# Patient Record
Sex: Female | Born: 1985 | Race: White | Hispanic: No | Marital: Married
Health system: Southern US, Academic
[De-identification: ages and names within clinical notes are randomized; demographics above are authoritative.]

## PROBLEM LIST (undated history)

## (undated) ENCOUNTER — Ambulatory Visit

## (undated) ENCOUNTER — Telehealth

## (undated) ENCOUNTER — Ambulatory Visit
Payer: BLUE CROSS/BLUE SHIELD | Attending: Student in an Organized Health Care Education/Training Program | Primary: Student in an Organized Health Care Education/Training Program

## (undated) ENCOUNTER — Ambulatory Visit: Payer: MEDICARE

## (undated) ENCOUNTER — Encounter
Attending: Student in an Organized Health Care Education/Training Program | Primary: Student in an Organized Health Care Education/Training Program

## (undated) ENCOUNTER — Encounter: Attending: Surgical | Primary: Surgical

## (undated) ENCOUNTER — Encounter

## (undated) ENCOUNTER — Ambulatory Visit: Payer: BLUE CROSS/BLUE SHIELD

## (undated) ENCOUNTER — Ambulatory Visit: Payer: PRIVATE HEALTH INSURANCE

## (undated) ENCOUNTER — Telehealth
Attending: Student in an Organized Health Care Education/Training Program | Primary: Student in an Organized Health Care Education/Training Program

## (undated) ENCOUNTER — Encounter: Attending: Anesthesiology | Primary: Anesthesiology

## (undated) ENCOUNTER — Encounter: Attending: Neurological Surgery | Primary: Neurological Surgery

## (undated) ENCOUNTER — Encounter
Attending: Rehabilitative and Restorative Service Providers" | Primary: Rehabilitative and Restorative Service Providers"

## (undated) ENCOUNTER — Encounter: Attending: Internal Medicine | Primary: Internal Medicine

## (undated) ENCOUNTER — Encounter: Attending: Family | Primary: Family

## (undated) ENCOUNTER — Encounter: Payer: PRIVATE HEALTH INSURANCE | Attending: Internal Medicine | Primary: Internal Medicine

## (undated) ENCOUNTER — Ambulatory Visit: Payer: MEDICARE | Attending: Anesthesiology | Primary: Anesthesiology

## (undated) ENCOUNTER — Ambulatory Visit: Payer: PRIVATE HEALTH INSURANCE | Attending: Family | Primary: Family

## (undated) ENCOUNTER — Encounter: Payer: PRIVATE HEALTH INSURANCE | Attending: Physician Assistant | Primary: Physician Assistant

## (undated) ENCOUNTER — Encounter: Attending: Specialist | Primary: Specialist

## (undated) ENCOUNTER — Encounter: Attending: Psychologist | Primary: Psychologist

## (undated) ENCOUNTER — Ambulatory Visit: Payer: PRIVATE HEALTH INSURANCE | Attending: Orthopaedic Surgery | Primary: Orthopaedic Surgery

## (undated) ENCOUNTER — Inpatient Hospital Stay: Payer: BLUE CROSS/BLUE SHIELD

## (undated) ENCOUNTER — Ambulatory Visit
Payer: PRIVATE HEALTH INSURANCE | Attending: Student in an Organized Health Care Education/Training Program | Primary: Student in an Organized Health Care Education/Training Program

## (undated) ENCOUNTER — Ambulatory Visit: Payer: MEDICARE | Attending: Psychologist | Primary: Psychologist

## (undated) ENCOUNTER — Ambulatory Visit: Payer: MEDICARE | Attending: Specialist | Primary: Specialist

## (undated) ENCOUNTER — Ambulatory Visit
Payer: PRIVATE HEALTH INSURANCE | Attending: Rehabilitative and Restorative Service Providers" | Primary: Rehabilitative and Restorative Service Providers"

## (undated) ENCOUNTER — Ambulatory Visit
Payer: Medicaid (Managed Care) | Attending: Physical Medicine & Rehabilitation | Primary: Physical Medicine & Rehabilitation

## (undated) ENCOUNTER — Ambulatory Visit: Payer: Medicaid (Managed Care)

## (undated) ENCOUNTER — Ambulatory Visit: Payer: BLUE CROSS/BLUE SHIELD | Attending: Specialist | Primary: Specialist

## (undated) ENCOUNTER — Telehealth: Attending: Specialist | Primary: Specialist

## (undated) ENCOUNTER — Ambulatory Visit: Payer: MEDICARE | Attending: Plastic and Reconstructive Surgery | Primary: Plastic and Reconstructive Surgery

## (undated) ENCOUNTER — Ambulatory Visit
Attending: Student in an Organized Health Care Education/Training Program | Primary: Student in an Organized Health Care Education/Training Program

## (undated) ENCOUNTER — Telehealth: Attending: Rheumatology | Primary: Rheumatology

## (undated) ENCOUNTER — Ambulatory Visit: Payer: MEDICARE | Attending: Family | Primary: Family

## (undated) ENCOUNTER — Encounter: Payer: PRIVATE HEALTH INSURANCE | Attending: Dermatology | Primary: Dermatology

## (undated) ENCOUNTER — Non-Acute Institutional Stay
Payer: PRIVATE HEALTH INSURANCE | Attending: Plastic and Reconstructive Surgery | Primary: Plastic and Reconstructive Surgery

## (undated) ENCOUNTER — Ambulatory Visit: Attending: Physical Medicine & Rehabilitation | Primary: Physical Medicine & Rehabilitation

## (undated) ENCOUNTER — Encounter: Payer: PRIVATE HEALTH INSURANCE | Attending: Otolaryngology | Primary: Otolaryngology

## (undated) ENCOUNTER — Encounter
Payer: PRIVATE HEALTH INSURANCE | Attending: Plastic and Reconstructive Surgery | Primary: Plastic and Reconstructive Surgery

## (undated) ENCOUNTER — Encounter
Payer: PRIVATE HEALTH INSURANCE | Attending: Rehabilitative and Restorative Service Providers" | Primary: Rehabilitative and Restorative Service Providers"

## (undated) ENCOUNTER — Telehealth: Attending: Surgical | Primary: Surgical

## (undated) ENCOUNTER — Encounter: Attending: Nurse Practitioner | Primary: Nurse Practitioner

## (undated) ENCOUNTER — Encounter: Attending: Urology | Primary: Urology

## (undated) DIAGNOSIS — N814 Uterovaginal prolapse, unspecified: Secondary | ICD-10-CM

## (undated) DIAGNOSIS — Z3490 Encounter for supervision of normal pregnancy, unspecified, unspecified trimester: Principal | ICD-10-CM

## (undated) DIAGNOSIS — I499 Cardiac arrhythmia, unspecified: Secondary | ICD-10-CM

## (undated) DIAGNOSIS — I471 Supraventricular tachycardia, unspecified: Secondary | ICD-10-CM

## (undated) DIAGNOSIS — E039 Hypothyroidism, unspecified: Secondary | ICD-10-CM

## (undated) DIAGNOSIS — D8940 Mast cell activation, unspecified: Secondary | ICD-10-CM

## (undated) DIAGNOSIS — Q796 Ehlers-Danlos syndrome, unspecified: Secondary | ICD-10-CM

## (undated) DIAGNOSIS — G90A Postural orthostatic tachycardia syndrome (POTS): Secondary | ICD-10-CM

## (undated) DIAGNOSIS — L4050 Arthropathic psoriasis, unspecified: Secondary | ICD-10-CM

## (undated) DIAGNOSIS — J189 Pneumonia, unspecified organism: Secondary | ICD-10-CM

## (undated) DIAGNOSIS — N368 Other specified disorders of urethra: Secondary | ICD-10-CM

## (undated) DIAGNOSIS — R102 Pelvic and perineal pain: Principal | ICD-10-CM

## (undated) DIAGNOSIS — G540 Brachial plexus disorders: Secondary | ICD-10-CM

## (undated) DIAGNOSIS — F32A Depression, unspecified: Secondary | ICD-10-CM

## (undated) DIAGNOSIS — R87629 Unspecified abnormal cytological findings in specimens from vagina: Secondary | ICD-10-CM

## (undated) DIAGNOSIS — M1990 Unspecified osteoarthritis, unspecified site: Secondary | ICD-10-CM

## (undated) DIAGNOSIS — E063 Autoimmune thyroiditis: Secondary | ICD-10-CM

## (undated) DIAGNOSIS — N816 Rectocele: Secondary | ICD-10-CM

## (undated) DIAGNOSIS — G43909 Migraine, unspecified, not intractable, without status migrainosus: Secondary | ICD-10-CM

## (undated) DIAGNOSIS — T8859XA Other complications of anesthesia, initial encounter: Secondary | ICD-10-CM

## (undated) DIAGNOSIS — N8110 Cystocele, unspecified: Secondary | ICD-10-CM

## (undated) DIAGNOSIS — J309 Allergic rhinitis, unspecified: Secondary | ICD-10-CM

## (undated) DIAGNOSIS — R1310 Dysphagia, unspecified: Secondary | ICD-10-CM

## (undated) DIAGNOSIS — J45909 Unspecified asthma, uncomplicated: Secondary | ICD-10-CM

## (undated) DIAGNOSIS — E079 Disorder of thyroid, unspecified: Secondary | ICD-10-CM

## (undated) DIAGNOSIS — O26892 Other specified pregnancy related conditions, second trimester: Principal | ICD-10-CM

## (undated) DIAGNOSIS — K2970 Gastritis, unspecified, without bleeding: Secondary | ICD-10-CM

## (undated) DIAGNOSIS — D649 Anemia, unspecified: Secondary | ICD-10-CM

## (undated) DIAGNOSIS — IMO0002 Reserved for concepts with insufficient information to code with codable children: Secondary | ICD-10-CM

## (undated) DIAGNOSIS — K219 Gastro-esophageal reflux disease without esophagitis: Secondary | ICD-10-CM

## (undated) DIAGNOSIS — M3500 Sicca syndrome, unspecified: Secondary | ICD-10-CM

## (undated) DIAGNOSIS — M329 Systemic lupus erythematosus, unspecified: Secondary | ICD-10-CM

## (undated) HISTORY — DX: Cystocele, unspecified: N81.10

## (undated) HISTORY — DX: Encounter for supervision of normal pregnancy, unspecified, unspecified trimester: Z34.90

## (undated) HISTORY — DX: Unspecified asthma, uncomplicated: J45.909

## (undated) HISTORY — DX: Supraventricular tachycardia: I47.1

## (undated) HISTORY — DX: Other specified pregnancy related conditions, second trimester: O26.892

## (undated) HISTORY — DX: Rectocele: N81.6

## (undated) HISTORY — PX: ANTERIOR AND POSTERIOR VAGINAL REPAIR: SUR5

## (undated) HISTORY — DX: Other specified disorders of urethra: N36.8

## (undated) HISTORY — PX: ABDOMINAL HYSTERECTOMY: SHX81

## (undated) HISTORY — DX: Allergic rhinitis, unspecified: J30.9

## (undated) HISTORY — PX: WRIST GANGLION EXCISION: SUR520

## (undated) HISTORY — DX: Dysphagia, unspecified: R13.10

## (undated) HISTORY — DX: Uterovaginal prolapse, unspecified: N81.4

## (undated) HISTORY — DX: Supraventricular tachycardia, unspecified: I47.10

## (undated) HISTORY — PX: ANKLE SURGERY: SHX546

## (undated) HISTORY — DX: Unspecified abnormal cytological findings in specimens from vagina: R87.629

## (undated) HISTORY — DX: Pelvic and perineal pain: R10.2

## (undated) MED ORDER — CLOBETASOL PROPIONATE (BULK) MISC: 0 days

## (undated) MED ORDER — FOLIC ACID 1 MG TABLET: 0 days

## (undated) MED ORDER — BUPROPION HCL XL 150 MG 24 HR TABLET, EXTENDED RELEASE: Freq: Every day | ORAL | 0.00000 days

## (undated) MED ORDER — ALBUTEROL INHL: 0 days

## (undated) MED ORDER — CHOLECALCIFEROL (VITAMIN D3) ORAL: 0 days

## (undated) MED ORDER — NAPROXEN 500 MG TABLET: Freq: Every day | ORAL | 0 days

## (undated) MED ORDER — METOCLOPRAMIDE HCL ORAL: 0 days

## (undated) MED ORDER — OMEPRAZOLE 20 MG CAPSULE,DELAYED RELEASE: Freq: Every day | ORAL | 0 days

## (undated) MED ORDER — CALCIUM CARB-VIT D3-MINERALS 600 MG CALCIUM-400 UNIT TABLET: 0 days

---

## 2012-01-28 ENCOUNTER — Encounter: Payer: Self-pay | Admitting: Internal Medicine

## 2013-07-30 ENCOUNTER — Ambulatory Visit (HOSPITAL_COMMUNITY): Payer: BC Managed Care – PPO | Admitting: Psychology

## 2013-10-15 ENCOUNTER — Other Ambulatory Visit: Payer: Self-pay | Admitting: Obstetrics & Gynecology

## 2013-10-15 DIAGNOSIS — O3680X0 Pregnancy with inconclusive fetal viability, not applicable or unspecified: Secondary | ICD-10-CM

## 2013-10-18 ENCOUNTER — Encounter: Payer: Self-pay | Admitting: Adult Health

## 2013-10-18 ENCOUNTER — Encounter: Payer: Self-pay | Admitting: Obstetrics & Gynecology

## 2013-10-18 ENCOUNTER — Ambulatory Visit (INDEPENDENT_AMBULATORY_CARE_PROVIDER_SITE_OTHER): Payer: BC Managed Care – PPO

## 2013-10-18 ENCOUNTER — Other Ambulatory Visit: Payer: Self-pay | Admitting: Obstetrics & Gynecology

## 2013-10-18 DIAGNOSIS — O3680X0 Pregnancy with inconclusive fetal viability, not applicable or unspecified: Secondary | ICD-10-CM

## 2013-10-18 DIAGNOSIS — O358XX0 Maternal care for other (suspected) fetal abnormality and damage, not applicable or unspecified: Secondary | ICD-10-CM

## 2013-10-18 NOTE — Progress Notes (Signed)
U/S-single IUP with +FCA noted FHR-102bpm, CRL c/w 5+4 wks EDD 06/16/2014, cx long and closed, bilateral adnexa wnl with C.L. Noted on LT

## 2013-10-19 ENCOUNTER — Other Ambulatory Visit: Payer: Self-pay

## 2013-10-20 ENCOUNTER — Other Ambulatory Visit: Payer: Self-pay

## 2013-10-25 ENCOUNTER — Other Ambulatory Visit: Payer: Self-pay | Admitting: Obstetrics & Gynecology

## 2013-10-25 DIAGNOSIS — O3680X0 Pregnancy with inconclusive fetal viability, not applicable or unspecified: Secondary | ICD-10-CM

## 2013-10-27 ENCOUNTER — Encounter: Payer: BC Managed Care – PPO | Admitting: Advanced Practice Midwife

## 2013-10-27 ENCOUNTER — Other Ambulatory Visit: Payer: BC Managed Care – PPO

## 2013-11-02 ENCOUNTER — Encounter: Payer: BC Managed Care – PPO | Admitting: Adult Health

## 2013-11-02 ENCOUNTER — Other Ambulatory Visit: Payer: BC Managed Care – PPO

## 2013-11-09 ENCOUNTER — Other Ambulatory Visit (HOSPITAL_COMMUNITY)
Admission: RE | Admit: 2013-11-09 | Discharge: 2013-11-09 | Disposition: A | Discharge status: Home / Self Care | Payer: BC Managed Care – PPO | Source: Ambulatory Visit | Attending: Obstetrics and Gynecology | Admitting: Obstetrics and Gynecology

## 2013-11-09 ENCOUNTER — Encounter: Payer: Self-pay | Admitting: Advanced Practice Midwife

## 2013-11-09 ENCOUNTER — Encounter (INDEPENDENT_AMBULATORY_CARE_PROVIDER_SITE_OTHER): Payer: Self-pay

## 2013-11-09 ENCOUNTER — Ambulatory Visit (INDEPENDENT_AMBULATORY_CARE_PROVIDER_SITE_OTHER): Payer: BC Managed Care – PPO

## 2013-11-09 ENCOUNTER — Ambulatory Visit (INDEPENDENT_AMBULATORY_CARE_PROVIDER_SITE_OTHER): Payer: BC Managed Care – PPO | Admitting: Advanced Practice Midwife

## 2013-11-09 VITALS — BP 114/76 | Ht 64.0 in | Wt 154.0 lb

## 2013-11-09 DIAGNOSIS — O09899 Supervision of other high risk pregnancies, unspecified trimester: Secondary | ICD-10-CM | POA: Insufficient documentation

## 2013-11-09 DIAGNOSIS — J45909 Unspecified asthma, uncomplicated: Secondary | ICD-10-CM

## 2013-11-09 DIAGNOSIS — Z01419 Encounter for gynecological examination (general) (routine) without abnormal findings: Secondary | ICD-10-CM | POA: Insufficient documentation

## 2013-11-09 DIAGNOSIS — I471 Supraventricular tachycardia, unspecified: Secondary | ICD-10-CM | POA: Insufficient documentation

## 2013-11-09 DIAGNOSIS — O3680X0 Pregnancy with inconclusive fetal viability, not applicable or unspecified: Secondary | ICD-10-CM

## 2013-11-09 DIAGNOSIS — Z3480 Encounter for supervision of other normal pregnancy, unspecified trimester: Secondary | ICD-10-CM

## 2013-11-09 DIAGNOSIS — Z331 Pregnant state, incidental: Secondary | ICD-10-CM

## 2013-11-09 DIAGNOSIS — Z1389 Encounter for screening for other disorder: Secondary | ICD-10-CM

## 2013-11-09 DIAGNOSIS — O360990 Maternal care for other rhesus isoimmunization, unspecified trimester, not applicable or unspecified: Secondary | ICD-10-CM

## 2013-11-09 DIAGNOSIS — Z3490 Encounter for supervision of normal pregnancy, unspecified, unspecified trimester: Secondary | ICD-10-CM

## 2013-11-09 LAB — POCT URINALYSIS DIPSTICK
Glucose, UA: NEGATIVE
Leukocytes, UA: NEGATIVE
Nitrite, UA: NEGATIVE
Protein, UA: NEGATIVE

## 2013-11-09 LAB — CBC
Hemoglobin: 12.8 g/dL (ref 12.0–15.0)
MCHC: 34.4 g/dL (ref 30.0–36.0)
Platelets: 204 10*3/uL (ref 150–400)
RBC: 4.66 MIL/uL (ref 3.87–5.11)
WBC: 6.3 10*3/uL (ref 4.0–10.5)

## 2013-11-09 LAB — RPR

## 2013-11-09 MED ORDER — LABETALOL HCL 100 MG PO TABS: 100.0000 mg | ORAL_TABLET | Freq: Two times a day (BID) | ORAL | Status: DC

## 2013-11-09 NOTE — Progress Notes (Signed)
Subjective:    Sharon Rowland is a [id number] [redacted]w[redacted]d being seen oday for her first obstetrical visit.  Pregnancy history fully reviewed. She states that with her last child, "he wasn't coming down" so forceps were used, and someone told her if he had been any bigger he wouldn't have come out.  No shoulder dystocia.    Patient reports backache. In her lower back.  She has been off her lopresser for SVT for a few weeks, and has daily runs ~ 170bpm. Filed Vitals:   11/09/13 1032 11/09/13 1035  BP: 114/76   Height:  5\' 4"  (1.626 m)  Weight: 154 lb (69.854 kg)     HISTORY: OB History  Gravida Para Term Preterm AB SAB TAB Ectopic Multiple Living  4 3 3       3     # Outcome Date GA Lbr Len/2nd Weight Sex Delivery Anes PTL Lv  4 CUR           3 TRM 12/19/06 [redacted]w[redacted]d  7 lb 8 oz (3.402 kg) M SVD EPI  Y     Comments: forceps "Told not to have a baby bigger than this"  2 TRM 10/02/05 [redacted]w[redacted]d  7 lb 2 oz (3.232 kg) M SVD EPI  Y  1 TRM 10/23/03 [redacted]w[redacted]d  6 lb 3 oz (2.807 kg) F SVD EPI  Y     Comments: PROM with septicemia (hospitalized for 1 week)     Past Medical History  Diagnosis Date  . PSVT (paroxysmal supraventricular tachycardia)   . Asthma   . Allergic rhinitis    History reviewed. No pertinent past surgical history. Family History  Problem Relation Age of Onset  . Hypertension Mother   . Hyperlipidemia Mother   . Multiple sclerosis Mother   . COPD Mother   . Cancer Mother     melanoma x 2  . Diabetes Mother   . Heart disease Father     PSVT  . Hyperlipidemia Brother   . Hypertension Brother   . Heart disease Maternal Grandfather     heart attack  . Cancer Paternal Grandmother     pancreatic cancer  . Heart disease Paternal Grandmother     MI  . Hyperlipidemia Paternal Grandmother   . Hypertension Paternal Grandmother      Exam       Pelvic Exam:    Perineum: Normal Perineum   Vulva: normal   Vagina:  normal mucosa, normal discharge, no palpable nodules   Uterus          Cervix: normal   Adnexa: Not palpable   Urinary:  urethral meatus normal    System: Breast:  normal appearance, no masses or tenderness   Skin: normal coloration and turgor, no rashes    Neurologic: oriented, normal, normal mood   Extremities: normal strength, tone, and muscle mass   HEENT PERRLA   Mouth/Teeth mucous membranes moist, pharynx normal without lesions   Neck supple and no masses   Cardiovascular: regular rate and rhythm   Respiratory:  appears well, vitals normal, no respiratory distress, acyanotic, normal RR   Abdomen: soft, non-tender; bowel sounds normal; no masses,  no organomegaly  FHR 180 u/s        Assessment:    Pregnancy: [id number] Patient Active Problem List   Diagnosis Date Noted  . Paroxysmal SVT (supraventricular tachycardia) 11/09/2013  . Asthma 11/09/2013  . Pregnant 11/09/2013        Plan:     Initial labs drawn.  Prenatal vitamins and Labetolol 100mg  BID given Problem list reviewed and updated. Genetic Screening discussed Integrated Screen: requested.  Ultrasound discussed; fetal survey: requested.  Follow up in 4 weeks.  CRESENZO-DISHMAN,Avril Busser 11/09/2013

## 2013-11-09 NOTE — Progress Notes (Signed)
U/S(8+5wks)-single IUP with +FCA FHR-186 bpm, cx long and closed (3.3cm), bilateral adnexa WNL, CRL c/w dates, no free fluid noted

## 2013-11-09 NOTE — Progress Notes (Deleted)
  Subjective:    Sharon Rowland is a G1P0 [redacted]w[redacted]d being seen today for her first obstetrical visit.  Her obstetrical history is significant for {ob risk factors:10154}.  Pregnancy history fully reviewed.  Patient reports {sx:14538}.  There were no vitals filed for this visit.  HISTORY: OB History  Gravida Para Term Preterm AB SAB TAB Ectopic Multiple Living  1             # Outcome Date GA Lbr Len/2nd Weight Sex Delivery Anes PTL Lv  1 CUR              No past medical history on file. No past surgical history on file. No family history on file.   Exam       Pelvic Exam:    Perineum: Normal Perineum   Vulva: normal   Vagina:  normal mucosa, normal discharge, no palpable nodules   Uterus         Cervix: normal   Adnexa: Not palpable   Urinary:  urethral meatus normal    System: Breast:  normal appearance, no masses or tenderness   Skin: normal coloration and turgor, no rashes    Neurologic: oriented, normal, normal mood   Extremities: normal strength, tone, and muscle mass   HEENT PERRLA   Mouth/Teeth mucous membranes moist, pharynx normal without lesions   Neck supple and no masses   Cardiovascular: regular rate and rhythm   Respiratory:  appears well, vitals normal, no respiratory distress, acyanotic, normal RR   Abdomen: soft, non-tender; bowel sounds normal; no masses,  no organomegaly          Assessment:    Pregnancy: G1P0 There are no active problems to display for this patient.       Plan:     Initial labs drawn. Prenatal vitamins. Problem list reviewed and updated. Genetic Screening discussed {GENETIC SCREENING TEST:22046}: {requests/ordered/declines:14581}.  Ultrasound discussed; fetal survey: {requests/ordered/declines:14581}.  Follow up in {numbers 0-4:31231} weeks.  Elpidio Eric Joanie Coddington 11/09/2013

## 2013-11-10 LAB — HIV ANTIBODY (ROUTINE TESTING W REFLEX): HIV: NONREACTIVE

## 2013-11-10 LAB — URINALYSIS, ROUTINE W REFLEX MICROSCOPIC
Bilirubin Urine: NEGATIVE
Glucose, UA: NEGATIVE mg/dL
Hgb urine dipstick: NEGATIVE
Ketones, ur: 15 mg/dL — AB
Protein, ur: NEGATIVE mg/dL

## 2013-11-10 LAB — DRUG SCREEN, URINE, NO CONFIRMATION
Amphetamine Screen, Ur: NEGATIVE
Barbiturate Quant, Ur: NEGATIVE
Benzodiazepines.: NEGATIVE
Cocaine Metabolites: NEGATIVE
Marijuana Metabolite: NEGATIVE
Methadone: NEGATIVE
Opiate Screen, Urine: NEGATIVE

## 2013-11-10 LAB — ANTIBODY SCREEN: Antibody Screen: NEGATIVE

## 2013-11-10 LAB — OXYCODONE SCREEN, UA, RFLX CONFIRM: Oxycodone Screen, Ur: NEGATIVE ng/mL

## 2013-11-10 LAB — ABO AND RH

## 2013-11-10 LAB — GC/CHLAMYDIA PROBE AMP: GC Probe RNA: NEGATIVE

## 2013-11-12 LAB — VARICELLA ZOSTER ANTIBODY, IGG: Varicella IgG: 787.8 Index — ABNORMAL HIGH (ref <135.00)

## 2013-11-12 LAB — RUBELLA SCREEN: Rubella: 1.92 Index — ABNORMAL HIGH (ref <0.90)

## 2013-11-17 ENCOUNTER — Encounter: Payer: Self-pay | Admitting: Advanced Practice Midwife

## 2013-11-17 ENCOUNTER — Telehealth: Payer: Self-pay | Admitting: Adult Health

## 2013-11-17 DIAGNOSIS — Z3490 Encounter for supervision of normal pregnancy, unspecified, unspecified trimester: Secondary | ICD-10-CM

## 2013-11-17 NOTE — Telephone Encounter (Signed)
Left message x 1. JSY 

## 2013-11-23 NOTE — Telephone Encounter (Signed)
Pt informed of abnormal pap (LSIL), no growth on urine culture all other labs normal. Pt states has had abnormal pap in the past, will discuss with Rodena Piety, CNM at next appt. Pt states "thought she had UTI continues to have discomfort with urination," encouraged pt to push water and cranberry juice since CX negative, if no improvement call office back. Pt verbalized understanding.

## 2013-11-26 ENCOUNTER — Telehealth: Payer: Self-pay | Admitting: Obstetrics and Gynecology

## 2013-11-29 NOTE — Telephone Encounter (Signed)
Ignore the note on the thyroid biopsy, I had 2 charts "open" and mistakenly put that message onthis patient's chart   Tell pt that lortab and flexeril would both be appropriate and an MRI any time is ok, not a problem any time in pregnancy

## 2013-11-29 NOTE — Telephone Encounter (Signed)
Pt states that she was injured at work and has a lumbar strain. Ortho wants to know what medications she can take, pain, and steroids. And when can she have an MRI .

## 2013-11-29 NOTE — Telephone Encounter (Signed)
I called pt and have scheduled a thyroid biopsy  No further call back is needed

## 2013-11-30 NOTE — Telephone Encounter (Signed)
Pt aware of Dr. Forestine Chute note.

## 2013-12-01 ENCOUNTER — Encounter: Payer: Self-pay | Admitting: Obstetrics & Gynecology

## 2013-12-01 ENCOUNTER — Telehealth: Payer: Self-pay | Admitting: Obstetrics & Gynecology

## 2013-12-01 NOTE — Telephone Encounter (Signed)
Letter done

## 2013-12-08 ENCOUNTER — Other Ambulatory Visit: Payer: Self-pay | Admitting: Advanced Practice Midwife

## 2013-12-08 ENCOUNTER — Encounter: Payer: Self-pay | Admitting: Advanced Practice Midwife

## 2013-12-08 ENCOUNTER — Ambulatory Visit (INDEPENDENT_AMBULATORY_CARE_PROVIDER_SITE_OTHER): Payer: BC Managed Care – PPO | Admitting: Advanced Practice Midwife

## 2013-12-08 ENCOUNTER — Ambulatory Visit (INDEPENDENT_AMBULATORY_CARE_PROVIDER_SITE_OTHER): Payer: BC Managed Care – PPO

## 2013-12-08 VITALS — BP 112/60 | Wt 155.0 lb

## 2013-12-08 DIAGNOSIS — Z331 Pregnant state, incidental: Secondary | ICD-10-CM

## 2013-12-08 DIAGNOSIS — Z36 Encounter for antenatal screening of mother: Secondary | ICD-10-CM

## 2013-12-08 DIAGNOSIS — Z1389 Encounter for screening for other disorder: Secondary | ICD-10-CM

## 2013-12-08 DIAGNOSIS — O360990 Maternal care for other rhesus isoimmunization, unspecified trimester, not applicable or unspecified: Secondary | ICD-10-CM

## 2013-12-08 DIAGNOSIS — I471 Supraventricular tachycardia: Secondary | ICD-10-CM

## 2013-12-08 DIAGNOSIS — Z3480 Encounter for supervision of other normal pregnancy, unspecified trimester: Secondary | ICD-10-CM

## 2013-12-08 DIAGNOSIS — Z01419 Encounter for gynecological examination (general) (routine) without abnormal findings: Secondary | ICD-10-CM

## 2013-12-08 LAB — POCT URINALYSIS DIPSTICK
Ketones, UA: NEGATIVE
Leukocytes, UA: NEGATIVE
Nitrite, UA: NEGATIVE
Protein, UA: NEGATIVE

## 2013-12-08 NOTE — Progress Notes (Signed)
Had NT/IT today.  Feels like she needs to increase meds for SVT.  Increase to Labetolol 200mg  BID (from 100BID).  F/u 4 weekds for 2nd IT/LROB

## 2013-12-08 NOTE — Progress Notes (Signed)
U/S(12+6wks)- transabdominal u/s performed, single IUP with +FCA noted, FHR-165 bpm, CRL c/w dates, cx long and closed (3.3cm), bilateral adnexa WNL, NB present, NT-1.58mm

## 2013-12-14 LAB — MATERNAL SCREEN, INTEGRATED #1

## 2013-12-23 NOTE — L&D Delivery Note (Signed)
Delivery Note At 10:50 AM a viable female was delivered via Vaginal, Spontaneous Delivery (Presentation: Left Occiput Anterior).  APGAR:pending at time of note; weight: TBD.  NICU team was called to be in attendance of birth d/t moderate MSF, however infant birthed just prior to their arrival and was spontaneously vigorous. W/ birth of head & again w/ birth of body, there were many small dark clots, suspicious of concealed partial marginal abruption. Loose nuchal cord x 1 reduced after birth of body. Infant placed directly on mom's abdomen for bonding/skin-to-skin. Cord allowed to stop pulsing, and was clamped x 2, and cut by fob.   Placenta status: Intact, Spontaneous.  Cord: 3 vessels with the following complications: None.    Anesthesia: Epidural  Episiotomy: None Lacerations: small hemostatic periurethral abrasions, no repair Suture Repair: n/a Est. Blood Loss (mL): 300  Mom to postpartum.  Baby to Couplet care / Skin to Skin. Plans to breastfeed, Mirena for contraception, in-hospital circ  Marge Duncans 06/18/2014, 11:10 AM

## 2013-12-27 ENCOUNTER — Encounter: Payer: Self-pay | Admitting: Obstetrics & Gynecology

## 2013-12-27 ENCOUNTER — Ambulatory Visit: Payer: BC Managed Care – PPO | Admitting: Obstetrics & Gynecology

## 2013-12-27 ENCOUNTER — Ambulatory Visit (INDEPENDENT_AMBULATORY_CARE_PROVIDER_SITE_OTHER): Payer: BC Managed Care – PPO | Admitting: Obstetrics & Gynecology

## 2013-12-27 ENCOUNTER — Other Ambulatory Visit: Payer: Self-pay | Admitting: Obstetrics & Gynecology

## 2013-12-27 VITALS — BP 110/60 | Ht 64.0 in | Wt 155.0 lb

## 2013-12-27 VITALS — BP 115/70 | Wt 155.0 lb

## 2013-12-27 DIAGNOSIS — Z3482 Encounter for supervision of other normal pregnancy, second trimester: Secondary | ICD-10-CM

## 2013-12-27 DIAGNOSIS — Z1389 Encounter for screening for other disorder: Secondary | ICD-10-CM

## 2013-12-27 DIAGNOSIS — Z331 Pregnant state, incidental: Secondary | ICD-10-CM

## 2013-12-27 DIAGNOSIS — N870 Mild cervical dysplasia: Secondary | ICD-10-CM

## 2013-12-27 DIAGNOSIS — R87612 Low grade squamous intraepithelial lesion on cytologic smear of cervix (LGSIL): Secondary | ICD-10-CM

## 2013-12-27 LAB — POCT URINALYSIS DIPSTICK
Blood, UA: NEGATIVE
Glucose, UA: NEGATIVE
Ketones, UA: NEGATIVE
Leukocytes, UA: NEGATIVE
NITRITE UA: NEGATIVE

## 2013-12-27 NOTE — Progress Notes (Signed)
Colposcopy today is normal, no acetowhite epithelium, no punctation or other abnormalities Repeat 8 weeks post partum  BP weight and urine results all reviewed and noted. Patient reports good fetal movement, denies any bleeding and no rupture of membranes symptoms or regular contractions. Patient is without complaints. All questions were answered.

## 2014-01-01 LAB — MATERNAL SCREEN, INTEGRATED #2
AFP MoM: 1.23
AFP, SERUM MAT SCREEN: 41.9 ng/mL
Age risk Down Syndrome: 1:880 {titer}
CALCULATED GESTATIONAL AGE MAT SCREEN: 15.6
Crown Rump Length: 68.2 mm
ESTRIOL FREE MAT SCREEN: 0.59 ng/mL
Estriol Mom: 0.71
HCG, MOM MAT SCREEN: 2.2
Inhibin A Dimeric: 114 pg/mL
Inhibin A MoM: 0.64
MSS Down Syndrome: 1:4400 {titer}
MSS Trisomy 18 Risk: <1:5000 {titer}
NT MOM MAT SCREEN: 1.03
Nuchal Translucency: 1.57 mm
Number of fetuses: 1
PAPP-A MoM: 0.98
PAPP-A: 980 ng/mL
Rish for ONTD: 1:3700 {titer}
hCG, Serum: 78.8 IU/mL

## 2014-01-05 ENCOUNTER — Encounter: Payer: BC Managed Care – PPO | Admitting: Advanced Practice Midwife

## 2014-01-17 ENCOUNTER — Encounter: Payer: Self-pay | Admitting: Adult Health

## 2014-01-17 ENCOUNTER — Ambulatory Visit (INDEPENDENT_AMBULATORY_CARE_PROVIDER_SITE_OTHER): Payer: BC Managed Care – PPO | Admitting: Adult Health

## 2014-01-17 VITALS — BP 120/76 | Wt 158.0 lb

## 2014-01-17 DIAGNOSIS — O9989 Other specified diseases and conditions complicating pregnancy, childbirth and the puerperium: Secondary | ICD-10-CM

## 2014-01-17 DIAGNOSIS — R102 Pelvic and perineal pain: Principal | ICD-10-CM

## 2014-01-17 DIAGNOSIS — O26892 Other specified pregnancy related conditions, second trimester: Secondary | ICD-10-CM

## 2014-01-17 DIAGNOSIS — N949 Unspecified condition associated with female genital organs and menstrual cycle: Secondary | ICD-10-CM

## 2014-01-17 DIAGNOSIS — O360990 Maternal care for other rhesus isoimmunization, unspecified trimester, not applicable or unspecified: Secondary | ICD-10-CM

## 2014-01-17 HISTORY — DX: Other specified pregnancy related conditions, second trimester: R10.2

## 2014-01-17 HISTORY — DX: Other specified pregnancy related conditions, second trimester: O26.892

## 2014-01-17 NOTE — Progress Notes (Signed)
Had sex this am with intense pain then cramping no bleeding, on exam, cervix closed, no bleeding then US showed active baby with good fluid volume, no ovarian cyst seen, no free fluid cervix closed and long, its a boy,discussed with K Booker CNM will give note to take today off, return in 2 weeks for [place], but if nay increase pain, bleeding or fever ,call us back.No sex today.

## 2014-01-17 NOTE — Patient Instructions (Signed)
Push fluids and rest  Return as scheduled Call prn

## 2014-01-17 NOTE — Progress Notes (Signed)
Pt states that she is having cramping that started at 7 this morning. Pt states that when the pain started it was really intense and painful almost like a contraction, now she is just having the cramping. Pt denies any bleeding or gush of fluid.

## 2014-01-25 ENCOUNTER — Other Ambulatory Visit: Payer: BC Managed Care – PPO

## 2014-01-26 ENCOUNTER — Other Ambulatory Visit: Payer: BC Managed Care – PPO

## 2014-01-26 ENCOUNTER — Encounter: Payer: BC Managed Care – PPO | Admitting: Women's Health

## 2014-01-31 ENCOUNTER — Encounter: Payer: Self-pay | Admitting: Women's Health

## 2014-01-31 ENCOUNTER — Ambulatory Visit (INDEPENDENT_AMBULATORY_CARE_PROVIDER_SITE_OTHER): Payer: BC Managed Care – PPO

## 2014-01-31 ENCOUNTER — Ambulatory Visit (INDEPENDENT_AMBULATORY_CARE_PROVIDER_SITE_OTHER): Payer: BC Managed Care – PPO | Admitting: Women's Health

## 2014-01-31 ENCOUNTER — Other Ambulatory Visit: Payer: Self-pay | Admitting: Obstetrics & Gynecology

## 2014-01-31 VITALS — BP 104/50 | Wt 161.5 lb

## 2014-01-31 DIAGNOSIS — R87619 Unspecified abnormal cytological findings in specimens from cervix uteri: Secondary | ICD-10-CM

## 2014-01-31 DIAGNOSIS — I471 Supraventricular tachycardia: Secondary | ICD-10-CM

## 2014-01-31 DIAGNOSIS — Z1389 Encounter for screening for other disorder: Secondary | ICD-10-CM

## 2014-01-31 DIAGNOSIS — Z3482 Encounter for supervision of other normal pregnancy, second trimester: Secondary | ICD-10-CM

## 2014-01-31 DIAGNOSIS — Z01419 Encounter for gynecological examination (general) (routine) without abnormal findings: Secondary | ICD-10-CM

## 2014-01-31 DIAGNOSIS — O26899 Other specified pregnancy related conditions, unspecified trimester: Secondary | ICD-10-CM

## 2014-01-31 DIAGNOSIS — Z3480 Encounter for supervision of other normal pregnancy, unspecified trimester: Secondary | ICD-10-CM

## 2014-01-31 DIAGNOSIS — Z6791 Unspecified blood type, Rh negative: Secondary | ICD-10-CM

## 2014-01-31 DIAGNOSIS — O360990 Maternal care for other rhesus isoimmunization, unspecified trimester, not applicable or unspecified: Secondary | ICD-10-CM

## 2014-01-31 DIAGNOSIS — Z331 Pregnant state, incidental: Secondary | ICD-10-CM

## 2014-01-31 DIAGNOSIS — Z3490 Encounter for supervision of normal pregnancy, unspecified, unspecified trimester: Secondary | ICD-10-CM

## 2014-01-31 LAB — POCT URINALYSIS DIPSTICK
Blood, UA: NEGATIVE
Glucose, UA: NEGATIVE
KETONES UA: NEGATIVE
Leukocytes, UA: NEGATIVE
NITRITE UA: NEGATIVE
PROTEIN UA: NEGATIVE

## 2014-01-31 MED ORDER — METOPROLOL TARTRATE 50 MG PO TABS: 50.0000 mg | ORAL_TABLET | Freq: Two times a day (BID) | ORAL | Status: DC

## 2014-01-31 NOTE — Progress Notes (Signed)
Reports good fm. Denies uc's, lof, vb, uti s/s.  Reports that she stopped labetalol 200mg  bid ~4wks ago b/c it was dropping her bp and wasn't controlling her SVT. Now reports SVT affecting work/sleep. Discussed w/ [facility], to resume lopressor 50mg  BID. Refilled per pt request. Discussed r/f IUGR w/ pt, and will get growth u/s q 4wks beginning at 28wks.   Reviewed today's u/s, ptl s/s, fm.  All questions answered. F/U in 4wks for visit.

## 2014-01-31 NOTE — Patient Instructions (Signed)
Second Trimester of Pregnancy The second trimester is from week 13 through week 28, months 4 through 6. The second trimester is often a time when you feel your best. Your body has also adjusted to being pregnant, and you begin to feel better physically. Usually, morning sickness has lessened or quit completely, you may have more energy, and you may have an increase in appetite. The second trimester is also a time when the fetus is growing rapidly. At the end of the sixth month, the fetus is about 9 inches long and weighs about 1 pounds. You will likely begin to feel the baby move (quickening) between 18 and 20 weeks of the pregnancy. BODY CHANGES Your body goes through many changes during pregnancy. The changes vary from woman to woman.   Your weight will continue to increase. You will notice your lower abdomen bulging out.  You may begin to get stretch marks on your hips, abdomen, and breasts.  You may develop headaches that can be relieved by medicines approved by your caregiver.  You may urinate more often because the fetus is pressing on your bladder.  You may develop or continue to have heartburn as a result of your pregnancy.  You may develop constipation because certain hormones are causing the muscles that push waste through your intestines to slow down.  You may develop hemorrhoids or swollen, bulging veins (varicose veins).  You may have back pain because of the weight gain and pregnancy hormones relaxing your joints between the bones in your pelvis and as a result of a shift in weight and the muscles that support your balance.  Your breasts will continue to grow and be tender.  Your gums may bleed and may be sensitive to brushing and flossing.  Dark spots or blotches (chloasma, mask of pregnancy) may develop on your face. This will likely fade after the baby is born.  A dark line from your belly button to the pubic area (linea nigra) may appear. This will likely fade after the  baby is born. WHAT TO EXPECT AT YOUR PRENATAL VISITS During a routine prenatal visit:  You will be weighed to make sure you and the fetus are growing normally.  Your blood pressure will be taken.  Your abdomen will be measured to track your baby's growth.  The fetal heartbeat will be listened to.  Any test results from the previous visit will be discussed. Your caregiver may ask you:  How you are feeling.  If you are feeling the baby move.  If you have had any abnormal symptoms, such as leaking fluid, bleeding, severe headaches, or abdominal cramping.  If you have any questions. Other tests that may be performed during your second trimester include:  Blood tests that check for:  Low iron levels (anemia).  Gestational diabetes (between 24 and 28 weeks).  Rh antibodies.  Urine tests to check for infections, diabetes, or protein in the urine.  An ultrasound to confirm the proper growth and development of the baby.  An amniocentesis to check for possible genetic problems.  Fetal screens for spina bifida and Down syndrome. HOME CARE INSTRUCTIONS   Avoid all smoking, herbs, alcohol, and unprescribed drugs. These chemicals affect the formation and growth of the baby.  Follow your caregiver's instructions regarding medicine use. There are medicines that are either safe or unsafe to take during pregnancy.  Exercise only as directed by your caregiver. Experiencing uterine cramps is a good sign to stop exercising.  Continue to eat regular,   healthy meals.  Wear a good support bra for breast tenderness.  Do not use hot tubs, steam rooms, or saunas.  Wear your seat belt at all times when driving.  Avoid raw meat, uncooked cheese, cat litter boxes, and soil used by cats. These carry germs that can cause birth defects in the baby.  Take your prenatal vitamins.  Try taking a stool softener (if your caregiver approves) if you develop constipation. Eat more high-fiber foods,  such as fresh vegetables or fruit and whole grains. Drink plenty of fluids to keep your urine clear or pale yellow.  Take warm sitz baths to soothe any pain or discomfort caused by hemorrhoids. Use hemorrhoid cream if your caregiver approves.  If you develop varicose veins, wear support hose. Elevate your feet for 15 minutes, 3 4 times a day. Limit salt in your diet.  Avoid heavy lifting, wear low heel shoes, and practice good posture.  Rest with your legs elevated if you have leg cramps or low back pain.  Visit your dentist if you have not gone yet during your pregnancy. Use a soft toothbrush to brush your teeth and be gentle when you floss.  A sexual relationship may be continued unless your caregiver directs you otherwise.  Continue to go to all your prenatal visits as directed by your caregiver. SEEK MEDICAL CARE IF:   You have dizziness.  You have mild pelvic cramps, pelvic pressure, or nagging pain in the abdominal area.  You have persistent nausea, vomiting, or diarrhea.  You have a bad smelling vaginal discharge.  You have pain with urination. SEEK IMMEDIATE MEDICAL CARE IF:   You have a fever.  You are leaking fluid from your vagina.  You have spotting or bleeding from your vagina.  You have severe abdominal cramping or pain.  You have rapid weight gain or loss.  You have shortness of breath with chest pain.  You notice sudden or extreme swelling of your face, hands, ankles, feet, or legs.  You have not felt your baby move in over an hour.  You have severe headaches that do not go away with medicine.  You have vision changes. Document Released: 12/03/2001 Document Revised: 08/11/2013 Document Reviewed: 02/09/2013 [employer] Patient Information 2014 [employer]  

## 2014-01-31 NOTE — Progress Notes (Signed)
U/S(20+4wks)-active fetus, meas c/w dates, fluid wnl, anterior Gr 0 placenta, cx appears closed (3.6cm), FHR-148 bpm, female fetus, no major abnl noted

## 2014-02-28 ENCOUNTER — Encounter: Payer: BC Managed Care – PPO | Admitting: Women's Health

## 2014-03-02 ENCOUNTER — Encounter (INDEPENDENT_AMBULATORY_CARE_PROVIDER_SITE_OTHER): Payer: Self-pay

## 2014-03-02 ENCOUNTER — Encounter: Payer: Self-pay | Admitting: Obstetrics & Gynecology

## 2014-03-02 ENCOUNTER — Ambulatory Visit (INDEPENDENT_AMBULATORY_CARE_PROVIDER_SITE_OTHER): Payer: BC Managed Care – PPO | Admitting: Obstetrics & Gynecology

## 2014-03-02 VITALS — BP 100/60 | Wt 166.0 lb

## 2014-03-02 DIAGNOSIS — O360990 Maternal care for other rhesus isoimmunization, unspecified trimester, not applicable or unspecified: Secondary | ICD-10-CM

## 2014-03-02 DIAGNOSIS — Z331 Pregnant state, incidental: Secondary | ICD-10-CM

## 2014-03-02 DIAGNOSIS — Z1389 Encounter for screening for other disorder: Secondary | ICD-10-CM

## 2014-03-02 LAB — POCT URINALYSIS DIPSTICK
Blood, UA: NEGATIVE
Glucose, UA: NEGATIVE
Ketones, UA: NEGATIVE
LEUKOCYTES UA: NEGATIVE
NITRITE UA: NEGATIVE
Protein, UA: NEGATIVE

## 2014-03-02 NOTE — Progress Notes (Signed)
Refusing glucola test: explanation given regarding universal screening  BP weight and urine results all reviewed and noted. Patient reports good fetal movement, denies any bleeding and no rupture of membranes symptoms or regular contractions. Patient is without complaints. All questions were answered.

## 2014-03-22 ENCOUNTER — Encounter: Payer: Self-pay | Admitting: Obstetrics & Gynecology

## 2014-03-22 ENCOUNTER — Ambulatory Visit (INDEPENDENT_AMBULATORY_CARE_PROVIDER_SITE_OTHER): Payer: BC Managed Care – PPO | Admitting: Obstetrics & Gynecology

## 2014-03-22 VITALS — BP 110/60 | Wt 166.0 lb

## 2014-03-22 DIAGNOSIS — Z01419 Encounter for gynecological examination (general) (routine) without abnormal findings: Secondary | ICD-10-CM

## 2014-03-22 DIAGNOSIS — Z029 Encounter for administrative examinations, unspecified: Secondary | ICD-10-CM

## 2014-03-22 DIAGNOSIS — O9989 Other specified diseases and conditions complicating pregnancy, childbirth and the puerperium: Secondary | ICD-10-CM

## 2014-03-22 DIAGNOSIS — O360990 Maternal care for other rhesus isoimmunization, unspecified trimester, not applicable or unspecified: Secondary | ICD-10-CM

## 2014-03-22 DIAGNOSIS — Z331 Pregnant state, incidental: Secondary | ICD-10-CM

## 2014-03-22 DIAGNOSIS — Z1389 Encounter for screening for other disorder: Secondary | ICD-10-CM

## 2014-03-22 LAB — POCT URINALYSIS DIPSTICK
Blood, UA: NEGATIVE
GLUCOSE UA: NEGATIVE
Ketones, UA: NEGATIVE
Leukocytes, UA: NEGATIVE
NITRITE UA: NEGATIVE
Protein, UA: NEGATIVE

## 2014-03-22 MED ORDER — METOPROLOL TARTRATE 100 MG PO TABS
ORAL_TABLET | ORAL | Status: DC
Start: 2014-03-22 — End: 2014-04-05

## 2014-03-22 MED ORDER — METOPROLOL TARTRATE 50 MG PO TABS: ORAL_TABLET | ORAL | Status: DC

## 2014-03-22 NOTE — Progress Notes (Signed)
Still having lots of SVT episodes Will bump up metorolol to 75 BID Keep appt for next week  BP weight and urine results all reviewed and noted. Patient reports good fetal movement, denies any bleeding and no rupture of membranes symptoms or regular contractions. Patient is without complaints. All questions were answered.

## 2014-03-29 ENCOUNTER — Other Ambulatory Visit: Payer: Self-pay | Admitting: Obstetrics & Gynecology

## 2014-03-29 ENCOUNTER — Ambulatory Visit (INDEPENDENT_AMBULATORY_CARE_PROVIDER_SITE_OTHER): Payer: BC Managed Care – PPO

## 2014-03-29 ENCOUNTER — Ambulatory Visit (INDEPENDENT_AMBULATORY_CARE_PROVIDER_SITE_OTHER): Payer: BC Managed Care – PPO | Admitting: Women's Health

## 2014-03-29 ENCOUNTER — Other Ambulatory Visit: Payer: BC Managed Care – PPO

## 2014-03-29 ENCOUNTER — Encounter: Payer: Self-pay | Admitting: Women's Health

## 2014-03-29 VITALS — BP 110/58 | Wt 168.8 lb

## 2014-03-29 DIAGNOSIS — O99320 Drug use complicating pregnancy, unspecified trimester: Principal | ICD-10-CM

## 2014-03-29 DIAGNOSIS — Z1389 Encounter for screening for other disorder: Secondary | ICD-10-CM

## 2014-03-29 DIAGNOSIS — Z331 Pregnant state, incidental: Secondary | ICD-10-CM

## 2014-03-29 DIAGNOSIS — I472 Ventricular tachycardia: Secondary | ICD-10-CM

## 2014-03-29 DIAGNOSIS — O9989 Other specified diseases and conditions complicating pregnancy, childbirth and the puerperium: Secondary | ICD-10-CM

## 2014-03-29 DIAGNOSIS — O99891 Other specified diseases and conditions complicating pregnancy: Secondary | ICD-10-CM

## 2014-03-29 DIAGNOSIS — I4729 Other ventricular tachycardia: Secondary | ICD-10-CM

## 2014-03-29 DIAGNOSIS — O0990 Supervision of high risk pregnancy, unspecified, unspecified trimester: Secondary | ICD-10-CM

## 2014-03-29 DIAGNOSIS — F1920 Other psychoactive substance dependence, uncomplicated: Secondary | ICD-10-CM

## 2014-03-29 DIAGNOSIS — I471 Supraventricular tachycardia: Secondary | ICD-10-CM

## 2014-03-29 DIAGNOSIS — O360990 Maternal care for other rhesus isoimmunization, unspecified trimester, not applicable or unspecified: Secondary | ICD-10-CM

## 2014-03-29 LAB — POCT URINALYSIS DIPSTICK
Glucose, UA: NEGATIVE
Ketones, UA: NEGATIVE
Leukocytes, UA: NEGATIVE
Nitrite, UA: NEGATIVE
Protein, UA: NEGATIVE
RBC UA: NEGATIVE

## 2014-03-29 LAB — CBC
HCT: 29.6 % — ABNORMAL LOW (ref 36.0–46.0)
Hemoglobin: 9.9 g/dL — ABNORMAL LOW (ref 12.0–15.0)
MCH: 26.1 pg (ref 26.0–34.0)
MCHC: 33.4 g/dL (ref 30.0–36.0)
MCV: 77.9 fL — AB (ref 78.0–100.0)
PLATELETS: 232 10*3/uL (ref 150–400)
RBC: 3.8 MIL/uL — AB (ref 3.87–5.11)
RDW: 14.3 % (ref 11.5–15.5)
WBC: 12.2 10*3/uL — ABNORMAL HIGH (ref 4.0–10.5)

## 2014-03-29 LAB — HIV ANTIBODY (ROUTINE TESTING W REFLEX): HIV 1&2 Ab, 4th Generation: NONREACTIVE

## 2014-03-29 LAB — RPR

## 2014-03-29 NOTE — Progress Notes (Signed)
Reports good fm. Denies uc's, lof, vb, uti s/s.  Has had ~3 runs SVT since meds were increased. Feels very run down/fatigued since increase in meds, but wants to get SVE under control. Will increase lopressor to 100mg  BID. Wants referral to local cardiologist who performs ablations- called [facility], appt made w/ Dr. Ladona Ridgel for Friday to initiate care w/ him and discuss pp cardiac ablation.  Declines 2hr gtt, states she's never had GDM in past, not obese. Reviewed today's u/s, ptl s/s, fkc.  All questions answered. F/U in 1wk to see how she's doing on lopressor increase. PN2 minus 2hr gtt today.

## 2014-03-29 NOTE — Progress Notes (Signed)
U/S(28+5wks)-vtx active fetus, meas c/w dates, fluid wnl, anterior Gr 1 placenta, cx appears closed (3.9cm), FHR-134 bpm, bilateral adnexa appears wnl, female fetus, EFW 3 lb 3 oz (68th%tile)

## 2014-03-29 NOTE — Patient Instructions (Signed)
Third Trimester of Pregnancy  The third trimester is from week 29 through week 42, months 7 through 9. The third trimester is a time when the fetus is growing rapidly. At the end of the ninth month, the fetus is about 20 inches in length and weighs 6 10 pounds.   BODY CHANGES  Your body goes through many changes during pregnancy. The changes vary from woman to woman.    Your weight will continue to increase. You can expect to gain 25 35 pounds (11 16 kg) by the end of the pregnancy.   You may begin to get stretch marks on your hips, abdomen, and breasts.   You may urinate more often because the fetus is moving lower into your pelvis and pressing on your bladder.   You may develop or continue to have heartburn as a result of your pregnancy.   You may develop constipation because certain hormones are causing the muscles that push waste through your intestines to slow down.   You may develop hemorrhoids or swollen, bulging veins (varicose veins).   You may have pelvic pain because of the weight gain and pregnancy hormones relaxing your joints between the bones in your pelvis. Back aches may result from over exertion of the muscles supporting your posture.   Your breasts will continue to grow and be tender. A yellow discharge may leak from your breasts called colostrum.   Your belly button may stick out.   You may feel short of breath because of your expanding uterus.   You may notice the fetus "dropping," or moving lower in your abdomen.   You may have a bloody mucus discharge. This usually occurs a few days to a week before labor begins.   Your cervix becomes thin and soft (effaced) near your due date.  WHAT TO EXPECT AT YOUR PRENATAL EXAMS   You will have prenatal exams every 2 weeks until week 36. Then, you will have weekly prenatal exams. During a routine prenatal visit:   You will be weighed to make sure you and the fetus are growing normally.   Your blood pressure is taken.   Your abdomen will be  measured to track your baby's growth.   The fetal heartbeat will be listened to.   Any test results from the previous visit will be discussed.   You may have a cervical check near your due date to see if you have effaced.  At around 36 weeks, your caregiver will check your cervix. At the same time, your caregiver will also perform a test on the secretions of the vaginal tissue. This test is to determine if a type of bacteria, Group B streptococcus, is present. Your caregiver will explain this further.  Your caregiver may ask you:   What your birth plan is.   How you are feeling.   If you are feeling the baby move.   If you have had any abnormal symptoms, such as leaking fluid, bleeding, severe headaches, or abdominal cramping.   If you have any questions.  Other tests or screenings that may be performed during your third trimester include:   Blood tests that check for low iron levels (anemia).   Fetal testing to check the health, activity level, and growth of the fetus. Testing is done if you have certain medical conditions or if there are problems during the pregnancy.  FALSE LABOR  You may feel small, irregular contractions that eventually go away. These are called Braxton Hicks contractions, or   false labor. Contractions may last for hours, days, or even weeks before true labor sets in. If contractions come at regular intervals, intensify, or become painful, it is best to be seen by your caregiver.   SIGNS OF LABOR    Menstrual-like cramps.   Contractions that are 5 minutes apart or less.   Contractions that start on the top of the uterus and spread down to the lower abdomen and back.   A sense of increased pelvic pressure or back pain.   A watery or bloody mucus discharge that comes from the vagina.  If you have any of these signs before the 37th week of pregnancy, call your caregiver right away. You need to go to the hospital to get checked immediately.  HOME CARE INSTRUCTIONS    Avoid all  smoking, herbs, alcohol, and unprescribed drugs. These chemicals affect the formation and growth of the baby.   Follow your caregiver's instructions regarding medicine use. There are medicines that are either safe or unsafe to take during pregnancy.   Exercise only as directed by your caregiver. Experiencing uterine cramps is a good sign to stop exercising.   Continue to eat regular, healthy meals.   Wear a good support bra for breast tenderness.   Do not use hot tubs, steam rooms, or saunas.   Wear your seat belt at all times when driving.   Avoid raw meat, uncooked cheese, cat litter boxes, and soil used by cats. These carry germs that can cause birth defects in the baby.   Take your prenatal vitamins.   Try taking a stool softener (if your caregiver approves) if you develop constipation. Eat more high-fiber foods, such as fresh vegetables or fruit and whole grains. Drink plenty of fluids to keep your urine clear or pale yellow.   Take warm sitz baths to soothe any pain or discomfort caused by hemorrhoids. Use hemorrhoid cream if your caregiver approves.   If you develop varicose veins, wear support hose. Elevate your feet for 15 minutes, 3 4 times a day. Limit salt in your diet.   Avoid heavy lifting, wear low heal shoes, and practice good posture.   Rest a lot with your legs elevated if you have leg cramps or low back pain.   Visit your dentist if you have not gone during your pregnancy. Use a soft toothbrush to brush your teeth and be gentle when you floss.   A sexual relationship may be continued unless your caregiver directs you otherwise.   Do not travel far distances unless it is absolutely necessary and only with the approval of your caregiver.   Take prenatal classes to understand, practice, and ask questions about the labor and delivery.   Make a trial run to the hospital.   Pack your hospital bag.   Prepare the baby's nursery.   Continue to go to all your prenatal visits as directed  by your caregiver.  SEEK MEDICAL CARE IF:   You are unsure if you are in labor or if your water has broken.   You have dizziness.   You have mild pelvic cramps, pelvic pressure, or nagging pain in your abdominal area.   You have persistent nausea, vomiting, or diarrhea.   You have a bad smelling vaginal discharge.   You have pain with urination.  SEEK IMMEDIATE MEDICAL CARE IF:    You have a fever.   You are leaking fluid from your vagina.   You have spotting or bleeding from your vagina.     You have severe abdominal cramping or pain.   You have rapid weight loss or gain.   You have shortness of breath with chest pain.   You notice sudden or extreme swelling of your face, hands, ankles, feet, or legs.   You have not felt your baby move in over an hour.   You have severe headaches that do not go away with medicine.   You have vision changes.  Document Released: 12/03/2001 Document Revised: 08/11/2013 Document Reviewed: 02/09/2013  [employer] Patient Information 2014 [employer].

## 2014-03-30 ENCOUNTER — Encounter: Payer: Self-pay | Admitting: Women's Health

## 2014-03-30 DIAGNOSIS — O99019 Anemia complicating pregnancy, unspecified trimester: Secondary | ICD-10-CM | POA: Insufficient documentation

## 2014-03-30 LAB — ANTIBODY SCREEN: Antibody Screen: NEGATIVE

## 2014-03-31 LAB — HSV 2 ANTIBODY, IGG: HSV 2 Glycoprotein G Ab, IgG: <0.1 IV

## 2014-04-01 ENCOUNTER — Ambulatory Visit (INDEPENDENT_AMBULATORY_CARE_PROVIDER_SITE_OTHER): Payer: BC Managed Care – PPO | Admitting: Internal Medicine

## 2014-04-01 ENCOUNTER — Encounter: Payer: Self-pay | Admitting: Internal Medicine

## 2014-04-01 VITALS — BP 91/60 | HR 90 | Ht 63.0 in | Wt 168.4 lb

## 2014-04-01 DIAGNOSIS — I471 Supraventricular tachycardia: Secondary | ICD-10-CM

## 2014-04-01 NOTE — Patient Instructions (Signed)
Your physician recommends that you schedule a follow-up appointment in: 3-4 months with Dr Ladona Ridgel  Your physician has recommended you make the following change in your medication:   36 weeks :Decrease Metoprolol 100 mg am and 50 mg pm for 1 week.  37 weeks: Metoprolol 50 mg am and 50 mg pm  38 weeks: Metoprolol 50 mg am and 25 mg pm  39 weeks: Metoprolol 25 mg am and 25 mg in pm ( Please continue to stay on this dose)

## 2014-04-04 ENCOUNTER — Encounter: Payer: Self-pay | Admitting: Internal Medicine

## 2014-04-04 ENCOUNTER — Telehealth: Payer: Self-pay | Admitting: Women's Health

## 2014-04-04 MED ORDER — FERROUS SULFATE 325 (65 FE) MG PO TABS: 325.0000 mg | ORAL_TABLET | Freq: Two times a day (BID) | ORAL | Status: DC

## 2014-04-04 NOTE — Assessment & Plan Note (Signed)
I have instructed the patient to start weaning off of metoprolol during the 36 week of pregnancy as noted in her patient instructions. She understands and will do so. I would anticipate she remain on 25-50 mg of metoprolol daily after the weaning until she delivers. At that point she may go back on additional beta blocker therapy. I discussed catheter ablation. If she does have documented SVT, then I would strongly encourage considering catheter ablation after her child is born.

## 2014-04-04 NOTE — Progress Notes (Addendum)
HPI Sharon Rowland is referred today for evaluation of SVT. She is [redacted] weeks pregnant. She carries a long history of SVT but has very little in the way of documented SVT. She has previously lived in [place]. She notes that her symptoms typically worsen when she is pregnant. She is now in her 4th pregnancy, third trimester. She is on high dose beta blockers which have been titrated upward over the years. She has been followed by Dr. Earna Coder. She states that she typically does not seek medical attention when she has an episode of SVT. It is unclear to me whether she has ever received adenosine. I have reviewed her records from [place] and there is no documented SVT. She gets sob and chest pressure with her palpitations. No syncope. Allergies  Allergen Reactions  . Latex Dermatitis     Current Outpatient Prescriptions  Medication Sig Dispense Refill  . acetaminophen (TYLENOL) 500 MG tablet Take 500 mg by mouth as needed.      Marland Kitchen albuterol (PROVENTIL HFA;VENTOLIN HFA) 108 (90 BASE) MCG/ACT inhaler Inhale 2 puffs into the lungs as needed for wheezing or shortness of breath.      . metoprolol (LOPRESSOR) 100 MG tablet Take 1 tablet twice a day  60 tablet  3  . Prenatal Multivit-Min-Fe-FA (PRENATAL VITAMINS PO) Take 1 tablet by mouth daily.      . ferrous sulfate 325 (65 FE) MG tablet Take 1 tablet (325 mg total) by mouth 2 (two) times daily with a meal.  60 tablet  3   No current facility-administered medications for this visit.     Past Medical History  Diagnosis Date  . PSVT (paroxysmal supraventricular tachycardia)   . Asthma   . Allergic rhinitis   . Pelvic pain in antepartum period in second trimester 01/17/2014    ROS:   All systems reviewed and negative except as noted in the HPI.   History reviewed. No pertinent past surgical history.   Family History  Problem Relation Age of Onset  . Hypertension Mother   . Hyperlipidemia Mother   . Multiple sclerosis Mother   . COPD  Mother   . Cancer Mother     melanoma x 2  . Diabetes Mother   . Heart disease Father     PSVT  . Hyperlipidemia Brother   . Hypertension Brother   . Heart disease Maternal Grandfather     heart attack  . Cancer Paternal Grandmother     pancreatic cancer  . Heart disease Paternal Grandmother     MI  . Hyperlipidemia Paternal Grandmother   . Hypertension Paternal Grandmother      History   Social History  . Marital Status: Married    Spouse Name: N/A    Number of Children: N/A  . Years of Education: N/A   Occupational History  . Not on file.   Social History Main Topics  . Smoking status: Never Smoker   . Smokeless tobacco: Never Used  . Alcohol Use: No  . Drug Use: No  . Sexual Activity: Yes    Birth Control/ Protection: None   Other Topics Concern  . Not on file   Social History Narrative  . No narrative on file     BP 91/60  Pulse 90  Ht 5\' 3"  (1.6 m)  Wt 168 lb 6.4 oz (76.386 kg)  BMI 29.84 kg/m2  SpO2 99%  Physical Exam:  Well appearing 28 yo pregnant young woman, NAD HEENT:  Unremarkable Neck:  No JVD, no thyromegally Back:  No CVA tenderness Lungs:  Clear with no wheezes HEART:  Regular rate rhythm, no murmurs, no rubs, no clicks Abd:  soft, gravid,positive bowel sounds, no organomegally, no rebound, no guarding Ext:  2 plus pulses, no edema, no cyanosis, no clubbing Skin:  No rashes no nodules Neuro:  CN II through XII intact, motor grossly intact  EKG NSR with no ventricular pre-excitation   Assess/Plan:      HPI  Allergies  Allergen Reactions  . Latex Dermatitis     Current Outpatient Prescriptions  Medication Sig Dispense Refill  . acetaminophen (TYLENOL) 500 MG tablet Take 500 mg by mouth as needed.      Marland Kitchen albuterol (PROVENTIL HFA;VENTOLIN HFA) 108 (90 BASE) MCG/ACT inhaler Inhale 2 puffs into the lungs as needed for wheezing or shortness of breath.      . metoprolol (LOPRESSOR) 100 MG tablet Take 1 tablet twice a day   60 tablet  3  . Prenatal Multivit-Min-Fe-FA (PRENATAL VITAMINS PO) Take 1 tablet by mouth daily.      . ferrous sulfate 325 (65 FE) MG tablet Take 1 tablet (325 mg total) by mouth 2 (two) times daily with a meal.  60 tablet  3   No current facility-administered medications for this visit.     Past Medical History  Diagnosis Date  . PSVT (paroxysmal supraventricular tachycardia)   . Asthma   . Allergic rhinitis   . Pelvic pain in antepartum period in second trimester 01/17/2014    ROS:   All systems reviewed and negative except as noted in the HPI.   History reviewed. No pertinent past surgical history.   Family History  Problem Relation Age of Onset  . Hypertension Mother   . Hyperlipidemia Mother   . Multiple sclerosis Mother   . COPD Mother   . Cancer Mother     melanoma x 2  . Diabetes Mother   . Heart disease Father     PSVT  . Hyperlipidemia Brother   . Hypertension Brother   . Heart disease Maternal Grandfather     heart attack  . Cancer Paternal Grandmother     pancreatic cancer  . Heart disease Paternal Grandmother     MI  . Hyperlipidemia Paternal Grandmother   . Hypertension Paternal Grandmother      History   Social History  . Marital Status: Married    Spouse Name: N/A    Number of Children: N/A  . Years of Education: N/A   Occupational History  . Not on file.   Social History Main Topics  . Smoking status: Never Smoker   . Smokeless tobacco: Never Used  . Alcohol Use: No  . Drug Use: No  . Sexual Activity: Yes    Birth Control/ Protection: None   Other Topics Concern  . Not on file   Social History Narrative  . No narrative on file     BP 91/60  Pulse 90  Ht 5\' 3"  (1.6 m)  Wt 168 lb 6.4 oz (76.386 kg)  BMI 29.84 kg/m2  SpO2 99%  Physical Exam:  Well appearing NAD HEENT: Unremarkable Neck:  No JVD, no thyromegally Lymphatics:  No adenopathy Back:  No CVA tenderness Lungs:  Clear HEART:  Regular rate rhythm, no  murmurs, no rubs, no clicks Abd:  soft, positive bowel sounds, no organomegally, no rebound, no guarding Ext:  2 plus pulses, no edema, no cyanosis, no clubbing Skin:  No rashes no nodules Neuro:  CN II through XII intact, motor grossly intact  EKG - NSR with no ventricular pre-excitation.  DEVICE  Normal device function.  See PaceArt for details.   Assess/Plan:

## 2014-04-04 NOTE — Telephone Encounter (Signed)
LM notifying pt of anemia, rx ferrous sulfate sent to pharmacy, increase Fe-rich foods. Has appt tomorrow.  Cheral Marker, CNM, [facility] 04/04/2014 5:25 PM

## 2014-04-05 ENCOUNTER — Encounter: Payer: Self-pay | Admitting: Women's Health

## 2014-04-05 ENCOUNTER — Ambulatory Visit (INDEPENDENT_AMBULATORY_CARE_PROVIDER_SITE_OTHER): Payer: BC Managed Care – PPO | Admitting: Obstetrics & Gynecology

## 2014-04-05 VITALS — BP 100/58 | Wt 170.0 lb

## 2014-04-05 DIAGNOSIS — Z01419 Encounter for gynecological examination (general) (routine) without abnormal findings: Secondary | ICD-10-CM

## 2014-04-05 DIAGNOSIS — O9989 Other specified diseases and conditions complicating pregnancy, childbirth and the puerperium: Secondary | ICD-10-CM

## 2014-04-05 DIAGNOSIS — O360990 Maternal care for other rhesus isoimmunization, unspecified trimester, not applicable or unspecified: Secondary | ICD-10-CM

## 2014-04-05 DIAGNOSIS — Z1389 Encounter for screening for other disorder: Secondary | ICD-10-CM

## 2014-04-05 DIAGNOSIS — I471 Supraventricular tachycardia: Secondary | ICD-10-CM

## 2014-04-05 DIAGNOSIS — Z331 Pregnant state, incidental: Secondary | ICD-10-CM

## 2014-04-05 DIAGNOSIS — O99891 Other specified diseases and conditions complicating pregnancy: Secondary | ICD-10-CM

## 2014-04-05 DIAGNOSIS — O99019 Anemia complicating pregnancy, unspecified trimester: Secondary | ICD-10-CM

## 2014-04-05 LAB — POCT URINALYSIS DIPSTICK
Blood, UA: NEGATIVE
GLUCOSE UA: NEGATIVE
KETONES UA: NEGATIVE
Leukocytes, UA: NEGATIVE
Nitrite, UA: NEGATIVE
Protein, UA: NEGATIVE

## 2014-04-05 MED ORDER — METOPROLOL TARTRATE 100 MG PO TABS: 100.0000 mg | ORAL_TABLET | Freq: Two times a day (BID) | ORAL | Status: DC

## 2014-04-05 MED ORDER — METOPROLOL TARTRATE 100 MG PO TABS: ORAL_TABLET | ORAL | Status: DC

## 2014-04-05 NOTE — Progress Notes (Signed)
[redacted]w[redacted]d. Saw Dr. Ladona Ridgel with cardiology, planning to taper lopressor starting at 36 weeks. Pt reports she is tired but otherwise doing well. Denies vaginal bleeding, LOF, cramping/ctx. Baby is moving well. Still declines 2 hour GTT.  Needs q4week growth u/s.

## 2014-04-12 ENCOUNTER — Telehealth: Payer: Self-pay | Admitting: Obstetrics & Gynecology

## 2014-04-12 DIAGNOSIS — J45909 Unspecified asthma, uncomplicated: Secondary | ICD-10-CM

## 2014-04-12 DIAGNOSIS — O99019 Anemia complicating pregnancy, unspecified trimester: Secondary | ICD-10-CM

## 2014-04-12 DIAGNOSIS — O26899 Other specified pregnancy related conditions, unspecified trimester: Secondary | ICD-10-CM

## 2014-04-12 DIAGNOSIS — Z6791 Unspecified blood type, Rh negative: Secondary | ICD-10-CM

## 2014-04-12 DIAGNOSIS — Z3480 Encounter for supervision of other normal pregnancy, unspecified trimester: Secondary | ICD-10-CM

## 2014-04-12 DIAGNOSIS — I471 Supraventricular tachycardia: Secondary | ICD-10-CM

## 2014-04-12 MED ORDER — FUSION PLUS PO CAPS: 1.0000 | ORAL_CAPSULE | Freq: Every day | ORAL | Status: DC

## 2014-04-12 NOTE — Telephone Encounter (Signed)
Pt states that the iron supplement that she is taking makes her very sick. Has tried taking it with meals and without, with zofran and without. Is there something else she can take or do?

## 2014-04-12 NOTE — Telephone Encounter (Signed)
Pt aware of Rx sent to Pharmacy.

## 2014-04-19 ENCOUNTER — Encounter: Payer: Self-pay | Admitting: Women's Health

## 2014-04-19 ENCOUNTER — Ambulatory Visit (INDEPENDENT_AMBULATORY_CARE_PROVIDER_SITE_OTHER): Payer: BC Managed Care – PPO | Admitting: Women's Health

## 2014-04-19 VITALS — BP 116/60 | Wt 172.0 lb

## 2014-04-19 DIAGNOSIS — Z3480 Encounter for supervision of other normal pregnancy, unspecified trimester: Secondary | ICD-10-CM

## 2014-04-19 DIAGNOSIS — Z6791 Unspecified blood type, Rh negative: Secondary | ICD-10-CM

## 2014-04-19 DIAGNOSIS — Z1389 Encounter for screening for other disorder: Secondary | ICD-10-CM

## 2014-04-19 DIAGNOSIS — Z331 Pregnant state, incidental: Secondary | ICD-10-CM

## 2014-04-19 DIAGNOSIS — O26899 Other specified pregnancy related conditions, unspecified trimester: Secondary | ICD-10-CM

## 2014-04-19 DIAGNOSIS — O360990 Maternal care for other rhesus isoimmunization, unspecified trimester, not applicable or unspecified: Secondary | ICD-10-CM

## 2014-04-19 DIAGNOSIS — O99019 Anemia complicating pregnancy, unspecified trimester: Secondary | ICD-10-CM

## 2014-04-19 LAB — POCT URINALYSIS DIPSTICK
GLUCOSE UA: NEGATIVE
Ketones, UA: NEGATIVE
Leukocytes, UA: NEGATIVE
Nitrite, UA: NEGATIVE
Protein, UA: NEGATIVE

## 2014-04-19 MED ORDER — RHO D IMMUNE GLOBULIN 1500 UNIT/2ML IJ SOLN
300.0000 ug | Freq: Once | INTRAMUSCULAR | Status: AC
  Administered 2014-04-19: 300 ug via INTRAMUSCULAR

## 2014-04-19 NOTE — Progress Notes (Signed)
Reports good fm. Denies uc's, lof, vb, uti s/s.  No complaints.  Nausea better after Fe switched. Not feeling as tired. Plans for cardiac ablation pp w/ Dr. Ladona Ridgel. Reviewed ptl s/s, fkc.  All questions answered. F/U 5/5 as scheduled for growth u/s, then 2wks for visit.  Rhogam today.

## 2014-04-25 ENCOUNTER — Other Ambulatory Visit: Payer: Self-pay | Admitting: Women's Health

## 2014-04-25 DIAGNOSIS — I471 Supraventricular tachycardia: Secondary | ICD-10-CM

## 2014-04-25 DIAGNOSIS — F1920 Other psychoactive substance dependence, uncomplicated: Secondary | ICD-10-CM

## 2014-04-25 DIAGNOSIS — O99320 Drug use complicating pregnancy, unspecified trimester: Secondary | ICD-10-CM

## 2014-04-26 ENCOUNTER — Encounter: Payer: BC Managed Care – PPO | Admitting: Obstetrics & Gynecology

## 2014-04-26 ENCOUNTER — Encounter: Payer: Self-pay | Admitting: Women's Health

## 2014-04-26 ENCOUNTER — Ambulatory Visit (INDEPENDENT_AMBULATORY_CARE_PROVIDER_SITE_OTHER): Payer: BC Managed Care – PPO | Admitting: Obstetrics & Gynecology

## 2014-04-26 ENCOUNTER — Encounter: Payer: Self-pay | Admitting: Obstetrics & Gynecology

## 2014-04-26 ENCOUNTER — Ambulatory Visit (INDEPENDENT_AMBULATORY_CARE_PROVIDER_SITE_OTHER): Payer: BC Managed Care – PPO

## 2014-04-26 VITALS — BP 110/70 | Wt 173.0 lb

## 2014-04-26 DIAGNOSIS — Z1389 Encounter for screening for other disorder: Secondary | ICD-10-CM

## 2014-04-26 DIAGNOSIS — I471 Supraventricular tachycardia: Secondary | ICD-10-CM

## 2014-04-26 DIAGNOSIS — O99320 Drug use complicating pregnancy, unspecified trimester: Secondary | ICD-10-CM

## 2014-04-26 DIAGNOSIS — F1920 Other psychoactive substance dependence, uncomplicated: Secondary | ICD-10-CM

## 2014-04-26 DIAGNOSIS — O360990 Maternal care for other rhesus isoimmunization, unspecified trimester, not applicable or unspecified: Secondary | ICD-10-CM

## 2014-04-26 DIAGNOSIS — Z3480 Encounter for supervision of other normal pregnancy, unspecified trimester: Secondary | ICD-10-CM

## 2014-04-26 DIAGNOSIS — Z331 Pregnant state, incidental: Secondary | ICD-10-CM

## 2014-04-26 DIAGNOSIS — O99019 Anemia complicating pregnancy, unspecified trimester: Secondary | ICD-10-CM

## 2014-04-26 LAB — POCT URINALYSIS DIPSTICK
Blood, UA: NEGATIVE
Glucose, UA: NEGATIVE
KETONES UA: NEGATIVE
LEUKOCYTES UA: NEGATIVE
Nitrite, UA: NEGATIVE
PROTEIN UA: NEGATIVE

## 2014-04-26 NOTE — Addendum Note (Signed)
Addended by: Gaylyn Rong A on: 04/26/2014 03:43 PM   Modules accepted: Orders

## 2014-04-26 NOTE — Progress Notes (Signed)
Sonogram report is done all normal  Lopressor 100 BID, doing well on that dose no SVT at all at this dose  BP weight and urine results all reviewed and noted. Patient reports good fetal movement, denies any bleeding and no rupture of membranes symptoms or regular contractions. Patient is without complaints. All questions were answered.  Repeat scan 4 weeks

## 2014-04-26 NOTE — Progress Notes (Addendum)
U/S(32+5wks)- vtx active fetus, approp growth EFW 4 lb 13 oz (57th%tile), fluid wnl AFI-12.3cm SDP-4.9cm, FHR-129 BPM, female fetus, anterior Gr 2 placenta

## 2014-05-03 ENCOUNTER — Ambulatory Visit (INDEPENDENT_AMBULATORY_CARE_PROVIDER_SITE_OTHER): Payer: BC Managed Care – PPO | Admitting: Obstetrics & Gynecology

## 2014-05-03 ENCOUNTER — Encounter: Payer: Self-pay | Admitting: Obstetrics & Gynecology

## 2014-05-03 VITALS — BP 110/60 | Wt 173.0 lb

## 2014-05-03 DIAGNOSIS — O99019 Anemia complicating pregnancy, unspecified trimester: Secondary | ICD-10-CM

## 2014-05-03 DIAGNOSIS — Z1389 Encounter for screening for other disorder: Secondary | ICD-10-CM

## 2014-05-03 DIAGNOSIS — Z331 Pregnant state, incidental: Secondary | ICD-10-CM

## 2014-05-03 DIAGNOSIS — O360990 Maternal care for other rhesus isoimmunization, unspecified trimester, not applicable or unspecified: Secondary | ICD-10-CM

## 2014-05-03 NOTE — Progress Notes (Signed)
Having some BH contractions back pain and pelvic pressure  cx LTC firm vertex BP weight and urine results all reviewed and noted. Patient reports good fetal movement, denies any bleeding and no rupture of membranes symptoms or regular contractions. Patient is without complaints. All questions were answered.

## 2014-05-04 ENCOUNTER — Encounter: Payer: BC Managed Care – PPO | Admitting: Women's Health

## 2014-05-10 ENCOUNTER — Encounter: Payer: Self-pay | Admitting: Advanced Practice Midwife

## 2014-05-10 ENCOUNTER — Ambulatory Visit (INDEPENDENT_AMBULATORY_CARE_PROVIDER_SITE_OTHER): Payer: BC Managed Care – PPO | Admitting: Advanced Practice Midwife

## 2014-05-10 VITALS — BP 110/62 | Wt 176.0 lb

## 2014-05-10 DIAGNOSIS — Z1389 Encounter for screening for other disorder: Secondary | ICD-10-CM

## 2014-05-10 DIAGNOSIS — O26899 Other specified pregnancy related conditions, unspecified trimester: Secondary | ICD-10-CM

## 2014-05-10 DIAGNOSIS — O99019 Anemia complicating pregnancy, unspecified trimester: Secondary | ICD-10-CM

## 2014-05-10 DIAGNOSIS — I471 Supraventricular tachycardia: Secondary | ICD-10-CM

## 2014-05-10 DIAGNOSIS — Z331 Pregnant state, incidental: Secondary | ICD-10-CM

## 2014-05-10 DIAGNOSIS — Z6791 Unspecified blood type, Rh negative: Secondary | ICD-10-CM

## 2014-05-10 DIAGNOSIS — R300 Dysuria: Secondary | ICD-10-CM

## 2014-05-10 DIAGNOSIS — O360990 Maternal care for other rhesus isoimmunization, unspecified trimester, not applicable or unspecified: Secondary | ICD-10-CM

## 2014-05-10 LAB — POCT URINALYSIS DIPSTICK
Blood, UA: NEGATIVE
GLUCOSE UA: NEGATIVE
Ketones, UA: NEGATIVE
Leukocytes, UA: NEGATIVE
NITRITE UA: NEGATIVE

## 2014-05-10 NOTE — Progress Notes (Signed)
C/O dysuria.,  Urine dip neg, but will culture  Routine questions about pregnancy answered.  F/U in 2 weeks for growth u/s.

## 2014-05-12 LAB — URINE CULTURE
Colony Count: NO GROWTH
ORGANISM ID, BACTERIA: NO GROWTH

## 2014-05-18 ENCOUNTER — Ambulatory Visit (INDEPENDENT_AMBULATORY_CARE_PROVIDER_SITE_OTHER): Payer: BC Managed Care – PPO | Admitting: Obstetrics and Gynecology

## 2014-05-18 ENCOUNTER — Encounter: Payer: Self-pay | Admitting: Obstetrics and Gynecology

## 2014-05-18 VITALS — BP 104/70 | Wt 175.0 lb

## 2014-05-18 DIAGNOSIS — Z1389 Encounter for screening for other disorder: Secondary | ICD-10-CM

## 2014-05-18 DIAGNOSIS — Z3480 Encounter for supervision of other normal pregnancy, unspecified trimester: Secondary | ICD-10-CM

## 2014-05-18 DIAGNOSIS — Z331 Pregnant state, incidental: Secondary | ICD-10-CM

## 2014-05-18 LAB — POCT URINALYSIS DIPSTICK
Blood, UA: NEGATIVE
GLUCOSE UA: NEGATIVE
Glucose, UA: NEGATIVE
KETONES UA: NEGATIVE
Ketones, UA: NEGATIVE
LEUKOCYTES UA: NEGATIVE
LEUKOCYTES UA: NEGATIVE
NITRITE UA: NEGATIVE
Nitrite, UA: NEGATIVE
PROTEIN UA: NEGATIVE
Protein, UA: NEGATIVE
RBC UA: NEGATIVE

## 2014-05-18 NOTE — Progress Notes (Signed)
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 Cx long closed firm vtx well applied. IMP: Sharon Rowland.{Rec: reduce to 12 hr limits.

## 2014-05-18 NOTE — Progress Notes (Signed)
Pt states that she has been having contractions since last night. Pt states that last night the pain was very intense and pain were about 5 minutes apart for about an hour. Pt states that she has been having braxton hicks contractions all along but these were worse. They are better now but she just wanted things to be checked out. Pt states that she had a brownish discharge about a week ago, but has regular discharge now.

## 2014-05-18 NOTE — Patient Instructions (Signed)
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

## 2014-05-26 ENCOUNTER — Ambulatory Visit (INDEPENDENT_AMBULATORY_CARE_PROVIDER_SITE_OTHER): Payer: BC Managed Care – PPO | Admitting: Advanced Practice Midwife

## 2014-05-26 ENCOUNTER — Ambulatory Visit (INDEPENDENT_AMBULATORY_CARE_PROVIDER_SITE_OTHER): Payer: BC Managed Care – PPO

## 2014-05-26 ENCOUNTER — Encounter: Payer: Self-pay | Admitting: Advanced Practice Midwife

## 2014-05-26 ENCOUNTER — Other Ambulatory Visit: Payer: Self-pay | Admitting: Advanced Practice Midwife

## 2014-05-26 VITALS — BP 110/60 | Wt 180.0 lb

## 2014-05-26 DIAGNOSIS — Z3480 Encounter for supervision of other normal pregnancy, unspecified trimester: Secondary | ICD-10-CM

## 2014-05-26 DIAGNOSIS — Z01419 Encounter for gynecological examination (general) (routine) without abnormal findings: Secondary | ICD-10-CM

## 2014-05-26 DIAGNOSIS — Z1389 Encounter for screening for other disorder: Secondary | ICD-10-CM

## 2014-05-26 DIAGNOSIS — Z331 Pregnant state, incidental: Secondary | ICD-10-CM

## 2014-05-26 DIAGNOSIS — I471 Supraventricular tachycardia: Secondary | ICD-10-CM

## 2014-05-26 DIAGNOSIS — F1920 Other psychoactive substance dependence, uncomplicated: Secondary | ICD-10-CM

## 2014-05-26 DIAGNOSIS — O99320 Drug use complicating pregnancy, unspecified trimester: Principal | ICD-10-CM

## 2014-05-26 LAB — POCT URINALYSIS DIPSTICK
Glucose, UA: NEGATIVE
Ketones, UA: NEGATIVE
Leukocytes, UA: NEGATIVE
NITRITE UA: NEGATIVE
Protein, UA: NEGATIVE
RBC UA: NEGATIVE

## 2014-05-26 LAB — OB RESULTS CONSOLE GC/CHLAMYDIA
CHLAMYDIA, DNA PROBE: NEGATIVE
GC PROBE AMP, GENITAL: NEGATIVE

## 2014-05-26 MED ORDER — METOPROLOL TARTRATE 25 MG PO TABS: 25.0000 mg | ORAL_TABLET | Freq: Two times a day (BID) | ORAL | Status: DC

## 2014-05-26 NOTE — Progress Notes (Signed)
High Risk Pregnancy Diagnosis(es):   SVT on Lopresser (risk for IUGR)  [id number] Estimated Date of Delivery: 06/16/14  Blood pressure 110/60, weight 180 lb (81.647 kg).  Urinalysis: Negative   HPI:   Here for OB visit. Had growth [place] r/t SVT on Lopresser 100mg  BID  BP weight and urine results all reviewed and noted. Patient reports good fetal movement, denies any bleeding and no rupture of membranes symptoms or regular contractions. Has started having increased episodes of SVT, ~ 5/day.  It wakes her up and causes chest pain.  Would like to increase meds if possible.  Fundal Height:  Growth 67% with normal fluid Fetal Heart rate:  130 Edema:  none  Patient is without complaints. All questions were answered.  Assessment:  [id number] at 37 weeks, normal growth  Medication(s) Plans:  Discussed increasing Lopresser with LHE.  May increase to 125mg  BID  Treatment Plan:  No more growth u/s needed.   Follow up in 1 weeks for OB appt

## 2014-05-26 NOTE — Progress Notes (Signed)
U/S(37+0wks)-vtx active fetus, approp growth EFW 7 lb 5 oz (69th%tile), fluid WNL AFI-8.9cm SDP-2.9cm, FHR-130 bpm, anterior Gr 2 placenta

## 2014-05-27 ENCOUNTER — Encounter: Payer: Self-pay | Admitting: Obstetrics & Gynecology

## 2014-05-27 ENCOUNTER — Telehealth: Payer: Self-pay | Admitting: *Deleted

## 2014-05-27 DIAGNOSIS — Z3480 Encounter for supervision of other normal pregnancy, unspecified trimester: Secondary | ICD-10-CM

## 2014-05-27 DIAGNOSIS — Z6791 Unspecified blood type, Rh negative: Secondary | ICD-10-CM

## 2014-05-27 DIAGNOSIS — J45909 Unspecified asthma, uncomplicated: Secondary | ICD-10-CM

## 2014-05-27 DIAGNOSIS — O26899 Other specified pregnancy related conditions, unspecified trimester: Secondary | ICD-10-CM

## 2014-05-27 DIAGNOSIS — O99019 Anemia complicating pregnancy, unspecified trimester: Secondary | ICD-10-CM

## 2014-05-27 DIAGNOSIS — I471 Supraventricular tachycardia: Secondary | ICD-10-CM

## 2014-05-27 LAB — GC/CHLAMYDIA PROBE AMP
CT Probe RNA: NEGATIVE
GC PROBE AMP APTIMA: NEGATIVE

## 2014-05-27 NOTE — Telephone Encounter (Signed)
Pt saw Drenda FreezeFran yesterday and Metoprolol was increased and the pt forgot to ask for a note to be out of work for a week. Can she get a work note to excuse her from work until she is seen on Friday by Dr. Emelda FearFerguson.

## 2014-05-28 LAB — STREP B DNA PROBE: GBSP: POSITIVE

## 2014-06-03 ENCOUNTER — Ambulatory Visit (INDEPENDENT_AMBULATORY_CARE_PROVIDER_SITE_OTHER): Payer: BC Managed Care – PPO | Admitting: Internal Medicine

## 2014-06-03 ENCOUNTER — Encounter: Payer: BC Managed Care – PPO | Admitting: Obstetrics and Gynecology

## 2014-06-03 VITALS — BP 106/76 | HR 100 | Ht 64.0 in | Wt 180.0 lb

## 2014-06-03 DIAGNOSIS — I471 Supraventricular tachycardia, unspecified: Secondary | ICD-10-CM

## 2014-06-03 NOTE — Patient Instructions (Signed)
Your physician recommends that you schedule a follow-up appointment in: 2 months with Dr Ladona Ridgel  Your physician recommends that you continue on your current medications as directed. Please refer to the Current Medication list given to you today.

## 2014-06-05 ENCOUNTER — Encounter: Payer: Self-pay | Admitting: Internal Medicine

## 2014-06-05 NOTE — Progress Notes (Signed)
HPI [name] returns today for followup. She is a 3rd trimester pregnant 28 yo woman with a poorly documented history of SVT, who has been on high dose metoprolol throughout and prior to her pregnancy. The patient saw me several weeks ago and at that time I was concerned about her high dose of metoprolol and recommended that she attempt to wean the beta blocker. Unfortunately she could not tolerate reducing her dose and her OB actually recommended she uptitrate the does of her metoprolol and she is now on 125 mg twice daily. She states that she is still having 4-5 episodes which are short limited. We still do not have any actual documentation of SVT. Her pregnancy is apparently going well.  Allergies  Allergen Reactions  . Latex Dermatitis     Current Outpatient Prescriptions  Medication Sig Dispense Refill  . acetaminophen (TYLENOL) 500 MG tablet Take 500 mg by mouth as needed.      Marland Kitchen. albuterol (PROVENTIL HFA;VENTOLIN HFA) 108 (90 BASE) MCG/ACT inhaler Inhale 2 puffs into the lungs as needed for wheezing or shortness of breath.      . Iron-FA-B Cmp-C-Biot-Probiotic (FUSION PLUS) CAPS Take 1 tablet by mouth daily.  30 capsule  11  . metoprolol (LOPRESSOR) 100 MG tablet Take 1 tablet twice a day  60 tablet  3  . metoprolol tartrate (LOPRESSOR) 25 MG tablet Take 1 tablet (25 mg total) by mouth 2 (two) times daily.  60 tablet  2  . Prenatal Multivit-Min-Fe-FA (PRENATAL VITAMINS PO) Take 1 tablet by mouth daily.       No current facility-administered medications for this visit.     Past Medical History  Diagnosis Date  . PSVT (paroxysmal supraventricular tachycardia)   . Asthma   . Allergic rhinitis   . Pelvic pain in antepartum period in second trimester 01/17/2014    ROS:   All systems reviewed and negative except as noted in the HPI.   History reviewed. No pertinent past surgical history.   Family History  Problem Relation Age of Onset  . Hypertension Mother   .  Hyperlipidemia Mother   . Multiple sclerosis Mother   . COPD Mother   . Cancer Mother     melanoma x 2  . Diabetes Mother   . Heart disease Father     PSVT  . Hyperlipidemia Brother   . Hypertension Brother   . Heart disease Maternal Grandfather     heart attack  . Cancer Paternal Grandmother     pancreatic cancer  . Heart disease Paternal Grandmother     MI  . Hyperlipidemia Paternal Grandmother   . Hypertension Paternal Grandmother      History   Social History  . Marital Status: Married    Spouse Name: N/A    Number of Children: N/A  . Years of Education: N/A   Occupational History  . Not on file.   Social History Main Topics  . Smoking status: Never Smoker   . Smokeless tobacco: Never Used  . Alcohol Use: No  . Drug Use: No  . Sexual Activity: Yes    Birth Control/ Protection: None   Other Topics Concern  . Not on file   Social History Narrative  . No narrative on file     BP 106/76  Pulse 100  Ht 5\' 4"  (1.626 m)  Wt 180 lb (81.647 kg)  BMI 30.88 kg/m2  Physical Exam:  Well appearing 3rd trimester pregnant woman, NAD  HEENT: Unremarkable Neck:  No JVD, no thyromegally Back:  No CVA tenderness Lungs:  Clear with no wheezes HEART:  Regular rate rhythm, no murmurs, no rubs, no clicks Abd:  soft, positive bowel sounds, no organomegally, no rebound, no guarding Ext:  2 plus pulses, no edema, no cyanosis, no clubbing Skin:  No rashes no nodules Neuro:  CN II through XII intact, motor grossly intact  EKG - nsr   Assess/Plan:

## 2014-06-05 NOTE — Assessment & Plan Note (Signed)
We presume that this is her diagnosis. I have never seen a patient on this much beta blocker at term. I have told the patient that I am concerned about delivering on high dose beta blocker. Apparently her baby is doing well. I would like dose if the baby develops any fetal bradycardia. If we can confirm that she has actually had documented SVT, then will strongly consider catheter ablation and or tubal ligation.

## 2014-06-06 ENCOUNTER — Encounter: Payer: Self-pay | Admitting: Women's Health

## 2014-06-06 ENCOUNTER — Encounter: Payer: Self-pay | Admitting: Internal Medicine

## 2014-06-06 ENCOUNTER — Ambulatory Visit (INDEPENDENT_AMBULATORY_CARE_PROVIDER_SITE_OTHER): Payer: BC Managed Care – PPO | Admitting: Women's Health

## 2014-06-06 VITALS — BP 112/68 | Wt 182.0 lb

## 2014-06-06 DIAGNOSIS — I471 Supraventricular tachycardia: Secondary | ICD-10-CM

## 2014-06-06 DIAGNOSIS — Z1389 Encounter for screening for other disorder: Secondary | ICD-10-CM

## 2014-06-06 DIAGNOSIS — Z331 Pregnant state, incidental: Secondary | ICD-10-CM

## 2014-06-06 DIAGNOSIS — Z3480 Encounter for supervision of other normal pregnancy, unspecified trimester: Secondary | ICD-10-CM

## 2014-06-06 DIAGNOSIS — O09899 Supervision of other high risk pregnancies, unspecified trimester: Secondary | ICD-10-CM

## 2014-06-06 LAB — POCT URINALYSIS DIPSTICK
GLUCOSE UA: NEGATIVE
KETONES UA: NEGATIVE
LEUKOCYTES UA: NEGATIVE
Nitrite, UA: NEGATIVE
Protein, UA: NEGATIVE
RBC UA: NEGATIVE

## 2014-06-06 NOTE — Progress Notes (Signed)
High Risk Pregnancy Diagnosis(es): SVT on Lopressor (r/f IUGR) [id number] [redacted]w[redacted]d Estimated Date of Delivery: 06/16/14 Blood pressure 112/68, weight 182 lb (82.555 kg).  Urinalysis: Negative HPI:  Doing well on lopressor 125mg  BID, saw Dr. Ladona Ridgel cardiologist Friday. Wants to know if she needs to stay out of work. Has been off x 1wk d/t dose change, b/c it usually makes her feel bad, however she is feeling fine and I do not see any medical indication of beginning her leave now. Also requests IOL. States she's had to be induced for postdates w/ all 3 previous pregnancies, and is 'just done' w/ being pregnant. Offered possible membrane sweeping next week.  BP, weight, and urine results all reviewed and noted.  Reports good fm. Denies regular uc's, lof, vb, uti s/s. No complaints. EFW 67% @ 37wks Fundal Height:  36 Fetal Heart rate:  120 Edema:  trace  Reviewed labor s/s, fkc. All questions were answered. Assessment: [redacted]w[redacted]d SVT on lopressor Medication(s) Plans:  Continue lopressor 125mg  bid as previously rx'd Treatment Plan:  No more growth u/s needed Follow up in 1wk for high-risk OB appt

## 2014-06-06 NOTE — Patient Instructions (Signed)
Call the office ([phone]) or go to [facility] if:  You begin to have strong, frequent contractions  Your water breaks.  Sometimes it is a big gush of fluid, sometimes it is just a trickle that keeps getting your panties wet or running down your legs  You have vaginal bleeding.  It is normal to have a small amount of spotting if your cervix was checked.   You don't feel your baby moving like normal.  If you don't, get you something to eat and drink and lay down and focus on feeling your baby move.  You should feel at least 10 movements in 2 hours.  If you don't, you should call the office or go to [facility].   Braxton Hicks Contractions Pregnancy is commonly associated with contractions of the uterus throughout the pregnancy. Towards the end of pregnancy (32 to 34 weeks), these contractions (Braxton Hicks) can develop more often and may become more forceful. This is not true labor because these contractions do not result in opening (dilatation) and thinning of the cervix. They are sometimes difficult to tell apart from true labor because these contractions can be forceful and people have different pain tolerances. You should not feel embarrassed if you go to the hospital with false labor. Sometimes, the only way to tell if you are in true labor is for your caregiver to follow the changes in the cervix. How to tell the difference between true and false labor:  False labor.  The contractions of false labor are usually shorter, irregular and not as hard as those of true labor.  They are often felt in the front of the lower abdomen and in the groin.  They may leave with walking around or changing positions while lying down.  They get weaker and are shorter lasting as time goes on.  These contractions are usually irregular.  They do not usually become progressively stronger, regular and closer together as with true labor.  True labor.  Contractions in true labor last 30 to 70  seconds, become very regular, usually become more intense, and increase in frequency.  They do not go away with walking.  The discomfort is usually felt in the top of the uterus and spreads to the lower abdomen and low back.  True labor can be determined by your caregiver with an exam. This will show that the cervix is dilating and getting thinner. If there are no prenatal problems or other health problems associated with the pregnancy, it is completely safe to be sent home with false labor and await the onset of true labor. HOME CARE INSTRUCTIONS   Keep up with your usual exercises and instructions.  Take medications as directed.  Keep your regular prenatal appointment.  Eat and drink lightly if you think you are going into labor.  If BH contractions are making you uncomfortable:  Change your activity position from lying down or resting to walking/walking to resting.  Sit and rest in a tub of warm water.  Drink 2 to 3 glasses of water. Dehydration may cause B-H contractions.  Do slow and deep breathing several times an hour. SEEK IMMEDIATE MEDICAL CARE IF:   Your contractions continue to become stronger, more regular, and closer together.  You have a gushing, burst or leaking of fluid from the vagina.  An oral temperature above 102 F (38.9 C) develops.  You have passage of blood-tinged mucus.  You develop vaginal bleeding.  You develop continuous belly (abdominal) pain.  You   have low back pain that you never had before.  You feel the baby's head pushing down causing pelvic pressure.  The baby is not moving as much as it used to. Document Released: 12/09/2005 Document Revised: 03/02/2012 Document Reviewed: 09/20/2013 Exit[employer] Patient Information 2014 [employer].  

## 2014-06-07 ENCOUNTER — Telehealth: Payer: Self-pay | Admitting: Women's Health

## 2014-06-07 NOTE — Telephone Encounter (Signed)
Spoke with pt she states she did not call, she thinks maybe her bf called because he was upset that we were not keeping her out of work.  Pt states she is not having any problems or concerns herself and is scheduled for f/u with [place] on Monday.

## 2014-06-09 ENCOUNTER — Telehealth: Payer: Self-pay | Admitting: Women's Health

## 2014-06-09 NOTE — Telephone Encounter (Signed)
Spoke with Joretta Bachelor who is on pt HIPAA privacy policy, states concerned about pt returning to work when she is complaining of the same symptoms and complications with SVT she had been taken out of work a couple of weeks ago. States pt saw cardiologist and pt was told needed to be induced due to SVT. Pt was seen in our office on 06/06/14 by Joellyn Haff, CNM. Informed Selena Batten out of office today will be glad to make an appt to discuss concerns. Stated would call our office back after discussing with patient.

## 2014-06-13 ENCOUNTER — Encounter: Payer: Self-pay | Admitting: Women's Health

## 2014-06-13 ENCOUNTER — Telehealth: Payer: Self-pay | Admitting: Women's Health

## 2014-06-13 ENCOUNTER — Encounter: Payer: BC Managed Care – PPO | Admitting: Women's Health

## 2014-06-14 ENCOUNTER — Ambulatory Visit (INDEPENDENT_AMBULATORY_CARE_PROVIDER_SITE_OTHER): Payer: BC Managed Care – PPO | Admitting: Obstetrics & Gynecology

## 2014-06-14 ENCOUNTER — Encounter: Payer: BC Managed Care – PPO | Admitting: Women's Health

## 2014-06-14 ENCOUNTER — Encounter: Payer: Self-pay | Admitting: Obstetrics & Gynecology

## 2014-06-14 VITALS — BP 110/70 | Wt 180.0 lb

## 2014-06-14 DIAGNOSIS — Z331 Pregnant state, incidental: Secondary | ICD-10-CM

## 2014-06-14 DIAGNOSIS — I471 Supraventricular tachycardia: Secondary | ICD-10-CM

## 2014-06-14 DIAGNOSIS — O09899 Supervision of other high risk pregnancies, unspecified trimester: Secondary | ICD-10-CM

## 2014-06-14 DIAGNOSIS — Z1389 Encounter for screening for other disorder: Secondary | ICD-10-CM

## 2014-06-14 DIAGNOSIS — O360990 Maternal care for other rhesus isoimmunization, unspecified trimester, not applicable or unspecified: Secondary | ICD-10-CM

## 2014-06-14 DIAGNOSIS — O99019 Anemia complicating pregnancy, unspecified trimester: Secondary | ICD-10-CM

## 2014-06-14 DIAGNOSIS — O99013 Anemia complicating pregnancy, third trimester: Secondary | ICD-10-CM

## 2014-06-14 LAB — POCT URINALYSIS DIPSTICK
Blood, UA: NEGATIVE
GLUCOSE UA: NEGATIVE
Ketones, UA: NEGATIVE
Leukocytes, UA: NEGATIVE
Nitrite, UA: NEGATIVE
Protein, UA: NEGATIVE

## 2014-06-14 NOTE — Progress Notes (Signed)
High Risk Pregnancy Diagnosis(es):   SVT on Lopressor 125 BID  G4P3003 [redacted]w[redacted]d Estimated Date of Delivery: 06/16/14  Blood pressure 110/70, weight 180 lb (81.647 kg).  Urinalysis: Negative   HPI: Pt is doing well, some SVT issues but nothing clinically significant, will keep on this dose     BP weight and urine results all reviewed and noted. Patient reports good fetal movement, denies any bleeding and no rupture of membranes symptoms or regular contractions.  Fundal Height:  125 Fetal Heart rate:  35 Edema:  trace  Patient is without complaints. All questions were answered.  Assessment:  102w5d,   Stable SVT  Medication(s) Plans:  Continue 125 BID  Treatment Plan:  Induction next week, 730 am, there were no pm slots available, pt states her past inductions have been slow anyway  Follow up in 3 weeks for SVT evaluation

## 2014-06-15 ENCOUNTER — Telehealth (HOSPITAL_COMMUNITY): Payer: Self-pay | Admitting: *Deleted

## 2014-06-15 NOTE — Telephone Encounter (Signed)
Preadmission screen  

## 2014-06-16 ENCOUNTER — Inpatient Hospital Stay (HOSPITAL_COMMUNITY)
Admission: AD | Admit: 2014-06-16 | Discharge: 2014-06-16 | Disposition: A | Discharge status: Home / Self Care | Payer: BC Managed Care – PPO | Source: Ambulatory Visit | Attending: Obstetrics & Gynecology | Admitting: Obstetrics & Gynecology

## 2014-06-16 ENCOUNTER — Encounter (HOSPITAL_COMMUNITY): Payer: Self-pay | Admitting: *Deleted

## 2014-06-16 DIAGNOSIS — O479 False labor, unspecified: Secondary | ICD-10-CM | POA: Insufficient documentation

## 2014-06-16 NOTE — Discharge Instructions (Signed)

## 2014-06-16 NOTE — MAU Note (Signed)
Contractions every 3-5 since 0145. Denies vaginal bleeding and LOF. Good FM

## 2014-06-17 ENCOUNTER — Encounter (HOSPITAL_COMMUNITY): Payer: Self-pay

## 2014-06-17 ENCOUNTER — Inpatient Hospital Stay (HOSPITAL_COMMUNITY)
Admission: AD | Admit: 2014-06-17 | Discharge: 2014-06-17 | Disposition: A | Discharge status: Home / Self Care | Payer: BC Managed Care – PPO | Source: Ambulatory Visit | Attending: Obstetrics & Gynecology | Admitting: Obstetrics & Gynecology

## 2014-06-17 ENCOUNTER — Encounter (HOSPITAL_COMMUNITY): Payer: Self-pay | Admitting: *Deleted

## 2014-06-17 ENCOUNTER — Encounter (HOSPITAL_COMMUNITY): Payer: BC Managed Care – PPO | Admitting: Anesthesiology

## 2014-06-17 ENCOUNTER — Inpatient Hospital Stay (HOSPITAL_COMMUNITY): Payer: BC Managed Care – PPO | Admitting: Anesthesiology

## 2014-06-17 ENCOUNTER — Inpatient Hospital Stay (HOSPITAL_COMMUNITY)
Admission: AD | Admit: 2014-06-17 | Discharge: 2014-06-19 | DRG: 774 | Disposition: A | Discharge status: Home / Self Care | Payer: BC Managed Care – PPO | Source: Ambulatory Visit | Attending: Obstetrics & Gynecology | Admitting: Obstetrics & Gynecology

## 2014-06-17 DIAGNOSIS — O09899 Supervision of other high risk pregnancies, unspecified trimester: Secondary | ICD-10-CM

## 2014-06-17 DIAGNOSIS — O360191 Maternal care for anti-D [Rh] antibodies, unspecified trimester, fetus 1: Secondary | ICD-10-CM

## 2014-06-17 DIAGNOSIS — O4590 Premature separation of placenta, unspecified, unspecified trimester: Principal | ICD-10-CM | POA: Diagnosis present

## 2014-06-17 DIAGNOSIS — O479 False labor, unspecified: Secondary | ICD-10-CM | POA: Diagnosis present

## 2014-06-17 DIAGNOSIS — Z3490 Encounter for supervision of normal pregnancy, unspecified, unspecified trimester: Secondary | ICD-10-CM

## 2014-06-17 DIAGNOSIS — I498 Other specified cardiac arrhythmias: Secondary | ICD-10-CM | POA: Diagnosis present

## 2014-06-17 DIAGNOSIS — I2510 Atherosclerotic heart disease of native coronary artery without angina pectoris: Secondary | ICD-10-CM | POA: Diagnosis present

## 2014-06-17 DIAGNOSIS — J45909 Unspecified asthma, uncomplicated: Secondary | ICD-10-CM

## 2014-06-17 DIAGNOSIS — O99013 Anemia complicating pregnancy, third trimester: Secondary | ICD-10-CM

## 2014-06-17 DIAGNOSIS — I471 Supraventricular tachycardia: Secondary | ICD-10-CM

## 2014-06-17 DIAGNOSIS — O9942 Diseases of the circulatory system complicating childbirth: Secondary | ICD-10-CM

## 2014-06-17 LAB — CBC
HCT: 33.3 % — ABNORMAL LOW (ref 36.0–46.0)
Hemoglobin: 10.7 g/dL — ABNORMAL LOW (ref 12.0–15.0)
MCH: 25.4 pg — AB (ref 26.0–34.0)
MCHC: 32.1 g/dL (ref 30.0–36.0)
MCV: 78.9 fL (ref 78.0–100.0)
PLATELETS: 212 10*3/uL (ref 150–400)
RBC: 4.22 MIL/uL (ref 3.87–5.11)
RDW: 18.1 % — AB (ref 11.5–15.5)
WBC: 20.5 10*3/uL — ABNORMAL HIGH (ref 4.0–10.5)

## 2014-06-17 MED ORDER — LIDOCAINE HCL (PF) 1 % IJ SOLN
INTRAMUSCULAR | Status: DC | PRN
  Administered 2014-06-17: 10 mL

## 2014-06-17 MED ORDER — NALBUPHINE HCL 10 MG/ML IJ SOLN
10.0000 mg | Freq: Once | INTRAMUSCULAR | Status: AC
Start: 2014-06-17 — End: 2014-06-17
  Administered 2014-06-17: 10 mg via INTRAMUSCULAR
  Filled 2014-06-17: qty 1

## 2014-06-17 MED ORDER — FENTANYL 2.5 MCG/ML BUPIVACAINE 1/10 % EPIDURAL INFUSION (WH - ANES)
14.0000 mL/h | INTRAMUSCULAR | Status: DC | PRN
  Administered 2014-06-17 – 2014-06-18 (×3): 14 mL/h via EPIDURAL
  Filled 2014-06-17 (×3): qty 125

## 2014-06-17 MED ORDER — FLEET ENEMA 7-19 GM/118ML RE ENEM: 1.0000 | ENEMA | RECTAL | Status: DC | PRN

## 2014-06-17 MED ORDER — NALBUPHINE HCL 10 MG/ML IJ SOLN: 10.0000 mg | Freq: Once | INTRAMUSCULAR | Status: DC

## 2014-06-17 MED ORDER — LACTATED RINGERS IV SOLN: 500.0000 mL | Freq: Once | INTRAVENOUS | Status: DC

## 2014-06-17 MED ORDER — OXYTOCIN 40 UNITS IN LACTATED RINGERS INFUSION - SIMPLE MED
62.5000 mL/h | INTRAVENOUS | Status: DC
  Filled 2014-06-17: qty 1000

## 2014-06-17 MED ORDER — DIPHENHYDRAMINE HCL 50 MG/ML IJ SOLN: 12.5000 mg | INTRAMUSCULAR | Status: DC | PRN

## 2014-06-17 MED ORDER — SODIUM CHLORIDE 0.9 % IV SOLN
1.0000 g | INTRAVENOUS | Status: DC
  Administered 2014-06-17 – 2014-06-18 (×4): 1 g via INTRAVENOUS
  Filled 2014-06-17 (×9): qty 1000

## 2014-06-17 MED ORDER — PHENYLEPHRINE 40 MCG/ML (10ML) SYRINGE FOR IV PUSH (FOR BLOOD PRESSURE SUPPORT)
80.0000 ug | PREFILLED_SYRINGE | INTRAVENOUS | Status: AC | PRN
  Administered 2014-06-18 (×3): 80 ug via INTRAVENOUS

## 2014-06-17 MED ORDER — METOPROLOL TARTRATE 25 MG PO TABS
25.0000 mg | ORAL_TABLET | Freq: Two times a day (BID) | ORAL | Status: DC
  Administered 2014-06-18: 25 mg via ORAL
  Filled 2014-06-17 (×4): qty 1

## 2014-06-17 MED ORDER — ACETAMINOPHEN 325 MG PO TABS
650.0000 mg | ORAL_TABLET | ORAL | Status: DC | PRN
Start: 2014-06-17 — End: 2014-06-18
  Administered 2014-06-18: 650 mg via ORAL
  Filled 2014-06-17: qty 2

## 2014-06-17 MED ORDER — OXYTOCIN BOLUS FROM INFUSION: 500.0000 mL | INTRAVENOUS | Status: DC

## 2014-06-17 MED ORDER — FENTANYL 2.5 MCG/ML BUPIVACAINE 1/10 % EPIDURAL INFUSION (WH - ANES): 14.0000 mL/h | INTRAMUSCULAR | Status: DC | PRN

## 2014-06-17 MED ORDER — ONDANSETRON HCL 4 MG/2ML IJ SOLN
4.0000 mg | Freq: Four times a day (QID) | INTRAMUSCULAR | Status: DC | PRN
  Administered 2014-06-18: 4 mg via INTRAVENOUS
  Filled 2014-06-17: qty 2

## 2014-06-17 MED ORDER — METOPROLOL TARTRATE 100 MG PO TABS
100.0000 mg | ORAL_TABLET | Freq: Two times a day (BID) | ORAL | Status: DC
  Administered 2014-06-18: 100 mg via ORAL
  Filled 2014-06-17 (×4): qty 1

## 2014-06-17 MED ORDER — IBUPROFEN 600 MG PO TABS: 600.0000 mg | ORAL_TABLET | Freq: Four times a day (QID) | ORAL | Status: DC | PRN

## 2014-06-17 MED ORDER — LACTATED RINGERS IV SOLN: 500.0000 mL | INTRAVENOUS | Status: DC | PRN

## 2014-06-17 MED ORDER — PHENYLEPHRINE 40 MCG/ML (10ML) SYRINGE FOR IV PUSH (FOR BLOOD PRESSURE SUPPORT)
80.0000 ug | PREFILLED_SYRINGE | INTRAVENOUS | Status: DC | PRN
  Filled 2014-06-17: qty 10
  Filled 2014-06-17: qty 2

## 2014-06-17 MED ORDER — LACTATED RINGERS IV SOLN
INTRAVENOUS | Status: DC
  Administered 2014-06-18: via INTRAVENOUS

## 2014-06-17 MED ORDER — OXYCODONE-ACETAMINOPHEN 5-325 MG PO TABS: 1.0000 | ORAL_TABLET | ORAL | Status: DC | PRN

## 2014-06-17 MED ORDER — LIDOCAINE HCL (PF) 1 % IJ SOLN
30.0000 mL | INTRAMUSCULAR | Status: DC | PRN
  Filled 2014-06-17: qty 30

## 2014-06-17 MED ORDER — EPHEDRINE 5 MG/ML INJ
10.0000 mg | INTRAVENOUS | Status: DC | PRN
  Filled 2014-06-17: qty 2

## 2014-06-17 MED ORDER — CITRIC ACID-SODIUM CITRATE 334-500 MG/5ML PO SOLN
30.0000 mL | ORAL | Status: DC | PRN
  Filled 2014-06-17: qty 15

## 2014-06-17 MED ORDER — EPHEDRINE 5 MG/ML INJ
10.0000 mg | INTRAVENOUS | Status: DC | PRN
  Filled 2014-06-17: qty 2
  Filled 2014-06-17: qty 4

## 2014-06-17 NOTE — H&P (Signed)
Sharon ManesJennifer Rowland is a 28 y.o. female [phone] with IUP at 7830w1d presenting for active labor at term. Pt states she has been having irregular contractions  Over the past 3 days, getting closer together every few minutes and more painful, associated with bloody show vaginal bleeding.  Membranes are intact, with active fetal movement.   [facility]   Prenatal History/Complications:  [facility]  [facility], 28[place], [place], 1st baby  Pap LSIL/HPV; colpo: normal, repeat 8wk pp  GC/CT Initial:      -/-          36+wks:  Genetic Screen NT/IT: neg  CF screen declined  Anatomic US Normal female  Flu vaccine Declined 12/08/13  Glucose Screen  2 hr:  REFUSED  GBS positive  Feed Preference breast  Contraception mirena  Circumcision yes  Childbirth Classes declined  Pediatrician [place]- [place]   Patient Active Problem List   Diagnosis Date Noted  . Normal labor 06/17/2014  . Anemia in pregnancy 03/30/2014  . Rh negative state in antepartum period 01/31/2014  . Abnormal Pap smear of cervix 01/31/2014  . Pelvic pain in antepartum period in second trimester 01/17/2014  . Paroxysmal SVT (supraventricular tachycardia) 11/09/2013  . Supervision of other high-risk pregnancy 11/09/2013  . Asthma, stable 11/09/2013    Past Medical History: Past Medical History  Diagnosis Date  . PSVT (paroxysmal supraventricular tachycardia)   . Allergic rhinitis   . Pelvic pain in antepartum period in second trimester 01/17/2014  . Asthma     last used inhsler 1 month ago    Past Surgical History: History reviewed. No pertinent past surgical history.  Obstetrical History: OB History   Grav Para Term Preterm Abortions TAB SAB Ect Mult Living   4 3 3       3       Social History: History   Social History  . Marital Status: Married    Spouse Name: N/A    Number of Children: N/A  . Years of Education: N/A   Social History Main Topics  . Smoking status: Never  Smoker   . Smokeless tobacco: Never Used  . Alcohol Use: No  . Drug Use: No  . Sexual Activity: Yes    Birth Control/ Protection: None   Other Topics Concern  . None   Social History Narrative  . None    Family History: Family History  Problem Relation Age of Onset  . Hypertension Mother   . Hyperlipidemia Mother   . Multiple sclerosis Mother   . COPD Mother   . Cancer Mother     melanoma x 2  . Diabetes Mother   . Heart disease Father     PSVT  . Hyperlipidemia Brother   . Hypertension Brother   . Heart disease Maternal Grandfather     heart attack  . Cancer Paternal Grandmother     pancreatic cancer  . Heart disease Paternal Grandmother     MI  . Hyperlipidemia Paternal Grandmother   . Hypertension Paternal Grandmother     Allergies: Allergies  Allergen Reactions  . Latex Dermatitis    Prescriptions prior to admission  Medication Sig Dispense Refill  . acetaminophen (TYLENOL) 500 MG tablet Take 1,000 mg by mouth as needed for moderate pain.       . calcium carbonate (TUMS - DOSED IN MG ELEMENTAL CALCIUM) 500 MG chewable tablet Chew 4 tablets by mouth 3 (three) times daily as needed for indigestion or heartburn.      .Marland Kitchen  Iron-FA-B Cmp-C-Biot-Probiotic (FUSION PLUS) CAPS Take 1 tablet by mouth daily.  30 capsule  11  . metoprolol (LOPRESSOR) 100 MG tablet Take 1 tablet twice a day  60 tablet  3  . metoprolol tartrate (LOPRESSOR) 25 MG tablet Take 1 tablet (25 mg total) by mouth 2 (two) times daily.  60 tablet  2  . Prenatal Multivit-Min-Fe-FA (PRENATAL VITAMINS PO) Take 1 tablet by mouth daily.      Marland Kitchen albuterol (PROVENTIL HFA;VENTOLIN HFA) 108 (90 BASE) MCG/ACT inhaler Inhale 2 puffs into the lungs as needed for wheezing or shortness of breath.         Review of Systems  GEN: no fever/chills, weight changes CV: occasional SVT/palpitations, no LOC/dizziness, states she can tolerate the SVT episodes well just with rest.  RESP: no SOB/cough ABD: abdominal pain  with contractions, no nausea/vomiting, no diarrhea/constipation.blood in stool GU: no dysuria/hematuria EXT: no increased swelling, no leg pain  Blood pressure 117/56, pulse 62, temperature 98.2 F (36.8 C), temperature source Oral, resp. rate 18. General appearance: alert, cooperative and no distress Lungs: clear to auscultation bilaterally Heart: regular rate and rhythm Abdomen: soft, non-tender; bowel sounds normal Extremities: Homans sign is negative, no sign of DVT Presentation: cephalic and by cervical check, see below Fetal monitoringBaseline: 130 bpm, Variability: Good {> 6 bpm), Accelerations: none, reviewed strip and (+)accel in MAU and Decelerations: Absent Uterine activityDate/time of onset: 3 days ago, Frequency: .>5 times per hour seeming to get more frequent, Intensity: strong but better after epidural placement. Some ctx on TOCO q 3 - 5 mins Dilation: 6 Effacement (%): 100 Station: -1;-2 Exam by:: Benji StanleyRN   Prenatal labs: ABO, Rh: A/NEG/-- (11/18 1130) Antibody: NEG (04/07 1205) Rubella:  Immune RPR: NON REAC (04/07 1205)  HBsAg: NEGATIVE (11/18 1130)  HIV: NON-REACTIVE (04/07 1205)  GBS: POSITIVE (06/04 1523)  2hr Glucola declined Genetic screening  neg Anatomy US normal female   Prenatal Transfer Tool  Maternal Diabetes: No Genetic Screening: Normal Maternal Ultrasounds/Referrals: Normal Fetal Ultrasounds or other Referrals:  None Maternal Substance Abuse:  No Significant Maternal Medications:  Meds include: Other: Beta blocker for SVT Significant Maternal Lab Results: Lab values include: Group B Strep positive     Results for orders placed during the hospital encounter of 06/17/14 (from the past 24 hour(s))  CBC   Collection Time    06/17/14  8:10 PM      Result Value Ref Range   WBC 20.5 (*) 4.0 - 10.5 K/uL   RBC 4.22  3.87 - 5.11 MIL/uL   Hemoglobin 10.7 (*) 12.0 - 15.0 g/dL   HCT 36.4 (*) 68.0 - 32.1 %   MCV 78.9  78.0 - 100.0 fL    MCH 25.4 (*) 26.0 - 34.0 pg   MCHC 32.1  30.0 - 36.0 g/dL   RDW 22.4 (*) 82.5 - 00.3 %   Platelets 212  150 - 400 K/uL    Assessment: Sharon Rowland is a 28 y.o. G4P3003 at [redacted]w[redacted]d by L=US here for active labor at term. Uncomplicated pregnancy with exception of maternal Hx SVT for which she takes Metoprolol.   #Labor: progressing without augmentation at this time #Pain: Epidural placed #FWB: Cat I #ID:  GBS on ampicillin #MOF: breast #MOC: mirena #Circ:  Yes  Sharon Rowland 06/17/2014, 9:33 PM  I have seen and examined this patient and I agree with the above. Cam Hai CNM 1:19 AM 06/18/2014

## 2014-06-17 NOTE — MAU Note (Signed)
Pt c/o ctx q 10 minutes. Denies vb or LOF, positive fetal movement.

## 2014-06-17 NOTE — Anesthesia Preprocedure Evaluation (Signed)
Anesthesia Evaluation    Airway       Dental   Pulmonary asthma (rare inhaler use) ,          Cardiovascular + dysrhythmias Supra Ventricular Tachycardia     Neuro/Psych    GI/Hepatic   Endo/Other    Renal/GU      Musculoskeletal   Abdominal   Peds  Hematology   Anesthesia Other Findings   Reproductive/Obstetrics                           Anesthesia Physical Anesthesia Plan  ASA: II  Anesthesia Plan: Epidural   Post-op Pain Management:    Induction:   Airway Management Planned:   Additional Equipment:   Intra-op Plan:   Post-operative Plan:   Informed Consent: I have reviewed the patients History and Physical, chart, labs and discussed the procedure including the risks, benefits and alternatives for the proposed anesthesia with the patient or authorized representative who has indicated his/her understanding and acceptance.   Dental Advisory Given  Plan Discussed with:   Anesthesia Plan Comments: (Labs checked- platelets confirmed with RN in room. Fetal heart tracing, per RN, reported to be stable enough for sitting procedure. Discussed epidural, and patient consents to the procedure:  included risk of possible headache,backache, failed block, allergic reaction, and nerve injury. This patient was asked if she had any questions or concerns before the procedure started.)        Anesthesia Quick Evaluation

## 2014-06-17 NOTE — MAU Note (Signed)
Pt breathing with u/c's and sitting straight up. Breathing coaching encouraged.

## 2014-06-17 NOTE — Anesthesia Procedure Notes (Signed)

## 2014-06-17 NOTE — Progress Notes (Signed)
Dr. Reola Calkins notified of pt's exam, pain meds requested, orders received and will recheck in one hour. Also notified of pt's b/p, maternal hr 48-73, takes metoprolol for SVT, ok to give meds for pain.

## 2014-06-17 NOTE — Discharge Instructions (Signed)
Braxton Hicks Contractions °Contractions of the uterus can occur throughout pregnancy. Contractions are not always a sign that you are in labor.  °WHAT ARE BRAXTON HICKS CONTRACTIONS?  °Contractions that occur before labor are called Braxton Hicks contractions, or false labor. Toward the end of pregnancy (32-34 weeks), these contractions can develop more often and may become more forceful. This is not true labor because these contractions do not result in opening (dilatation) and thinning of the cervix. They are sometimes difficult to tell apart from true labor because these contractions can be forceful and people have different pain tolerances. You should not feel embarrassed if you go to the hospital with false labor. Sometimes, the only way to tell if you are in true labor is for your health care provider to look for changes in the cervix. °If there are no prenatal problems or other health problems associated with the pregnancy, it is completely safe to be sent home with false labor and await the onset of true labor. °HOW CAN YOU TELL THE DIFFERENCE BETWEEN TRUE AND FALSE LABOR? °False Labor °· The contractions of false labor are usually shorter and not as hard as those of true labor.   °· The contractions are usually irregular.   °· The contractions are often felt in the front of the lower abdomen and in the groin.   °· The contractions may go away when you walk around or change positions while lying down.   °· The contractions get weaker and are shorter lasting as time goes on.   °· The contractions do not usually become progressively stronger, regular, and closer together as with true labor.   °True Labor °· Contractions in true labor last 30-70 seconds, become very regular, usually become more intense, and increase in frequency.   °· The contractions do not go away with walking.   °· The discomfort is usually felt in the top of the uterus and spreads to the lower abdomen and low back.   °· True labor can be  determined by your health care provider with an exam. This will show that the cervix is dilating and getting thinner.   °WHAT TO REMEMBER °· Keep up with your usual exercises and follow other instructions given by your health care provider.   °· Take medicines as directed by your health care provider.   °· Keep your regular prenatal appointments.   °· Eat and drink lightly if you think you are going into labor.   °· If Braxton Hicks contractions are making you uncomfortable:   °¨ Change your position from lying down or resting to walking, or from walking to resting.   °¨ Sit and rest in a tub of warm water.   °¨ Drink 2-3 glasses of water. Dehydration may cause these contractions.   °¨ Do slow and deep breathing several times an hour.   °WHEN SHOULD I SEEK IMMEDIATE MEDICAL CARE? °Seek immediate medical care if: °· Your contractions become stronger, more regular, and closer together.   °· You have fluid leaking or gushing from your vagina.   °· You have a fever.   °· You pass blood-tinged mucus.   °· You have vaginal bleeding.   °· You have continuous abdominal pain.   °· You have low back pain that you never had before.   °· You feel your baby's head pushing down and causing pelvic pressure.   °· Your baby is not moving as much as it used to.   °Document Released: 12/09/2005 Document Revised: 12/14/2013 Document Reviewed: 09/20/2013 °ExitCare® Patient Information ©2015 [employer]. This information is not intended to replace advice given to you by your health care   provider. Make sure you discuss any questions you have with your health care provider. ° °

## 2014-06-18 ENCOUNTER — Encounter (HOSPITAL_COMMUNITY): Payer: Self-pay | Admitting: *Deleted

## 2014-06-18 DIAGNOSIS — O4590 Premature separation of placenta, unspecified, unspecified trimester: Secondary | ICD-10-CM | POA: Diagnosis not present

## 2014-06-18 DIAGNOSIS — O9942 Diseases of the circulatory system complicating childbirth: Secondary | ICD-10-CM

## 2014-06-18 DIAGNOSIS — Z3490 Encounter for supervision of normal pregnancy, unspecified, unspecified trimester: Secondary | ICD-10-CM

## 2014-06-18 DIAGNOSIS — O479 False labor, unspecified: Secondary | ICD-10-CM | POA: Diagnosis not present

## 2014-06-18 DIAGNOSIS — I498 Other specified cardiac arrhythmias: Secondary | ICD-10-CM | POA: Diagnosis not present

## 2014-06-18 DIAGNOSIS — I2510 Atherosclerotic heart disease of native coronary artery without angina pectoris: Secondary | ICD-10-CM | POA: Diagnosis not present

## 2014-06-18 LAB — RPR

## 2014-06-18 MED ORDER — OXYCODONE-ACETAMINOPHEN 5-325 MG PO TABS: 1.0000 | ORAL_TABLET | ORAL | Status: DC | PRN

## 2014-06-18 MED ORDER — BENZOCAINE-MENTHOL 20-0.5 % EX AERO
1.0000 "application " | INHALATION_SPRAY | CUTANEOUS | Status: DC | PRN
  Administered 2014-06-18: 1 via TOPICAL
  Filled 2014-06-18: qty 56

## 2014-06-18 MED ORDER — DIPHENHYDRAMINE HCL 25 MG PO CAPS: 25.0000 mg | ORAL_CAPSULE | Freq: Four times a day (QID) | ORAL | Status: DC | PRN

## 2014-06-18 MED ORDER — BISACODYL 10 MG RE SUPP: 10.0000 mg | Freq: Every day | RECTAL | Status: DC | PRN

## 2014-06-18 MED ORDER — PRENATAL MULTIVITAMIN CH
1.0000 | ORAL_TABLET | Freq: Every day | ORAL | Status: DC
Start: 2014-06-19 — End: 2014-06-19
  Administered 2014-06-19: 1 via ORAL
  Filled 2014-06-18: qty 1

## 2014-06-18 MED ORDER — OXYTOCIN 40 UNITS IN LACTATED RINGERS INFUSION - SIMPLE MED
1.0000 m[IU]/min | INTRAVENOUS | Status: DC
  Administered 2014-06-18: 2 m[IU]/min via INTRAVENOUS

## 2014-06-18 MED ORDER — LANOLIN HYDROUS EX OINT: TOPICAL_OINTMENT | CUTANEOUS | Status: DC | PRN

## 2014-06-18 MED ORDER — MEASLES, MUMPS & RUBELLA VAC ~~LOC~~ INJ
0.5000 mL | INJECTION | Freq: Once | SUBCUTANEOUS | Status: DC
  Filled 2014-06-18: qty 0.5

## 2014-06-18 MED ORDER — TETANUS-DIPHTH-ACELL PERTUSSIS 5-2.5-18.5 LF-MCG/0.5 IM SUSP: 0.5000 mL | Freq: Once | INTRAMUSCULAR | Status: DC

## 2014-06-18 MED ORDER — FLEET ENEMA 7-19 GM/118ML RE ENEM: 1.0000 | ENEMA | Freq: Every day | RECTAL | Status: DC | PRN

## 2014-06-18 MED ORDER — OXYTOCIN 40 UNITS IN LACTATED RINGERS INFUSION - SIMPLE MED: 62.5000 mL/h | INTRAVENOUS | Status: DC | PRN

## 2014-06-18 MED ORDER — TERBUTALINE SULFATE 1 MG/ML IJ SOLN: 0.2500 mg | Freq: Once | INTRAMUSCULAR | Status: DC | PRN

## 2014-06-18 MED ORDER — DIBUCAINE 1 % RE OINT: 1.0000 "application " | TOPICAL_OINTMENT | RECTAL | Status: DC | PRN

## 2014-06-18 MED ORDER — SIMETHICONE 80 MG PO CHEW: 80.0000 mg | CHEWABLE_TABLET | ORAL | Status: DC | PRN

## 2014-06-18 MED ORDER — IBUPROFEN 600 MG PO TABS
600.0000 mg | ORAL_TABLET | Freq: Four times a day (QID) | ORAL | Status: DC
  Administered 2014-06-18 – 2014-06-19 (×5): 600 mg via ORAL
  Filled 2014-06-18 (×5): qty 1

## 2014-06-18 MED ORDER — SODIUM CHLORIDE 0.9 % IV SOLN: 250.0000 mL | INTRAVENOUS | Status: DC | PRN

## 2014-06-18 MED ORDER — SENNOSIDES-DOCUSATE SODIUM 8.6-50 MG PO TABS
2.0000 | ORAL_TABLET | ORAL | Status: DC
  Administered 2014-06-18: 2 via ORAL
  Filled 2014-06-18: qty 2

## 2014-06-18 MED ORDER — ONDANSETRON HCL 4 MG/2ML IJ SOLN: 4.0000 mg | INTRAMUSCULAR | Status: DC | PRN

## 2014-06-18 MED ORDER — ONDANSETRON HCL 4 MG PO TABS: 4.0000 mg | ORAL_TABLET | ORAL | Status: DC | PRN

## 2014-06-18 MED ORDER — SODIUM CHLORIDE 0.9 % IJ SOLN: 3.0000 mL | INTRAMUSCULAR | Status: DC | PRN

## 2014-06-18 MED ORDER — WITCH HAZEL-GLYCERIN EX PADS: 1.0000 "application " | MEDICATED_PAD | CUTANEOUS | Status: DC | PRN

## 2014-06-18 MED ORDER — SODIUM CHLORIDE 0.9 % IJ SOLN: 3.0000 mL | Freq: Two times a day (BID) | INTRAMUSCULAR | Status: DC

## 2014-06-18 MED ORDER — METOPROLOL TARTRATE 25 MG PO TABS
125.0000 mg | ORAL_TABLET | Freq: Two times a day (BID) | ORAL | Status: DC
  Filled 2014-06-18 (×4): qty 1

## 2014-06-18 MED ORDER — ZOLPIDEM TARTRATE 5 MG PO TABS: 5.0000 mg | ORAL_TABLET | Freq: Every evening | ORAL | Status: DC | PRN

## 2014-06-18 NOTE — Progress Notes (Signed)
Patient ID: Sharon Rowland, female   DOB: 08/21/1986, 28 y.o.   MRN: [id number] Sharon Rowland is a 28 y.o. [phone] at 3457w2d admitted for active labor  Subjective: Now w/ urge to push  Objective: BP 116/57  Pulse 86  Temp(Src) 99.6 F (37.6 C) (Oral)  Resp 18    FHT:  FHR: 155 bpm, variability: moderate,  accelerations:  Present,  decelerations:  Present occ variables UC:   q 3-685mins, pitocin was turned off earlier d/t decels  SVE:   10/100/2, pushing, mod MSF noted  Labs: Lab Results  Component Value Date   WBC 20.5* 06/17/2014   HGB 10.7* 06/17/2014   HCT 33.3* 06/17/2014   MCV 78.9 06/17/2014   PLT 212 06/17/2014    Assessment / Plan: Spontaneous labor, progressing normally, had pushed x 1hr earlier around 0600 per RN report but didn't have urge to push/wasn't making progress/and was having decels, so she labored down, and now has urge to push so she has resumed pushing. Can restart pitocin to get better uc pattern.   Labor: Progressing normally Fetal Wellbeing:  Category II Pain Control:  Epidural Pre-eclampsia: n/a I/D:  Ampicillin for gbs+ Anticipated MOD:  NSVD Will have NICU team at birth d/t mod MSF  Marge DuncansBooker, Kimberly Randall CNM, WHNP-BC 06/18/2014, 10:00 AM

## 2014-06-18 NOTE — Progress Notes (Signed)
Sharon Rowland is a 28 y.o. [phone] at 3863w2d admitted for active labor  Subjective: Patient comfortable on epidural, no questions at this time.   Objective: BP 102/54  Pulse 58  Temp(Src) 98.2 F (36.8 C) (Oral)  Resp 18      FHT:  FHR: 120 bpm, variability: moderate,  accelerations:  Abscent,  decelerations: occasional variable UC:q 3 - 6 mins on TOCO SVE:   Dilation: 7 Effacement (%): 100 Station: 0 Exam by:: Dr. Lyn HollingsheadAlexander  Labs: Lab Results  Component Value Date   WBC 20.5* 06/17/2014   HGB 10.7* 06/17/2014   HCT 33.3* 06/17/2014   MCV 78.9 06/17/2014   PLT 212 06/17/2014    Assessment / Plan: Spontaneous labor, progressing slowly with irregular contractions  Labor: will start Pitocin, progress to AROM.  Preeclampsia:  no signs or symptoms of toxicity Fetal Wellbeing:  Category II Pain Control:  Epidural I/D:  GBS pos Anticipated MOD:  NSVD  Sunnie Nielsenlexander, Audrie Kuri 06/18/2014, 12:14 AM

## 2014-06-18 NOTE — Progress Notes (Signed)
Called to patient's room with Philipp Deputy CNM  for prolonged deceleration. Intrauterine resuscitation started with IVF bolus, O2, L Lateral recimbent position. FHT recovered to baseline >100 bpm. Concomitant with decel, patient's BP 96/42, repeat BP 126/83 as FHT improved. Patient has no symptoms. Will hold Pitocin for now and continue to monitor FHT. Of note, patient's Metoprolol last taken prior to admission.   Elwin Mocha, DO, Resident 01:03 06/18/14

## 2014-06-18 NOTE — Progress Notes (Signed)
I have seen and examined this patient and I agree with the above. Cam Hai CNM 1:22 AM 06/18/2014

## 2014-06-18 NOTE — Anesthesia Postprocedure Evaluation (Signed)
Anesthesia Post Note  Patient: Sharon Rowland  Procedure(s) Performed: * No procedures listed *  Anesthesia type: Epidural  Patient location: Mother/Baby  Post pain: Pain level controlled  Post assessment: Post-op Vital signs reviewed  Last Vitals:  Filed Vitals:   06/18/14 1548  BP: 94/59  Pulse: 68  Temp: 36.6 C  Resp: 18    Post vital signs: Reviewed  Level of consciousness: awake  Complications: No apparent anesthesia complications

## 2014-06-18 NOTE — Progress Notes (Signed)
I have seen and examined this patient and I agree with the above. Cam HaiSHAW, KIMBERLY CNM 1:21 AM 06/18/2014

## 2014-06-19 DIAGNOSIS — I2510 Atherosclerotic heart disease of native coronary artery without angina pectoris: Secondary | ICD-10-CM | POA: Diagnosis not present

## 2014-06-19 DIAGNOSIS — I498 Other specified cardiac arrhythmias: Secondary | ICD-10-CM | POA: Diagnosis not present

## 2014-06-19 DIAGNOSIS — O4590 Premature separation of placenta, unspecified, unspecified trimester: Secondary | ICD-10-CM | POA: Diagnosis not present

## 2014-06-19 MED ORDER — RHO D IMMUNE GLOBULIN 1500 UNIT/2ML IJ SOSY
300.0000 ug | PREFILLED_SYRINGE | Freq: Once | INTRAMUSCULAR | Status: AC
  Administered 2014-06-19: 300 ug via INTRAMUSCULAR
  Filled 2014-06-19: qty 2

## 2014-06-19 MED ORDER — IBUPROFEN 600 MG PO TABS: 600.0000 mg | ORAL_TABLET | Freq: Four times a day (QID) | ORAL | Status: DC | PRN

## 2014-06-19 NOTE — Discharge Instructions (Signed)
Postpartum Care After Vaginal Delivery  °After you deliver your newborn (postpartum period), the usual stay in the hospital is 24-72 hours. If there were problems with your labor or delivery, or if you have other medical problems, you might be in the hospital longer.  °While you are in the hospital, you will receive help and instructions on how to care for yourself and your newborn during the postpartum period.  °While you are in the hospital:  °Be sure to tell your nurses if you have pain or discomfort, as well as where you feel the pain and what makes the pain worse.  °If you had an incision made near your vagina (episiotomy) or if you had some tearing during delivery, the nurses may put ice packs on your episiotomy or tear. The ice packs may help to reduce the pain and swelling.  °If you are breastfeeding, you may feel uncomfortable contractions of your uterus for a couple of weeks. This is normal. The contractions help your uterus get back to normal size.  °It is normal to have some bleeding after delivery.  °For the first 1-3 days after delivery, the flow is red and the amount may be similar to a period.  °It is common for the flow to start and stop.  °In the first few days, you may pass some small clots. Let your nurses know if you begin to pass large clots or your flow increases.  °Do not flush blood clots down the toilet before having the nurse look at them.  °During the next 3-10 days after delivery, your flow should become more watery and pink or brown-tinged in color.  °Ten to fourteen days after delivery, your flow should be a small amount of yellowish-white discharge.  °The amount of your flow will decrease over the first few weeks after delivery. Your flow may stop in 6-8 weeks. Most women have had their flow stop by 12 weeks after delivery. °You should change your sanitary pads frequently.  °Wash your hands thoroughly with soap and water for at least 20 seconds after changing pads, using the toilet,  or before holding or feeding your newborn.  °You should feel like you need to empty your bladder within the first 6-8 hours after delivery.  °In case you become weak, lightheaded, or faint, call your nurse before you get out of bed for the first time and before you take a shower for the first time.  °Within the first few days after delivery, your breasts may begin to feel tender and full. This is called engorgement. Breast tenderness usually goes away within 48-72 hours after engorgement occurs. You may also notice milk leaking from your breasts. If you are not breastfeeding, do not stimulate your breasts. Breast stimulation can make your breasts produce more milk.  °Spending as much time as possible with your newborn is very important. During this time, you and your newborn can feel close and get to know each other. Having your newborn stay in your room (rooming in) will help to strengthen the bond with your newborn. It will give you time to get to know your newborn and become comfortable caring for your newborn.  °Your hormones change after delivery. Sometimes the hormone changes can temporarily cause you to feel sad or tearful. These feelings should not last more than a few days. If these feelings last longer than that, you should talk to your caregiver.  °If desired, talk to your caregiver about methods of family planning or contraception.  °  Talk to your caregiver about immunizations. Your caregiver may want you to have the following immunizations before leaving the hospital:  °Tetanus, diphtheria, and pertussis (Tdap) or tetanus and diphtheria (Td) immunization. It is very important that you and your family (including grandparents) or others caring for your newborn are up-to-date with the Tdap or Td immunizations. The Tdap or Td immunization can help protect your newborn from getting ill.  °Rubella immunization.  °Varicella (chickenpox) immunization.  °Influenza immunization. You should receive this annual  immunization if you did not receive the immunization during your pregnancy. °Document Released: 10/06/2007 Document Revised: 09/02/2012 Document Reviewed: 08/05/2012  °ExitCare® Patient Information ©2015 [employer]. This information is not intended to replace advice given to you by your health care provider. Make sure you discuss any questions you have with your health care provider.  ° °Breastfeeding °Deciding to breastfeed is one of the best choices you can make for you and your baby. A change in hormones during pregnancy causes your breast tissue to grow and increases the number and size of your milk ducts. These hormones also allow proteins, sugars, and fats from your blood supply to make breast milk in your milk-producing glands. Hormones prevent breast milk from being released before your baby is born as well as prompt milk flow after birth. Once breastfeeding has begun, thoughts of your baby, as well as his or her sucking or crying, can stimulate the release of milk from your milk-producing glands.  °BENEFITS OF BREASTFEEDING °For Your Baby °· Your first milk (colostrum) helps your baby's digestive system function better.   °· There are antibodies in your milk that help your baby fight off infections.   °· Your baby has a lower incidence of asthma, allergies, and sudden infant death syndrome.   °· The nutrients in breast milk are better for your baby than infant formulas and are designed uniquely for your baby's needs.   °· Breast milk improves your baby's brain development.   °· Your baby is less likely to develop other conditions, such as childhood obesity, asthma, or type 2 diabetes mellitus.   °For You  °· Breastfeeding helps to create a very special bond between you and your baby.   °· Breastfeeding is convenient. Breast milk is always available at the correct temperature and costs nothing.   °· Breastfeeding helps to burn calories and helps you lose the weight gained during pregnancy.    °· Breastfeeding makes your uterus contract to its prepregnancy size faster and slows bleeding (lochia) after you give birth.   °· Breastfeeding helps to lower your risk of developing type 2 diabetes mellitus, osteoporosis, and breast or ovarian cancer later in life. °SIGNS THAT YOUR BABY IS HUNGRY °Early Signs of Hunger  °· Increased alertness or activity. °· Stretching. °· Movement of the head from side to side. °· Movement of the head and opening of the mouth when the corner of the mouth or cheek is stroked (rooting). °· Increased sucking sounds, smacking lips, cooing, sighing, or squeaking. °· Hand-to-mouth movements. °· Increased sucking of fingers or hands. °Late Signs of Hunger °· Fussing. °· Intermittent crying. °Extreme Signs of Hunger °Signs of extreme hunger will require calming and consoling before your baby will be able to breastfeed successfully. Do not wait for the following signs of extreme hunger to occur before you initiate breastfeeding:   °· Restlessness. °· A loud, strong cry. °·  Screaming. °BREASTFEEDING BASICS °Breastfeeding Initiation °· Find a comfortable place to sit or lie down, with your neck and back well supported. °· Place a pillow or   rolled up blanket under your baby to bring him or her to the level of your breast (if you are seated). Nursing pillows are specially designed to help support your arms and your baby while you breastfeed. °· Make sure that your baby's abdomen is facing your abdomen.   °· Gently massage your breast. With your fingertips, massage from your chest wall toward your nipple in a circular motion. This encourages milk flow. You may need to continue this action during the feeding if your milk flows slowly. °· Support your breast with 4 fingers underneath and your thumb above your nipple. Make sure your fingers are well away from your nipple and your baby's mouth.   °· Stroke your baby's lips gently with your finger or nipple.   °· When your baby's mouth is open  wide enough, quickly bring your baby to your breast, placing your entire nipple and as much of the colored area around your nipple (areola) as possible into your baby's mouth.   °¨ More areola should be visible above your baby's upper lip than below the lower lip.   °¨ Your baby's tongue should be between his or her lower gum and your breast.   °· Ensure that your baby's mouth is correctly positioned around your nipple (latched). Your baby's lips should create a seal on your breast and be turned out (everted). °· It is common for your baby to suck about 2-3 minutes in order to start the flow of breast milk. °Latching °Teaching your baby how to latch on to your breast properly is very important. An improper latch can cause nipple pain and decreased milk supply for you and poor weight gain in your baby. Also, if your baby is not latched onto your nipple properly, he or she may swallow some air during feeding. This can make your baby fussy. Burping your baby when you switch breasts during the feeding can help to get rid of the air. However, teaching your baby to latch on properly is still the best way to prevent fussiness from swallowing air while breastfeeding. °Signs that your baby has successfully latched on to your nipple:    °· Silent tugging or silent sucking, without causing you pain.   °· Swallowing heard between every 3-4 sucks.   °·  Muscle movement above and in front of his or her ears while sucking.   °Signs that your baby has not successfully latched on to nipple:  °· Sucking sounds or smacking sounds from your baby while breastfeeding. °· Nipple pain. °If you think your baby has not latched on correctly, slip your finger into the corner of your baby's mouth to break the suction and place it between your baby's gums. Attempt breastfeeding initiation again. °Signs of Successful Breastfeeding °Signs from your baby:   °· A gradual decrease in the number of sucks or complete cessation of sucking.   °· Falling  asleep.   °· Relaxation of his or her body.   °· Retention of a small amount of milk in his or her mouth.   °· Letting go of your breast by himself or herself. °Signs from you: °· Breasts that have increased in firmness, weight, and size 1-3 hours after feeding.   °· Breasts that are softer immediately after breastfeeding. °· Increased milk volume, as well as a change in milk consistency and color by the fifth day of breastfeeding.   °· Nipples that are not sore, cracked, or bleeding. °Signs That Your Baby is Getting Enough Milk °· Wetting at least 3 diapers in a 24-hour period. The urine should be clear and   pale yellow by age 5 days. °· At least 3 stools in a 24-hour period by age 5 days. The stool should be soft and yellow. °· At least 3 stools in a 24-hour period by age 7 days. The stool should be seedy and yellow. °· No loss of weight greater than 10% of birth weight during the first 3 days of age. °· Average weight gain of 4-7 ounces (113-198 g) per week after age 4 days. °· Consistent daily weight gain by age 5 days, without weight loss after the age of 2 weeks. °After a feeding, your baby may spit up a small amount. This is common. °BREASTFEEDING FREQUENCY AND DURATION °Frequent feeding will help you make more milk and can prevent sore nipples and breast engorgement. Breastfeed when you feel the need to reduce the fullness of your breasts or when your baby shows signs of hunger. This is called "breastfeeding on demand." Avoid introducing a pacifier to your baby while you are working to establish breastfeeding (the first 4-6 weeks after your baby is born). After this time you may choose to use a pacifier. Research has shown that pacifier use during the first year of a baby's life decreases the risk of sudden infant death syndrome (SIDS). °Allow your baby to feed on each breast as long as he or she wants. Breastfeed until your baby is finished feeding. When your baby unlatches or falls asleep while feeding from  the first breast, offer the second breast. Because newborns are often sleepy in the first few weeks of life, you may need to awaken your baby to get him or her to feed. °Breastfeeding times will vary from baby to baby. However, the following rules can serve as a guide to help you ensure that your baby is properly fed: °· Newborns (babies 4 weeks of age or younger) may breastfeed every 1-3 hours. °· Newborns should not go longer than 3 hours during the day or 5 hours during the night without breastfeeding. °· You should breastfeed your baby a minimum of 8 times in a 24-hour period until you begin to introduce solid foods to your baby at around 6 months of age. °BREAST MILK PUMPING °Pumping and storing breast milk allows you to ensure that your baby is exclusively fed your breast milk, even at times when you are unable to breastfeed. This is especially important if you are going back to work while you are still breastfeeding or when you are not able to be present during feedings. Your lactation consultant can give you guidelines on how long it is safe to store breast milk.  °A breast pump is a machine that allows you to pump milk from your breast into a sterile bottle. The pumped breast milk can then be stored in a refrigerator or freezer. Some breast pumps are operated by hand, while others use electricity. Ask your lactation consultant which type will work best for you. Breast pumps can be purchased, but some hospitals and breastfeeding support groups lease breast pumps on a monthly basis. A lactation consultant can teach you how to hand express breast milk, if you prefer not to use a pump.  °CARING FOR YOUR BREASTS WHILE YOU BREASTFEED °Nipples can become dry, cracked, and sore while breastfeeding. The following recommendations can help keep your breasts moisturized and healthy: °· Avoid using soap on your nipples.   °· Wear a supportive bra. Although not required, special nursing bras and tank tops are designed to  allow access to your breasts for   breastfeeding without taking off your entire bra or top. Avoid wearing underwire-style bras or extremely tight bras. °· Air dry your nipples for 3-4 minutes after each feeding.   °· Use only cotton bra pads to absorb leaked breast milk. Leaking of breast milk between feedings is normal.   °· Use lanolin on your nipples after breastfeeding. Lanolin helps to maintain your skin's normal moisture barrier. If you use pure lanolin, you do not need to wash it off before feeding your baby again. Pure lanolin is not toxic to your baby. You may also hand express a few drops of breast milk and gently massage that milk into your nipples and allow the milk to air dry. °In the first few weeks after giving birth, some women experience extremely full breasts (engorgement). Engorgement can make your breasts feel heavy, warm, and tender to the touch. Engorgement peaks within 3-5 days after you give birth. The following recommendations can help ease engorgement: °· Completely empty your breasts while breastfeeding or pumping. You may want to start by applying warm, moist heat (in the shower or with warm water-soaked hand towels) just before feeding or pumping. This increases circulation and helps the milk flow. If your baby does not completely empty your breasts while breastfeeding, pump any extra milk after he or she is finished. °· Wear a snug bra (nursing or regular) or tank top for 1-2 days to signal your body to slightly decrease milk production. °· Apply ice packs to your breasts, unless this is too uncomfortable for you. °· Make sure that your baby is latched on and positioned properly while breastfeeding. °If engorgement persists after 48 hours of following these recommendations, contact your health care provider or a lactation consultant. °OVERALL HEALTH CARE RECOMMENDATIONS WHILE BREASTFEEDING °· Eat healthy foods. Alternate between meals and snacks, eating 3 of each per day. Because what you  eat affects your breast milk, some of the foods may make your baby more irritable than usual. Avoid eating these foods if you are sure that they are negatively affecting your baby. °· Drink milk, fruit juice, and water to satisfy your thirst (about 10 glasses a day).   °· Rest often, relax, and continue to take your prenatal vitamins to prevent fatigue, stress, and anemia. °· Continue breast self-awareness checks. °· Avoid chewing and smoking tobacco. °· Avoid alcohol and drug use. °Some medicines that may be harmful to your baby can pass through breast milk. It is important to ask your health care provider before taking any medicine, including all over-the-counter and prescription medicine as well as vitamin and herbal supplements. °It is possible to become pregnant while breastfeeding. If birth control is desired, ask your health care provider about options that will be safe for your baby. °SEEK MEDICAL CARE IF:  °· You feel like you want to stop breastfeeding or have become frustrated with breastfeeding. °· You have painful breasts or nipples. °· Your nipples are cracked or bleeding. °· Your breasts are red, tender, or warm. °· You have a swollen area on either breast. °· You have a fever or chills. °· You have nausea or vomiting. °· You have drainage other than breast milk from your nipples. °· Your breasts do not become full before feedings by the fifth day after you give birth. °· You feel sad and depressed. °· Your baby is too sleepy to eat well. °· Your baby is having trouble sleeping.   °· Your baby is wetting less than 3 diapers in a 24-hour period. °· Your baby has   less than 3 stools in a 24-hour period. °· Your baby's skin or the white part of his or her eyes becomes yellow.   °· Your baby is not gaining weight by 5 days of age. °SEEK IMMEDIATE MEDICAL CARE IF:  °· Your baby is overly tired (lethargic) and does not want to wake up and feed. °· Your baby develops an unexplained fever. °Document Released:  12/09/2005 Document Revised: 12/14/2013 Document Reviewed: 06/02/2013 °[employer]® Patient Information ©2015 [employer]. This information is not intended to replace advice given to you by your health care provider. Make sure you discuss any questions you have with your health care provider. ° °

## 2014-06-19 NOTE — Lactation Note (Signed)
This note was copied from the chart of Sharon Rowland. Lactation Consultation Note  Patient Name: Sharon Rowland Date: 06/19/2014 Reason for consult: Initial assessment Per mom the baby has been cluster feeding all night and this am. LC observed baby already latched and feeding with depth and multiply swallows , per mom comfortable. This is moms 4th baby , and denies soreness, ore in the past engorgement challenges, LC reviewed sore nipple and engorgement prevention and tx.Mother informed of post-discharge support and given  phone number to the lactation department, including services for phone call assistance; out-patient appointments;  and breastfeeding support group. List of other breastfeeding resources in the community given in the handout.  Encouraged mother to call for problems or concerns related to breastfeeding.   Maternal Data Formula Feeding for Exclusion: No Infant to breast within first hour of birth: Yes  Feeding Feeding Type: Breast Fed Length of feed: 5 min  LATCH Score/Interventions Latch: Grasps breast easily, tongue down, lips flanged, rhythmical sucking. Intervention(s): Adjust position;Assist with latch;Breast massage;Breast compression  Audible Swallowing: Spontaneous and intermittent Intervention(s): Skin to skin;Hand expression  Type of Nipple: Everted at rest and after stimulation  Comfort (Breast/Nipple): Soft / non-tender     Hold (Positioning): No assistance needed to correctly position infant at breast. Intervention(s): Breastfeeding basics reviewed;Support Pillows;Position options;Skin to skin  LATCH Score: 10  Lactation Tools Discussed/Used Tools:  (per mom has her own DEBP ) Pump Review: Milk Storage Initiated by:: MAI  Date initiated:: 06/19/14   Consult Status Consult Status: Complete    Kathrin Greathouse 06/19/2014, 1:26 PM

## 2014-06-19 NOTE — Discharge Summary (Signed)
Obstetric Discharge Summary Reason for Admission: onset of labor Prenatal Procedures: ultrasound, lopressor for paroxysmal SVT Intrapartum Procedures: spontaneous vaginal delivery and GBS prophylaxis Postpartum Procedures: none Complications-Operative and Postpartum: none Eating, drinking, voiding, ambulating well.  +flatus.  Lochia and pain wnl.  Denies dizziness, lightheadedness, or sob. No complaints. Desires early d/c.  Hospital Course: Sharon Rowland is a 28 y.o. [id number] female admited at 49.1wks. She progressed to fully dilated and needed pitocin augmentation during 2nd stage. She went on to have a NSVB w/o complications. Her pp course has been uncomplicated.  By PPD#1 she is doing well and is deemed to have received the full benefit of her hospital stay.  Filed Vitals:   06/19/14 0523  BP: 96/59  Pulse: 56  Temp: 97.5 F (36.4 C)  Resp: 19   H/H: Lab Results  Component Value Date/Time   HGB 10.7* 06/17/2014  8:10 PM   HCT 33.3* 06/17/2014  8:10 PM    Physical Exam: General: alert, cooperative and no distress Abdomen/Uterine Fundus: Appropriately tender, non-distended, FF @ U-2 Incision: n/a Lochia: appropriate Extremities: No evidence of DVT seen on physical exam. Negative Homan's sign, no cords, calf tenderness, or significant calf/ankle edema   Discharge Diagnoses: Term Pregnancy-delivered  Discharge Information: Date: 07/04/2011 Activity: pelvic rest Diet: routine  Medications: PNV and Ibuprofen, wean Lopressor as pt previously discussed w/ Dr. Despina Hidden Breast feeding: Yes Contraception: mirena Circumcision: at FT Condition: stable Instructions: refer to handout Discharge to: home  Infant: Home with mother  Follow-up Information   Follow up with [facility]. (as scheduled, then in 4-6 weeks for your postpartum visit. Call to have the circumcision scheduled)    Specialty:  Obstetrics and Gynecology   Contact information:   [address] [place] [address] [phone]      Marge Duncans, CNM, WHNP-BC 06/19/2014,9:00 AM

## 2014-06-20 LAB — RH IG WORKUP (INCLUDES ABO/RH)
ABO/RH(D): A NEG
Antibody Screen: POSITIVE
DAT, IgG: NEGATIVE
Fetal Screen: NEGATIVE
GESTATIONAL AGE(WKS): 40.2
UNIT DIVISION: 0

## 2014-06-20 NOTE — Discharge Summary (Signed)

## 2014-06-22 ENCOUNTER — Inpatient Hospital Stay (HOSPITAL_COMMUNITY): Admission: RE | Admit: 2014-06-22 | Payer: BC Managed Care – PPO | Source: Ambulatory Visit

## 2014-07-07 ENCOUNTER — Encounter: Payer: Self-pay | Admitting: Advanced Practice Midwife

## 2014-07-07 ENCOUNTER — Ambulatory Visit (INDEPENDENT_AMBULATORY_CARE_PROVIDER_SITE_OTHER): Payer: BC Managed Care – PPO | Admitting: Advanced Practice Midwife

## 2014-07-07 VITALS — BP 112/70 | Ht 64.0 in | Wt 168.0 lb

## 2014-07-07 DIAGNOSIS — I471 Supraventricular tachycardia: Secondary | ICD-10-CM

## 2014-07-07 NOTE — Progress Notes (Signed)
[facility] Visit  Patient name: Sharon Rowland MRN [id number]  Date of birth: August 24, 1986  CC & HPI:  Sharon Rowland is a 28 y.o. Caucasian female presenting today for review of medication for SVT.  SHe is 3 weeks pp vaginal delivery.  She had been on Lopresser 50mg  BID prior to pregnancy, and was up to 100mg  BID at end of pregnancy.  Since delivery, she has not had any runs of SVT. Desires to drop to 75 BID  Pertinent History Reviewed:  Medical & Surgical Hx:   Past Medical History  Diagnosis Date  . PSVT (paroxysmal supraventricular tachycardia)   . Allergic rhinitis   . Pelvic pain in antepartum period in second trimester 01/17/2014  . Asthma     last used inhsler 1 month ago   History reviewed. No pertinent past surgical history. Medications: Reviewed & Updated - see associated section Social History: Reviewed -  reports that she has never smoked. She has never used smokeless tobacco.  Objective Findings:  Vitals: BP 112/70  Ht 5\' 4"  (1.626 m)  Wt 168 lb (76.204 kg)  BMI 28.82 kg/m2  Breastfeeding? Yes  Physical Examination: General appearance - alert, well appearing, and in no distress Mental status - alert, oriented to person, place, and time Heart - normal rate and regular rhythm  No results found for this or any previous visit (from the past 24 hour(s)).   Assessment & Plan:  A:   SVT, lessening medication requirements after delivery P:  Decrease to 75 BID with goal of 50 BID   F/U 2weeks  for PP check and 3 weeks for IUD   CRESENZO-DISHMAN,[name] CNM 07/07/2014 10:06 AM

## 2014-07-26 ENCOUNTER — Ambulatory Visit (INDEPENDENT_AMBULATORY_CARE_PROVIDER_SITE_OTHER): Payer: BC Managed Care – PPO | Admitting: Advanced Practice Midwife

## 2014-07-26 ENCOUNTER — Encounter: Payer: Self-pay | Admitting: Advanced Practice Midwife

## 2014-07-26 NOTE — Progress Notes (Signed)
  Sharon Rowland is a 28 y.o. who presents for a postpartum visit. She is 6 weeks postpartum following a spontaneous vaginal delivery. I have fully reviewed the prenatal and intrapartum course. The delivery was at 40.2 gestational weeks.  Anesthesia: epidural. Postpartum course has been uneventful.  She has been able to lower her Lopresser to 100 mg/day and has only rare runs of SVT. Baby's course has been uneventful. Baby is feeding by breast. Bleeding: no bleeding. Bowel function is normal. Bladder function is normal. Patient is not sexually active. Contraception method is IUD. Postpartum depression screening: negative.    Review of Systems   Constitutional: Negative for fever and chills Eyes: Negative for visual disturbances Respiratory: Negative for shortness of breath, dyspnea Cardiovascular: Negative for chest pain or palpitations  Gastrointestinal: Negative for vomiting, diarrhea and constipation Genitourinary: Negative for dysuria and urgency Musculoskeletal: Negative for back pain, joint pain, myalgias  Neurological: Negative for dizziness and headaches   Objective:     Filed Vitals:   07/26/14 0954  BP: 90/70   General:  alert, cooperative and no distress   Breasts:  negative  Lungs: clear to auscultation bilaterally  Heart:  regular rate and rhythm  Abdomen: Soft, nontender   Vulva:  normal  Vagina: normal vagina  Cervix:  closed  Corpus: Well involuted     Rectal Exam: no hemorrhoids        Assessment:    normal postpartum exam.  Plan:    1. Contraception: IUD 2. Follow up in: 1 week for IUD and 4 weeks for Colpo

## 2014-07-28 ENCOUNTER — Other Ambulatory Visit: Payer: Self-pay | Admitting: *Deleted

## 2014-07-29 MED ORDER — METOPROLOL TARTRATE 100 MG PO TABS: 100.0000 mg | ORAL_TABLET | Freq: Two times a day (BID) | ORAL | Status: DC

## 2014-08-04 ENCOUNTER — Ambulatory Visit: Payer: BC Managed Care – PPO | Admitting: Advanced Practice Midwife

## 2014-08-12 ENCOUNTER — Encounter: Payer: Self-pay | Admitting: Internal Medicine

## 2014-08-12 ENCOUNTER — Encounter: Payer: Self-pay | Admitting: *Deleted

## 2014-08-12 ENCOUNTER — Ambulatory Visit (INDEPENDENT_AMBULATORY_CARE_PROVIDER_SITE_OTHER): Payer: BC Managed Care – PPO | Admitting: Internal Medicine

## 2014-08-12 VITALS — BP 102/70 | HR 62 | Ht 64.0 in | Wt 163.0 lb

## 2014-08-12 DIAGNOSIS — Z8679 Personal history of other diseases of the circulatory system: Secondary | ICD-10-CM

## 2014-08-12 DIAGNOSIS — Z9889 Other specified postprocedural states: Secondary | ICD-10-CM

## 2014-08-12 DIAGNOSIS — I471 Supraventricular tachycardia: Secondary | ICD-10-CM

## 2014-08-12 MED ORDER — METOPROLOL TARTRATE 100 MG PO TABS: 100.0000 mg | ORAL_TABLET | Freq: Two times a day (BID) | ORAL | Status: DC

## 2014-08-12 NOTE — Patient Instructions (Signed)
Your physician has recommended that you have an ablation. Catheter ablation is a medical procedure used to treat some cardiac arrhythmias (irregular heartbeats). During catheter ablation, a long, thin, flexible tube is put into a blood vessel in your groin (upper thigh), or neck. This tube is called an ablation catheter. It is then guided to your heart through the blood vessel. Radio frequency waves destroy small areas of heart tissue where abnormal heartbeats may cause an arrhythmia to start. Please see the instruction sheet given to you today.  Your physician recommends that you continue on your current medications as directed. Please refer to the Current Medication list given to you today.  Possible dates for ablation: 08/29/14, 09/05/14, 09/07/14, 09/09/14, 09/14/14, 09/19/14, 09/22/14  We have given you an instruction sheet for your ablation.  Thank you for choosing [facility]!!

## 2014-08-14 ENCOUNTER — Encounter: Payer: Self-pay | Admitting: Internal Medicine

## 2014-08-14 NOTE — Progress Notes (Signed)
HPI Ms. Pinkston returns today for followup. She is s/p delivery of her 4th child. She has a h/o SVT which has been fairly poorly documented as the patient has tried to avoid hospital/er care for concerns of cost. She had a cardiac monitor placed several years ago in [place] and I have finally been able to review this. It does show what appears to be SVT at rates of up to 190/min. She also appears to have sinus tachycardia though the actual mechanism of her arrhythmia is still a bit uncertain. Since she delivered, she has not had any sustained arrhythmias. She has noted previously that her palpitations can be controlled with vagal maneuvers. She took fairly high doses of metoprolol during pregnancy despite my concerns about her baby but mother and child appear to be doing well. She is interested in proceeding with catheter ablation.  Allergies  Allergen Reactions  . Latex Dermatitis     Current Outpatient Prescriptions  Medication Sig Dispense Refill  . albuterol (PROVENTIL HFA;VENTOLIN HFA) 108 (90 BASE) MCG/ACT inhaler Inhale 2 puffs into the lungs as needed for wheezing or shortness of breath.      Marland Kitchen ibuprofen (ADVIL,MOTRIN) 600 MG tablet Take 1 tablet (600 mg total) by mouth every 6 (six) hours as needed.  30 tablet  0  . metoprolol (LOPRESSOR) 100 MG tablet Take 1 tablet (100 mg total) by mouth 2 (two) times daily.  60 tablet  11   No current facility-administered medications for this visit.     Past Medical History  Diagnosis Date  . PSVT (paroxysmal supraventricular tachycardia)   . Allergic rhinitis   . Pelvic pain in antepartum period in second trimester 01/17/2014  . Asthma     last used inhsler 1 month ago    ROS:   All systems reviewed and negative except as noted in the HPI.   History reviewed. No pertinent past surgical history.   Family History  Problem Relation Age of Onset  . Hypertension Mother   . Hyperlipidemia Mother   . Multiple sclerosis Mother    . COPD Mother   . Cancer Mother     melanoma x 2  . Diabetes Mother   . Heart disease Father     PSVT  . Hyperlipidemia Brother   . Hypertension Brother   . Heart disease Maternal Grandfather     heart attack  . Cancer Paternal Grandmother     pancreatic cancer  . Heart disease Paternal Grandmother     MI  . Hyperlipidemia Paternal Grandmother   . Hypertension Paternal Grandmother      History   Social History  . Marital Status: Married    Spouse Name: N/A    Number of Children: N/A  . Years of Education: N/A   Occupational History  . Not on file.   Social History Main Topics  . Smoking status: Never Smoker   . Smokeless tobacco: Never Used  . Alcohol Use: No  . Drug Use: No  . Sexual Activity: No   Other Topics Concern  . Not on file   Social History Narrative  . No narrative on file     BP 102/70  Pulse 62  Ht  (1.626 m)  Wt 163 lb (73.936 kg)  BMI 27.97 kg/m2  Physical Exam:  Well appearing young woman, NAD HEENT: Unremarkable Neck:  No JVD, no thyromegally Back:  No CVA tenderness Lungs:  Clear with no wheezes HEART:  Regular rate  rhythm, no murmurs, no rubs, no clicks Abd:  soft, positive bowel sounds, no organomegally, no rebound, no guarding Ext:  2 plus pulses, no edema, no cyanosis, no clubbing Skin:  No rashes no nodules Neuro:  CN II through XII intact, motor grossly intact  EKG - nsr   Assess/Plan:

## 2014-08-14 NOTE — Assessment & Plan Note (Signed)
She is doing well after delivery. She would like to proceed with invasive EP study and catheter ablation. She would like to get off of her beta blockers. I have counseled her on the limitations of ablation. Because she has been so reluctant to seek medical attention during her episodes of palpitations and because she has no documented 12 lead ECG of SVT, I have warned her that there is a chance that we will not be able to induce any SVT. She understands. She is convinced that she has SVT. Her symptoms are resolved with vagal maneuvers.

## 2014-08-30 ENCOUNTER — Telehealth: Payer: Self-pay | Admitting: *Deleted

## 2014-08-30 ENCOUNTER — Telehealth: Payer: Self-pay | Admitting: Internal Medicine

## 2014-08-30 NOTE — Telephone Encounter (Signed)
Patient is wanting to schedule her ablation.  / tgs

## 2014-08-30 NOTE — Telephone Encounter (Signed)
Pt called she can't have ablation till January 2016 due to her having no FMLA left from work. Ryan in the cath lab said to call back and schedule ablation in December he get the schedule on a monthly basis. Pt understood

## 2014-10-24 ENCOUNTER — Encounter: Payer: Self-pay | Admitting: Internal Medicine

## 2014-11-04 ENCOUNTER — Encounter: Payer: BC Managed Care – PPO | Admitting: Nurse Practitioner

## 2014-12-07 ENCOUNTER — Encounter: Payer: Self-pay | Admitting: *Deleted

## 2015-01-05 ENCOUNTER — Ambulatory Visit (HOSPITAL_COMMUNITY)
Admission: RE | Admit: 2015-01-05 | Discharge: 2015-01-05 | Disposition: A | Discharge status: Home / Self Care | Payer: BLUE CROSS/BLUE SHIELD | Source: Ambulatory Visit | Attending: Internal Medicine | Admitting: Internal Medicine

## 2015-01-05 ENCOUNTER — Encounter (HOSPITAL_COMMUNITY): Payer: Self-pay | Admitting: General Practice

## 2015-01-05 ENCOUNTER — Encounter (HOSPITAL_COMMUNITY)
Admission: RE | Disposition: A | Discharge status: Home / Self Care | Payer: Self-pay | Source: Ambulatory Visit | Attending: Internal Medicine

## 2015-01-05 DIAGNOSIS — J45909 Unspecified asthma, uncomplicated: Secondary | ICD-10-CM | POA: Insufficient documentation

## 2015-01-05 DIAGNOSIS — I471 Supraventricular tachycardia, unspecified: Secondary | ICD-10-CM | POA: Diagnosis present

## 2015-01-05 DIAGNOSIS — Z91040 Latex allergy status: Secondary | ICD-10-CM | POA: Diagnosis not present

## 2015-01-05 HISTORY — PX: SUPRAVENTRICULAR TACHYCARDIA ABLATION: SHX5492

## 2015-01-05 HISTORY — PX: SUPRAVENTRICULAR TACHYCARDIA ABLATION: SHX6106

## 2015-01-05 HISTORY — DX: Gastro-esophageal reflux disease without esophagitis: K21.9

## 2015-01-05 HISTORY — DX: Anemia, unspecified: D64.9

## 2015-01-05 LAB — BASIC METABOLIC PANEL
Anion gap: 6 (ref 5–15)
BUN: 10 mg/dL (ref 6–23)
CALCIUM: 9.5 mg/dL (ref 8.4–10.5)
CO2: 25 mmol/L (ref 19–32)
Chloride: 108 mEq/L (ref 96–112)
Creatinine, Ser: 0.79 mg/dL (ref 0.50–1.10)
Glucose, Bld: 99 mg/dL (ref 70–99)
POTASSIUM: 3.8 mmol/L (ref 3.5–5.1)
Sodium: 139 mmol/L (ref 135–145)

## 2015-01-05 LAB — CBC
HEMATOCRIT: 39.3 % (ref 36.0–46.0)
Hemoglobin: 13.1 g/dL (ref 12.0–15.0)
MCH: 27.4 pg (ref 26.0–34.0)
MCHC: 33.3 g/dL (ref 30.0–36.0)
MCV: 82.2 fL (ref 78.0–100.0)
Platelets: 198 10*3/uL (ref 150–400)
RBC: 4.78 MIL/uL (ref 3.87–5.11)
RDW: 14.5 % (ref 11.5–15.5)
WBC: 7.2 10*3/uL (ref 4.0–10.5)

## 2015-01-05 LAB — PREGNANCY, URINE: Preg Test, Ur: NEGATIVE

## 2015-01-05 LAB — PROTIME-INR
INR: 0.99 (ref 0.00–1.49)
PROTHROMBIN TIME: 13.2 s (ref 11.6–15.2)

## 2015-01-05 SURGERY — SUPRAVENTRICULAR TACHYCARDIA ABLATION
Anesthesia: LOCAL

## 2015-01-05 MED ORDER — SODIUM CHLORIDE 0.9 % IJ SOLN: 3.0000 mL | INTRAMUSCULAR | Status: DC | PRN

## 2015-01-05 MED ORDER — BUPIVACAINE HCL (PF) 0.25 % IJ SOLN
INTRAMUSCULAR | Status: AC
  Filled 2015-01-05: qty 30

## 2015-01-05 MED ORDER — ONDANSETRON HCL 4 MG/2ML IJ SOLN: 4.0000 mg | Freq: Four times a day (QID) | INTRAMUSCULAR | Status: DC | PRN

## 2015-01-05 MED ORDER — FENTANYL CITRATE 0.05 MG/ML IJ SOLN
INTRAMUSCULAR | Status: AC
  Filled 2015-01-05: qty 2

## 2015-01-05 MED ORDER — METOPROLOL TARTRATE 100 MG PO TABS
100.0000 mg | ORAL_TABLET | Freq: Two times a day (BID) | ORAL | Status: DC
  Administered 2015-01-05: 100 mg via ORAL
  Filled 2015-01-05 (×2): qty 1

## 2015-01-05 MED ORDER — MIDAZOLAM HCL 5 MG/5ML IJ SOLN
INTRAMUSCULAR | Status: AC
  Filled 2015-01-05: qty 5

## 2015-01-05 MED ORDER — SODIUM CHLORIDE 0.9 % IV SOLN
2.0000 ug/min | INTRAVENOUS | Status: AC
  Administered 2015-01-05: 2 ug/min via INTRAVENOUS
  Filled 2015-01-05: qty 2

## 2015-01-05 MED ORDER — SODIUM CHLORIDE 0.9 % IJ SOLN: 3.0000 mL | Freq: Two times a day (BID) | INTRAMUSCULAR | Status: DC

## 2015-01-05 MED ORDER — SODIUM CHLORIDE 0.9 % IV SOLN: 250.0000 mL | INTRAVENOUS | Status: DC | PRN

## 2015-01-05 MED ORDER — HEPARIN (PORCINE) IN NACL 2-0.9 UNIT/ML-% IJ SOLN
INTRAMUSCULAR | Status: AC
  Filled 2015-01-05: qty 500

## 2015-01-05 MED ORDER — ACETAMINOPHEN 325 MG PO TABS
650.0000 mg | ORAL_TABLET | ORAL | Status: DC | PRN
  Administered 2015-01-05: 16:00:00 650 mg via ORAL
  Filled 2015-01-05: qty 2

## 2015-01-05 MED ORDER — SODIUM CHLORIDE 0.9 % IV SOLN
INTRAVENOUS | Status: DC
  Administered 2015-01-05: 07:00:00 via INTRAVENOUS

## 2015-01-05 NOTE — Progress Notes (Signed)
Pt was concerned about meds crossing over in her breast milk.  Spoke with Christiane Ha, pharmacist in main pharmacy, asked about using Fentanyl, Versed and Isopril during the procedure how that would affect her breast milk and crossing over to the baby.  He said would discard the breast milk for at least 1st 6-8hr prefer discard for at least 12hrs.

## 2015-01-05 NOTE — H&P (Signed)
HPI Sharon Rowland returns today for followup. She is s/p delivery of her 4th child. She has a h/o SVT which has been fairly poorly documented as the patient has tried to avoid hospital/er care for concerns of cost. She had a cardiac monitor placed several years ago in [place] and I have finally been able to review this. It does show what appears to be SVT at rates of up to 190/min. She also appears to have sinus tachycardia though the actual mechanism of her arrhythmia is still a bit uncertain. Since she delivered, she has not had any sustained arrhythmias. She has noted previously that her palpitations can be controlled with vagal maneuvers. She took fairly high doses of metoprolol during pregnancy despite my concerns about her baby but mother and child appear to be doing well. She is interested in proceeding with catheter ablation.  Allergies  Allergen Reactions  . Latex Dermatitis     Current Outpatient Prescriptions  Medication Sig Dispense Refill  . albuterol (PROVENTIL HFA;VENTOLIN HFA) 108 (90 BASE) MCG/ACT inhaler Inhale 2 puffs into the lungs as needed for wheezing or shortness of breath.    Marland Kitchen ibuprofen (ADVIL,MOTRIN) 600 MG tablet Take 1 tablet (600 mg total) by mouth every 6 (six) hours as needed. 30 tablet 0  . metoprolol (LOPRESSOR) 100 MG tablet Take 1 tablet (100 mg total) by mouth 2 (two) times daily. 60 tablet 11   No current facility-administered medications for this visit.     Past Medical History  Diagnosis Date  . PSVT (paroxysmal supraventricular tachycardia)   . Allergic rhinitis   . Pelvic pain in antepartum period in second trimester 01/17/2014  . Asthma     last used inhsler 1 month ago    ROS:  All systems reviewed and negative except as noted in the HPI.   History reviewed. No pertinent past surgical history.   Family History  Problem Relation Age of Onset  . Hypertension Mother    . Hyperlipidemia Mother   . Multiple sclerosis Mother   . COPD Mother   . Cancer Mother     melanoma x 2  . Diabetes Mother   . Heart disease Father     PSVT  . Hyperlipidemia Brother   . Hypertension Brother   . Heart disease Maternal Grandfather     heart attack  . Cancer Paternal Grandmother     pancreatic cancer  . Heart disease Paternal Grandmother     MI  . Hyperlipidemia Paternal Grandmother   . Hypertension Paternal Grandmother      History   Social History  . Marital Status: Married    Spouse Name: N/A    Number of Children: N/A  . Years of Education: N/A   Occupational History  . Not on file.   Social History Main Topics  . Smoking status: Never Smoker   . Smokeless tobacco: Never Used  . Alcohol Use: No  . Drug Use: No  . Sexual Activity: No   Other Topics Concern  . Not on file   Social History Narrative  . No narrative on file     BP 102/70  Pulse 62  Ht  (1.626 m)  Wt 163 lb (73.936 kg)  BMI 27.97 kg/m2  Physical Exam:  Well appearing young woman, NAD HEENT: Unremarkable Neck: No JVD, no thyromegally Back: No CVA tenderness Lungs: Clear with no wheezes HEART: Regular rate rhythm, no murmurs, no rubs, no clicks Abd: soft, positive bowel sounds, no organomegally, no  rebound, no guarding Ext: 2 plus pulses, no edema, no cyanosis, no clubbing Skin: No rashes no nodules Neuro: CN II through XII intact, motor grossly intact  EKG - nsr   Assess/Plan:            Paroxysmal SVT (supraventricular tachycardia) - Marinus Maw, MD at 08/14/2014 9:56 PM     Status: Written Related Problem: Paroxysmal SVT (supraventricular tachycardia)   Expand All Collapse All   She is doing well after delivery. She would like to proceed with invasive EP study and catheter ablation. She would like to get off of her  beta blockers. I have counseled her on the limitations of ablation. Because she has been so reluctant to seek medical attention during her episodes of palpitations and because she has no documented 12 lead ECG of SVT, I have warned her that there is a chance that we will not be able to induce any SVT. She understands. She is convinced that she has SVT. Her symptoms are resolved with vagal maneuvers.       EP Attending  Patient seen and examined. Agree with above findings. Since I last saw her in clinic, there has been no change. The patient wishes to proceed with cathter ablation. She is still breast feeding and I have recommended that she hold off returning to breast feeding for 12 hours after her last dose of versed and fentanyl.  Sharon Rowland.D.

## 2015-01-05 NOTE — CV Procedure (Signed)
Electrophysiology procedure note  Procedure: Invasive electrophysiologic study using Isuprel  Indication: Symptomatic SVT, refractory to medical therapy  Preprocedure diagnoses: Symptomatic SVT refractory to medical therapy  Postprocedure diagnosis: Inappropriate sinus tachycardia  Description of the procedure: After informed consent was obtained, the patient was taken to the diagnostic electrophysiology laboratory in the fasting state. After the usual preparation and draping, intravenous Versed and fentanyl was used for sedation. A 6 French hexapolar catheter was inserted percutaneously into the right jugular vein and advanced to the coronary sinus. A 6 French quadripolar catheter was inserted percutaneously into the right femoral vein and advanced to the right ventricle. A 6 French quadripolar catheter was inserted percutaneously into the right femoral vein and advanced to the his bundle region. After measurement of the basic intervals, rapid ventricular pacing was carried out from the right ventricle, and stepwise decreased down to 490 ms where VA block was demonstrated. During rapid ventricular pacing, the atrial activation was midline and decremental. Next, programmed ventricular stimulation was carried out from the right ventricle at a pacing cycle length of 600 ms. The S1-S2 interval was stepwise decreased from 540 ms down to 520 ms, where ventricular refractoriness was observed. During programmed ventricular stimulation, atrial activation was midline and decremental. Next programmed atrial stimulation was carried out from the coronary sinus the patient cycle length of 600 ms. The S1-S2 interval was stepwise decreased from 540 ms down to 260 ms where the AV Node ERP was observed. During programmed atrial stimulation, there was an AH jump and multiple echo beats with a longer VA interval. There was no inducible SVT. Next, rapid atrial pacing was carried out from the coronary cycle length of 500 ms.  The patient interval was then decreased down to 360 ms, where AV WB was noted. During atrial pacing, the PR interval was greater than the RR interval but no SVT was induced. Next, Isuprel was infused at 1-4 mics/min and additional rapid atrial pacing was carried out at 600 ms and stepwise decreased down to 260 ms. During Isuprel infusion, there was a narrow QRS tachycardia. The VA interval was short, about 150 ms. PVCs were placed at the time of his bundle refractoriness. There was one PVC which appeared to pre-excite the atrium but not reproducibly so. Ventricular pacing during tachycardia resulted in a VAV conduction sequence. The tachycardia gradually slowed and did not come back. Following this, we paced the patient with varying doses of isuprel for 40 minutes and could never get any sustained SVT. She had 3-5 beats of a longish, short RP tachycardia with midline atrial activation.    Complications: There were no immediate procedure complications  Results. 1. Baseline ECG. The baseline ECG demonstrated normal sinus rhythm with no ventricular preexcitation. 2. Baseline intervals. The sinus node cycle length was 759 ms. The QRS duration was 84 ms. The HV interval was 49 ms, and the AH interval was 126 ms. 3. Rapid ventricular pacing. Rapid ventricular pacing was carried out from the right ventricle demonstrating VA block at a cycle length of 490 ms. During rapid ventricular pacing, the atrial activation was midline and decremental. 4. Programmed ventricular stimulation. Programmed ventricular stimulation was carried out from the right ventricle and the patient cycle length of 600 ms. The S1-S2 interval was stepwise decreased from 540 ms down to 520 ms, where ventricular refractoriness was observed. During programmed ventricular stimulation, the atrial activation was midline and decremental. 5. Rapid atrial pacing. Rapid atrial pacing was carried out from the coronary sinus and   the pacing cycle length  of 600 ms, and stepwise decreased down to 360 ms, resulting AV block. 6. Programmed atrial stimulation. Programmed atrial stimulation was carried out from the coronary sinus at a pacing cycle length of 600 ms. The S1-S2 interval was stepwise decreased from 540 ms, down to 260 ms. During programmed atrial stimulation, there is no inducible SVT initially. There were multiple AH jumps and echo beats. 7. Arrhythmias observed. Sinus tachycardia at 180/min on isuprel 8. Mapping. Mapping of the tachycardia demonstrated a short VA interval, approximately 150 ms and midline atrial activation.  9. Radiofrequency energy application. Because we could only induce sinus tachycardia which was sustained, RF energy was not delivered.  Conclusion. Only inducible tachycardia was sinus tachycardia. There was non-sustained short RP tachycardia which could not be mapped.   Gregg Taylor,M.D 

## 2015-01-05 NOTE — Progress Notes (Signed)
Site area: rt IJ venous sheath Site Prior to Removal:  Level  0 Pressure Applied For: 10 minutes Manual:   yes Patient Status During Pull:  stable Post Pull Site:  Level  0 Post Pull Instructions Given:  yes Post Pull Pulses Present: NA Dressing Applied:  tegaderm Bedrest begins @ NA Comments:  0 complications

## 2015-01-05 NOTE — Discharge Instructions (Signed)

## 2015-01-05 NOTE — Progress Notes (Addendum)
Site area: rt groin Site Prior to Removal:  Level  0 Pressure Applied For:  20 minutes Manual:   yes Patient Status During Pull:  stable Post Pull Site:  Level  0 Post Pull Instructions Given:  yes Post Pull Pulses Present: yes Dressing Applied:  tegaderm Bedrest begins @ 1050 Comments:  0 complications; IV saline locked

## 2015-01-27 ENCOUNTER — Encounter: Payer: Self-pay | Admitting: Family Medicine

## 2015-01-27 ENCOUNTER — Ambulatory Visit (INDEPENDENT_AMBULATORY_CARE_PROVIDER_SITE_OTHER): Payer: BLUE CROSS/BLUE SHIELD | Admitting: Family Medicine

## 2015-01-27 VITALS — BP 112/62 | Ht 64.0 in | Wt 160.6 lb

## 2015-01-27 DIAGNOSIS — R300 Dysuria: Secondary | ICD-10-CM

## 2015-01-27 LAB — POCT URINALYSIS DIPSTICK
SPEC GRAV UA: 1.02
pH, UA: 6

## 2015-01-27 MED ORDER — CIPROFLOXACIN HCL 250 MG PO TABS: 250.0000 mg | ORAL_TABLET | Freq: Two times a day (BID) | ORAL | Status: DC

## 2015-01-27 NOTE — Progress Notes (Signed)
   Subjective:    Patient ID: Sharon Rowland, female    DOB: December 18, 1986, 29 y.o.   MRN: [id number]  HPI Patient arrives with complaint of dysuria for 2 weeks. Has used OTC AZO.  Positive history of urinary tract infection. Burning at times with urination.  Increase frequency both daytime and nighttime.  No major fever or chills. No vomiting no flank pain. Review of Systems No rash some suprapubic pain no hip discomfort ROS otherwise negative    Objective:   Physical Exam  Alert no acute distress. Lungs clear. Heart regular rate and rhythm. H&T normal. No CVA tenderness. Abdomen benign.  Urinalysis 4-6 white blood cells per high-power field no at these      Assessment & Plan:  Impression urinary tract infection discussed length plan initiate antibiotics. Symptomatic care discussed. WSL

## 2015-02-03 ENCOUNTER — Encounter: Payer: BLUE CROSS/BLUE SHIELD | Admitting: Internal Medicine

## 2015-03-09 ENCOUNTER — Ambulatory Visit (INDEPENDENT_AMBULATORY_CARE_PROVIDER_SITE_OTHER): Payer: BLUE CROSS/BLUE SHIELD | Admitting: Nurse Practitioner

## 2015-03-09 ENCOUNTER — Encounter: Payer: Self-pay | Admitting: Nurse Practitioner

## 2015-03-09 VITALS — BP 116/72 | Temp 98.2°F | Ht 64.0 in | Wt 158.0 lb

## 2015-03-09 DIAGNOSIS — J4521 Mild intermittent asthma with (acute) exacerbation: Secondary | ICD-10-CM

## 2015-03-09 DIAGNOSIS — J329 Chronic sinusitis, unspecified: Secondary | ICD-10-CM

## 2015-03-09 MED ORDER — AMOXICILLIN 500 MG PO CAPS: 500.0000 mg | ORAL_CAPSULE | Freq: Three times a day (TID) | ORAL | Status: DC

## 2015-03-09 MED ORDER — FLUTICASONE PROPIONATE HFA 110 MCG/ACT IN AERO: 1.0000 | INHALATION_SPRAY | Freq: Two times a day (BID) | RESPIRATORY_TRACT | Status: DC

## 2015-03-09 NOTE — Patient Instructions (Signed)
nasacort AQ as directed zaditor eye drops 

## 2015-03-10 ENCOUNTER — Encounter: Payer: Self-pay | Admitting: Nurse Practitioner

## 2015-03-10 NOTE — Progress Notes (Signed)
Subjective:  Presents for c/o runny nose, sneezing and watery eyes x 2 weeks. Hoarseness x 4 d. History of asthma. 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 Producing brown/green sputum. Using albuterol inhaler twice per day x 2 weeks. No ear pain. No fever. Non smoker. Has infant; breastfeeding. Cannot take Claritin due to extreme drowsiness.  Objective:   BP 116/72 mmHg  Temp(Src) 98.2 F (36.8 C)  Ht 5\' 4"  (1.626 m)  Wt 158 lb (71.668 kg)  BMI 27.11 kg/m2 NAD. Alert, oriented. Conjunctivae clear. TMs significant clear effusion. Pharynx mildly injected with green PND noted. Neck supple with mild anterior adenopathy. Lungs clear. Heart RRR.   Assessment: Rhinosinusitis  Asthma with acute exacerbation, mild intermittent  Plan:  Meds ordered this encounter  Medications  . amoxicillin (AMOXIL) 500 MG capsule    Sig: Take 1 capsule (500 mg total) by mouth 3 (three) times daily.    Dispense:  30 capsule    Refill:  0    Order Specific Question:  Supervising Provider    Answer:  Merlyn Albert [[id number]]  . fluticasone (FLOVENT HFA) 110 MCG/ACT inhaler    Sig: Inhale 1 puff into the lungs 2 (two) times daily.    Dispense:  1 Inhaler    Refill:  5    Order Specific Question:  Supervising Provider    Answer:  Merlyn Albert [[id number]]   nasacort AQ as directed zaditor eye drops Call back in 4-5 days if no improvement, sooner if worse.

## 2015-04-26 ENCOUNTER — Ambulatory Visit (INDEPENDENT_AMBULATORY_CARE_PROVIDER_SITE_OTHER): Payer: BLUE CROSS/BLUE SHIELD | Admitting: Obstetrics and Gynecology

## 2015-04-26 ENCOUNTER — Encounter: Payer: Self-pay | Admitting: Obstetrics and Gynecology

## 2015-04-26 VITALS — BP 112/70 | Ht 64.0 in | Wt 158.0 lb

## 2015-04-26 DIAGNOSIS — Z30011 Encounter for initial prescription of contraceptive pills: Secondary | ICD-10-CM | POA: Diagnosis not present

## 2015-04-26 MED ORDER — NORETHINDRONE 0.35 MG PO TABS: 1.0000 | ORAL_TABLET | Freq: Every day | ORAL | Status: DC

## 2015-04-26 NOTE — Progress Notes (Signed)
Patient ID: Sharon Rowland, female   DOB: [birth year], 29 y.o.   MRN: [id number] Pt her today for heavy periods. Pt states that she has always had heavy periods. Pt states that she is breast feeding and she has just started having periods again and they are heavy again.

## 2015-04-26 NOTE — Progress Notes (Signed)
Patient ID: Sharon Rowland, female   DOB: [birth year], 29 y.o.   MRN: [id number]   [facility] Clinic Visit  Patient name: Sharon Rowland MRN [id number]  Date of birth: 21-Feb-1986  CC & HPI:  Sharon Rowland is a 29 y.o. female presenting today for menorrhagia. She also reports dyspareunia and premenstrual and postmenstrual suprapubic abdominal pain. She has always had heavy menses, but that went away with pregnancy and breastfeeding. Now that she is starting to stop breastfeeding, her periods have returned 3 months ago and are heavy again. They seem to be worse during work or during activity, and she had to leave work early today because she soaked through a heavy pad. Patient states that 3 out of 4 days of her menses are heavy.   She is considering progesterone pills to alleviate her menses. She has taken the Pill before, which effectively lightened her menses. Patient is highly interested in an endometrial ablation, but she is unsure whether she will have more children. Patient has a total of 4 children; although she does not want more children, her husband wants more children. She had her first 3 children with a different partner.   She had been told by a gynecologist that she may have endometriosis.   ROS:  A complete 10 system review of systems was obtained and all systems are negative except as noted in the HPI and PMH.    Pertinent History Reviewed:   Reviewed: Significant for n/a Medical         Past Medical History  Diagnosis Date  . PSVT (paroxysmal supraventricular tachycardia)   . Allergic rhinitis   . Pelvic pain in antepartum period in second trimester 01/17/2014  . Asthma     last used inhaler 1 month ago  . Anemia   . GERD (gastroesophageal reflux disease)                               Surgical Hx:    Past Surgical History  Procedure Laterality Date  . Supraventricular tachycardia ablation  01/05/2015    unsuccessful  . Supraventricular tachycardia ablation N/A  01/05/2015    Procedure: SUPRAVENTRICULAR TACHYCARDIA ABLATION;  Surgeon: Marinus Maw, MD;  Location: [facility] CATH LAB;  Service: Cardiovascular;  Laterality: N/A;   Medications: Reviewed & Updated - see associated section                       Current outpatient prescriptions:  .  albuterol (PROVENTIL HFA;VENTOLIN HFA) 108 (90 BASE) MCG/ACT inhaler, Inhale 2 puffs into the lungs every 6 (six) hours as needed for wheezing or shortness of breath. , Disp: , Rfl:  .  fluticasone (FLOVENT HFA) 110 MCG/ACT inhaler, Inhale 1 puff into the lungs 2 (two) times daily., Disp: 1 Inhaler, Rfl: 5 .  ibuprofen (ADVIL,MOTRIN) 600 MG tablet, Take 1 tablet (600 mg total) by mouth every 6 (six) hours as needed. (Patient taking differently: Take 600 mg by mouth every 6 (six) hours as needed (pain). ), Disp: 30 tablet, Rfl: 0 .  metoprolol (LOPRESSOR) 100 MG tablet, Take 1 tablet (100 mg total) by mouth 2 (two) times daily., Disp: 60 tablet, Rfl: 11   Social History: Reviewed -  reports that she has never smoked. She has never used smokeless tobacco.  Objective Findings:  Vitals: Blood pressure 112/70, height 5\' 4"  (1.626 m), weight 158 lb (71.668 kg), last menstrual period 04/22/2015, currently  breastfeeding.  Physical Examination: Patient is here for discussion only.   Assessment & Plan:   A:  1. Heavy menses; menorrhagia with dysmenorrhea.  P:  1. Rx minipill. 2. Gave [employer] information packet.     This chart was SCRIBED for Christin Bach, MD by Ronney Lion, ED Scribe. This patient was seen in room 2 and the patient's care was started at 2:26 PM.  I personally performed the services described in this documentation, which was SCRIBED in my presence. The recorded information has been reviewed and considered accurate. It has been edited as necessary during review. Tilda Burrow, MD

## 2015-05-05 ENCOUNTER — Ambulatory Visit (INDEPENDENT_AMBULATORY_CARE_PROVIDER_SITE_OTHER): Payer: BLUE CROSS/BLUE SHIELD | Admitting: Family Medicine

## 2015-05-05 ENCOUNTER — Encounter: Payer: Self-pay | Admitting: Family Medicine

## 2015-05-05 VITALS — BP 110/80 | Temp 98.3°F | Ht 64.0 in | Wt 155.4 lb

## 2015-05-05 DIAGNOSIS — J4521 Mild intermittent asthma with (acute) exacerbation: Secondary | ICD-10-CM

## 2015-05-05 DIAGNOSIS — J329 Chronic sinusitis, unspecified: Secondary | ICD-10-CM | POA: Diagnosis not present

## 2015-05-05 MED ORDER — AMOXICILLIN 500 MG PO CAPS: 500.0000 mg | ORAL_CAPSULE | Freq: Three times a day (TID) | ORAL | Status: DC

## 2015-05-05 NOTE — Progress Notes (Signed)
   Subjective:    Patient ID: Sharon Rowland, female    DOB: 04-21-1986, 29 y.o.   MRN: [id number]  Sinusitis This is a new problem. The current episode started 1 to 4 weeks ago. The problem is unchanged. The maximum temperature recorded prior to her arrival was 101 - 101.9 F. The pain is moderate. Associated symptoms include congestion, coughing, ear pain, headaches and a sore throat. (Wheezing, abdominal pain, runny nose) Past treatments include acetaminophen (ibuprofen, inhaler). The treatment provided no relief.   Others in family sick  Body aching and runny nose and fever  Dim energy  Some prod cough , dim   Using inhaler three or four times per d  vom with coughing  Missed tod and four d go, not ready to go bk yet     Review of Systems  HENT: Positive for congestion, ear pain and sore throat.   Respiratory: Positive for cough.   Neurological: Positive for headaches.       Objective:   Physical Exam Alert moderate malaise. Vitals stable. HEENT moderate nasal congestion pharynx normal lungs no wheezes some rhonchi no crackles no tachypnea       Assessment & Plan:  Impression post viral rhinosinusitis/bronchitis with exacerbation of asthma plan maintain albuterol. Amoxicillin 500 3 times a day 10 days. Symptomatic care discussed WSL

## 2015-06-20 ENCOUNTER — Encounter: Payer: Self-pay | Admitting: Orthopedic Surgery

## 2015-06-20 ENCOUNTER — Ambulatory Visit (INDEPENDENT_AMBULATORY_CARE_PROVIDER_SITE_OTHER): Payer: BLUE CROSS/BLUE SHIELD | Admitting: Orthopedic Surgery

## 2015-06-20 VITALS — BP 120/69 | Ht 64.0 in | Wt 155.0 lb

## 2015-06-20 DIAGNOSIS — M25512 Pain in left shoulder: Secondary | ICD-10-CM | POA: Diagnosis not present

## 2015-06-20 DIAGNOSIS — M25519 Pain in unspecified shoulder: Secondary | ICD-10-CM | POA: Insufficient documentation

## 2015-06-20 NOTE — Patient Instructions (Signed)
Follow up as needed.  Call us if you feel like you need another shot.

## 2015-06-20 NOTE — Progress Notes (Signed)
Patient ID: Sharon Rowland, female   DOB: [birth year], 29 y.o.   MRN: [id number] New    Chief Complaint  Patient presents with  . Shoulder Pain    Left shoulder pain, no injury.     Sharon Rowland is a 29 y.o. female.   HPI Sharon Rowland is 29 years old she presents with tingling in the left shoulder blade for 6 months unrelieved by ibuprofen. She reports a numb tingly feeling in the medial border of the left scapula which is constant and worse with activity but only rates her pain 1 out of 10.  Review of systems hayfever seasonal allergy skin rashes wheezing and heart palpitations from SVT controlled Review of Systems See hpi  Past Medical History  Diagnosis Date  . PSVT (paroxysmal supraventricular tachycardia)   . Allergic rhinitis   . Pelvic pain in antepartum period in second trimester 01/17/2014  . Asthma     last used inhaler 1 month ago  . Anemia   . GERD (gastroesophageal reflux disease)     Past Surgical History  Procedure Laterality Date  . Supraventricular tachycardia ablation  01/05/2015    unsuccessful  . Supraventricular tachycardia ablation N/A 01/05/2015    Procedure: SUPRAVENTRICULAR TACHYCARDIA ABLATION;  Surgeon: Marinus Maw, MD;  Location: [facility] CATH LAB;  Service: Cardiovascular;  Laterality: N/A;    Family History  Problem Relation Age of Onset  . Hypertension Mother   . Hyperlipidemia Mother   . Multiple sclerosis Mother   . COPD Mother   . Cancer Mother     melanoma x 2  . Diabetes Mother   . Heart disease Father     PSVT  . Hyperlipidemia Brother   . Hypertension Brother   . Heart disease Maternal Grandfather     heart attack  . Cancer Paternal Grandmother     pancreatic cancer  . Heart disease Paternal Grandmother     MI  . Hyperlipidemia Paternal Grandmother   . Hypertension Paternal Grandmother     Social History History  Substance Use Topics  . Smoking status: Never Smoker   . Smokeless tobacco: Never Used  . Alcohol Use: No     Allergies  Allergen Reactions  . Latex Dermatitis    Current Outpatient Prescriptions  Medication Sig Dispense Refill  . albuterol (PROVENTIL HFA;VENTOLIN HFA) 108 (90 BASE) MCG/ACT inhaler Inhale 2 puffs into the lungs every 6 (six) hours as needed for wheezing or shortness of breath.     Marland Kitchen ibuprofen (ADVIL,MOTRIN) 600 MG tablet Take 1 tablet (600 mg total) by mouth every 6 (six) hours as needed. (Patient taking differently: Take 600 mg by mouth every 6 (six) hours as needed (pain). ) 30 tablet 0  . amoxicillin (AMOXIL) 500 MG capsule Take 1 capsule (500 mg total) by mouth 3 (three) times daily. (Patient not taking: Reported on 06/20/2015) 30 capsule 0  . fluticasone (FLOVENT HFA) 110 MCG/ACT inhaler Inhale 1 puff into the lungs 2 (two) times daily. (Patient not taking: Reported on 06/20/2015) 1 Inhaler 5  . metoprolol (LOPRESSOR) 100 MG tablet Take 1 tablet (100 mg total) by mouth 2 (two) times daily. (Patient not taking: Reported on 06/20/2015) 60 tablet 11  . norethindrone (MICRONOR,CAMILA,ERRIN) 0.35 MG tablet Take 1 tablet (0.35 mg total) by mouth daily. (Patient not taking: Reported on 05/05/2015) 1 Package 11   No current facility-administered medications for this visit.       Physical Exam Blood pressure 120/69, height 5\' 4"  (1.626 m),  weight 155 lb (70.308 kg), currently breastfeeding. Physical Exam The patient is well developed well nourished and well groomed. Orientation to person place and time is normal  Mood is pleasant. Ambulatory status normal ambulatory function. She has full range of motion of the left shoulder with no instability or motor exam is normal there is no specific tenderness in the cervical spine or medial scapula. She says it feels good to have it massaged. The scans intact pulses are good the reflexes are normal the lymph nodes are negative   Data Reviewed No additional data reviewed  Assessment Encounter Diagnosis  Name Primary?  . Trigger point of  shoulder region, left Yes    Plan A steroid injection was performed at medial border of the left scapula using 1% plain Lidocaine and 40 mg of Depo-Medrol; This was well tolerated.  Recommend heating pad and salon pas patches

## 2015-07-25 ENCOUNTER — Encounter: Payer: Self-pay | Admitting: Family Medicine

## 2015-07-25 ENCOUNTER — Ambulatory Visit (INDEPENDENT_AMBULATORY_CARE_PROVIDER_SITE_OTHER): Payer: BLUE CROSS/BLUE SHIELD | Admitting: Family Medicine

## 2015-07-25 VITALS — BP 108/74 | Ht 64.0 in | Wt 157.0 lb

## 2015-07-25 DIAGNOSIS — R209 Unspecified disturbances of skin sensation: Secondary | ICD-10-CM

## 2015-07-25 DIAGNOSIS — IMO0001 Reserved for inherently not codable concepts without codable children: Secondary | ICD-10-CM

## 2015-07-25 NOTE — Progress Notes (Signed)
   Subjective:    Patient ID: Sharon Rowland, female    DOB: Feb 14, 1986, 29 y.o.   MRN: [id number]  Shoulder Injury  The incident occurred more than 1 week ago. There was no injury mechanism. The quality of the pain is described as burning (Pins and needle sensation). The pain radiates to the back. Associated symptoms include tingling. The symptoms are aggravated by movement. She has tried heat and NSAIDs (Steroid injection (Ortho)) for the symptoms. The treatment provided no relief.   Patient states that she has numbness and tingling in bilateral shoulders. Patient states that she saw ortho specialist and received a steroid injection with no relief. Patient states that when she is looking downward symptoms worsen. Patient states no other concerns this visit. Symptoms been going on for a proximally 6 months but worse over the past 6 weeks with increased pins and needle sensation that's uncomfortable denies any weakness, and time laboratories massage and stretching over the past few months have not helped Patient does have family history of MS Review of Systems  Neurological: Positive for tingling.   patient denies weakness. Relates pins and needle sensation in her back. Denies any injury.     Objective:   Physical Exam  Lungs are clear hearts regular neck no masses subjective pins and needle sensation in the rhomboid region on the left side and some on the right side there is no scapula muscle changes. Finger to nose normal Romberg negative cranial nerves negative reflexes good strength in arms and legs good      Assessment & Plan:  Persistent paresthesias and numbness for 6 months worsening over the past 6 weeks with pins and needle sensation in the mid back region this appears to be most likely an impingement of nerve I believe it would be wise for the patient to have a MRI if this is negative then referral to neurology for possible EMG nerve conduction studies I doubt MS but MRI what help  rule this out

## 2015-08-01 ENCOUNTER — Ambulatory Visit (INDEPENDENT_AMBULATORY_CARE_PROVIDER_SITE_OTHER): Payer: BLUE CROSS/BLUE SHIELD | Admitting: Adult Health

## 2015-08-01 ENCOUNTER — Encounter: Payer: Self-pay | Admitting: Adult Health

## 2015-08-01 VITALS — BP 110/68 | HR 64 | Ht 64.0 in | Wt 157.5 lb

## 2015-08-01 DIAGNOSIS — N926 Irregular menstruation, unspecified: Secondary | ICD-10-CM | POA: Diagnosis not present

## 2015-08-01 DIAGNOSIS — Z3201 Encounter for pregnancy test, result positive: Secondary | ICD-10-CM | POA: Diagnosis not present

## 2015-08-01 DIAGNOSIS — O3680X0 Pregnancy with inconclusive fetal viability, not applicable or unspecified: Secondary | ICD-10-CM

## 2015-08-01 DIAGNOSIS — Z3490 Encounter for supervision of normal pregnancy, unspecified, unspecified trimester: Secondary | ICD-10-CM

## 2015-08-01 HISTORY — DX: Encounter for supervision of normal pregnancy, unspecified, unspecified trimester: Z34.90

## 2015-08-01 LAB — POCT URINE PREGNANCY: Preg Test, Ur: POSITIVE — AB

## 2015-08-01 NOTE — Progress Notes (Signed)
Subjective:     Patient ID: Sharon Rowland, female   DOB: 11-Feb-1986, 29 y.o.   MRN: [id number]  HPI Sharon Rowland is a 29 year old white female married in for UPT has missed period.No complaints.  Review of Systems Patient denies any headaches, hearing loss, fatigue, blurred vision, shortness of breath, chest pain, abdominal pain, problems with bowel movements, urination, or intercourse. No joint pain or mood swings. Reviewed past medical,surgical, social and family history. Reviewed medications and allergies.     Objective:   Physical Exam BP 110/68 mmHg  Pulse 64  Ht 5\' 4"  (1.626 m)  Wt 157 lb 8 oz (71.442 kg)  BMI 27.02 kg/m2  LMP 05/24/2015 (Approximate)  Breastfeeding? YesUPT+, about 9+6 weeks by LMP but she is breast feeding and periods not regular, will get [place] in 10 days, Skin warm and dry. Lungs: clear to ausculation bilaterally. Cardiovascular: regular rate and rhythm.Discussed getting QHCG but will just get [place].    Assessment:     Pregnant     Plan:    Take PNV with folic acid Return in 10 days for dating [place] Review handout on first trimester

## 2015-08-01 NOTE — Patient Instructions (Signed)
First Trimester of Pregnancy The first trimester of pregnancy is from week 1 until the end of week 12 (months 1 through 3). A week after a sperm fertilizes an egg, the egg will implant on the wall of the uterus. This embryo will begin to develop into a baby. Genes from you and your partner are forming the baby. The female genes determine whether the baby is a boy or a girl. At 6-8 weeks, the eyes and face are formed, and the heartbeat can be seen on ultrasound. At the end of 12 weeks, all the baby's organs are formed.  Now that you are pregnant, you will want to do everything you can to have a healthy baby. Two of the most important things are to get good prenatal care and to follow your health care provider's instructions. Prenatal care is all the medical care you receive before the baby's birth. This care will help prevent, find, and treat any problems during the pregnancy and childbirth. BODY CHANGES Your body goes through many changes during pregnancy. The changes vary from woman to woman.   You may gain or lose a couple of pounds at first.  You may feel sick to your stomach (nauseous) and throw up (vomit). If the vomiting is uncontrollable, call your health care provider.  You may tire easily.  You may develop headaches that can be relieved by medicines approved by your health care provider.  You may urinate more often. Painful urination may mean you have a bladder infection.  You may develop heartburn as a result of your pregnancy.  You may develop constipation because certain hormones are causing the muscles that push waste through your intestines to slow down.  You may develop hemorrhoids or swollen, bulging veins (varicose veins).  Your breasts may begin to grow larger and become tender. Your nipples may stick out more, and the tissue that surrounds them (areola) may become darker.  Your gums may bleed and may be sensitive to brushing and flossing.  Dark spots or blotches (chloasma,  mask of pregnancy) may develop on your face. This will likely fade after the baby is born.  Your menstrual periods will stop.  You may have a loss of appetite.  You may develop cravings for certain kinds of food.  You may have changes in your emotions from day to day, such as being excited to be pregnant or being concerned that something may go wrong with the pregnancy and baby.  You may have more vivid and strange dreams.  You may have changes in your hair. These can include thickening of your hair, rapid growth, and changes in texture. Some women also have hair loss during or after pregnancy, or hair that feels dry or thin. Your hair will most likely return to normal after your baby is born. WHAT TO EXPECT AT YOUR PRENATAL VISITS During a routine prenatal visit:  You will be weighed to make sure you and the baby are growing normally.  Your blood pressure will be taken.  Your abdomen will be measured to track your baby's growth.  The fetal heartbeat will be listened to starting around week 10 or 12 of your pregnancy.  Test results from any previous visits will be discussed. Your health care provider may ask you:  How you are feeling.  If you are feeling the baby move.  If you have had any abnormal symptoms, such as leaking fluid, bleeding, severe headaches, or abdominal cramping.  If you have any questions. Other tests   that may be performed during your first trimester include:  Blood tests to find your blood type and to check for the presence of any previous infections. They will also be used to check for low iron levels (anemia) and Rh antibodies. Later in the pregnancy, blood tests for diabetes will be done along with other tests if problems develop.  Urine tests to check for infections, diabetes, or protein in the urine.  An ultrasound to confirm the proper growth and development of the baby.  An amniocentesis to check for possible genetic problems.  Fetal screens for  spina bifida and Down syndrome.  You may need other tests to make sure you and the baby are doing well. HOME CARE INSTRUCTIONS  Medicines  Follow your health care provider's instructions regarding medicine use. Specific medicines may be either safe or unsafe to take during pregnancy.  Take your prenatal vitamins as directed.  If you develop constipation, try taking a stool softener if your health care provider approves. Diet  Eat regular, well-balanced meals. Choose a variety of foods, such as meat or vegetable-based protein, fish, milk and low-fat dairy products, vegetables, fruits, and whole grain breads and cereals. Your health care provider will help you determine the amount of weight gain that is right for you.  Avoid raw meat and uncooked cheese. These carry germs that can cause birth defects in the baby.  Eating four or five small meals rather than three large meals a day may help relieve nausea and vomiting. If you start to feel nauseous, eating a few soda crackers can be helpful. Drinking liquids between meals instead of during meals also seems to help nausea and vomiting.  If you develop constipation, eat more high-fiber foods, such as fresh vegetables or fruit and whole grains. Drink enough fluids to keep your urine clear or pale yellow. Activity and Exercise  Exercise only as directed by your health care provider. Exercising will help you:  Control your weight.  Stay in shape.  Be prepared for labor and delivery.  Experiencing pain or cramping in the lower abdomen or low back is a good sign that you should stop exercising. Check with your health care provider before continuing normal exercises.  Try to avoid standing for long periods of time. Move your legs often if you must stand in one place for a long time.  Avoid heavy lifting.  Wear low-heeled shoes, and practice good posture.  You may continue to have sex unless your health care provider directs you  otherwise. Relief of Pain or Discomfort  Wear a good support bra for breast tenderness.   Take warm sitz baths to soothe any pain or discomfort caused by hemorrhoids. Use hemorrhoid cream if your health care provider approves.   Rest with your legs elevated if you have leg cramps or low back pain.  If you develop varicose veins in your legs, wear support hose. Elevate your feet for 15 minutes, 3-4 times a day. Limit salt in your diet. Prenatal Care  Schedule your prenatal visits by the twelfth week of pregnancy. They are usually scheduled monthly at first, then more often in the last 2 months before delivery.  Write down your questions. Take them to your prenatal visits.  Keep all your prenatal visits as directed by your health care provider. Safety  Wear your seat belt at all times when driving.  Make a list of emergency phone numbers, including numbers for family, friends, the hospital, and police and fire departments. General Tips    Ask your health care provider for a referral to a local prenatal education class. Begin classes no later than at the beginning of month 6 of your pregnancy.  Ask for help if you have counseling or nutritional needs during pregnancy. Your health care provider can offer advice or refer you to specialists for help with various needs.  Do not use hot tubs, steam rooms, or saunas.  Do not douche or use tampons or scented sanitary pads.  Do not cross your legs for long periods of time.  Avoid cat litter boxes and soil used by cats. These carry germs that can cause birth defects in the baby and possibly loss of the fetus by miscarriage or stillbirth.  Avoid all smoking, herbs, alcohol, and medicines not prescribed by your health care provider. Chemicals in these affect the formation and growth of the baby.  Schedule a dentist appointment. At home, brush your teeth with a soft toothbrush and be gentle when you floss. SEEK MEDICAL CARE IF:   You have  dizziness.  You have mild pelvic cramps, pelvic pressure, or nagging pain in the abdominal area.  You have persistent nausea, vomiting, or diarrhea.  You have a bad smelling vaginal discharge.  You have pain with urination.  You notice increased swelling in your face, hands, legs, or ankles. SEEK IMMEDIATE MEDICAL CARE IF:   You have a fever.  You are leaking fluid from your vagina.  You have spotting or bleeding from your vagina.  You have severe abdominal cramping or pain.  You have rapid weight gain or loss.  You vomit blood or material that looks like coffee grounds.  You are exposed to German measles and have never had them.  You are exposed to fifth disease or chickenpox.  You develop a severe headache.  You have shortness of breath.  You have any kind of trauma, such as from a fall or a car accident. Document Released: 12/03/2001 Document Revised: 04/25/2014 Document Reviewed: 10/19/2013 Exit[employer] Patient Information 2015 [employer]. This information is not intended to replace advice given to you by your health care provider. Make sure you discuss any questions you have with your health care provider. Return in 10 days for US  

## 2015-08-03 ENCOUNTER — Other Ambulatory Visit (HOSPITAL_COMMUNITY): Payer: Self-pay

## 2015-08-07 ENCOUNTER — Telehealth: Payer: Self-pay | Admitting: *Deleted

## 2015-08-07 NOTE — Telephone Encounter (Signed)
Left message x 1. JSY 

## 2015-08-08 ENCOUNTER — Ambulatory Visit (HOSPITAL_COMMUNITY): Payer: BLUE CROSS/BLUE SHIELD

## 2015-08-08 NOTE — Telephone Encounter (Signed)
Left message x 2. JSY 

## 2015-08-09 NOTE — Telephone Encounter (Signed)
Spoke with pt. Pt states she was upset that her MRI was cancelled. Pt states she has had a MRI before during pregnancy. Pt is having numbness in back and shoulders and don't feel like it should wait until after delivery. Pt wants you to reschedule MRI. Please advise. Thanks!! JSY

## 2015-08-10 ENCOUNTER — Ambulatory Visit (INDEPENDENT_AMBULATORY_CARE_PROVIDER_SITE_OTHER): Payer: BLUE CROSS/BLUE SHIELD

## 2015-08-10 ENCOUNTER — Other Ambulatory Visit: Payer: Self-pay | Admitting: Adult Health

## 2015-08-10 ENCOUNTER — Telehealth: Payer: Self-pay | Admitting: Adult Health

## 2015-08-10 DIAGNOSIS — O3680X0 Pregnancy with inconclusive fetal viability, not applicable or unspecified: Secondary | ICD-10-CM

## 2015-08-10 NOTE — Progress Notes (Signed)
[place] 5+6 wks GS w/ YS,no fetal pole seen,normal ov's bilat

## 2015-08-10 NOTE — Telephone Encounter (Signed)
Pt aware US showed 5+6 week GS and YS no fetal pole, she declines labs, will  Re check [place] in 1 week

## 2015-08-14 ENCOUNTER — Telehealth: Payer: Self-pay | Admitting: Family Medicine

## 2015-08-14 NOTE — Telephone Encounter (Signed)
Pt called stating that her dog had tape worms and was told by the vet that she needed to have her children tested for them. Please advise

## 2015-08-15 NOTE — Telephone Encounter (Signed)
Front please get children's names and send message on each child. Thank you

## 2015-08-15 NOTE — Telephone Encounter (Signed)
Need  Message on all the children so i can put in orders

## 2015-08-15 NOTE — Telephone Encounter (Signed)
I would only rec O and P times one for all children, test must be run through [facility] not vets

## 2015-08-15 NOTE — Telephone Encounter (Signed)
Toniann Fail please talk with mom, I did not generate this request. Find out the kids names and please do the orders, get the front to help if need be , Thank you

## 2015-08-16 ENCOUNTER — Other Ambulatory Visit: Payer: Self-pay | Admitting: Adult Health

## 2015-08-16 DIAGNOSIS — O3680X0 Pregnancy with inconclusive fetal viability, not applicable or unspecified: Secondary | ICD-10-CM

## 2015-08-16 NOTE — Telephone Encounter (Signed)
Mother does not want to be tested only wants test for her kids bc they play with the dog. Message sent to front to put in message on the children that she wants tested so we can order.

## 2015-08-18 ENCOUNTER — Other Ambulatory Visit: Payer: BLUE CROSS/BLUE SHIELD

## 2015-08-29 ENCOUNTER — Ambulatory Visit (INDEPENDENT_AMBULATORY_CARE_PROVIDER_SITE_OTHER): Payer: Managed Care, Other (non HMO)

## 2015-08-29 DIAGNOSIS — O3680X0 Pregnancy with inconclusive fetal viability, not applicable or unspecified: Secondary | ICD-10-CM

## 2015-08-29 NOTE — Progress Notes (Signed)
[place] 8+2wks,single IUP w/ys,pos fht 172bpm,crl 18.8mm,normal ov's bilat

## 2015-08-30 NOTE — Telephone Encounter (Signed)
Sharon Rowland,  I would have thought pt would have been seen for pregnancy by now so this could be addressed. If she is coming to [place], could this be addressed after update by exam.  I dont find info that she has been seen here since this came up.

## 2015-08-31 NOTE — Telephone Encounter (Signed)
Pt has an appt on 09/05/15. I put in sticky note to mention this at appt. JSY

## 2015-09-05 ENCOUNTER — Ambulatory Visit (INDEPENDENT_AMBULATORY_CARE_PROVIDER_SITE_OTHER): Payer: Managed Care, Other (non HMO) | Admitting: Advanced Practice Midwife

## 2015-09-05 ENCOUNTER — Encounter: Payer: Self-pay | Admitting: Advanced Practice Midwife

## 2015-09-05 VITALS — BP 108/60 | Wt 157.0 lb

## 2015-09-05 DIAGNOSIS — Z369 Encounter for antenatal screening, unspecified: Secondary | ICD-10-CM

## 2015-09-05 DIAGNOSIS — Z3491 Encounter for supervision of normal pregnancy, unspecified, first trimester: Secondary | ICD-10-CM

## 2015-09-05 DIAGNOSIS — Z3490 Encounter for supervision of normal pregnancy, unspecified, unspecified trimester: Secondary | ICD-10-CM | POA: Insufficient documentation

## 2015-09-05 DIAGNOSIS — Z0283 Encounter for blood-alcohol and blood-drug test: Secondary | ICD-10-CM

## 2015-09-05 DIAGNOSIS — Z1389 Encounter for screening for other disorder: Secondary | ICD-10-CM

## 2015-09-05 DIAGNOSIS — I471 Supraventricular tachycardia: Secondary | ICD-10-CM

## 2015-09-05 DIAGNOSIS — Z331 Pregnant state, incidental: Secondary | ICD-10-CM

## 2015-09-05 DIAGNOSIS — O360191 Maternal care for anti-D [Rh] antibodies, unspecified trimester, fetus 1: Secondary | ICD-10-CM

## 2015-09-05 NOTE — Progress Notes (Signed)
Subjective:    Sharon Rowland is a [id number] 25w2dbeing seen today for her first obstetrical visit.  Her obstetrical history is significant for nothing other than "being told not to have a baby bigger" than her 7#8oz baby.  Last baby delivered very easily at 7# 6 oz..  Pregnancy history fully reviewed Patient reports no complaints. She had been on Lopressor for SVT as recently as last pregnancy.  However, she had a CV study 2015, and only sinus tachycardia could be induced.  Cardiologist subsequently does not recommend any medications, and pt feels fine. [place], but also to R/O MS d/t family hx and some parasthesia for 6 months.  Due to a miscommunication in our office, the [facility] were advised to cancel it. However, upon reviewing the notes, a MRI is appropriate and certainly safe during pregnancy.   Pt would like to get it done now rather than after pregnancy for a variety of reasons, one being that her deductible will be met with the pregnancy.   Her 161year old is very fussy today, so pt wants to wait until next visit to get her pap and bloodwork.    Filed Vitals:   09/05/15 1345  BP: 108/60  Weight: 157 lb (71.215 kg)    HISTORY: OB History  Gravida Para Term Preterm AB SAB TAB Ectopic Multiple Living  _0 # Outcome Date GA Lbr Len/2nd Weight Sex Delivery Anes PTL Lv  5 Current           4 Term 06/18/14 413w2d7:20 / 06:30 7 lb 6.9 oz (3.37 kg) M Vag-Spont EPI  Y  3 Term 12/19/06 4020w0d lb 8 oz (3.402 kg) M Vag-Spont EPI  Y     Comments: forceps "Told not to have a baby bigger than this"  2 Term 10/02/05 41w65w0dlb 2 oz (3.232 kg) M Vag-Spont EPI  Y  1 Term 10/23/03 41w010w0db 3 oz (2.807 kg) F Vag-Spont EPI  Y     Comments: PROM with septicemia (hospitalized for 1 week)     Past Medical History  Diagnosis Date  . PSVT (paroxysmal supraventricular tachycardia)     could not be confirmed with 2016 study  .  Allergic rhinitis   . Pelvic pain in antepartum period in second trimester 01/17/2014  . Asthma     last used inhaler 1 month ago  . Anemia   . GERD (gastroesophageal reflux disease)   . Pregnant 08/01/2015   Past Surgical History  Procedure Laterality Date  . Supraventricular tachycardia ablation  01/05/2015    unsuccessful  . Supraventricular tachycardia ablation N/A 01/05/2015    Procedure: SUPRAVENTRICULAR TACHYCARDIA ABLATION;  Surgeon: GreggEvans Lance  [facility];  Service: Cardiovascular;  Laterality: N/A;   Family History  Problem Relation Age of Onset  . Hypertension Mother   . Hyperlipidemia Mother   . Multiple sclerosis Mother   . COPD Mother   . Cancer Mother     melanoma x 2  . Diabetes Mother   . Heart disease Father     PSVT  . Hyperlipidemia Brother   . Hypertension Brother   . Heart disease Maternal Grandfather     heart attack  . Cancer Paternal Grandmother     pancreatic cancer  . Heart disease Paternal Grandmother     MI  .  Hyperlipidemia Paternal Grandmother   . Hypertension Paternal Grandmother      Exam                                      System:     Skin: normal coloration and turgor, no rashes    Neurologic: oriented, normal, normal mood   Extremities: normal strength, tone, and muscle mass   HEENT PERRLA   Mouth/Teeth mucous membranes moist, normal dentition   Neck supple and no masses   Cardiovascular: regular rate and rhythm   Respiratory:  appears well, vitals normal, no respiratory distress, acyanotic   Abdomen: soft, non-tender;  FHR: 160 [place]          Assessment:    Pregnancy: [id number] Patient Active Problem List   Diagnosis Date Noted  . Supervision of normal pregnancy 09/05/2015  . Trigger point of shoulder region 06/20/2015  . Rh negative state in antepartum period 01/31/2014  . Abnormal Pap smear of cervix 01/31/2014  . Paroxysmal SVT (supraventricular tachycardia) 11/09/2013  . Asthma, stable  11/09/2013        Plan:     Initial labs--pt will come back tomorrow (fussy child) Continue prenatal vitamins  Problem list reviewed and updated  Dr. Wolfgang Phoenix sent a message via EPIC asking to please reorder MRI Reviewed n/v relief measures and warning s/s to report  Reviewed recommended weight gain based on pre-gravid BMI  Encouraged well-balanced diet Genetic Screening discussed Integrated Screen: declined.  Ultrasound discussed; fetal survey: requested.  Follow up in 4 weeks for LROB.  CRESENZO-DISHMAN,Jaydien Panepinto 09/05/2015

## 2015-09-05 NOTE — Addendum Note (Signed)
Addended by: Jacklyn Shell on: 09/05/2015 03:20 PM   Modules accepted: Orders

## 2015-09-22 LAB — HIV ANTIBODY (ROUTINE TESTING W REFLEX): HIV SCREEN 4TH GENERATION: NONREACTIVE

## 2015-09-22 LAB — CBC
HEMOGLOBIN: 12.9 g/dL (ref 11.1–15.9)
Hematocrit: 38.8 % (ref 34.0–46.6)
MCH: 27.6 pg (ref 26.6–33.0)
MCHC: 33.2 g/dL (ref 31.5–35.7)
MCV: 83 fL (ref 79–97)
Platelets: 220 10*3/uL (ref 150–379)
RBC: 4.67 x10E6/uL (ref 3.77–5.28)
RDW: 15.5 % — AB (ref 12.3–15.4)
WBC: 9.6 10*3/uL (ref 3.4–10.8)

## 2015-09-22 LAB — RUBELLA SCREEN: RUBELLA: 2.96 {index} (ref >0.99)

## 2015-09-22 LAB — ANTIBODY SCREEN: Antibody Screen: NEGATIVE

## 2015-09-22 LAB — ABO/RH: Rh Factor: NEGATIVE

## 2015-09-22 LAB — VARICELLA ZOSTER ANTIBODY, IGG: Varicella zoster IgG: 936 index (ref >165)

## 2015-09-22 LAB — UNABLE TO VOID

## 2015-09-22 LAB — RPR: RPR Ser Ql: NONREACTIVE

## 2015-09-26 ENCOUNTER — Encounter: Payer: Managed Care, Other (non HMO) | Admitting: Women's Health

## 2015-09-26 ENCOUNTER — Encounter: Payer: Managed Care, Other (non HMO) | Admitting: Advanced Practice Midwife

## 2015-10-03 ENCOUNTER — Encounter: Payer: Self-pay | Admitting: Women's Health

## 2015-10-03 ENCOUNTER — Ambulatory Visit (INDEPENDENT_AMBULATORY_CARE_PROVIDER_SITE_OTHER): Payer: Managed Care, Other (non HMO) | Admitting: Women's Health

## 2015-10-03 ENCOUNTER — Other Ambulatory Visit (HOSPITAL_COMMUNITY)
Admission: RE | Admit: 2015-10-03 | Discharge: 2015-10-03 | Disposition: A | Discharge status: Home / Self Care | Payer: Managed Care, Other (non HMO) | Source: Ambulatory Visit | Attending: Obstetrics & Gynecology | Admitting: Obstetrics & Gynecology

## 2015-10-03 VITALS — BP 104/72 | HR 84 | Wt 157.0 lb

## 2015-10-03 DIAGNOSIS — Z23 Encounter for immunization: Secondary | ICD-10-CM

## 2015-10-03 DIAGNOSIS — Z01411 Encounter for gynecological examination (general) (routine) with abnormal findings: Secondary | ICD-10-CM | POA: Diagnosis present

## 2015-10-03 DIAGNOSIS — Z331 Pregnant state, incidental: Secondary | ICD-10-CM

## 2015-10-03 DIAGNOSIS — Z1389 Encounter for screening for other disorder: Secondary | ICD-10-CM

## 2015-10-03 DIAGNOSIS — O360110 Maternal care for anti-D [Rh] antibodies, first trimester, not applicable or unspecified: Secondary | ICD-10-CM

## 2015-10-03 DIAGNOSIS — Z1151 Encounter for screening for human papillomavirus (HPV): Secondary | ICD-10-CM | POA: Insufficient documentation

## 2015-10-03 DIAGNOSIS — Z113 Encounter for screening for infections with a predominantly sexual mode of transmission: Secondary | ICD-10-CM | POA: Diagnosis present

## 2015-10-03 DIAGNOSIS — G43909 Migraine, unspecified, not intractable, without status migrainosus: Secondary | ICD-10-CM | POA: Insufficient documentation

## 2015-10-03 DIAGNOSIS — Z3491 Encounter for supervision of normal pregnancy, unspecified, first trimester: Secondary | ICD-10-CM

## 2015-10-03 DIAGNOSIS — Z369 Encounter for antenatal screening, unspecified: Secondary | ICD-10-CM

## 2015-10-03 LAB — POCT URINALYSIS DIPSTICK
Blood, UA: NEGATIVE
Blood, UA: NEGATIVE
GLUCOSE UA: NEGATIVE
Glucose, UA: NEGATIVE
Ketones, UA: NEGATIVE
Ketones, UA: NEGATIVE
LEUKOCYTES UA: NEGATIVE
Leukocytes, UA: NEGATIVE
NITRITE UA: NEGATIVE
NITRITE UA: NEGATIVE
Protein, UA: NEGATIVE
Protein, UA: NEGATIVE

## 2015-10-03 MED ORDER — BUTALBITAL-APAP-CAFFEINE 50-325-40 MG PO TABS: 1.0000 | ORAL_TABLET | ORAL | Status: DC | PRN

## 2015-10-03 NOTE — Progress Notes (Signed)
Pt states that she has had a migraine for a week, tylenol does not help.

## 2015-10-03 NOTE — Progress Notes (Signed)
Low-risk OB appointment [id number] [redacted]w[redacted]d Estimated Date of Delivery: 04/07/16 BP 104/72 mmHg  Pulse 84  Wt 157 lb (71.215 kg)  LMP 05/24/2015 (Approximate)  BP, weight, and urine reviewed.  Refer to obstetrical flow sheet for FH & FHR.  No fm yet. Denies cramping, lof, vb, or uti s/s. Migraine x 1 week, apap not helping. Used to be on lopressor for PSVT which helped w/ migraines, but weaned off previously for procedure, and SVT has been well controlled off meds. Has taken fioricet in past which helped. Rx fioricet.  Thin prep pap obtained Reviewed warning s/s to report. Plan:  Continue routine obstetrical care  F/U in 4wks for OB appointment  New ob urine studies sent today, Flu shot today

## 2015-10-03 NOTE — Patient Instructions (Signed)

## 2015-10-04 LAB — CYTOLOGY - PAP

## 2015-10-04 LAB — GC/CHLAMYDIA PROBE AMP
CHLAMYDIA, DNA PROBE: NEGATIVE
Neisseria gonorrhoeae by PCR: NEGATIVE

## 2015-10-05 ENCOUNTER — Encounter: Payer: Self-pay | Admitting: Advanced Practice Midwife

## 2015-10-05 ENCOUNTER — Other Ambulatory Visit: Payer: Self-pay | Admitting: Advanced Practice Midwife

## 2015-10-05 LAB — URINE CULTURE

## 2015-10-05 MED ORDER — SULFAMETHOXAZOLE-TRIMETHOPRIM 800-160 MG PO TABS: 1.0000 | ORAL_TABLET | Freq: Two times a day (BID) | ORAL | Status: DC

## 2015-10-05 NOTE — Progress Notes (Signed)
ASB. (not susceptible to macrobid). Rx septra DS

## 2015-10-31 ENCOUNTER — Ambulatory Visit (INDEPENDENT_AMBULATORY_CARE_PROVIDER_SITE_OTHER): Payer: Managed Care, Other (non HMO) | Admitting: Women's Health

## 2015-10-31 ENCOUNTER — Encounter: Payer: Self-pay | Admitting: Women's Health

## 2015-10-31 VITALS — BP 110/62 | HR 64 | Wt 160.0 lb

## 2015-10-31 DIAGNOSIS — Z1389 Encounter for screening for other disorder: Secondary | ICD-10-CM

## 2015-10-31 DIAGNOSIS — Z3492 Encounter for supervision of normal pregnancy, unspecified, second trimester: Secondary | ICD-10-CM

## 2015-10-31 DIAGNOSIS — Z331 Pregnant state, incidental: Secondary | ICD-10-CM

## 2015-10-31 DIAGNOSIS — Z363 Encounter for antenatal screening for malformations: Secondary | ICD-10-CM

## 2015-10-31 LAB — POCT URINALYSIS DIPSTICK
Blood, UA: NEGATIVE
Glucose, UA: NEGATIVE
Ketones, UA: NEGATIVE
LEUKOCYTES UA: NEGATIVE
NITRITE UA: NEGATIVE
Protein, UA: NEGATIVE

## 2015-10-31 NOTE — Progress Notes (Signed)
Low-risk OB appointment [id number] [redacted]w[redacted]d Estimated Date of Delivery: 04/07/16 BP 110/62 mmHg  Pulse 64  Wt 160 lb (72.576 kg)  LMP 05/24/2015 (Approximate)  BP, weight, and urine reviewed.  Refer to obstetrical flow sheet for FH & FHR.  Reports good fm.  Denies regular uc's, lof, vb, or uti s/s. Fioricet not helping w/ migraines, and makes her heart race. To stop fioricet, gave migraine prevention & acute relief cocktail info (mag oxide, coq10, vit B2 daily for prevention; benadryl, mag oxide, gatorage, 2 extra strength apap, and 1 cup coffee/coke for acute tx).  Has been having urge to have bm, but isn't able to when she sits down, the other day she did have bm and had lots of mucous and large amt frank red blood, bm's are soft and q 3d which is normal for her- discussed w/ L[facility], likely ruptured internal hemorrhoid and may have upper intestinal issue- so to try miralax daily to help get cleaned out Reviewed warning s/s to report. Plan:  Continue routine obstetrical care  F/U in 3wks for OB appointment and anatomy u/s

## 2015-10-31 NOTE — Patient Instructions (Signed)
Prevention of Headaches: CoQ 10  three times a day Vitamin B2  a day Magnesium Oxide 400-600mg  daily  When you have a headache: Benadryl  by mouth Magnesium Oxide 1 large gatorade 2 extra strength tylenol One cup coffee or coke  Miralax every day for few days to a week  Second Trimester of Pregnancy The second trimester is from week 13 through week 28, months 4 through 6. The second trimester is often a time when you feel your best. Your body has also adjusted to being pregnant, and you begin to feel better physically. Usually, morning sickness has lessened or quit completely, you may have more energy, and you may have an increase in appetite. The second trimester is also a time when the fetus is growing rapidly. At the end of the sixth month, the fetus is about 9 inches long and weighs about 1 pounds. You will likely begin to feel the baby move (quickening) between 18 and 20 weeks of the pregnancy. BODY CHANGES Your body goes through many changes during pregnancy. The changes vary from woman to woman.   Your weight will continue to increase. You will notice your lower abdomen bulging out.  You may begin to get stretch marks on your hips, abdomen, and breasts.  You may develop headaches that can be relieved by medicines approved by your health care provider.  You may urinate more often because the fetus is pressing on your bladder.  You may develop or continue to have heartburn as a result of your pregnancy.  You may develop constipation because certain hormones are causing the muscles that push waste through your intestines to slow down.  You may develop hemorrhoids or swollen, bulging veins (varicose veins).  You may have back pain because of the weight gain and pregnancy hormones relaxing your joints between the bones in your pelvis and as a result of a shift in weight and the muscles that support your balance.  Your breasts will continue to grow and be  tender.  Your gums may bleed and may be sensitive to brushing and flossing.  Dark spots or blotches (chloasma, mask of pregnancy) may develop on your face. This will likely fade after the baby is born.  A dark line from your belly button to the pubic area (linea nigra) may appear. This will likely fade after the baby is born.  You may have changes in your hair. These can include thickening of your hair, rapid growth, and changes in texture. Some women also have hair loss during or after pregnancy, or hair that feels dry or thin. Your hair will most likely return to normal after your baby is born. WHAT TO EXPECT AT YOUR PRENATAL VISITS During a routine prenatal visit:  You will be weighed to make sure you and the fetus are growing normally.  Your blood pressure will be taken.  Your abdomen will be measured to track your baby's growth.  The fetal heartbeat will be listened to.  Any test results from the previous visit will be discussed. Your health care provider may ask you:  How you are feeling.  If you are feeling the baby move.  If you have had any abnormal symptoms, such as leaking fluid, bleeding, severe headaches, or abdominal cramping.  If you are using any tobacco products, including cigarettes, chewing tobacco, and electronic cigarettes.  If you have any questions. Other tests that may be performed during your second trimester include:  Blood tests that check for:  Low iron  levels (anemia).  Gestational diabetes (between 24 and 28 weeks).  Rh antibodies.  Urine tests to check for infections, diabetes, or protein in the urine.  An ultrasound to confirm the proper growth and development of the baby.  An amniocentesis to check for possible genetic problems.  Fetal screens for spina bifida and Down syndrome.  HIV (human immunodeficiency virus) testing. Routine prenatal testing includes screening for HIV, unless you choose not to have this test. HOME CARE  INSTRUCTIONS   Avoid all smoking, herbs, alcohol, and unprescribed drugs. These chemicals affect the formation and growth of the baby.  Do not use any tobacco products, including cigarettes, chewing tobacco, and electronic cigarettes. If you need help quitting, ask your health care provider. You may receive counseling support and other resources to help you quit.  Follow your health care provider's instructions regarding medicine use. There are medicines that are either safe or unsafe to take during pregnancy.  Exercise only as directed by your health care provider. Experiencing uterine cramps is a good sign to stop exercising.  Continue to eat regular, healthy meals.  Wear a good support bra for breast tenderness.  Do not use hot tubs, steam rooms, or saunas.  Wear your seat belt at all times when driving.  Avoid raw meat, uncooked cheese, cat litter boxes, and soil used by cats. These carry germs that can cause birth defects in the baby.  Take your prenatal vitamins.  Take 1500-2000 mg of calcium daily starting at the 20th week of pregnancy until you deliver your baby.  Try taking a stool softener (if your health care provider approves) if you develop constipation. Eat more high-fiber foods, such as fresh vegetables or fruit and whole grains. Drink plenty of fluids to keep your urine clear or pale yellow.  Take warm sitz baths to soothe any pain or discomfort caused by hemorrhoids. Use hemorrhoid cream if your health care provider approves.  If you develop varicose veins, wear support hose. Elevate your feet for 15 minutes, 3-4 times a day. Limit salt in your diet.  Avoid heavy lifting, wear low heel shoes, and practice good posture.  Rest with your legs elevated if you have leg cramps or low back pain.  Visit your dentist if you have not gone yet during your pregnancy. Use a soft toothbrush to brush your teeth and be gentle when you floss.  A sexual relationship may be  continued unless your health care provider directs you otherwise.  Continue to go to all your prenatal visits as directed by your health care provider. SEEK MEDICAL CARE IF:   You have dizziness.  You have mild pelvic cramps, pelvic pressure, or nagging pain in the abdominal area.  You have persistent nausea, vomiting, or diarrhea.  You have a bad smelling vaginal discharge.  You have pain with urination. SEEK IMMEDIATE MEDICAL CARE IF:   You have a fever.  You are leaking fluid from your vagina.  You have spotting or bleeding from your vagina.  You have severe abdominal cramping or pain.  You have rapid weight gain or loss.  You have shortness of breath with chest pain.  You notice sudden or extreme swelling of your face, hands, ankles, feet, or legs.  You have not felt your baby move in over an hour.  You have severe headaches that do not go away with medicine.  You have vision changes.   This information is not intended to replace advice given to you by your health care  provider. Make sure you discuss any questions you have with your health care provider.   Document Released: 12/03/2001 Document Revised: 12/30/2014 Document Reviewed: 02/09/2013 Elsevier Interactive Patient Education [employer]

## 2015-11-21 ENCOUNTER — Ambulatory Visit (INDEPENDENT_AMBULATORY_CARE_PROVIDER_SITE_OTHER): Payer: Managed Care, Other (non HMO) | Admitting: Women's Health

## 2015-11-21 ENCOUNTER — Ambulatory Visit (INDEPENDENT_AMBULATORY_CARE_PROVIDER_SITE_OTHER): Payer: Managed Care, Other (non HMO)

## 2015-11-21 ENCOUNTER — Encounter: Payer: Self-pay | Admitting: Women's Health

## 2015-11-21 VITALS — BP 104/62 | HR 64 | Wt 164.0 lb

## 2015-11-21 DIAGNOSIS — Z363 Encounter for antenatal screening for malformations: Secondary | ICD-10-CM

## 2015-11-21 DIAGNOSIS — Z36 Encounter for antenatal screening of mother: Secondary | ICD-10-CM | POA: Diagnosis not present

## 2015-11-21 DIAGNOSIS — Z331 Pregnant state, incidental: Secondary | ICD-10-CM

## 2015-11-21 DIAGNOSIS — O2342 Unspecified infection of urinary tract in pregnancy, second trimester: Secondary | ICD-10-CM

## 2015-11-21 DIAGNOSIS — Z3492 Encounter for supervision of normal pregnancy, unspecified, second trimester: Secondary | ICD-10-CM

## 2015-11-21 DIAGNOSIS — Z1389 Encounter for screening for other disorder: Secondary | ICD-10-CM

## 2015-11-21 LAB — POCT URINALYSIS DIPSTICK
Blood, UA: NEGATIVE
Glucose, UA: NEGATIVE
Ketones, UA: NEGATIVE
Leukocytes, UA: NEGATIVE
Nitrite, UA: POSITIVE
Protein, UA: NEGATIVE

## 2015-11-21 MED ORDER — NITROFURANTOIN MONOHYD MACRO 100 MG PO CAPS: 100.0000 mg | ORAL_CAPSULE | Freq: Two times a day (BID) | ORAL | Status: DC

## 2015-11-21 MED ORDER — BUTALBITAL-APAP-CAFFEINE 50-325-40 MG PO TABS: 1.0000 | ORAL_TABLET | ORAL | Status: DC | PRN

## 2015-11-21 NOTE — Progress Notes (Signed)
[place] 20+2wks,ant pl gr 0,normal ov's bilat,cephalic,svp of fluid 5cm,fhr 811 bpm,efw 380g,anatomy complete no obvious abn seen

## 2015-11-21 NOTE — Patient Instructions (Signed)

## 2015-11-21 NOTE — Progress Notes (Signed)
Low-risk OB appointment [id number] [redacted]w[redacted]d Estimated Date of Delivery: 04/07/16 BP 104/62 mmHg  Pulse 64  Wt 164 lb (74.39 kg)  LMP 05/24/2015 (Approximate)  BP, weight, and urine reviewed.  Refer to obstetrical flow sheet for FH & FHR.  Reports good fm.  Denies regular uc's, lof, vb. Headaches are getting better, requests refill on fioricet to have if needed- given. Thinks she has uti, has been taking azo/cranberry. +nitrites- rx macrobid, urine cx sent Reviewed today's normal anatomy u/s, ptl s/s, fm. Plan:  Continue routine obstetrical care  F/U in 4wks for OB appointment

## 2015-11-23 LAB — URINE CULTURE

## 2015-12-19 ENCOUNTER — Encounter: Payer: Managed Care, Other (non HMO) | Admitting: Advanced Practice Midwife

## 2015-12-24 NOTE — L&D Delivery Note (Signed)
Delivery Note Patient presented in spontaneous labor and progressed w/o augmentation. Fetal tachycardia shortly prior to delivery, no other signs/symptoms triple I, no abx given.  At 10:20 AM a viable female was delivered via  (Presentation: OA  ).  APGAR: 9/9 ; weight 4451 g.   Placenta status: intact  Cord: 3 vessel with the following complications: none .  Cord pH: not obtained  Anesthesia: Epidural  Episiotomy:  none Lacerations:  none Est. Blood Loss (mL):  100  Mom to postpartum.  Baby to Couplet care / Skin to Skin.  Cherrie Gauze Noemy Hallmon 04/10/2016, 10:33 AM

## 2016-01-25 ENCOUNTER — Encounter: Payer: Managed Care, Other (non HMO) | Admitting: Obstetrics & Gynecology

## 2016-01-25 ENCOUNTER — Encounter: Payer: Managed Care, Other (non HMO) | Admitting: Advanced Practice Midwife

## 2016-01-25 ENCOUNTER — Telehealth: Payer: Self-pay | Admitting: Advanced Practice Midwife

## 2016-01-25 NOTE — Telephone Encounter (Signed)
Pt c/o intense itching all over, no rash x 1 week. Pt states the itchy has gotten worse past couple of days. Pt given an appt today for evaluation.

## 2016-01-29 ENCOUNTER — Encounter: Payer: Managed Care, Other (non HMO) | Admitting: Obstetrics & Gynecology

## 2016-01-30 ENCOUNTER — Ambulatory Visit (INDEPENDENT_AMBULATORY_CARE_PROVIDER_SITE_OTHER): Payer: Managed Care, Other (non HMO) | Admitting: Women's Health

## 2016-01-30 ENCOUNTER — Encounter: Payer: Self-pay | Admitting: Women's Health

## 2016-01-30 VITALS — BP 110/60 | HR 64 | Wt 177.0 lb

## 2016-01-30 DIAGNOSIS — Z331 Pregnant state, incidental: Secondary | ICD-10-CM

## 2016-01-30 DIAGNOSIS — Z1389 Encounter for screening for other disorder: Secondary | ICD-10-CM

## 2016-01-30 DIAGNOSIS — L299 Pruritus, unspecified: Secondary | ICD-10-CM

## 2016-01-30 DIAGNOSIS — O0930 Supervision of pregnancy with insufficient antenatal care, unspecified trimester: Secondary | ICD-10-CM

## 2016-01-30 DIAGNOSIS — Z3493 Encounter for supervision of normal pregnancy, unspecified, third trimester: Secondary | ICD-10-CM

## 2016-01-30 DIAGNOSIS — Z369 Encounter for antenatal screening, unspecified: Secondary | ICD-10-CM

## 2016-01-30 LAB — POCT URINALYSIS DIPSTICK
Glucose, UA: NEGATIVE
Ketones, UA: NEGATIVE
Nitrite, UA: NEGATIVE
PROTEIN UA: NEGATIVE
RBC UA: NEGATIVE

## 2016-01-30 NOTE — Progress Notes (Signed)
Work-in Low-risk OB appointment [id number] [redacted]w[redacted]d Estimated Date of Delivery: 04/07/16 BP 110/60 mmHg  Pulse 64  Wt 177 lb (80.287 kg)  LMP 05/24/2015 (Approximate)  BP, weight, and urine reviewed.  Refer to obstetrical flow sheet for FH & FHR.  Reports good fm.  Denies regular uc's, lof, vb, or uti s/s. Generalized itching x 1 1/2wks, worse at night, +on soles of feet, none on palms of hands. Took benadryl which helped her sleep but didn't help w/ itching. Just ate before she came. No care in 10wks- states she forgot one appt, had a flat tire on way to another, then didn't rescheduled. Didn't realize it had been so long since last visit. Hasn't had pn2. Declines gtt- states she always declines it. Understands inherent risks of undiagnosed GDM.  Reviewed ptl s/s, fkc. Hydrocortisone/benadryl creams, cool showers, cool wash clots Plan:  Will check cholestasis labs. Continue routine obstetrical care  F/U in am for fasting bile acids, cmp, pn2 (minus gtt), then 2wks for OB appointment and rhogam

## 2016-01-30 NOTE — Patient Instructions (Addendum)
Nothing to eat or drink after midnight tonight Come to [facility] in the morning for your labs, they open at 8am  Call the office [phone]) or go to [facility] if:  You begin to have strong, frequent contractions  Your water breaks.  Sometimes it is a big gush of fluid, sometimes it is just a trickle that keeps getting your panties wet or running down your legs  You have vaginal bleeding.  It is normal to have a small amount of spotting if your cervix was checked.   You don't feel your baby moving like normal.  If you don't, get you something to eat and drink and lay down and focus on feeling your baby move.  You should feel at least 10 movements in 2 hours.  If you don't, you should call the office or go to [facility].    Hydrocortisone or benadryl cream, cool shower, cool wash cloths  Preterm Labor Information Preterm labor is when labor starts at less than 37 weeks of pregnancy. The normal length of a pregnancy is 39 to 41 weeks. CAUSES Often, there is no identifiable underlying cause as to why a woman goes into preterm labor. One of the most common known causes of preterm labor is infection. Infections of the uterus, cervix, vagina, amniotic sac, bladder, kidney, or even the lungs (pneumonia) can cause labor to start. Other suspected causes of preterm labor include:   Urogenital infections, such as yeast infections and bacterial vaginosis.   Uterine abnormalities (uterine shape, uterine septum, fibroids, or bleeding from the placenta).   A cervix that has been operated on (it may fail to stay closed).   Malformations in the fetus.   Multiple gestations (twins, triplets, and so on).   Breakage of the amniotic sac.  RISK FACTORS  Having a previous history of preterm labor.   Having premature rupture of membranes (PROM).   Having a placenta that covers the opening of the cervix (placenta previa).   Having a placenta that separates from the uterus (placental  abruption).   Having a cervix that is too weak to hold the fetus in the uterus (incompetent cervix).   Having too much fluid in the amniotic sac (polyhydramnios).   Taking illegal drugs or smoking while pregnant.   Not gaining enough weight while pregnant.   Being younger than 97 and older than 30 years old.   Having a low socioeconomic status.   Being African American. SYMPTOMS Signs and symptoms of preterm labor include:   Menstrual-like cramps, abdominal pain, or back pain.  Uterine contractions that are regular, as frequent as six in an hour, regardless of their intensity (may be mild or painful).  Contractions that start on the top of the uterus and spread down to the lower abdomen and back.   A sense of increased pelvic pressure.   A watery or bloody mucus discharge that comes from the vagina.  TREATMENT Depending on the length of the pregnancy and other circumstances, your health care provider may suggest bed rest. If necessary, there are medicines that can be given to stop contractions and to mature the fetal lungs. If labor happens before 34 weeks of pregnancy, a prolonged hospital stay may be recommended. Treatment depends on the condition of both you and the fetus.  WHAT SHOULD YOU DO IF YOU THINK YOU ARE IN PRETERM LABOR? Call your health care provider right away. You will need to go to the hospital to get checked immediately. HOW CAN YOU PREVENT  PRETERM LABOR IN FUTURE PREGNANCIES? You should:   Stop smoking if you smoke.  Maintain healthy weight gain and avoid chemicals and drugs that are not necessary.  Be watchful for any type of infection.  Inform your health care provider if you have a known history of preterm labor.   This information is not intended to replace advice given to you by your health care provider. Make sure you discuss any questions you have with your health care provider.   Document Released: 02/29/2004 Document Revised:  08/11/2013 Document Reviewed: 01/11/2013 Elsevier Interactive Patient Education [employer]

## 2016-02-01 LAB — COMPREHENSIVE METABOLIC PANEL
ALT: 8 IU/L (ref 0–32)
AST: 5 IU/L (ref 0–40)
Albumin/Globulin Ratio: 1.3 (ref 1.1–2.5)
Albumin: 3.3 g/dL — ABNORMAL LOW (ref 3.5–5.5)
Alkaline Phosphatase: 75 IU/L (ref 39–117)
BILIRUBIN TOTAL: 0.2 mg/dL (ref 0.0–1.2)
BUN/Creatinine Ratio: 5 — ABNORMAL LOW (ref 8–20)
BUN: 3 mg/dL — AB (ref 6–20)
CALCIUM: 9.1 mg/dL (ref 8.7–10.2)
CHLORIDE: 99 mmol/L (ref 96–106)
CO2: 21 mmol/L (ref 18–29)
Creatinine, Ser: 0.57 mg/dL (ref 0.57–1.00)
GFR, EST AFRICAN AMERICAN: 144 mL/min/{1.73_m2} (ref >59)
GFR, EST NON AFRICAN AMERICAN: 125 mL/min/{1.73_m2} (ref >59)
GLUCOSE: 81 mg/dL (ref 65–99)
Globulin, Total: 2.6 g/dL (ref 1.5–4.5)
Potassium: 3.8 mmol/L (ref 3.5–5.2)
Sodium: 137 mmol/L (ref 134–144)
TOTAL PROTEIN: 5.9 g/dL — AB (ref 6.0–8.5)

## 2016-02-01 LAB — BILE ACIDS, TOTAL: BILE ACIDS TOTAL: 3.9 umol/L — AB (ref 4.7–24.5)

## 2016-02-01 LAB — CBC
Hematocrit: 30.8 % — ABNORMAL LOW (ref 34.0–46.6)
Hemoglobin: 10.1 g/dL — ABNORMAL LOW (ref 11.1–15.9)
MCH: 26.2 pg — ABNORMAL LOW (ref 26.6–33.0)
MCHC: 32.8 g/dL (ref 31.5–35.7)
MCV: 80 fL (ref 79–97)
PLATELETS: 218 10*3/uL (ref 150–379)
RBC: 3.86 x10E6/uL (ref 3.77–5.28)
RDW: 14 % (ref 12.3–15.4)
WBC: 10.8 10*3/uL (ref 3.4–10.8)

## 2016-02-01 LAB — HIV ANTIBODY (ROUTINE TESTING W REFLEX): HIV SCREEN 4TH GENERATION: NONREACTIVE

## 2016-02-01 LAB — ANTIBODY SCREEN: ANTIBODY SCREEN: NEGATIVE

## 2016-02-01 LAB — RPR: RPR Ser Ql: NONREACTIVE

## 2016-02-05 ENCOUNTER — Telehealth: Payer: Self-pay | Admitting: Adult Health

## 2016-02-05 NOTE — Telephone Encounter (Signed)
Pt aware of labs, take prenatal vitamins daily and eat greens, ? UTI mild symptoms,will get culture

## 2016-02-13 ENCOUNTER — Encounter: Payer: Self-pay | Admitting: Advanced Practice Midwife

## 2016-02-13 ENCOUNTER — Ambulatory Visit (INDEPENDENT_AMBULATORY_CARE_PROVIDER_SITE_OTHER): Payer: Managed Care, Other (non HMO) | Admitting: Advanced Practice Midwife

## 2016-02-13 VITALS — BP 110/72 | HR 96 | Wt 179.0 lb

## 2016-02-13 DIAGNOSIS — Z331 Pregnant state, incidental: Secondary | ICD-10-CM

## 2016-02-13 DIAGNOSIS — N390 Urinary tract infection, site not specified: Secondary | ICD-10-CM

## 2016-02-13 DIAGNOSIS — Z1389 Encounter for screening for other disorder: Secondary | ICD-10-CM

## 2016-02-13 DIAGNOSIS — Z3493 Encounter for supervision of normal pregnancy, unspecified, third trimester: Secondary | ICD-10-CM

## 2016-02-13 LAB — POCT URINALYSIS DIPSTICK
Glucose, UA: NEGATIVE
KETONES UA: NEGATIVE
Leukocytes, UA: NEGATIVE
Nitrite, UA: NEGATIVE
Protein, UA: NEGATIVE
RBC UA: NEGATIVE

## 2016-02-13 NOTE — Progress Notes (Signed)
[id number] [redacted]w[redacted]d Estimated Date of Delivery: 04/07/16  Blood pressure 110/72, pulse 96, weight 179 lb (81.194 kg), last menstrual period 05/24/2015, currently breastfeeding.   BP weight and urine results all reviewed and noted.  Please refer to the obstetrical flow sheet for the fundal height and fetal heart rate documentation:  Patient reports good fetal movement, denies any bleeding and no rupture of membranes symptoms or regular contractions. Patient is without complaints. All questions were answered.  Orders Placed This Encounter  Procedures  . Urine culture  . POCT urinalysis dipstick    Plan:  Continued routine obstetrical care,    Return in about 2 weeks (around 02/27/2016) for LROB.

## 2016-02-13 NOTE — Progress Notes (Signed)
Pt denies any problems or concerns at this time.  

## 2016-02-15 ENCOUNTER — Other Ambulatory Visit: Payer: Self-pay | Admitting: Advanced Practice Midwife

## 2016-02-15 MED ORDER — NITROFURANTOIN MONOHYD MACRO 100 MG PO CAPS
100.0000 mg | ORAL_CAPSULE | Freq: Two times a day (BID) | ORAL | Status: DC
Start: 2016-02-15 — End: 2016-03-11

## 2016-02-15 NOTE — Progress Notes (Signed)
+   gram neg rods growing .  Macrobid called in

## 2016-02-16 LAB — URINE CULTURE

## 2016-02-16 NOTE — Progress Notes (Signed)
LMOM that Rx had been called in for her and to call us back if there were any questions.

## 2016-02-27 ENCOUNTER — Ambulatory Visit (INDEPENDENT_AMBULATORY_CARE_PROVIDER_SITE_OTHER): Payer: Managed Care, Other (non HMO) | Admitting: Women's Health

## 2016-02-27 ENCOUNTER — Encounter: Payer: Self-pay | Admitting: Women's Health

## 2016-02-27 VITALS — BP 110/70 | HR 76 | Wt 182.0 lb

## 2016-02-27 DIAGNOSIS — Z331 Pregnant state, incidental: Secondary | ICD-10-CM

## 2016-02-27 DIAGNOSIS — O360130 Maternal care for anti-D [Rh] antibodies, third trimester, not applicable or unspecified: Secondary | ICD-10-CM

## 2016-02-27 DIAGNOSIS — O360131 Maternal care for anti-D [Rh] antibodies, third trimester, fetus 1: Secondary | ICD-10-CM | POA: Diagnosis not present

## 2016-02-27 DIAGNOSIS — Z3A33 33 weeks gestation of pregnancy: Secondary | ICD-10-CM

## 2016-02-27 DIAGNOSIS — O2343 Unspecified infection of urinary tract in pregnancy, third trimester: Secondary | ICD-10-CM

## 2016-02-27 DIAGNOSIS — Z3483 Encounter for supervision of other normal pregnancy, third trimester: Secondary | ICD-10-CM

## 2016-02-27 DIAGNOSIS — Z1389 Encounter for screening for other disorder: Secondary | ICD-10-CM

## 2016-02-27 DIAGNOSIS — Z87440 Personal history of urinary (tract) infections: Secondary | ICD-10-CM | POA: Insufficient documentation

## 2016-02-27 DIAGNOSIS — Z3493 Encounter for supervision of normal pregnancy, unspecified, third trimester: Secondary | ICD-10-CM

## 2016-02-27 LAB — POCT URINALYSIS DIPSTICK
Blood, UA: NEGATIVE
GLUCOSE UA: NEGATIVE
Ketones, UA: NEGATIVE
Leukocytes, UA: NEGATIVE
Nitrite, UA: NEGATIVE
PROTEIN UA: NEGATIVE

## 2016-02-27 MED ORDER — RHO D IMMUNE GLOBULIN 1500 UNIT/2ML IJ SOSY
300.0000 ug | PREFILLED_SYRINGE | Freq: Once | INTRAMUSCULAR | Status: AC
  Administered 2016-02-27: 300 ug via INTRAMUSCULAR

## 2016-02-27 MED ORDER — CEPHALEXIN 500 MG PO CAPS: 500.0000 mg | ORAL_CAPSULE | Freq: Four times a day (QID) | ORAL | Status: DC

## 2016-02-27 NOTE — Patient Instructions (Signed)
Call the office ([phone]) or go to [facility] if:  You begin to have strong, frequent contractions  Your water breaks.  Sometimes it is a big gush of fluid, sometimes it is just a trickle that keeps getting your panties wet or running down your legs  You have vaginal bleeding.  It is normal to have a small amount of spotting if your cervix was checked.   You don't feel your baby moving like normal.  If you don't, get you something to eat and drink and lay down and focus on feeling your baby move.  You should feel at least 10 movements in 2 hours.  If you don't, you should call the office or go to [facility].    Preterm Labor Information Preterm labor is when labor starts at less than 37 weeks of pregnancy. The normal length of a pregnancy is 39 to 41 weeks. CAUSES Often, there is no identifiable underlying cause as to why a woman goes into preterm labor. One of the most common known causes of preterm labor is infection. Infections of the uterus, cervix, vagina, amniotic sac, bladder, kidney, or even the lungs (pneumonia) can cause labor to start. Other suspected causes of preterm labor include:   Urogenital infections, such as yeast infections and bacterial vaginosis.   Uterine abnormalities (uterine shape, uterine septum, fibroids, or bleeding from the placenta).   A cervix that has been operated on (it may fail to stay closed).   Malformations in the fetus.   Multiple gestations (twins, triplets, and so on).   Breakage of the amniotic sac.  RISK FACTORS  Having a previous history of preterm labor.   Having premature rupture of membranes (PROM).   Having a placenta that covers the opening of the cervix (placenta previa).   Having a placenta that separates from the uterus (placental abruption).   Having a cervix that is too weak to hold the fetus in the uterus (incompetent cervix).   Having too much fluid in the amniotic sac (polyhydramnios).   Taking  illegal drugs or smoking while pregnant.   Not gaining enough weight while pregnant.   Being younger than 18 and older than 30 years old.   Having a low socioeconomic status.   Being African American. SYMPTOMS Signs and symptoms of preterm labor include:   Menstrual-like cramps, abdominal pain, or back pain.  Uterine contractions that are regular, as frequent as six in an hour, regardless of their intensity (may be mild or painful).  Contractions that start on the top of the uterus and spread down to the lower abdomen and back.   A sense of increased pelvic pressure.   A watery or bloody mucus discharge that comes from the vagina.  TREATMENT Depending on the length of the pregnancy and other circumstances, your health care provider may suggest bed rest. If necessary, there are medicines that can be given to stop contractions and to mature the fetal lungs. If labor happens before 34 weeks of pregnancy, a prolonged hospital stay may be recommended. Treatment depends on the condition of both you and the fetus.  WHAT SHOULD YOU DO IF YOU THINK YOU ARE IN PRETERM LABOR? Call your health care provider right away. You will need to go to the hospital to get checked immediately. HOW CAN YOU PREVENT PRETERM LABOR IN FUTURE PREGNANCIES? You should:   Stop smoking if you smoke.  Maintain healthy weight gain and avoid chemicals and drugs that are not necessary.  Be watchful for   any type of infection.  Inform your health care provider if you have a known history of preterm labor.   This information is not intended to replace advice given to you by your health care provider. Make sure you discuss any questions you have with your health care provider.   Document Released: 02/29/2004 Document Revised: 08/11/2013 Document Reviewed: 01/11/2013 Elsevier Interactive Patient Education 2016 Elsevier Inc.  

## 2016-02-27 NOTE — Progress Notes (Signed)
Low-risk OB appointment [id number] [redacted]w[redacted]d Estimated Date of Delivery: 04/07/16 BP 110/70 mmHg  Pulse 76  Wt 182 lb (82.555 kg)  LMP 05/24/2015 (Approximate)  BP, weight, and urine reviewed.  Refer to obstetrical flow sheet for FH & FHR.  Reports good fm.  Denies regular uc's, lof, vb, or uti s/s. States never got macrobid rx- pharmacy states they never got it. Has yet to have clean urine cx entire pregnancy, last urine cx on 2/21 showed Klebsiella, sensitive to Keflex- rx keflex qid x 7d then qhs thereafter for suppression. Wants BTL in hospital, then ablation pp to manage heavy periods.  Reviewed ptl s/s, fkc. Plan:  Continue routine obstetrical care  F/U in 2wks for OB appointment

## 2016-03-11 ENCOUNTER — Encounter: Payer: Self-pay | Admitting: Obstetrics & Gynecology

## 2016-03-11 ENCOUNTER — Ambulatory Visit (INDEPENDENT_AMBULATORY_CARE_PROVIDER_SITE_OTHER): Payer: Managed Care, Other (non HMO) | Admitting: Obstetrics & Gynecology

## 2016-03-11 VITALS — BP 120/70 | HR 76 | Wt 187.0 lb

## 2016-03-11 DIAGNOSIS — I471 Supraventricular tachycardia: Secondary | ICD-10-CM

## 2016-03-11 DIAGNOSIS — Z1389 Encounter for screening for other disorder: Secondary | ICD-10-CM

## 2016-03-11 DIAGNOSIS — Z3493 Encounter for supervision of normal pregnancy, unspecified, third trimester: Secondary | ICD-10-CM

## 2016-03-11 DIAGNOSIS — Z331 Pregnant state, incidental: Secondary | ICD-10-CM

## 2016-03-11 LAB — POCT URINALYSIS DIPSTICK
Blood, UA: NEGATIVE
Ketones, UA: NEGATIVE
LEUKOCYTES UA: NEGATIVE
NITRITE UA: NEGATIVE
Protein, UA: NEGATIVE

## 2016-03-11 MED ORDER — METOPROLOL SUCCINATE ER 25 MG PO TB24: 25.0000 mg | ORAL_TABLET | Freq: Every day | ORAL | Status: DC

## 2016-03-11 NOTE — Progress Notes (Signed)
[id number] [redacted]w[redacted]d Estimated Date of Delivery: 04/07/16  Blood pressure 120/70, pulse 76, weight 187 lb (84.823 kg), last menstrual period 05/24/2015, currently breastfeeding.   BP weight and urine results all reviewed and noted.  Please refer to the obstetrical flow sheet for the fundal height and fetal heart rate documentation:  Patient reports good fetal movement, denies any bleeding and no rupture of membranes symptoms or regular contractions. Patient is without complaints. All questions were answered.  Orders Placed This Encounter  Procedures  . POCT urinalysis dipstick    Plan:  Continued routine obstetrical care, begin metoprolol 25 XL today to control rate, has been not much of a problem Has decided to delay BTL to interval and will discuss endo ablation  No Follow-up on file.

## 2016-03-12 ENCOUNTER — Encounter: Payer: Managed Care, Other (non HMO) | Admitting: Advanced Practice Midwife

## 2016-03-18 ENCOUNTER — Ambulatory Visit (INDEPENDENT_AMBULATORY_CARE_PROVIDER_SITE_OTHER): Payer: Managed Care, Other (non HMO) | Admitting: Obstetrics & Gynecology

## 2016-03-18 ENCOUNTER — Encounter: Payer: Self-pay | Admitting: Obstetrics & Gynecology

## 2016-03-18 VITALS — BP 118/70 | HR 84 | Wt 191.5 lb

## 2016-03-18 DIAGNOSIS — Z3A37 37 weeks gestation of pregnancy: Secondary | ICD-10-CM

## 2016-03-18 DIAGNOSIS — Z369 Encounter for antenatal screening, unspecified: Secondary | ICD-10-CM

## 2016-03-18 DIAGNOSIS — Z3493 Encounter for supervision of normal pregnancy, unspecified, third trimester: Secondary | ICD-10-CM

## 2016-03-18 DIAGNOSIS — Z1389 Encounter for screening for other disorder: Secondary | ICD-10-CM

## 2016-03-18 DIAGNOSIS — Z331 Pregnant state, incidental: Secondary | ICD-10-CM

## 2016-03-18 LAB — POCT URINALYSIS DIPSTICK
Glucose, UA: NEGATIVE
KETONES UA: NEGATIVE
NITRITE UA: NEGATIVE
PROTEIN UA: NEGATIVE
RBC UA: NEGATIVE

## 2016-03-18 MED ORDER — METOPROLOL SUCCINATE ER 50 MG PO TB24: 50.0000 mg | ORAL_TABLET | Freq: Every day | ORAL | Status: DC

## 2016-03-18 NOTE — Progress Notes (Signed)
[id number] [redacted]w[redacted]d Estimated Date of Delivery: 04/07/16  Blood pressure 118/70, pulse 84, weight 191 lb 8 oz (86.864 kg), last menstrual period 05/24/2015, currently breastfeeding.   BP weight and urine results all reviewed and noted.  Please refer to the obstetrical flow sheet for the fundal height and fetal heart rate documentation:  Patient reports good fetal movement, denies any bleeding and no rupture of membranes symptoms or regular contractions. Patient is without complaints. All questions were answered.  Orders Placed This Encounter  Procedures  . GC/Chlamydia Probe Amp  . Strep Gp B NAA  . POCT Urinalysis Dipstick    Plan:  Continued routine obstetrical care, increase metoprolo xl to 50  Return in about 1 week (around 03/25/2016) for LROB.

## 2016-03-20 LAB — GC/CHLAMYDIA PROBE AMP
Chlamydia trachomatis, NAA: NEGATIVE
NEISSERIA GONORRHOEAE BY PCR: NEGATIVE

## 2016-03-20 LAB — STREP GP B NAA: STREP GROUP B AG: POSITIVE — AB

## 2016-03-26 ENCOUNTER — Ambulatory Visit (INDEPENDENT_AMBULATORY_CARE_PROVIDER_SITE_OTHER): Payer: Managed Care, Other (non HMO) | Admitting: Women's Health

## 2016-03-26 ENCOUNTER — Encounter: Payer: Self-pay | Admitting: Women's Health

## 2016-03-26 VITALS — BP 108/64 | HR 64 | Wt 192.0 lb

## 2016-03-26 DIAGNOSIS — Z331 Pregnant state, incidental: Secondary | ICD-10-CM

## 2016-03-26 DIAGNOSIS — Z3493 Encounter for supervision of normal pregnancy, unspecified, third trimester: Secondary | ICD-10-CM

## 2016-03-26 DIAGNOSIS — Z1389 Encounter for screening for other disorder: Secondary | ICD-10-CM

## 2016-03-26 DIAGNOSIS — I471 Supraventricular tachycardia: Secondary | ICD-10-CM

## 2016-03-26 LAB — POCT URINALYSIS DIPSTICK
Blood, UA: NEGATIVE
GLUCOSE UA: NEGATIVE
Ketones, UA: NEGATIVE
LEUKOCYTES UA: NEGATIVE
NITRITE UA: NEGATIVE
PROTEIN UA: NEGATIVE

## 2016-03-26 NOTE — Patient Instructions (Signed)
Call the office ([phone]) or go to [facility] if:  You begin to have strong, frequent contractions  Your water breaks.  Sometimes it is a big gush of fluid, sometimes it is just a trickle that keeps getting your panties wet or running down your legs  You have vaginal bleeding.  It is normal to have a small amount of spotting if your cervix was checked.   You don't feel your baby moving like normal.  If you don't, get you something to eat and drink and lay down and focus on feeling your baby move.  You should feel at least 10 movements in 2 hours.  If you don't, you should call the office or go to [facility].    Braxton Hicks Contractions Contractions of the uterus can occur throughout pregnancy. Contractions are not always a sign that you are in labor.  WHAT ARE BRAXTON HICKS CONTRACTIONS?  Contractions that occur before labor are called Braxton Hicks contractions, or false labor. Toward the end of pregnancy (32-34 weeks), these contractions can develop more often and may become more forceful. This is not true labor because these contractions do not result in opening (dilatation) and thinning of the cervix. They are sometimes difficult to tell apart from true labor because these contractions can be forceful and people have different pain tolerances. You should not feel embarrassed if you go to the hospital with false labor. Sometimes, the only way to tell if you are in true labor is for your health care provider to look for changes in the cervix. If there are no prenatal problems or other health problems associated with the pregnancy, it is completely safe to be sent home with false labor and await the onset of true labor. HOW CAN YOU TELL THE DIFFERENCE BETWEEN TRUE AND FALSE LABOR? False Labor  The contractions of false labor are usually shorter and not as hard as those of true labor.   The contractions are usually irregular.   The contractions are often felt in the front of  the lower abdomen and in the groin.   The contractions may go away when you walk around or change positions while lying down.   The contractions get weaker and are shorter lasting as time goes on.   The contractions do not usually become progressively stronger, regular, and closer together as with true labor.  True Labor  Contractions in true labor last 30-70 seconds, become very regular, usually become more intense, and increase in frequency.   The contractions do not go away with walking.   The discomfort is usually felt in the top of the uterus and spreads to the lower abdomen and low back.   True labor can be determined by your health care provider with an exam. This will show that the cervix is dilating and getting thinner.  WHAT TO REMEMBER  Keep up with your usual exercises and follow other instructions given by your health care provider.   Take medicines as directed by your health care provider.   Keep your regular prenatal appointments.   Eat and drink lightly if you think you are going into labor.   If Braxton Hicks contractions are making you uncomfortable:   Change your position from lying down or resting to walking, or from walking to resting.   Sit and rest in a tub of warm water.   Drink 2-3 glasses of water. Dehydration may cause these contractions.   Do slow and deep breathing several times an hour.    WHEN SHOULD I SEEK IMMEDIATE MEDICAL CARE? Seek immediate medical care if:  Your contractions become stronger, more regular, and closer together.   You have fluid leaking or gushing from your vagina.   You have a fever.   You pass blood-tinged mucus.   You have vaginal bleeding.   You have continuous abdominal pain.   You have low back pain that you never had before.   You feel your baby's head pushing down and causing pelvic pressure.   Your baby is not moving as much as it used to.    This information is not intended to  replace advice given to you by your health care provider. Make sure you discuss any questions you have with your health care provider.   Document Released: 12/09/2005 Document Revised: 12/14/2013 Document Reviewed: 09/20/2013 Elsevier Interactive Patient Education 2016 Elsevier Inc.  

## 2016-03-26 NOTE — Progress Notes (Signed)
Low-risk OB appointment [id number] [redacted]w[redacted]d Estimated Date of Delivery: 04/07/16 BP 108/64 mmHg  Pulse 64  Wt 192 lb (87.091 kg)  LMP 05/24/2015 (Approximate)  BP, weight, and urine reviewed.  Refer to obstetrical flow sheet for FH & FHR.  Reports good fm.  Denies regular uc's, lof, vb, or uti s/s. Still having ~4-5 episodes of SVT daily, toprol was increased to 50 last week. Discussed w/ LHE, leave at 50 for now. Fell twice yesterday d/t pelvic dysfunction- once walking up steps, then getting off toilet. Discussed pregnancy support belt, changing positions slowly, having cane/prop/something to stabilize to prevent falls.  Reviewed labor s/s, fkc, gbs+. Plan:  Continue routine obstetrical care  F/U in 1wk for OB appointment

## 2016-04-02 ENCOUNTER — Encounter: Payer: Managed Care, Other (non HMO) | Admitting: Obstetrics & Gynecology

## 2016-04-02 ENCOUNTER — Telehealth: Payer: Self-pay | Admitting: Women's Health

## 2016-04-02 NOTE — Telephone Encounter (Signed)
Pt states she has an appt today with Dr. Despina Hidden unable to come, is having contractions and does not want to drive to herself to appt, +FM, no gush of fluids. Pt informed if contractions 5-10 minutes apart, gush of fluids, needs to go to [facility]. Offered to r/s the appt for one day this week, pt stated would have to call back after discussing with her husband.

## 2016-04-08 ENCOUNTER — Encounter: Payer: Self-pay | Admitting: Obstetrics & Gynecology

## 2016-04-08 ENCOUNTER — Telehealth (HOSPITAL_COMMUNITY): Payer: Self-pay | Admitting: *Deleted

## 2016-04-08 ENCOUNTER — Encounter (HOSPITAL_COMMUNITY): Payer: Self-pay | Admitting: *Deleted

## 2016-04-08 ENCOUNTER — Ambulatory Visit (INDEPENDENT_AMBULATORY_CARE_PROVIDER_SITE_OTHER): Payer: Managed Care, Other (non HMO) | Admitting: Obstetrics & Gynecology

## 2016-04-08 VITALS — BP 120/70 | HR 74 | Wt 197.0 lb

## 2016-04-08 DIAGNOSIS — Z331 Pregnant state, incidental: Secondary | ICD-10-CM

## 2016-04-08 DIAGNOSIS — Z3483 Encounter for supervision of other normal pregnancy, third trimester: Secondary | ICD-10-CM | POA: Diagnosis not present

## 2016-04-08 DIAGNOSIS — Z3A41 41 weeks gestation of pregnancy: Secondary | ICD-10-CM

## 2016-04-08 DIAGNOSIS — Z1389 Encounter for screening for other disorder: Secondary | ICD-10-CM

## 2016-04-08 DIAGNOSIS — Z3493 Encounter for supervision of normal pregnancy, unspecified, third trimester: Secondary | ICD-10-CM

## 2016-04-08 DIAGNOSIS — I471 Supraventricular tachycardia: Secondary | ICD-10-CM

## 2016-04-08 LAB — POCT URINALYSIS DIPSTICK
Blood, UA: NEGATIVE
Glucose, UA: NEGATIVE
KETONES UA: NEGATIVE
LEUKOCYTES UA: NEGATIVE
Nitrite, UA: NEGATIVE
PROTEIN UA: NEGATIVE

## 2016-04-08 NOTE — Telephone Encounter (Signed)
Preadmission screen  

## 2016-04-08 NOTE — Progress Notes (Signed)
[id number] [redacted]w[redacted]d Estimated Date of Delivery: 04/07/16  Blood pressure 120/70, pulse 74, weight 197 lb (89.359 kg), last menstrual period 05/24/2015, currently breastfeeding.   BP weight and urine results all reviewed and noted.  Please refer to the obstetrical flow sheet for the fundal height and fetal heart rate documentation:  Patient reports good fetal movement, denies any bleeding and no rupture of membranes symptoms or regular contractions. Patient is without complaints. All questions were answered.  Orders Placed This Encounter  Procedures  . POCT urinalysis dipstick    Plan:  Continued routine obstetrical care, scheduled for post dates induction 04/14/2016  No Follow-up on file.

## 2016-04-10 ENCOUNTER — Inpatient Hospital Stay (HOSPITAL_COMMUNITY): Payer: Managed Care, Other (non HMO) | Admitting: Anesthesiology

## 2016-04-10 ENCOUNTER — Inpatient Hospital Stay (HOSPITAL_COMMUNITY)
Admission: AD | Admit: 2016-04-10 | Discharge: 2016-04-11 | DRG: 774 | Disposition: A | Discharge status: Home / Self Care | Payer: Managed Care, Other (non HMO) | Source: Ambulatory Visit | Attending: Family Medicine | Admitting: Family Medicine

## 2016-04-10 ENCOUNTER — Encounter (HOSPITAL_COMMUNITY): Payer: Self-pay

## 2016-04-10 DIAGNOSIS — Z6791 Unspecified blood type, Rh negative: Secondary | ICD-10-CM | POA: Diagnosis not present

## 2016-04-10 DIAGNOSIS — Z833 Family history of diabetes mellitus: Secondary | ICD-10-CM

## 2016-04-10 DIAGNOSIS — IMO0001 Reserved for inherently not codable concepts without codable children: Secondary | ICD-10-CM | POA: Insufficient documentation

## 2016-04-10 DIAGNOSIS — O9962 Diseases of the digestive system complicating childbirth: Secondary | ICD-10-CM | POA: Diagnosis present

## 2016-04-10 DIAGNOSIS — Z820 Family history of epilepsy and other diseases of the nervous system: Secondary | ICD-10-CM

## 2016-04-10 DIAGNOSIS — K219 Gastro-esophageal reflux disease without esophagitis: Secondary | ICD-10-CM | POA: Diagnosis present

## 2016-04-10 DIAGNOSIS — Z825 Family history of asthma and other chronic lower respiratory diseases: Secondary | ICD-10-CM

## 2016-04-10 DIAGNOSIS — Z8249 Family history of ischemic heart disease and other diseases of the circulatory system: Secondary | ICD-10-CM | POA: Diagnosis not present

## 2016-04-10 DIAGNOSIS — Z3A40 40 weeks gestation of pregnancy: Secondary | ICD-10-CM | POA: Diagnosis not present

## 2016-04-10 DIAGNOSIS — O26899 Other specified pregnancy related conditions, unspecified trimester: Secondary | ICD-10-CM

## 2016-04-10 DIAGNOSIS — O26893 Other specified pregnancy related conditions, third trimester: Secondary | ICD-10-CM | POA: Diagnosis present

## 2016-04-10 DIAGNOSIS — O9952 Diseases of the respiratory system complicating childbirth: Secondary | ICD-10-CM | POA: Diagnosis present

## 2016-04-10 DIAGNOSIS — I471 Supraventricular tachycardia, unspecified: Secondary | ICD-10-CM | POA: Diagnosis present

## 2016-04-10 DIAGNOSIS — R87619 Unspecified abnormal cytological findings in specimens from cervix uteri: Secondary | ICD-10-CM | POA: Diagnosis present

## 2016-04-10 DIAGNOSIS — Z87440 Personal history of urinary (tract) infections: Secondary | ICD-10-CM | POA: Diagnosis not present

## 2016-04-10 DIAGNOSIS — O99824 Streptococcus B carrier state complicating childbirth: Secondary | ICD-10-CM | POA: Diagnosis present

## 2016-04-10 DIAGNOSIS — Z808 Family history of malignant neoplasm of other organs or systems: Secondary | ICD-10-CM

## 2016-04-10 DIAGNOSIS — Z3490 Encounter for supervision of normal pregnancy, unspecified, unspecified trimester: Secondary | ICD-10-CM

## 2016-04-10 DIAGNOSIS — J45909 Unspecified asthma, uncomplicated: Secondary | ICD-10-CM | POA: Diagnosis present

## 2016-04-10 DIAGNOSIS — Z800 Family history of malignant neoplasm of digestive organs: Secondary | ICD-10-CM | POA: Diagnosis not present

## 2016-04-10 DIAGNOSIS — O0930 Supervision of pregnancy with insufficient antenatal care, unspecified trimester: Secondary | ICD-10-CM

## 2016-04-10 DIAGNOSIS — O9942 Diseases of the circulatory system complicating childbirth: Secondary | ICD-10-CM | POA: Diagnosis present

## 2016-04-10 DIAGNOSIS — G43909 Migraine, unspecified, not intractable, without status migrainosus: Secondary | ICD-10-CM | POA: Diagnosis present

## 2016-04-10 LAB — CBC
HCT: 32.3 % — ABNORMAL LOW (ref 36.0–46.0)
Hemoglobin: 10.3 g/dL — ABNORMAL LOW (ref 12.0–15.0)
MCH: 23.7 pg — AB (ref 26.0–34.0)
MCHC: 31.9 g/dL (ref 30.0–36.0)
MCV: 74.4 fL — AB (ref 78.0–100.0)
PLATELETS: 259 10*3/uL (ref 150–400)
RBC: 4.34 MIL/uL (ref 3.87–5.11)
RDW: 17 % — ABNORMAL HIGH (ref 11.5–15.5)
WBC: 12.9 10*3/uL — ABNORMAL HIGH (ref 4.0–10.5)

## 2016-04-10 MED ORDER — ONDANSETRON HCL 4 MG/2ML IJ SOLN: 4.0000 mg | INTRAMUSCULAR | Status: DC | PRN

## 2016-04-10 MED ORDER — TETANUS-DIPHTH-ACELL PERTUSSIS 5-2.5-18.5 LF-MCG/0.5 IM SUSP
0.5000 mL | Freq: Once | INTRAMUSCULAR | Status: AC
  Administered 2016-04-11: 0.5 mL via INTRAMUSCULAR

## 2016-04-10 MED ORDER — SODIUM CHLORIDE 0.9 % IV SOLN
2.0000 g | Freq: Four times a day (QID) | INTRAVENOUS | Status: DC
  Administered 2016-04-10 (×2): 2 g via INTRAVENOUS
  Filled 2016-04-10 (×3): qty 2000

## 2016-04-10 MED ORDER — EPHEDRINE 5 MG/ML INJ
10.0000 mg | INTRAVENOUS | Status: DC | PRN
  Filled 2016-04-10: qty 2

## 2016-04-10 MED ORDER — ACETAMINOPHEN 325 MG PO TABS: 650.0000 mg | ORAL_TABLET | ORAL | Status: DC | PRN

## 2016-04-10 MED ORDER — PRENATAL MULTIVITAMIN CH
1.0000 | ORAL_TABLET | Freq: Every day | ORAL | Status: DC
  Administered 2016-04-11: 1 via ORAL
  Filled 2016-04-10: qty 1

## 2016-04-10 MED ORDER — LACTATED RINGERS IV SOLN
500.0000 mL | INTRAVENOUS | Status: DC | PRN
  Administered 2016-04-10: 500 mL via INTRAVENOUS

## 2016-04-10 MED ORDER — ONDANSETRON HCL 4 MG PO TABS: 4.0000 mg | ORAL_TABLET | ORAL | Status: DC | PRN

## 2016-04-10 MED ORDER — SODIUM BICARBONATE 8.4 % IV SOLN
INTRAVENOUS | Status: DC | PRN
  Administered 2016-04-10: 5 mL via EPIDURAL

## 2016-04-10 MED ORDER — BENZOCAINE-MENTHOL 20-0.5 % EX AERO
1.0000 "application " | INHALATION_SPRAY | CUTANEOUS | Status: DC | PRN
  Filled 2016-04-10: qty 56

## 2016-04-10 MED ORDER — OXYTOCIN BOLUS FROM INFUSION: 500.0000 mL | INTRAVENOUS | Status: DC

## 2016-04-10 MED ORDER — LACTATED RINGERS IV SOLN: 500.0000 mL | Freq: Once | INTRAVENOUS | Status: DC

## 2016-04-10 MED ORDER — METOPROLOL SUCCINATE ER 50 MG PO TB24
50.0000 mg | ORAL_TABLET | Freq: Every day | ORAL | Status: DC
  Administered 2016-04-10 – 2016-04-11 (×2): 50 mg via ORAL
  Filled 2016-04-10 (×3): qty 1

## 2016-04-10 MED ORDER — PNEUMOCOCCAL VAC POLYVALENT 25 MCG/0.5ML IJ INJ
0.5000 mL | INJECTION | INTRAMUSCULAR | Status: AC
  Administered 2016-04-11: 0.5 mL via INTRAMUSCULAR
  Filled 2016-04-10: qty 0.5

## 2016-04-10 MED ORDER — SENNOSIDES-DOCUSATE SODIUM 8.6-50 MG PO TABS
2.0000 | ORAL_TABLET | ORAL | Status: DC
  Administered 2016-04-10: 2 via ORAL
  Filled 2016-04-10: qty 2

## 2016-04-10 MED ORDER — IBUPROFEN 600 MG PO TABS
600.0000 mg | ORAL_TABLET | Freq: Four times a day (QID) | ORAL | Status: DC
  Administered 2016-04-10 – 2016-04-11 (×5): 600 mg via ORAL
  Filled 2016-04-10 (×6): qty 1

## 2016-04-10 MED ORDER — COCONUT OIL OIL: 1.0000 "application " | TOPICAL_OIL | Status: DC | PRN

## 2016-04-10 MED ORDER — LIDOCAINE HCL (PF) 1 % IJ SOLN
INTRAMUSCULAR | Status: DC | PRN
  Administered 2016-04-10 (×2): 5 mL

## 2016-04-10 MED ORDER — FENTANYL 2.5 MCG/ML BUPIVACAINE 1/10 % EPIDURAL INFUSION (WH - ANES)
14.0000 mL/h | INTRAMUSCULAR | Status: DC | PRN
  Administered 2016-04-10 (×2): 14 mL/h via EPIDURAL
  Filled 2016-04-10 (×2): qty 125

## 2016-04-10 MED ORDER — LACTATED RINGERS IV SOLN
INTRAVENOUS | Status: DC
  Administered 2016-04-10: 02:00:00 via INTRAVENOUS

## 2016-04-10 MED ORDER — SIMETHICONE 80 MG PO CHEW: 80.0000 mg | CHEWABLE_TABLET | ORAL | Status: DC | PRN

## 2016-04-10 MED ORDER — WITCH HAZEL-GLYCERIN EX PADS: 1.0000 "application " | MEDICATED_PAD | CUTANEOUS | Status: DC | PRN

## 2016-04-10 MED ORDER — RHO D IMMUNE GLOBULIN 1500 UNIT/2ML IJ SOSY
300.0000 ug | PREFILLED_SYRINGE | Freq: Once | INTRAMUSCULAR | Status: AC
  Administered 2016-04-10: 300 ug via INTRAVENOUS
  Filled 2016-04-10: qty 2

## 2016-04-10 MED ORDER — PHENYLEPHRINE 40 MCG/ML (10ML) SYRINGE FOR IV PUSH (FOR BLOOD PRESSURE SUPPORT)
80.0000 ug | PREFILLED_SYRINGE | INTRAVENOUS | Status: DC | PRN
  Filled 2016-04-10: qty 20
  Filled 2016-04-10: qty 2

## 2016-04-10 MED ORDER — LIDOCAINE HCL (PF) 1 % IJ SOLN
INTRAMUSCULAR | Status: AC
  Filled 2016-04-10: qty 30

## 2016-04-10 MED ORDER — OXYTOCIN 10 UNIT/ML IJ SOLN
2.5000 [IU]/h | INTRAVENOUS | Status: DC
  Filled 2016-04-10: qty 4

## 2016-04-10 MED ORDER — DIBUCAINE 1 % RE OINT: 1.0000 "application " | TOPICAL_OINTMENT | RECTAL | Status: DC | PRN

## 2016-04-10 MED ORDER — ALBUTEROL SULFATE (2.5 MG/3ML) 0.083% IN NEBU: 3.0000 mL | INHALATION_SOLUTION | Freq: Four times a day (QID) | RESPIRATORY_TRACT | Status: DC | PRN

## 2016-04-10 MED ORDER — ZOLPIDEM TARTRATE 5 MG PO TABS: 5.0000 mg | ORAL_TABLET | Freq: Every evening | ORAL | Status: DC | PRN

## 2016-04-10 MED ORDER — PHENYLEPHRINE 40 MCG/ML (10ML) SYRINGE FOR IV PUSH (FOR BLOOD PRESSURE SUPPORT)
80.0000 ug | PREFILLED_SYRINGE | INTRAVENOUS | Status: DC | PRN
  Filled 2016-04-10: qty 2

## 2016-04-10 MED ORDER — DIPHENHYDRAMINE HCL 50 MG/ML IJ SOLN: 12.5000 mg | INTRAMUSCULAR | Status: DC | PRN

## 2016-04-10 MED ORDER — DIPHENHYDRAMINE HCL 25 MG PO CAPS: 25.0000 mg | ORAL_CAPSULE | Freq: Four times a day (QID) | ORAL | Status: DC | PRN

## 2016-04-10 NOTE — Progress Notes (Signed)
Delivery of live viable female by Dr Rockie Neighbours. APGARS 9,9

## 2016-04-10 NOTE — H&P (Signed)
LABOR AND DELIVERY ADMISSION HISTORY AND PHYSICAL NOTE  Sharon Rowland is a 30 y.o. female [phone] with IUP at [redacted]w[redacted]d by 9 week [place] presenting for contractions for the last 24 hours.   She reports positive fetal movement. She denies leakage of fluid or vaginal bleeding.  Prenatal History/Complications: Paraoxysmal SVT (on Troprol-XL).  Past Medical History: Past Medical History  Diagnosis Date  . PSVT (paroxysmal supraventricular tachycardia) (HCC)     could not be confirmed with 2016 study  . Allergic rhinitis   . Pelvic pain in antepartum period in second trimester 01/17/2014  . Asthma     last used inhaler 1 month ago  . Anemia   . GERD (gastroesophageal reflux disease)   . Pregnant 08/01/2015    Past Surgical History: Past Surgical History  Procedure Laterality Date  . Supraventricular tachycardia ablation  01/05/2015    unsuccessful  . Supraventricular tachycardia ablation N/A 01/05/2015    Procedure: SUPRAVENTRICULAR TACHYCARDIA ABLATION;  Surgeon: Marinus Maw, MD;  Location: [facility] CATH LAB;  Service: Cardiovascular;  Laterality: N/A;    Obstetrical History: OB History    Gravida Para Term Preterm AB TAB SAB Ectopic Multiple Living   Social History: Social History   Social History  . Marital Status: Married    Spouse Name: N/A  . Number of Children: N/A  . Years of Education: N/A   Social History Main Topics  . Smoking status: Never Smoker   . Smokeless tobacco: Never Used  . Alcohol Use: No  . Drug Use: No  . Sexual Activity: Yes    Birth Control/ Protection: None   Other Topics Concern  . None   Social History Narrative    Family History: Family History  Problem Relation Age of Onset  . Hypertension Mother   . Hyperlipidemia Mother   . Multiple sclerosis Mother   . COPD Mother   . Cancer Mother     melanoma x 2  . Diabetes Mother   . Heart disease Father     PSVT  . Hyperlipidemia Brother   . Hypertension Brother   .  Heart disease Maternal Grandfather     heart attack  . Cancer Paternal Grandmother     pancreatic cancer  . Heart disease Paternal Grandmother     MI  . Hyperlipidemia Paternal Grandmother   . Hypertension Paternal Grandmother     Allergies: Allergies  Allergen Reactions  . Latex Dermatitis    Prescriptions prior to admission  Medication Sig Dispense Refill Last Dose  . acetaminophen (TYLENOL) 325 MG tablet Take 650 mg by mouth every 6 (six) hours as needed. Reported on 01/30/2016   Taking  . albuterol (PROVENTIL HFA;VENTOLIN HFA) 108 (90 BASE) MCG/ACT inhaler Inhale 2 puffs into the lungs every 6 (six) hours as needed for wheezing or shortness of breath. Reported on 04/08/2016   Not Taking  . calcium carbonate (TUMS - DOSED IN MG ELEMENTAL CALCIUM) 500 MG chewable tablet Chew 1 tablet by mouth daily.   Taking  . cephALEXin (KEFLEX) 500 MG capsule Take 1 capsule (500 mg total) by mouth 4 (four) times daily. X 7 days, then qhs thereafter 50 capsule 0 Taking  . fluticasone (FLOVENT HFA) 110 MCG/ACT inhaler Inhale 1 puff into the lungs 2 (two) times daily. (Patient not taking: Reported on 04/08/2016) 1 Inhaler 5 Not Taking  . metoprolol succinate (TOPROL-XL) 50 MG 24 hr tablet Take  1 tablet (50 mg total) by mouth daily. 30 tablet 11 Taking  . Prenatal Vit-Fe Fumarate-FA (MULTIVITAMIN-PRENATAL) 27-0.8 MG TABS tablet Take 1 tablet by mouth daily at 12 noon.   Taking     Review of Systems   All systems reviewed and negative except as stated in HPI  Blood pressure 120/68, pulse 90, temperature 98.2 F (36.8 C), resp. rate 18, height 5\' 3"  (1.6 m), weight 197 lb (89.359 kg), last menstrual period 05/24/2015, currently breastfeeding. General appearance: alert and mild distress Lungs: clear to auscultation bilaterally Heart: regular rate and rhythm Abdomen: soft, non-tender; bowel sounds normal Extremities: No calf swelling or tenderness Presentation: cephalic Fetal monitoring: 145bpm,  moderate variability, accelerations present, decelerations absent. Uterine activity: Irregular Dilation: 6 Effacement (%): 70, 80 Exam by:: Lysle Dingwall RN    Prenatal labs: ABO, Rh: A/Negative/-- (09/29 1631) Antibody: Negative (02/08 0850) Rubella: !Error! Immune RPR: Non Reactive (02/08 0850)  HBsAg:   Negative in 2014 HIV: Non Reactive (02/08 0850)  GBS: Positive (03/27 1030)  1 hr Glucola: Declined Genetic screening: Declined Anatomy US: Normal female  Prenatal Transfer Tool  Maternal Diabetes: No- Pt declined testing Genetic Screening: Declined Maternal Ultrasounds/Referrals: Normal Fetal Ultrasounds or other Referrals:  None Maternal Substance Abuse:  No Significant Maternal Medications:  None Significant Maternal Lab Results: Lab values include: Group B Strep positive, Rh negative  Results for orders placed or performed during the hospital encounter of 04/10/16 (from the past 24 hour(s))  CBC   Collection Time: 04/10/16  1:26 AM  Result Value Ref Range   WBC 12.9 (H) 4.0 - 10.5 K/uL   RBC 4.34 3.87 - 5.11 MIL/uL   Hemoglobin 10.3 (L) 12.0 - 15.0 g/dL   HCT 36.1 (L) 44.3 - 15.4 %   MCV 74.4 (L) 78.0 - 100.0 fL   MCH 23.7 (L) 26.0 - 34.0 pg   MCHC 31.9 30.0 - 36.0 g/dL   RDW 00.8 (H) 67.6 - 19.5 %   Platelets 259 150 - 400 K/uL    Patient Active Problem List   Diagnosis Date Noted  . Active labor 04/10/2016  . Recurrent UTI (urinary tract infection) complicating pregnancy 02/27/2016  . Limited prenatal care, antepartum 01/30/2016  . Migraines 10/03/2015  . Supervision of normal pregnancy 09/05/2015  . Trigger point of shoulder region 06/20/2015  . Rh negative state in antepartum period 01/31/2014  . Abnormal Pap smear of cervix 01/31/2014  . Paroxysmal SVT (supraventricular tachycardia) (HCC) 11/09/2013  . Asthma, stable 11/09/2013    Assessment: Sharon Rowland is a 30 y.o. [id number] at [redacted]w[redacted]d here for active labor.  #Labor: Currently 6cm dilated.  Expectant management. #Pain: Pt desires epidural  #FWB: Category I #ID: GBS positive- on Ampicillin #MOF: Breast #MOC: Interval BTL #Circ: n/a  Katy D Mayo 04/10/2016, 2:00 AM   OB fellow attestation: I have seen and examined this patient; I agree with above documentation in the resident's note.   Sharon Rowland is a 30 y.o. [phone] here for active labor at term  PE: BP 138/79 mmHg  Pulse 100  Temp(Src) 98.2 F (36.8 C)  Resp 18  Ht 5\' 3"  (1.6 m)  Wt 197 lb (89.359 kg)  BMI 34.91 kg/m2  SpO2 100%  LMP 05/24/2015 (Approximate) Gen: calm comfortable, NAD Resp: normal effort, no distress Abd: gravid  ROS, labs, PMH reviewed  Plan: Admit to LD Labor:expectant managment FWB:Cat I ID: GBS Pos, ampicillin  Federico Flake, MD  Family Medicine, OB Fellow 04/10/2016, 2:20 AM

## 2016-04-10 NOTE — MAU Note (Signed)
Patient presents with c/o ctxs 3-10 mins apart. Denies bleeding or LOF. Fetus ACTIVE

## 2016-04-10 NOTE — Anesthesia Preprocedure Evaluation (Signed)
Anesthesia Evaluation  Patient identified by MRN, date of birth, ID band Patient awake    Reviewed: Allergy & Precautions, H&P , NPO status , Patient's Chart, lab work & pertinent test results  History of Anesthesia Complications Negative for: history of anesthetic complications  Airway Mallampati: II  TM Distance: >3 FB Neck ROM: full    Dental no notable dental hx. (+) Teeth Intact   Pulmonary neg pulmonary ROS, asthma ,    Pulmonary exam normal breath sounds clear to auscultation       Cardiovascular Normal cardiovascular exam+ dysrhythmias Supra Ventricular Tachycardia  Rhythm:regular Rate:Normal     Neuro/Psych negative neurological ROS  negative psych ROS   GI/Hepatic negative GI ROS, Neg liver ROS,   Endo/Other  negative endocrine ROS  Renal/GU negative Renal ROS  negative genitourinary   Musculoskeletal   Abdominal   Peds  Hematology negative hematology ROS (+)   Anesthesia Other Findings   Reproductive/Obstetrics (+) Pregnancy                             Anesthesia Physical Anesthesia Plan  ASA: II  Anesthesia Plan: Epidural   Post-op Pain Management:    Induction:   Airway Management Planned:   Additional Equipment:   Intra-op Plan:   Post-operative Plan:   Informed Consent: I have reviewed the patients History and Physical, chart, labs and discussed the procedure including the risks, benefits and alternatives for the proposed anesthesia with the patient or authorized representative who has indicated his/her understanding and acceptance.     Plan Discussed with:   Anesthesia Plan Comments:         Anesthesia Quick Evaluation

## 2016-04-10 NOTE — Anesthesia Procedure Notes (Signed)
Epidural Patient location during procedure: OB  Staffing Anesthesiologist: Venesa Semidey Performed by: anesthesiologist   Preanesthetic Checklist Completed: patient identified, site marked, surgical consent, pre-op evaluation, timeout performed, IV checked, risks and benefits discussed and monitors and equipment checked  Epidural Patient position: sitting Prep: DuraPrep Patient monitoring: heart rate, continuous pulse ox and blood pressure Approach: midline Location: L3-L4 Injection technique: LOR saline  Needle:  Needle type: Tuohy  Needle gauge: 17 G Needle length: 9 cm and 9 Needle insertion depth: 5 cm Catheter type: closed end flexible Catheter size: 20 Guage Catheter at skin depth: 9 cm Test dose: negative  Assessment Events: blood not aspirated, injection not painful, no injection resistance, negative IV test and no paresthesia  Additional Notes Patient identified. Risks/Benefits/Options discussed with patient including but not limited to bleeding, infection, nerve damage, paralysis, failed block, incomplete pain control, headache, blood pressure changes, nausea, vomiting, reactions to medication both or allergic, itching and postpartum back pain. Confirmed with bedside nurse the patient's most recent platelet count. Confirmed with patient that they are not currently taking any anticoagulation, have any bleeding history or any family history of bleeding disorders. Patient expressed understanding and wished to proceed. All questions were answered. Sterile technique was used throughout the entire procedure. Please see nursing notes for vital signs. Test dose was given through epidural needle and negative prior to continuing to dose epidural or start infusion. Warning signs of high block given to the patient including shortness of breath, tingling/numbness in hands, complete motor block, or any concerning symptoms with instructions to call for help. Patient was given  instructions on fall risk and not to get out of bed. All questions and concerns addressed with instructions to call with any issues.   

## 2016-04-10 NOTE — Lactation Note (Signed)
This note was copied from a baby's chart. Lactation Consultation Note  Initial visit made.  Baby is 4 hours old and has breastfed frequently.  This is mom's fifth baby and she has breastfed previous babies successfully.  Reviewed feeding cues and encouraged to feed with any sign of hunger.  Baby is currently sleeping.  Encouraged to call with concerns or latch assist prn.  Patient Name: Sharon Rowland [id number] Date: 04/10/2016 Reason for consult: Initial assessment   Maternal Data    Feeding Feeding Type: Breast Fed Length of feed: 10 min  LATCH Score/Interventions                      Lactation Tools Discussed/Used     Consult Status Consult Status: PRN    Huston Foley 04/10/2016, 3:09 PM

## 2016-04-10 NOTE — Anesthesia Postprocedure Evaluation (Signed)
Anesthesia Post Note  Patient: Sharon Rowland  Procedure(s) Performed: * No procedures listed *  Patient location during evaluation: Mother Baby Anesthesia Type: Epidural Level of consciousness: awake, awake and alert, oriented and patient cooperative Pain management: pain level controlled Vital Signs Assessment: post-procedure vital signs reviewed and stable Respiratory status: spontaneous breathing, nonlabored ventilation and respiratory function stable Cardiovascular status: stable Postop Assessment: no headache, no backache, patient able to bend at knees and no signs of nausea or vomiting Anesthetic complications: no    Last Vitals:  Filed Vitals:   04/10/16 1320 04/10/16 1604  BP: 121/63 110/61  Pulse: 85 88  Temp: 36.6 C 36.8 C  Resp: 18 20    Last Pain:  Filed Vitals:   04/10/16 1605  PainSc: 0-No pain                 Brittini Brubeck L

## 2016-04-11 LAB — RH IG WORKUP (INCLUDES ABO/RH)
ABO/RH(D): A NEG
FETAL SCREEN: NEGATIVE
GESTATIONAL AGE(WKS): 40
Unit division: 0

## 2016-04-11 MED ORDER — IBUPROFEN 600 MG PO TABS: 600.0000 mg | ORAL_TABLET | Freq: Four times a day (QID) | ORAL | Status: DC

## 2016-04-11 NOTE — Progress Notes (Signed)
Post Partum Day 1 Subjective:  Sharon Rowland is a 30 y.o. [id number] [redacted]w[redacted]d s/p NSVD @ 10:30 on 4/19.  No acute events overnight.  Pt denies problems with ambulating, voiding or po intake.  She denies nausea or vomiting.  Pain is well controlled.  She has had flatus. She has had bowel movement.  Lochia Small.  Plan for birth control is bilateral tubal ligation.  Method of Feeding: Breast  Rho gam administered @ 16:35 4/19  Objective: Blood pressure 111/73, pulse 95, temperature 97.9 F (36.6 C), temperature source Oral, resp. rate 18, height 5\' 3"  (1.6 m), weight 89.359 kg (197 lb), last menstrual period 05/24/2015, SpO2 99 %, unknown if currently breastfeeding.  Physical Exam:  General: alert, cooperative and no distress Lochia:normal flow Chest: CTAB Heart: RRR no m/r/g Abdomen: +BS, soft, nontender,  Uterine Fundus: firm, at umbilicus DVT Evaluation: No evidence of DVT seen on physical exam. Extremities: trace edema   Recent Labs  04/10/16 0126  HGB 10.3*  HCT 32.3*    Assessment/Plan:  ASSESSMENT: Sharon Rowland is a 30 y.o. [id number] [redacted]w[redacted]d s/p NSVD. Doing well, baby breastfeeding well, patient and family with no concerns, interested in discharge later today.   Discharge home   LOS: 1 day   Samuel H [place] 04/11/2016, 7:37 AM   OB fellow attestation:  I have seen and examined this patient; I agree with above documentation in the PA student's note. Please see discharge summary for my official documentation.   Federico Flake, MD 3:01 PM

## 2016-04-11 NOTE — Discharge Summary (Signed)
OB Discharge Summary     Patient Name: Sharon Rowland DOB: 06/29/1986 MRN: [id number]  Date of admission: 04/10/2016 Delivering MD: Shonna Chock [place]   Date of discharge: 04/11/2016  Admitting diagnosis: 40.3 WEEKS CTX Intrauterine pregnancy: [redacted]w[redacted]d     Secondary diagnosis:  Principal Problem:   SVD (spontaneous vaginal delivery) Active Problems:   Paroxysmal SVT (supraventricular tachycardia) (HCC)   Asthma, stable   Rh negative state in antepartum period   Abnormal Pap smear of cervix   Supervision of normal pregnancy   Migraines   Limited prenatal care, antepartum   Recurrent UTI (urinary tract infection) complicating pregnancy  Additional problems: none     Discharge diagnosis: Term Pregnancy Delivered                                                                                                Post partum procedures:rhogam  Augmentation: none  Complications: None  Hospital course:  Onset of Labor With Vaginal Delivery     30 y.o. yo [id number] at [redacted]w[redacted]d was admitted in Active Labor on 04/10/2016. Patient had an uncomplicated labor course as follows:  Membrane Rupture Time/Date: 8:53 AM ,04/10/2016   Intrapartum Procedures: Episiotomy: None [1]                                         Lacerations:  None [1]  Patient had a delivery of a Viable infant. 04/10/2016  Information for the patient's newborn:  Canary, Madhavan [[id number]]  Delivery Method: Vaginal, Spontaneous Delivery (Filed from Delivery Summary)   Pateint had an uncomplicated postpartum course.  She is ambulating, tolerating a regular diet, passing flatus, and urinating well. Patient is discharged home in stable condition on 04/11/2016.    Physical exam  Filed Vitals:   04/10/16 1604 04/10/16 1813 04/11/16 0643 04/11/16 1011  BP: 110/61 112/64 111/73 122/90  Pulse: 88 78 95 90  Temp: 98.2 F (36.8 C) 98.3 F (36.8 C) 97.9 F (36.6 C)   TempSrc: Axillary Oral Oral   Resp: 20 18 18    Height:       Weight:      SpO2:  99%     General: alert, cooperative and no distress Lochia: appropriate Uterine Fundus: firm Incision: N/A DVT Evaluation: No evidence of DVT seen on physical exam. Negative Homan's sign. Labs: Lab Results  Component Value Date   WBC 12.9* 04/10/2016   HGB 10.3* 04/10/2016   HCT 32.3* 04/10/2016   MCV 74.4* 04/10/2016   PLT 259 04/10/2016   CMP Latest Ref Rng 01/31/2016  Glucose 65 - 99 mg/dL 81  BUN 6 - 20 mg/dL 3(L)  Creatinine 8.01 - 1.00 mg/dL 6.55  Sodium 374 - 827 mmol/L 137  Potassium 3.5 - 5.2 mmol/L 3.8  Chloride 96 - 106 mmol/L 99  CO2 18 - 29 mmol/L 21  Calcium 8.7 - 10.2 mg/dL 9.1  Total Protein 6.0 - 8.5 g/dL 5.9(L)  Total Bilirubin 0.0 - 1.2 mg/dL 0.2  Alkaline Phos 39 -  117 IU/L 75  AST 0 - 40 IU/L 5  ALT 0 - 32 IU/L 8    Discharge instruction: per After Visit Summary and "Baby and Me Booklet".  After visit meds:    Medication List    TAKE these medications        acetaminophen 325 MG tablet  Commonly known as:  TYLENOL  Take 650 mg by mouth every 6 (six) hours as needed. Reported on 01/30/2016     albuterol 108 (90 Base) MCG/ACT inhaler  Commonly known as:  PROVENTIL HFA;VENTOLIN HFA  Inhale 2 puffs into the lungs every 6 (six) hours as needed for wheezing or shortness of breath. Reported on 04/08/2016     ibuprofen 600 MG tablet  Commonly known as:  ADVIL,MOTRIN  Take 1 tablet (600 mg total) by mouth every 6 (six) hours.     metoprolol succinate 50 MG 24 hr tablet  Commonly known as:  TOPROL-XL  Take 1 tablet (50 mg total) by mouth daily.     multivitamin-prenatal 27-0.8 MG Tabs tablet  Take 1 tablet by mouth daily at 12 noon.        Diet: routine diet  Activity: Advance as tolerated. Pelvic rest for 6 weeks.   Outpatient follow up:2 weeks- tubal consult, schedule tubal Follow up Appt:Future Appointments Date Time Provider Department Center  05/16/2016 10:00 AM Jacklyn Shell, CNM [facility]    Follow up Visit:No Follow-up on file.  Postpartum contraception: Tubal Ligation  Newborn Data: Live born female  Birth Weight: 9 lb 13 oz (4451 g) APGAR: 9, 9  Baby Feeding: Breast Disposition:home with mother  04/11/2016 Federico Flake, MD  Attending Physician: Catalina Antigua MD

## 2016-04-13 LAB — TYPE AND SCREEN
ABO/RH(D): A NEG
Antibody Screen: POSITIVE
DAT, IgG: NEGATIVE
UNIT DIVISION: 0
Unit division: 0

## 2016-04-14 ENCOUNTER — Inpatient Hospital Stay (HOSPITAL_COMMUNITY): Admission: RE | Admit: 2016-04-14 | Payer: Managed Care, Other (non HMO) | Source: Ambulatory Visit

## 2016-05-07 ENCOUNTER — Encounter: Payer: Self-pay | Admitting: Family Medicine

## 2016-05-07 ENCOUNTER — Ambulatory Visit (INDEPENDENT_AMBULATORY_CARE_PROVIDER_SITE_OTHER): Payer: Managed Care, Other (non HMO) | Admitting: Family Medicine

## 2016-05-07 VITALS — Temp 98.4°F | Ht 64.0 in | Wt 175.6 lb

## 2016-05-07 DIAGNOSIS — J020 Streptococcal pharyngitis: Secondary | ICD-10-CM | POA: Diagnosis not present

## 2016-05-07 MED ORDER — AZITHROMYCIN 250 MG PO TABS: ORAL_TABLET | ORAL | Status: DC

## 2016-05-07 NOTE — Progress Notes (Signed)
   Subjective:    Patient ID: Sharon Rowland, female    DOB: [birth year], 30 y.o.   MRN: [id number]  Sore Throat  This is a new problem. The current episode started in the past 7 days. She has had exposure to strep. Exposure to: 2 kids with strep. She has tried acetaminophen and NSAIDs for the symptoms.   She was exposed to other children of hers that had strep now she is feeling sore throat pain with swallowing low energy doesn't feel good denies high fever chills sweats nausea vomiting diarrhea   Review of Systems     Objective:   Physical Exam  Throat erythematous with some exudate  Neck no masses lungs are clear hearts regular    Assessment & Plan:  Strep throat antibody treatment recommended. I don't recommend strep testing because strong likelihood of this being strep based on family exposure

## 2016-05-16 ENCOUNTER — Ambulatory Visit (INDEPENDENT_AMBULATORY_CARE_PROVIDER_SITE_OTHER): Payer: Managed Care, Other (non HMO) | Admitting: Advanced Practice Midwife

## 2016-05-16 ENCOUNTER — Encounter: Payer: Self-pay | Admitting: Advanced Practice Midwife

## 2016-05-16 NOTE — Progress Notes (Signed)
  Sharon Rowland is a 30 y.o. who presents for a postpartum visit. She is 5 weeks postpartum following a spontaneous vaginal delivery. I have fully reviewed the prenatal and intrapartum course. The delivery was at 40.3 gestational weeks.  Anesthesia: epidural. Postpartum course has been uneventful. Baby's course has been uneventful. Baby is feeding by breast. Bleeding: thin lochia. Bowel function is normal. Bladder function is normal. Patient is not sexually active. Contraception method is plans BTL. Postpartum depression screening: negative.   Current outpatient prescriptions:  .  acetaminophen (TYLENOL) 325 MG tablet, Take 650 mg by mouth every 6 (six) hours as needed. Reported on 01/30/2016, Disp: , Rfl:  .  albuterol (PROVENTIL HFA;VENTOLIN HFA) 108 (90 BASE) MCG/ACT inhaler, Inhale 2 puffs into the lungs every 6 (six) hours as needed for wheezing or shortness of breath. Reported on 04/08/2016, Disp: , Rfl:  .  ibuprofen (ADVIL,MOTRIN) 600 MG tablet, Take 1 tablet (600 mg total) by mouth every 6 (six) hours., Disp: 90 tablet, Rfl: 0 .  Prenatal Vit-Fe Fumarate-FA (MULTIVITAMIN-PRENATAL) 27-0.8 MG TABS tablet, Take 1 tablet by mouth daily at 12 noon., Disp: , Rfl:   Review of Systems   Constitutional: Negative for fever and chills Eyes: Negative for visual disturbances Respiratory: Negative for shortness of breath, dyspnea Cardiovascular: Negative for chest pain or palpitations  Gastrointestinal: Negative for vomiting, diarrhea and constipation Genitourinary: Negative for dysuria and urgency Musculoskeletal: Negative for back pain, joint pain, myalgias  Neurological: Negative for dizziness and headaches   Objective:     Filed Vitals:   05/16/16 1012  BP: 110/80  Pulse: 60   General:  alert, cooperative and no distress   Breasts:  negative  Lungs: clear to auscultation bilaterally  Heart:  regular rate and rhythm  Abdomen: Soft, nontender   Vulva:  normal  Vagina: normal vagina   Cervix:  closed  Corpus: Well involuted     Rectal Exam: no hemorrhoids        Assessment:    normal postpartum exam.  Plan:    1. Contraception: Plans salpingectomy w/ ablation 2. Follow up in:  Needs colpo and preop for BTL

## 2016-05-24 ENCOUNTER — Encounter: Payer: Managed Care, Other (non HMO) | Admitting: Obstetrics & Gynecology

## 2016-07-12 ENCOUNTER — Telehealth: Payer: Self-pay | Admitting: Family Medicine

## 2016-07-12 NOTE — Telephone Encounter (Signed)
Notified the patient that the medication has been called in.

## 2016-07-12 NOTE — Telephone Encounter (Signed)
The prescription for Fioricet was called in #15 one every 6 hours as needed for severe headache caution drowsiness nurse's-please put this in Epic

## 2016-07-12 NOTE — Telephone Encounter (Signed)
Please let the patient know that I sent her medicine-Fioricet- via a phone call-I left a voicemail on on the automated machine- to [facility].

## 2016-07-12 NOTE — Telephone Encounter (Signed)
Pt called wanting to know if we can refill her butalbital-acetaminophen-caffeine (FIORICET) 50-325-40 MG tablet. Pt [facility] prescribed [facility] for migraines. She is no longer being seen by [facility]. Please advise.

## 2016-09-03 ENCOUNTER — Ambulatory Visit (INDEPENDENT_AMBULATORY_CARE_PROVIDER_SITE_OTHER): Payer: Managed Care, Other (non HMO) | Admitting: Urology

## 2016-09-03 DIAGNOSIS — N819 Female genital prolapse, unspecified: Secondary | ICD-10-CM | POA: Diagnosis not present

## 2016-09-05 ENCOUNTER — Ambulatory Visit (INDEPENDENT_AMBULATORY_CARE_PROVIDER_SITE_OTHER): Payer: Managed Care, Other (non HMO) | Admitting: Obstetrics & Gynecology

## 2016-09-05 ENCOUNTER — Encounter: Payer: Self-pay | Admitting: Obstetrics & Gynecology

## 2016-09-05 VITALS — BP 90/60 | HR 80 | Wt 175.0 lb

## 2016-09-05 DIAGNOSIS — N8110 Cystocele, unspecified: Secondary | ICD-10-CM

## 2016-09-05 DIAGNOSIS — N814 Uterovaginal prolapse, unspecified: Secondary | ICD-10-CM

## 2016-09-05 DIAGNOSIS — IMO0002 Reserved for concepts with insufficient information to code with codable children: Secondary | ICD-10-CM

## 2016-09-05 MED ORDER — ESTRADIOL 0.1 MG/GM VA CREA: 1.0000 | TOPICAL_CREAM | Freq: Every day | VAGINAL | 3 refills | Status: DC

## 2016-09-05 MED ORDER — NORELGESTROMIN-ETH ESTRADIOL 150-35 MCG/24HR TD PTWK: 1.0000 | MEDICATED_PATCH | TRANSDERMAL | 12 refills | Status: DC

## 2016-09-05 MED ORDER — ESTRADIOL 0.1 MG/GM VA CREA: 1.0000 | TOPICAL_CREAM | Freq: Every day | VAGINAL | 12 refills | Status: DC

## 2016-09-05 MED ORDER — NORELGESTROMIN-ETH ESTRADIOL 150-35 MCG/24HR TD PTWK: 1.0000 | MEDICATED_PATCH | TRANSDERMAL | 3 refills | Status: DC

## 2016-09-05 NOTE — Progress Notes (Signed)
Chief Complaint  Patient presents with  . Referral    uterine prolapse    Blood pressure 90/60, pulse 80, weight 175 lb (79.4 kg), currently breastfeeding.  30 y.o. [id number] No LMP recorded. The current method of family planning is breastfeeding.  Outpatient Encounter Prescriptions as of 09/05/2016  Medication Sig  . albuterol (PROVENTIL HFA;VENTOLIN HFA) 108 (90 BASE) MCG/ACT inhaler Inhale 2 puffs into the lungs every 6 (six) hours as needed for wheezing or shortness of breath. Reported on 04/08/2016  . estradiol (ESTRACE VAGINAL) 0.1 MG/GM vaginal cream Place 1 Applicatorful vaginally at bedtime.  . norelgestromin-ethinyl estradiol (ORTHO EVRA) 150-35 MCG/24HR transdermal patch Place 1 patch onto the skin once a week.  . [DISCONTINUED] acetaminophen (TYLENOL) 325 MG tablet Take 650 mg by mouth every 6 (six) hours as needed. Reported on 01/30/2016  . [DISCONTINUED] ibuprofen (ADVIL,MOTRIN) 600 MG tablet Take 1 tablet (600 mg total) by mouth every 6 (six) hours.  . [DISCONTINUED] Prenatal Vit-Fe Fumarate-FA (MULTIVITAMIN-PRENATAL) 27-0.8 MG TABS tablet Take 1 tablet by mouth daily at 12 noon.   No facility-administered encounter medications on file as of 09/05/2016.     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) Some stress related urinary loss Has felt her bladder which is new  Objective General WDWN female NAD Vulva:  normal appearing vulva with no masses, tenderness or lesions Vagina:  normal mucosa, no discharge, Grade 2 cystocoele, grade 2 uterine descent, rectum well supported, uterus is really pushing the bladder Cervix:  no cervical motion tenderness and no lesions Uterus:  normal size, contour, position, consistency, mobility, non-tender Adnexa: ovaries:present,  normal adnexa in size, nontender and no masses   Pertinent ROS No burning with urination, frequency or urgency No  nausea, vomiting or diarrhea Nor fever chills or other constitutional symptoms   Labs or studies none    Impression Diagnoses this Encounter::   ICD-9-CM ICD-10-CM   1. Uterine prolapse 618.1 N81.4     Established relevant diagnosis(es): Recent vaginal delivery, [id number]   Plan/Recommendations: Meds ordered this encounter  Medications  . estradiol (ESTRACE VAGINAL) 0.1 MG/GM vaginal cream    Sig: Place 1 Applicatorful vaginally at bedtime.    Dispense:  42.5 g    Refill:  12  . norelgestromin-ethinyl estradiol (ORTHO EVRA) 150-35 MCG/24HR transdermal patch    Sig: Place 1 patch onto the skin once a week.    Dispense:  3 patch    Refill:  12    Labs or Scans Ordered: No orders of the defined types were placed in this encounter.   Management:: Will use local estrogen, encourage Kegel's, will reassess in the future, hope we can delay for many years to avoid repeat defects and surgery Pt aware understands and agrees with the plan  Follow up Return if symptoms worsen or fail to improve.        Face to face time:  15 minutes  Greater than 50% of the visit time was spent in counseling and coordination of care with the patient.  The summary and outline of the counseling and care coordination is summarized in the note above.   All questions were answered.  Past Medical History:  Diagnosis Date  . Allergic rhinitis   . Anemia   . Asthma    last used inhaler 1 month ago  . GERD (gastroesophageal reflux disease)   . Pelvic pain in antepartum period in  second trimester 01/17/2014  . Pregnant 08/01/2015  . PSVT (paroxysmal supraventricular tachycardia) (HCC)    could not be confirmed with 2016 study    Past Surgical History:  Procedure Laterality Date  . SUPRAVENTRICULAR TACHYCARDIA ABLATION  01/05/2015   unsuccessful  . SUPRAVENTRICULAR TACHYCARDIA ABLATION N/A 01/05/2015   Procedure: SUPRAVENTRICULAR TACHYCARDIA ABLATION;  Surgeon: Marinus MawGregg W Taylor, MD;  [facility];  Service: Cardiovascular;  Laterality: N/A;    OB History    Gravida Para Term Preterm AB Living   5 5 5     5    SAB TAB Ectopic Multiple Live Births         0 5      Allergies  Allergen Reactions  . Latex Dermatitis    Social History   Social History  . Marital status: Married    Spouse name: N/A  . Number of children: N/A  . Years of education: N/A   Social History Main Topics  . Smoking status: Never Smoker  . Smokeless tobacco: Never Used  . Alcohol use No  . Drug use: No  . Sexual activity: Not Currently    Birth control/ protection: None   Other Topics Concern  . None   Social History Narrative  . None    Family History  Problem Relation Age of Onset  . Hypertension Mother   . Hyperlipidemia Mother   . Multiple sclerosis Mother   . COPD Mother   . Cancer Mother     melanoma x 2  . Diabetes Mother   . Heart disease Father     PSVT  . Hyperlipidemia Brother   . Hypertension Brother   . Heart disease Maternal Grandfather     heart attack  . Cancer Paternal Grandmother     pancreatic cancer  . Heart disease Paternal Grandmother     MI  . Hyperlipidemia Paternal Grandmother   . Hypertension Paternal Grandmother

## 2016-11-04 ENCOUNTER — Other Ambulatory Visit: Payer: Self-pay | Admitting: Family Medicine

## 2016-11-08 NOTE — Telephone Encounter (Signed)
1 refill 

## 2016-12-09 ENCOUNTER — Ambulatory Visit: Payer: Managed Care, Other (non HMO) | Admitting: Adult Health

## 2016-12-23 NOTE — L&D Delivery Note (Signed)
  Patient is 31 y.o. [id number] [redacted]w[redacted]d admitted in SOL, hx of SVT, limited PNC.    Delivery Note At 2:04 AM a viable female was delivered via Vaginal, Spontaneous Delivery (Presentation: vertex; OP).  APGAR: 9, 10; weight 8 lb 1.3 oz (3665 g).   Placenta status: delivered spontaneously with gentle traction, in tact.  Cord: 3vc with the following complications: loose nuchal.  Cord pH: not sent  Anesthesia:  epidural Episiotomy: None Lacerations: None Suture Repair: n/a Est. Blood Loss (mL): 250  Mom to postpartum.  Baby to Couplet care / Skin to Skin.  Tillman Sers 08/11/2017, 2:25 AM      Upon arrival patient was complete and pushing. She pushed with good maternal effort to deliver a healthy baby boy. Baby delivered without difficulty, was noted to have good tone and place on maternal abdomen for oral suctioning, drying and stimulation. Delayed cord clamping performed. Placenta delivered intact with 3V cord. Vaginal canal and perineum was inspected and in tact; hemostatic. Pitocin was started and uterus massaged until bleeding slowed. Counts of sharps, instruments, and lap pads were all correct.   Tillman Sers, DO PGY-2 8/20/20182:26 AM  Patient is a [id number] at [redacted]w[redacted]d who was admitted with SOL, significant hx of grandmultip with prenatal course significant for no visits since 28wks, Rh neg.  She progressed without augmentation.  I was gloved and present for delivery in its entirety.  Second stage of labor progressed, baby delivered after a few contractions in direct OP.  mild decels during second stage noted.  Complications: none  Lacerations: none  EBL: 250 cc  Cam Hai, CNM 6:25 AM 08/11/2017

## 2016-12-25 ENCOUNTER — Ambulatory Visit: Payer: Managed Care, Other (non HMO) | Admitting: Adult Health

## 2016-12-30 ENCOUNTER — Encounter: Payer: Self-pay | Admitting: Adult Health

## 2016-12-30 ENCOUNTER — Ambulatory Visit (INDEPENDENT_AMBULATORY_CARE_PROVIDER_SITE_OTHER): Payer: Managed Care, Other (non HMO) | Admitting: Adult Health

## 2016-12-30 VITALS — BP 116/72 | HR 69 | Ht 63.0 in | Wt 168.0 lb

## 2016-12-30 DIAGNOSIS — N814 Uterovaginal prolapse, unspecified: Secondary | ICD-10-CM

## 2016-12-30 DIAGNOSIS — I471 Supraventricular tachycardia: Secondary | ICD-10-CM | POA: Diagnosis not present

## 2016-12-30 DIAGNOSIS — N926 Irregular menstruation, unspecified: Secondary | ICD-10-CM | POA: Diagnosis not present

## 2016-12-30 DIAGNOSIS — Z3201 Encounter for pregnancy test, result positive: Secondary | ICD-10-CM | POA: Diagnosis not present

## 2016-12-30 DIAGNOSIS — Z3490 Encounter for supervision of normal pregnancy, unspecified, unspecified trimester: Secondary | ICD-10-CM

## 2016-12-30 DIAGNOSIS — O3680X0 Pregnancy with inconclusive fetal viability, not applicable or unspecified: Secondary | ICD-10-CM

## 2016-12-30 LAB — POCT URINE PREGNANCY: Preg Test, Ur: POSITIVE — AB

## 2016-12-30 MED ORDER — METOPROLOL TARTRATE 25 MG PO TABS
ORAL_TABLET | ORAL | 3 refills | Status: DC
Start: 2016-12-30 — End: 2017-04-18

## 2016-12-30 NOTE — Patient Instructions (Signed)
First Trimester of Pregnancy  The first trimester of pregnancy is from week 1 until the end of week 12 (months 1 through 3). A week after a sperm fertilizes an egg, the egg will implant on the wall of the uterus. This embryo will begin to develop into a baby. Genes from you and your partner are forming the baby. The female genes determine whether the baby is a boy or a girl. At 6-8 weeks, the eyes and face are formed, and the heartbeat can be seen on ultrasound. At the end of 12 weeks, all the baby's organs are formed.   Now that you are pregnant, you will want to do everything you can to have a healthy baby. Two of the most important things are to get good prenatal care and to follow your health care provider's instructions. Prenatal care is all the medical care you receive before the baby's birth. This care will help prevent, find, and treat any problems during the pregnancy and childbirth.  BODY CHANGES  Your body goes through many changes during pregnancy. The changes vary from woman to woman.   · You may gain or lose a couple of pounds at first.  · You may feel sick to your stomach (nauseous) and throw up (vomit). If the vomiting is uncontrollable, call your health care provider.  · You may tire easily.  · You may develop headaches that can be relieved by medicines approved by your health care provider.  · You may urinate more often. Painful urination may mean you have a bladder infection.  · You may develop heartburn as a result of your pregnancy.  · You may develop constipation because certain hormones are causing the muscles that push waste through your intestines to slow down.  · You may develop hemorrhoids or swollen, bulging veins (varicose veins).  · Your breasts may begin to grow larger and become tender. Your nipples may stick out more, and the tissue that surrounds them (areola) may become darker.  · Your gums may bleed and may be sensitive to brushing and flossing.   · Dark spots or blotches (chloasma, mask of pregnancy) may develop on your face. This will likely fade after the baby is born.  · Your menstrual periods will stop.  · You may have a loss of appetite.  · You may develop cravings for certain kinds of food.  · You may have changes in your emotions from day to day, such as being excited to be pregnant or being concerned that something may go wrong with the pregnancy and baby.  · You may have more vivid and strange dreams.  · You may have changes in your hair. These can include thickening of your hair, rapid growth, and changes in texture. Some women also have hair loss during or after pregnancy, or hair that feels dry or thin. Your hair will most likely return to normal after your baby is born.  WHAT TO EXPECT AT YOUR PRENATAL VISITS  During a routine prenatal visit:  · You will be weighed to make sure you and the baby are growing normally.  · Your blood pressure will be taken.  · Your abdomen will be measured to track your baby's growth.  · The fetal heartbeat will be listened to starting around week 10 or 12 of your pregnancy.  · Test results from any previous visits will be discussed.  Your health care provider may ask you:  · How you are feeling.  · If you   are feeling the baby move.  · If you have had any abnormal symptoms, such as leaking fluid, bleeding, severe headaches, or abdominal cramping.  · If you are using any tobacco products, including cigarettes, chewing tobacco, and electronic cigarettes.  · If you have any questions.  Other tests that may be performed during your first trimester include:  · Blood tests to find your blood type and to check for the presence of any previous infections. They will also be used to check for low iron levels (anemia) and Rh antibodies. Later in the pregnancy, blood tests for diabetes will be done along with other tests if problems develop.  · Urine tests to check for infections, diabetes, or protein in the urine.   · An ultrasound to confirm the proper growth and development of the baby.  · An amniocentesis to check for possible genetic problems.  · Fetal screens for spina bifida and Down syndrome.  · You may need other tests to make sure you and the baby are doing well.  · HIV (human immunodeficiency virus) testing. Routine prenatal testing includes screening for HIV, unless you choose not to have this test.  HOME CARE INSTRUCTIONS   Medicines  · Follow your health care provider's instructions regarding medicine use. Specific medicines may be either safe or unsafe to take during pregnancy.  · Take your prenatal vitamins as directed.  · If you develop constipation, try taking a stool softener if your health care provider approves.  Diet  · Eat regular, well-balanced meals. Choose a variety of foods, such as meat or vegetable-based protein, fish, milk and low-fat dairy products, vegetables, fruits, and whole grain breads and cereals. Your health care provider will help you determine the amount of weight gain that is right for you.  · Avoid raw meat and uncooked cheese. These carry germs that can cause birth defects in the baby.  · Eating four or five small meals rather than three large meals a day may help relieve nausea and vomiting. If you start to feel nauseous, eating a few soda crackers can be helpful. Drinking liquids between meals instead of during meals also seems to help nausea and vomiting.  · If you develop constipation, eat more high-fiber foods, such as fresh vegetables or fruit and whole grains. Drink enough fluids to keep your urine clear or pale yellow.  Activity and Exercise  · Exercise only as directed by your health care provider. Exercising will help you:    Control your weight.    Stay in shape.    Be prepared for labor and delivery.  · Experiencing pain or cramping in the lower abdomen or low back is a good sign that you should stop exercising. Check with your health care provider  before continuing normal exercises.  · Try to avoid standing for long periods of time. Move your legs often if you must stand in one place for a long time.  · Avoid heavy lifting.  · Wear low-heeled shoes, and practice good posture.  · You may continue to have sex unless your health care provider directs you otherwise.  Relief of Pain or Discomfort  · Wear a good support bra for breast tenderness.    · Take warm sitz baths to soothe any pain or discomfort caused by hemorrhoids. Use hemorrhoid cream if your health care provider approves.    · Rest with your legs elevated if you have leg cramps or low back pain.  · If you develop varicose veins in your   legs, wear support hose. Elevate your feet for 15 minutes, 3-4 times a day. Limit salt in your diet.  Prenatal Care  · Schedule your prenatal visits by the twelfth week of pregnancy. They are usually scheduled monthly at first, then more often in the last 2 months before delivery.  · Write down your questions. Take them to your prenatal visits.  · Keep all your prenatal visits as directed by your health care provider.  Safety  · Wear your seat belt at all times when driving.  · Make a list of emergency phone numbers, including numbers for family, friends, the hospital, and police and fire departments.  General Tips  · Ask your health care provider for a referral to a local prenatal education class. Begin classes no later than at the beginning of month 6 of your pregnancy.  · Ask for help if you have counseling or nutritional needs during pregnancy. Your health care provider can offer advice or refer you to specialists for help with various needs.  · Do not use hot tubs, steam rooms, or saunas.  · Do not douche or use tampons or scented sanitary pads.  · Do not cross your legs for long periods of time.  · Avoid cat litter boxes and soil used by cats. These carry germs that can cause birth defects in the baby and possibly loss of the fetus by miscarriage or stillbirth.   · Avoid all smoking, herbs, alcohol, and medicines not prescribed by your health care provider. Chemicals in these affect the formation and growth of the baby.  · Do not use any tobacco products, including cigarettes, chewing tobacco, and electronic cigarettes. If you need help quitting, ask your health care provider. You may receive counseling support and other resources to help you quit.  · Schedule a dentist appointment. At home, brush your teeth with a soft toothbrush and be gentle when you floss.  SEEK MEDICAL CARE IF:   · You have dizziness.  · You have mild pelvic cramps, pelvic pressure, or nagging pain in the abdominal area.  · You have persistent nausea, vomiting, or diarrhea.  · You have a bad smelling vaginal discharge.  · You have pain with urination.  · You notice increased swelling in your face, hands, legs, or ankles.  SEEK IMMEDIATE MEDICAL CARE IF:   · You have a fever.  · You are leaking fluid from your vagina.  · You have spotting or bleeding from your vagina.  · You have severe abdominal cramping or pain.  · You have rapid weight gain or loss.  · You vomit blood or material that looks like coffee grounds.  · You are exposed to German measles and have never had them.  · You are exposed to fifth disease or chickenpox.  · You develop a severe headache.  · You have shortness of breath.  · You have any kind of trauma, such as from a fall or a car accident.     This information is not intended to replace advice given to you by your health care provider. Make sure you discuss any questions you have with your health care provider.     Document Released: 12/03/2001 Document Revised: 12/30/2014 Document Reviewed: 10/19/2013  Elsevier Interactive Patient Education ©2017 [employer]

## 2016-12-30 NOTE — Progress Notes (Signed)
Subjective:     Patient ID: Sharon Rowland, female   DOB: 08-Jan-1986, 31 y.o.   MRN: [id number]  HPI  Sharon Rowland is a 31 year old white female, married in for UPT, has missed a period and had +HPT, was on patch and did not have withdrawal bleed and felt bad.Has had episodes of SVT and has uterine prolapse and thinks bladder down too.   Review of Systems +missed period +uterine prolapse Episodes SVT Reviewed past medical,surgical, social and family history. Reviewed medications and allergies.     Objective:   Physical Exam BP 116/72 (BP Location: Left Arm, Patient Position: Sitting, Cuff Size: Normal)   Pulse 69   Ht 5\' 3"  (1.6 m)   Wt 168 lb (76.2 kg)   LMP 10/23/2016 (Approximate)   Breastfeeding? Yes   BMI 29.76 kg/m UPT + about 9+5 weeks by LMP with EDD 07/30/17,Skin warm and dry. Neck: mid line trachea, normal thyroid, good ROM, no lymphadenopathy noted. Lungs: clear to ausculation bilaterally. Cardiovascular: regular rate and rhythm.Abdomen is soft and non tender.PHQ 2 score 0. Will get back in for dating [place] and see Dr Despina Hidden to discuss prolapse and pregnancy, she requests metoprolol be restarted.     Assessment:     1. Pregnancy examination or test, positive result   2. Pregnancy, unspecified gestational age   67. Encounter to determine fetal viability of pregnancy, single or unspecified fetus   4. Paroxysmal SVT (supraventricular tachycardia) (HCC)   5. Uterine prolapse       Plan:     Meds ordered this encounter  Medications  . metoprolol tartrate (LOPRESSOR) 25 MG tablet    Sig: Take 1 daily    Dispense:  30 tablet    Refill:  3    Order Specific Question:   Supervising Provider    Answer:   Duane Lope H [[id number]]  Take prenatal vitamins, has at home Return in 1 week for dating [place] and then see Dr Despina Hidden to discuss prolapse Review handout on first trimester

## 2017-01-07 ENCOUNTER — Ambulatory Visit (INDEPENDENT_AMBULATORY_CARE_PROVIDER_SITE_OTHER): Payer: Managed Care, Other (non HMO) | Admitting: Obstetrics & Gynecology

## 2017-01-07 ENCOUNTER — Encounter: Payer: Self-pay | Admitting: Obstetrics & Gynecology

## 2017-01-07 ENCOUNTER — Ambulatory Visit (INDEPENDENT_AMBULATORY_CARE_PROVIDER_SITE_OTHER): Payer: Managed Care, Other (non HMO)

## 2017-01-07 VITALS — BP 90/60 | HR 76 | Wt 167.0 lb

## 2017-01-07 DIAGNOSIS — Z3A10 10 weeks gestation of pregnancy: Secondary | ICD-10-CM

## 2017-01-07 DIAGNOSIS — N814 Uterovaginal prolapse, unspecified: Secondary | ICD-10-CM | POA: Diagnosis not present

## 2017-01-07 DIAGNOSIS — I471 Supraventricular tachycardia: Secondary | ICD-10-CM

## 2017-01-07 DIAGNOSIS — O3680X0 Pregnancy with inconclusive fetal viability, not applicable or unspecified: Secondary | ICD-10-CM | POA: Diagnosis not present

## 2017-01-07 DIAGNOSIS — O3491 Maternal care for abnormality of pelvic organ, unspecified, first trimester: Secondary | ICD-10-CM

## 2017-01-07 DIAGNOSIS — Z331 Pregnant state, incidental: Secondary | ICD-10-CM

## 2017-01-07 NOTE — Progress Notes (Signed)
[place] 9+6 wks,single IUP w/ys,pos fht 173 bpm,normal left ovary,simple right corpus luteal cyst 2.7 x 2.1 x 1.9 cm,EDD 08/06/2017

## 2017-01-07 NOTE — Progress Notes (Signed)
Follow up appointment for results  Chief Complaint  Patient presents with  . Follow-up    ultrasound    Blood pressure 90/60, pulse 76, weight 167 lb (75.8 kg), last menstrual period 10/23/2016, currently breastfeeding.  US Ob Comp Less 14 Wks  Result Date: 01/07/2017 DATING AND VIABILITY SONOGRAM Sharon Rowland is a 31 y.o. year old [id number] with LMP 10/23/2016 which would correlate to  10+[redacted] weeks gestation.  She has irregular menstrual cycles.   She is here today for a confirmatory initial sonogram. GESTATION: SINGLETON   FETAL ACTIVITY:          Heart rate         173          The fetus is active. PLACENTA LOCALIZATION:  posterior GRADE 0 CERVIX: Appears closed ADNEXA: Normal left ovary,simple right corpus luteal cyst 2.7 x 2.1 x 1.9 cm GESTATIONAL AGE AND  BIOMETRICS: Gestational criteria: Estimated Date of Delivery: 07/30/17 by LMP now at [redacted]w[redacted]d Previous Scans:0    CROWN RUMP LENGTH           31.9 mm         9+6 weeks                                                                      AVERAGE EGA(BY THIS SCAN):  9+6 weeks WORKING EDD( early ultrasound ):  08/06/2017  TECHNICIAN COMMENTS: [place] 9+6 wks,single IUP w/ys,pos fht 173 bpm,normal left ovary,simple right corpus luteal cyst 2.7 x 2.1 x 1.9 cm A copy of this report including all images has been saved and backed up to a second source for retrieval if needed. All measures and details of the anatomical scan, placentation, fluid volume and pelvic anatomy are contained in that report. Sharon Rowland 01/07/2017 1:25 PM Clinical Impression and recommendations: I have reviewed the sonogram results above, combined with the patient's current clinical course, below are my impressions and any appropriate recommendations for management based on the sonographic findings. Viable early IUP [id number] Estimated Date of Delivery: 08/06/17 today's sonogram Normal general sonographic findings Recommend routine care unless otherwise clinically indicated [name] H  01/07/2017 2:35 PM      MEDS ordered this encounter: No orders of the defined types were placed in this encounter.   Orders for this encounter: Orders Placed This Encounter  Procedures  . US Fetal Nuchal Translucency Measurement    Impression: 1. Encounter to determine fetal viability of pregnancy, single or unspecified fetus  - US Fetal Nuchal Translucency Measurement; Future  2. Uterine prolapse  - US Fetal Nuchal Translucency Measurement; Future  3. Paroxysmal SVT (supraventricular tachycardia) (HCC)  - US Fetal Nuchal Translucency Measurement; Future   Plan: New OB and NT 2 weeks  Follow Up: Return in about 2 weeks (around 01/22/2017) for NT sono, New ob visit, IT.       Face to face time:  10 minutes  Greater than 50% of the visit time was spent in counseling and coordination of care with the patient.  The summary and outline of the counseling and care coordination is summarized in the note above.   All questions were answered.  Past Medical History:  Diagnosis Date  . Allergic rhinitis   . Anemia   .  Asthma    last used inhaler 1 month ago  . Female bladder prolapse   . GERD (gastroesophageal reflux disease)   . Pelvic pain in antepartum period in second trimester 01/17/2014  . Pregnant 08/01/2015  . PSVT (paroxysmal supraventricular tachycardia) (HCC)    could not be confirmed with 2016 study  . Rectocele   . Urethral prolapse   . Uterine prolapse   . Vaginal Pap smear, abnormal     Past Surgical History:  Procedure Laterality Date  . SUPRAVENTRICULAR TACHYCARDIA ABLATION  01/05/2015   unsuccessful  . SUPRAVENTRICULAR TACHYCARDIA ABLATION N/A 01/05/2015   Procedure: SUPRAVENTRICULAR TACHYCARDIA ABLATION;  Surgeon: Marinus Maw, MD;  Location: [facility] CATH LAB;  Service: Cardiovascular;  Laterality: N/A;    OB History    Gravida Para Term Preterm AB Living   6 5 5     5    SAB TAB Ectopic Multiple Live Births         0 5      Allergies   Allergen Reactions  . Latex Dermatitis    Social History   Social History  . Marital status: Married    Spouse name: N/A  . Number of children: N/A  . Years of education: N/A   Social History Main Topics  . Smoking status: Never Smoker  . Smokeless tobacco: Never Used  . Alcohol use No  . Drug use: No  . Sexual activity: Yes    Birth control/ protection: None   Other Topics Concern  . None   Social History Narrative  . None    Family History  Problem Relation Age of Onset  . Hypertension Mother   . Hyperlipidemia Mother   . Multiple sclerosis Mother   . COPD Mother   . Cancer Mother     melanoma x 2  . Diabetes Mother   . Heart disease Father     PSVT  . Hyperlipidemia Brother   . Hypertension Brother   . Heart disease Maternal Grandfather     heart attack  . Cancer Paternal Grandmother     pancreatic cancer  . Heart disease Paternal Grandmother     MI  . Hyperlipidemia Paternal Grandmother   . Hypertension Paternal Grandmother   . Asthma Daughter   . Asthma Son   . Autism Son   . ADD / ADHD Son   . Tourette syndrome Son   . Other Son     Neurological and mental issues  . ADD / ADHD Son

## 2017-01-22 ENCOUNTER — Other Ambulatory Visit: Payer: Self-pay | Admitting: Obstetrics & Gynecology

## 2017-01-22 DIAGNOSIS — Z3682 Encounter for antenatal screening for nuchal translucency: Secondary | ICD-10-CM

## 2017-01-22 DIAGNOSIS — N814 Uterovaginal prolapse, unspecified: Secondary | ICD-10-CM

## 2017-01-22 DIAGNOSIS — I471 Supraventricular tachycardia: Secondary | ICD-10-CM

## 2017-01-23 ENCOUNTER — Encounter: Payer: 59 | Admitting: Women's Health

## 2017-01-23 ENCOUNTER — Other Ambulatory Visit: Payer: 59

## 2017-02-13 ENCOUNTER — Ambulatory Visit (INDEPENDENT_AMBULATORY_CARE_PROVIDER_SITE_OTHER): Payer: 59 | Admitting: Women's Health

## 2017-02-13 ENCOUNTER — Encounter: Payer: Self-pay | Admitting: Women's Health

## 2017-02-13 ENCOUNTER — Other Ambulatory Visit (HOSPITAL_COMMUNITY)
Admission: RE | Admit: 2017-02-13 | Discharge: 2017-02-13 | Disposition: A | Discharge status: Home / Self Care | Payer: 59 | Source: Ambulatory Visit | Attending: Obstetrics & Gynecology | Admitting: Obstetrics & Gynecology

## 2017-02-13 VITALS — BP 104/58 | HR 64 | Ht 64.0 in | Wt 165.0 lb

## 2017-02-13 DIAGNOSIS — Z3A15 15 weeks gestation of pregnancy: Secondary | ICD-10-CM

## 2017-02-13 DIAGNOSIS — Z113 Encounter for screening for infections with a predominantly sexual mode of transmission: Secondary | ICD-10-CM | POA: Diagnosis present

## 2017-02-13 DIAGNOSIS — Z363 Encounter for antenatal screening for malformations: Secondary | ICD-10-CM

## 2017-02-13 DIAGNOSIS — Z1151 Encounter for screening for human papillomavirus (HPV): Secondary | ICD-10-CM | POA: Diagnosis present

## 2017-02-13 DIAGNOSIS — Z124 Encounter for screening for malignant neoplasm of cervix: Secondary | ICD-10-CM

## 2017-02-13 DIAGNOSIS — N814 Uterovaginal prolapse, unspecified: Secondary | ICD-10-CM | POA: Insufficient documentation

## 2017-02-13 DIAGNOSIS — Z331 Pregnant state, incidental: Secondary | ICD-10-CM

## 2017-02-13 DIAGNOSIS — Z23 Encounter for immunization: Secondary | ICD-10-CM

## 2017-02-13 DIAGNOSIS — O26899 Other specified pregnancy related conditions, unspecified trimester: Secondary | ICD-10-CM

## 2017-02-13 DIAGNOSIS — Z01419 Encounter for gynecological examination (general) (routine) without abnormal findings: Secondary | ICD-10-CM | POA: Diagnosis present

## 2017-02-13 DIAGNOSIS — Z3482 Encounter for supervision of other normal pregnancy, second trimester: Secondary | ICD-10-CM

## 2017-02-13 DIAGNOSIS — Z1389 Encounter for screening for other disorder: Secondary | ICD-10-CM

## 2017-02-13 DIAGNOSIS — Z6791 Unspecified blood type, Rh negative: Secondary | ICD-10-CM

## 2017-02-13 DIAGNOSIS — O09892 Supervision of other high risk pregnancies, second trimester: Secondary | ICD-10-CM

## 2017-02-13 DIAGNOSIS — Z87440 Personal history of urinary (tract) infections: Secondary | ICD-10-CM

## 2017-02-13 DIAGNOSIS — O34522 Maternal care for prolapse of gravid uterus, second trimester: Secondary | ICD-10-CM

## 2017-02-13 LAB — POCT URINALYSIS DIPSTICK
Glucose, UA: NEGATIVE
Ketones, UA: NEGATIVE
Leukocytes, UA: NEGATIVE
Nitrite, UA: NEGATIVE
PROTEIN UA: NEGATIVE
RBC UA: NEGATIVE

## 2017-02-13 NOTE — Patient Instructions (Signed)
Nausea & Vomiting  Have saltine crackers or pretzels by your bed and eat a few bites before you raise your head out of bed in the morning  Eat small frequent meals throughout the day instead of large meals  Drink plenty of fluids throughout the day to stay hydrated, just don't drink a lot of fluids with your meals.  This can make your stomach fill up faster making you feel sick  Do not brush your teeth right after you eat  Products with real ginger are good for nausea, like ginger ale and ginger hard candy Make sure it says made with real ginger!  Sucking on sour candy like lemon heads is also good for nausea  If your prenatal vitamins make you nauseated, take them at night so you will sleep through the nausea  Sea Bands  If you feel like you need medicine for the nausea & vomiting please let us know  If you are unable to keep any fluids or food down please let us know   Constipation  Drink plenty of fluid, preferably water, throughout the day  Eat foods high in fiber such as fruits, vegetables, and grains  Exercise, such as walking, is a good way to keep your bowels regular  Drink warm fluids, especially warm prune juice, or decaf coffee  Eat a 1/2 cup of real oatmeal (not instant), 1/2 cup applesauce, and 1/2-1 cup warm prune juice every day  If needed, you may take Colace (docusate sodium) stool softener once or twice a day to help keep the stool soft. If you are pregnant, wait until you are out of your first trimester (12-14 weeks of pregnancy)  If you still are having problems with constipation, you may take Miralax once daily as needed to help keep your bowels regular.  If you are pregnant, wait until you are out of your first trimester (12-14 weeks of pregnancy)    Second Trimester of Pregnancy The second trimester is from week 13 through week 28 (months 4 through 6). The second trimester is often a time when you feel your best. Your body has also adjusted to being  pregnant, and you begin to feel better physically. Usually, morning sickness has lessened or quit completely, you may have more energy, and you may have an increase in appetite. The second trimester is also a time when the fetus is growing rapidly. At the end of the sixth month, the fetus is about 9 inches long and weighs about 1 pounds. You will likely begin to feel the baby move (quickening) between 18 and 20 weeks of the pregnancy. Body changes during your second trimester Your body continues to go through many changes during your second trimester. The changes vary from woman to woman.  Your weight will continue to increase. You will notice your lower abdomen bulging out.  You may begin to get stretch marks on your hips, abdomen, and breasts.  You may develop headaches that can be relieved by medicines. The medicines should be approved by your health care provider.  You may urinate more often because the fetus is pressing on your bladder.  You may develop or continue to have heartburn as a result of your pregnancy.  You may develop constipation because certain hormones are causing the muscles that push waste through your intestines to slow down.  You may develop hemorrhoids or swollen, bulging veins (varicose veins).  You may have back pain. This is caused by:  Weight gain.  Pregnancy hormones that   are relaxing the joints in your pelvis.  A shift in weight and the muscles that support your balance.  Your breasts will continue to grow and they will continue to become tender.  Your gums may bleed and may be sensitive to brushing and flossing.  Dark spots or blotches (chloasma, mask of pregnancy) may develop on your face. This will likely fade after the baby is born.  A dark line from your belly button to the pubic area (linea nigra) may appear. This will likely fade after the baby is born.  You may have changes in your hair. These can include thickening of your hair, rapid growth,  and changes in texture. Some women also have hair loss during or after pregnancy, or hair that feels dry or thin. Your hair will most likely return to normal after your baby is born. What to expect at prenatal visits During a routine prenatal visit:  You will be weighed to make sure you and the fetus are growing normally.  Your blood pressure will be taken.  Your abdomen will be measured to track your baby's growth.  The fetal heartbeat will be listened to.  Any test results from the previous visit will be discussed. Your health care provider may ask you:  How you are feeling.  If you are feeling the baby move.  If you have had any abnormal symptoms, such as leaking fluid, bleeding, severe headaches, or abdominal cramping.  If you are using any tobacco products, including cigarettes, chewing tobacco, and electronic cigarettes.  If you have any questions. Other tests that may be performed during your second trimester include:  Blood tests that check for:  Low iron levels (anemia).  Gestational diabetes (between 24 and 28 weeks).  Rh antibodies. This is to check for a protein on red blood cells (Rh factor).  Urine tests to check for infections, diabetes, or protein in the urine.  An ultrasound to confirm the proper growth and development of the baby.  An amniocentesis to check for possible genetic problems.  Fetal screens for spina bifida and Down syndrome.  HIV (human immunodeficiency virus) testing. Routine prenatal testing includes screening for HIV, unless you choose not to have this test. Follow these instructions at home: Eating and drinking  Continue to eat regular, healthy meals.  Avoid raw meat, uncooked cheese, cat litter boxes, and soil used by cats. These carry germs that can cause birth defects in the baby.  Take your prenatal vitamins.  Take 1500-2000 mg of calcium daily starting at the 20th week of pregnancy until you deliver your baby.  If you  develop constipation:  Take over-the-counter or prescription medicines.  Drink enough fluid to keep your urine clear or pale yellow.  Eat foods that are high in fiber, such as fresh fruits and vegetables, whole grains, and beans.  Limit foods that are high in fat and processed sugars, such as fried and sweet foods. Activity  Exercise only as directed by your health care provider. Experiencing uterine cramps is a good sign to stop exercising.  Avoid heavy lifting, wear low heel shoes, and practice good posture.  Wear your seat belt at all times when driving.  Rest with your legs elevated if you have leg cramps or low back pain.  Wear a good support bra for breast tenderness.  Do not use hot tubs, steam rooms, or saunas. Lifestyle  Avoid all smoking, herbs, alcohol, and unprescribed drugs. These chemicals affect the formation and growth of the baby.    Do not use any products that contain nicotine or tobacco, such as cigarettes and e-cigarettes. If you need help quitting, ask your health care provider.  A sexual relationship may be continued unless your health care provider directs you otherwise. General instructions  Follow your health care provider's instructions regarding medicine use. There are medicines that are either safe or unsafe to take during pregnancy.  Take warm sitz baths to soothe any pain or discomfort caused by hemorrhoids. Use hemorrhoid cream if your health care provider approves.  If you develop varicose veins, wear support hose. Elevate your feet for 15 minutes, 3-4 times a day. Limit salt in your diet.  Visit your dentist if you have not gone yet during your pregnancy. Use a soft toothbrush to brush your teeth and be gentle when you floss.  Keep all follow-up prenatal visits as told by your health care provider. This is important. Contact a health care provider if:  You have dizziness.  You have mild pelvic cramps, pelvic pressure, or nagging pain in the  abdominal area.  You have persistent nausea, vomiting, or diarrhea.  You have a bad smelling vaginal discharge.  You have pain with urination. Get help right away if:  You have a fever.  You are leaking fluid from your vagina.  You have spotting or bleeding from your vagina.  You have severe abdominal cramping or pain.  You have rapid weight gain or weight loss.  You have shortness of breath with chest pain.  You notice sudden or extreme swelling of your face, hands, ankles, feet, or legs.  You have not felt your baby move in over an hour.  You have severe headaches that do not go away with medicine.  You have vision changes. Summary  The second trimester is from week 13 through week 28 (months 4 through 6). It is also a time when the fetus is growing rapidly.  Your body goes through many changes during pregnancy. The changes vary from woman to woman.  Avoid all smoking, herbs, alcohol, and unprescribed drugs. These chemicals affect the formation and growth your baby.  Do not use any tobacco products, such as cigarettes, chewing tobacco, and e-cigarettes. If you need help quitting, ask your health care provider.  Contact your health care provider if you have any questions. Keep all prenatal visits as told by your health care provider. This is important. This information is not intended to replace advice given to you by your health care provider. Make sure you discuss any questions you have with your health care provider. Document Released: 12/03/2001 Document Revised: 05/16/2016 Document Reviewed: 02/09/2013 Elsevier Interactive Patient Education  2017 [employer]vier Inc.  

## 2017-02-13 NOTE — Progress Notes (Signed)
Subjective:  Sharon Rowland is a 31 y.o. G51P5005 Caucasian female at [redacted]w[redacted]d by 9wk u/s, being seen today for her first obstetrical visit.  Pregnancy was unplanned, was trying to get surgery scheduled for ablation/pelvic floor repair d/t uterine and bladder prolapse, was using patches and thinks she must have gotten off somehow. 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  Pregnancy history fully reviewed.  Patient reports mild nausea/vomiting- declines meds. Denies vb, cramping, uti s/s, abnormal/malodorous vag d/c, or vulvovaginal itching/irritation.  BP (!) 104/58   Pulse 64   Wt 165 lb (74.8 kg)   LMP 10/23/2016 (Approximate)   BMI 29.23 kg/m   HISTORY: OB History  Gravida Para Term Preterm AB Living  6 5 5     5   SAB TAB Ectopic Multiple Live Births        0 5    # Outcome Date GA Lbr Len/2nd Weight Sex Delivery Anes PTL Lv  6 Current           5 Term 04/10/16 [redacted]w[redacted]d 11:20 / 00:30 9 lb 13 oz (4.451 kg) F Vag-Spont EPI N LIV  4 Term 06/18/14 [redacted]w[redacted]d 27:20 / 06:30 7 lb 6.9 oz (3.37 kg) M Vag-Spont EPI N LIV  3 Term 12/19/06 [redacted]w[redacted]d  7 lb 8 oz (3.402 kg) M Vag-Spont EPI N LIV     Birth Comments: forceps "Told not to have a baby bigger than this"  2 Term 10/02/05 [redacted]w[redacted]d  7 lb 2 oz (3.232 kg) M Vag-Spont EPI N LIV  1 Term 10/23/03 [redacted]w[redacted]d  6 lb 3 oz (2.807 kg) F Vag-Spont EPI N LIV     Birth Comments: PROM with septicemia (hospitalized for 1 week)     Past Medical History:  Diagnosis Date  . Allergic rhinitis   . Anemia   . Asthma    last used inhaler 1 month ago  . Female bladder prolapse   . GERD (gastroesophageal reflux disease)   . Pelvic pain in antepartum period in second trimester 01/17/2014   . Pregnant 08/01/2015  . PSVT (paroxysmal supraventricular tachycardia) (HCC)    could not be confirmed with 2016 study  . Rectocele   . Urethral prolapse   . Uterine prolapse   . Vaginal Pap smear, abnormal    Past Surgical History:  Procedure Laterality Date  . SUPRAVENTRICULAR TACHYCARDIA ABLATION  01/05/2015   unsuccessful  . SUPRAVENTRICULAR TACHYCARDIA ABLATION N/A 01/05/2015   Procedure: SUPRAVENTRICULAR TACHYCARDIA ABLATION;  Surgeon: Marinus Maw, MD;  Location: [facility] CATH LAB;  Service: Cardiovascular;  Laterality: N/A;   Family History  Problem Relation Age of Onset  . Hypertension Mother   . Hyperlipidemia Mother   . Multiple sclerosis Mother   . COPD Mother   . Cancer Mother     melanoma x 2  . Diabetes Mother   . Heart disease Father     PSVT  . Hyperlipidemia Brother   . Hypertension Brother   . Heart disease Maternal Grandfather     heart attack  . Cancer Paternal Grandmother     pancreatic cancer  . Heart disease Paternal Grandmother     MI  . Hyperlipidemia Paternal Grandmother   . Hypertension Paternal Grandmother   .  Asthma Daughter   . Asthma Son   . Autism Son   . ADD / ADHD Son   . Tourette syndrome Son   . Other Son     Neurological and mental issues  . ADD / ADHD Son     Exam   System:     General: Well developed & nourished, no acute distress   Skin: Warm & dry, normal coloration and turgor, no rashes   Neurologic: Alert & oriented, normal mood   Cardiovascular: Regular rate & rhythm   Respiratory: Effort & rate normal, LCTAB, acyanotic   Abdomen: Soft, non tender   Extremities: normal strength, tone   Pelvic Exam:    Perineum: Normal perineum   Vulva: Normal, no lesions   Vagina:  Normal mucosa, normal discharge   Cervix: Normal, bulbous, appears closed   Uterus: Normal size/shape/contour for GA  *uterus does not appear prolapsed today, cx very posterior*  Thin prep pap smear obtained by Shon Hale, NP student  FHR: 153 via  doppler   Assessment:   Pregnancy: [id number] Patient Active Problem List   Diagnosis Date Noted  . Supervision of normal pregnancy 09/05/2015    Priority: High  . Rh negative state in antepartum period 01/31/2014    Priority: High  . Abnormal Pap smear of cervix 01/31/2014    Priority: High  . Paroxysmal SVT (supraventricular tachycardia) (HCC) 11/09/2013    Priority: High  . Uterine prolapse 02/13/2017  . History of recurrent UTI (urinary tract infection) 02/27/2016  . Migraines 10/03/2015  . Trigger point of shoulder region 06/20/2015  . Asthma, stable 11/09/2013    [redacted]w[redacted]d G6P5005 New OB visit SVT Uterine/bladder prolapse Mild n/v  Plan:  Initial labs obtained Continue prenatal vitamins Problem list reviewed and updated Reviewed n/v relief measures and warning s/s to report Reviewed recommended weight gain based on pre-gravid BMI Encouraged well-balanced diet Genetic Screening discussed Quad Screen: declined Cystic fibrosis screening discussed declined Ultrasound discussed; fetal survey: requested Follow up in 4 weeks for and anatomy u/s CCNC completed (not sent, not applying for preg mcaid) Flu shot today  Marge Duncans CNM, [facility] 02/13/2017 9:53 AM

## 2017-02-14 LAB — URINALYSIS, ROUTINE W REFLEX MICROSCOPIC
BILIRUBIN UA: NEGATIVE
GLUCOSE, UA: NEGATIVE
KETONES UA: NEGATIVE
Leukocytes, UA: NEGATIVE
NITRITE UA: NEGATIVE
PROTEIN UA: NEGATIVE
RBC UA: NEGATIVE
SPEC GRAV UA: 1.008 (ref 1.005–1.030)
UUROB: 0.2 mg/dL (ref 0.2–1.0)
pH, UA: 7.5 (ref 5.0–7.5)

## 2017-02-14 LAB — CBC
Hematocrit: 36.1 % (ref 34.0–46.6)
Hemoglobin: 12.1 g/dL (ref 11.1–15.9)
MCH: 27.7 pg (ref 26.6–33.0)
MCHC: 33.5 g/dL (ref 31.5–35.7)
MCV: 83 fL (ref 79–97)
Platelets: 223 10*3/uL (ref 150–379)
RBC: 4.37 x10E6/uL (ref 3.77–5.28)
RDW: 15.7 % — ABNORMAL HIGH (ref 12.3–15.4)
WBC: 8.7 10*3/uL (ref 3.4–10.8)

## 2017-02-14 LAB — ABO/RH: Rh Factor: NEGATIVE

## 2017-02-14 LAB — PMP SCREEN PROFILE (10S), URINE
Amphetamine Screen, Ur: NEGATIVE ng/mL
BARBITURATE SCRN UR: POSITIVE ng/mL
Benzodiazepine Screen, Urine: NEGATIVE ng/mL
Cannabinoids Ur Ql Scn: NEGATIVE ng/mL
Cocaine(Metab.)Screen, Urine: NEGATIVE ng/mL
Creatinine(Crt), U: 55.6 mg/dL (ref 20.0–300.0)
METHADONE SCREEN, URINE: NEGATIVE ng/mL
Opiate Scrn, Ur: NEGATIVE ng/mL
Oxycodone+Oxymorphone Ur Ql Scn: NEGATIVE ng/mL
PCP SCRN UR: NEGATIVE ng/mL
PH UR, DRUG SCRN: 7.2 (ref 4.5–8.9)
Propoxyphene, Screen: NEGATIVE ng/mL

## 2017-02-14 LAB — RUBELLA SCREEN: Rubella Antibodies, IGG: 2.34 index (ref >0.99)

## 2017-02-14 LAB — HEPATITIS B SURFACE ANTIGEN: HEP B S AG: NEGATIVE

## 2017-02-14 LAB — RPR: RPR Ser Ql: NONREACTIVE

## 2017-02-14 LAB — CYTOLOGY - PAP
Chlamydia: NEGATIVE
Diagnosis: NEGATIVE
HPV: NOT DETECTED
Neisseria Gonorrhea: NEGATIVE

## 2017-02-14 LAB — HIV ANTIBODY (ROUTINE TESTING W REFLEX): HIV Screen 4th Generation wRfx: NONREACTIVE

## 2017-02-14 LAB — VARICELLA ZOSTER ANTIBODY, IGG: Varicella zoster IgG: 769 index (ref >165)

## 2017-02-14 LAB — ANTIBODY SCREEN: ANTIBODY SCREEN: NEGATIVE

## 2017-02-15 LAB — URINE CULTURE

## 2017-02-21 ENCOUNTER — Ambulatory Visit (INDEPENDENT_AMBULATORY_CARE_PROVIDER_SITE_OTHER): Payer: 59 | Admitting: Obstetrics & Gynecology

## 2017-02-21 ENCOUNTER — Telehealth: Payer: Self-pay | Admitting: *Deleted

## 2017-02-21 ENCOUNTER — Encounter: Payer: Self-pay | Admitting: Obstetrics & Gynecology

## 2017-02-21 VITALS — BP 100/60 | HR 78 | Temp 98.1°F | Wt 165.0 lb

## 2017-02-21 DIAGNOSIS — Z331 Pregnant state, incidental: Secondary | ICD-10-CM

## 2017-02-21 DIAGNOSIS — Z3A16 16 weeks gestation of pregnancy: Secondary | ICD-10-CM | POA: Diagnosis not present

## 2017-02-21 DIAGNOSIS — J40 Bronchitis, not specified as acute or chronic: Secondary | ICD-10-CM | POA: Diagnosis not present

## 2017-02-21 DIAGNOSIS — Z1389 Encounter for screening for other disorder: Secondary | ICD-10-CM

## 2017-02-21 DIAGNOSIS — D649 Anemia, unspecified: Secondary | ICD-10-CM | POA: Diagnosis not present

## 2017-02-21 LAB — POCT HEMOGLOBIN: Hemoglobin: 11 g/dL — AB (ref 12.2–16.2)

## 2017-02-21 LAB — POCT URINALYSIS DIPSTICK
Blood, UA: NEGATIVE
Glucose, UA: NEGATIVE
KETONES UA: NEGATIVE
LEUKOCYTES UA: NEGATIVE
Nitrite, UA: NEGATIVE
Protein, UA: NEGATIVE

## 2017-02-21 MED ORDER — AZITHROMYCIN 250 MG PO TABS: ORAL_TABLET | ORAL | 0 refills | Status: DC

## 2017-02-21 MED ORDER — HYDROCOD POLST-CPM POLST ER 10-8 MG/5ML PO SUER: 5.0000 mL | Freq: Two times a day (BID) | ORAL | 0 refills | Status: DC | PRN

## 2017-02-21 MED ORDER — PREDNISONE 10 MG PO TABS: 10.0000 mg | ORAL_TABLET | Freq: Every day | ORAL | 0 refills | Status: DC

## 2017-02-21 NOTE — Progress Notes (Signed)
Ob work in visit       Chief Complaint  Patient presents with  . work-in- ob    cold/ coughing x 1 month. want time out of work    Blood pressure 100/60, pulse 78, temperature 98.1 F (36.7 C), weight 165 lb (74.8 kg), last menstrual period 10/23/2016, currently breastfeeding.  31 y.o. [id number] Patient's last menstrual period was 10/23/2016 (approximate). [redacted]w[redacted]d   Outpatient Encounter Prescriptions as of 02/21/2017  Medication Sig  . metoprolol tartrate (LOPRESSOR) 25 MG tablet Take 1 daily  . Prenatal Vit-Fe Fumarate-FA (MULTIVITAMIN-PRENATAL) 27-0.8 MG TABS tablet Take 1 tablet by mouth daily at 12 noon.  Marland Kitchen albuterol (PROVENTIL HFA;VENTOLIN HFA) 108 (90 Base) MCG/ACT inhaler Inhale into the lungs every 6 (six) hours as needed for wheezing or shortness of breath.  Marland Kitchen azithromycin (ZITHROMAX Z-PAK) 250 MG tablet Take 2 tablets today and then 1 a day til finished  . butalbital-acetaminophen-caffeine (FIORICET, ESGIC) 50-325-40 MG tablet Take by mouth 2 (two) times daily as needed for headache.  . chlorpheniramine-HYDROcodone (TUSSIONEX PENNKINETIC ER) 10-8 MG/5ML SUER Take 5 mLs by mouth every 12 (twelve) hours as needed for cough.  . predniSONE (DELTASONE) 10 MG tablet Take 1 tablet (10 mg total) by mouth daily with breakfast.   No facility-administered encounter medications on file as of 02/21/2017.     Subjective [redacted]w[redacted]d Estimated Date of Delivery: 08/06/17 with really a month of being "sick"  Had GI issues x 2  Then cough cold symptoms which have lingered  Denies any significant fever cough is non produtive She has done 4-5 nebulizer treatments in the past week  Objective FHR 165 Lungs upper airway bronchitic changes no consolidation No wheezes  Pertinent ROS No fever chills  Labs or studies     Impression Diagnoses this Encounter::   ICD-9-CM ICD-10-CM   1. Bronchitis 490 J40   2. Pregnant state, incidental V22.2 Z33.1 POCT urinalysis dipstick  3. Screening for  genitourinary condition V81.6 Z13.89 POCT urinalysis dipstick    Established relevant diagnosis(es):   Plan/Recommendations: Meds ordered this encounter  Medications  . azithromycin (ZITHROMAX Z-PAK) 250 MG tablet    Sig: Take 2 tablets today and then 1 a day til finished    Dispense:  6 each    Refill:  0  . predniSONE (DELTASONE) 10 MG tablet    Sig: Take 1 tablet (10 mg total) by mouth daily with breakfast.    Dispense:  40 tablet    Refill:  0  . chlorpheniramine-HYDROcodone (TUSSIONEX PENNKINETIC ER) 10-8 MG/5ML SUER    Sig: Take 5 mLs by mouth every 12 (twelve) hours as needed for cough.    Dispense:  140 mL    Refill:  0    Labs or Scans Ordered: Orders Placed This Encounter  Procedures  . POCT urinalysis dipstick    Management:: z pak + prednisone 40 mg x 10 days  Follow up Return in about 1 week (around 02/28/2017) for Follow up, with Dr Despina Hidden.     All questions were answered.  Past Medical History:  Diagnosis Date  . Allergic rhinitis   . Anemia   . Asthma    last used inhaler 1 month ago  . Female bladder prolapse   . GERD (gastroesophageal reflux disease)   . Pelvic pain in antepartum period in second trimester 01/17/2014  . Pregnant 08/01/2015  . PSVT (paroxysmal supraventricular tachycardia) (HCC)    could not be confirmed with 2016 study  . Rectocele   .  Urethral prolapse   . Uterine prolapse   . Vaginal Pap smear, abnormal     Past Surgical History:  Procedure Laterality Date  . SUPRAVENTRICULAR TACHYCARDIA ABLATION  01/05/2015   unsuccessful  . SUPRAVENTRICULAR TACHYCARDIA ABLATION N/A 01/05/2015   Procedure: SUPRAVENTRICULAR TACHYCARDIA ABLATION;  Surgeon: Marinus Maw, MD;  Location: [facility] CATH LAB;  Service: Cardiovascular;  Laterality: N/A;    OB History    Gravida Para Term Preterm AB Living   6 5 5     5    SAB TAB Ectopic Multiple Live Births         0 5      Allergies  Allergen Reactions  . Latex Dermatitis    Social History    Social History  . Marital status: Married    Spouse name: N/A  . Number of children: N/A  . Years of education: N/A   Social History Main Topics  . Smoking status: Never Smoker  . Smokeless tobacco: Never Used  . Alcohol use No  . Drug use: No  . Sexual activity: Yes    Birth control/ protection: None   Other Topics Concern  . None   Social History Narrative  . None    Family History  Problem Relation Age of Onset  . Hypertension Mother   . Hyperlipidemia Mother   . Multiple sclerosis Mother   . COPD Mother   . Cancer Mother     melanoma x 2  . Diabetes Mother   . Heart disease Father     PSVT  . Hyperlipidemia Brother   . Hypertension Brother   . Heart disease Maternal Grandfather     heart attack  . Cancer Paternal Grandmother     pancreatic cancer  . Heart disease Paternal Grandmother     MI  . Hyperlipidemia Paternal Grandmother   . Hypertension Paternal Grandmother   . Asthma Daughter   . Asthma Son   . Autism Son   . ADD / ADHD Son   . Tourette syndrome Son   . Other Son     Neurological and mental issues  . ADD / ADHD Son

## 2017-02-21 NOTE — Telephone Encounter (Signed)
[facility] pharmacy called stating prescriptions were sent via mail order for Azithromycin and Prednisone. Verbal order given for both.

## 2017-02-21 NOTE — Addendum Note (Signed)
Addended by: Federico Flake A on: 02/21/2017 11:27 AM   Modules accepted: Orders

## 2017-02-28 ENCOUNTER — Ambulatory Visit (INDEPENDENT_AMBULATORY_CARE_PROVIDER_SITE_OTHER): Payer: 59 | Admitting: Obstetrics & Gynecology

## 2017-02-28 ENCOUNTER — Encounter: Payer: Self-pay | Admitting: Obstetrics & Gynecology

## 2017-02-28 VITALS — BP 100/60 | HR 70 | Wt 164.5 lb

## 2017-02-28 DIAGNOSIS — Z1389 Encounter for screening for other disorder: Secondary | ICD-10-CM

## 2017-02-28 DIAGNOSIS — Z3A17 17 weeks gestation of pregnancy: Secondary | ICD-10-CM

## 2017-02-28 DIAGNOSIS — Z331 Pregnant state, incidental: Secondary | ICD-10-CM

## 2017-02-28 DIAGNOSIS — Z3482 Encounter for supervision of other normal pregnancy, second trimester: Secondary | ICD-10-CM

## 2017-02-28 DIAGNOSIS — L03019 Cellulitis of unspecified finger: Secondary | ICD-10-CM

## 2017-02-28 LAB — POCT URINALYSIS DIPSTICK
GLUCOSE UA: NEGATIVE
Ketones, UA: NEGATIVE
Leukocytes, UA: NEGATIVE
NITRITE UA: NEGATIVE
Protein, UA: NEGATIVE
RBC UA: NEGATIVE

## 2017-02-28 MED ORDER — SILVER SULFADIAZINE 1 % EX CREA: TOPICAL_CREAM | CUTANEOUS | 11 refills | Status: DC

## 2017-02-28 NOTE — Progress Notes (Signed)
[id number] [redacted]w[redacted]d Estimated Date of Delivery: 08/06/17  Blood pressure 100/60, pulse 70, weight 164 lb 8 oz (74.6 kg), last menstrual period 10/23/2016, currently breastfeeding.   BP weight and urine results all reviewed and noted.  Please refer to the obstetrical flow sheet for the fundal height and fetal heart rate documentation:  Patient reports good fetal movement, denies any bleeding and no rupture of membranes symptoms or regular contractions. Patient is without complaints. All questions were answered.  Orders Placed This Encounter  Procedures  . POCT Urinalysis Dipstick    Plan:  Continued routine obstetrical care, pt feels "significantly" better than last week  Has perionychitis  Return for keep scheduled.

## 2017-03-05 ENCOUNTER — Encounter: Payer: Self-pay | Admitting: Obstetrics & Gynecology

## 2017-03-06 ENCOUNTER — Encounter: Payer: 59 | Admitting: Women's Health

## 2017-03-08 ENCOUNTER — Telehealth: Payer: 59 | Admitting: Physician Assistant

## 2017-03-08 DIAGNOSIS — N390 Urinary tract infection, site not specified: Secondary | ICD-10-CM

## 2017-03-08 NOTE — Progress Notes (Signed)
For the safety of you and your child, I recommend a face to face office visit with a health care provider.   Many mothers need to take medicines during their pregnancy and while nursing.  Almost all medicines pass into the breast milk in small quantities.  Most are generally considered safe for a mother to take but some medicines must be avoided.  After reviewing your E-Visit request, I recommend that you consult your OB/GYN or pediatrician for medical advice in relation to your condition and prescription medications while pregnant or breastfeeding.  Urinary tract infections in pregnant women are considered complicated. A detailed examination and urine testing are needed to determine appropriate treatment. You need to contact you OB/GYN office to speak to the provider on call or please be seen at an Urgent Care today. If you are having a true medical emergency please call 911.  If you need an urgent face to face visit, [place] has four urgent care centers for your convenience.  If you need care fast and have a high deductible or no insurance consider:   [email or web address]  [phone]  [address] [place], [place] [address] 8 am to 8 pm Monday-Friday 10 am to 4 pm Saturday-Sunday   The following sites will take your  insurance:    . [facility]  [phone] Get Driving Directions Find a Provider at this Location  [address] [place], [place] [address] . 10 am to 8 pm Monday-Friday . 12 pm to 8 pm Saturday-Sunday   . [facility] at [facility]  [phone] Get Driving Directions Find a Provider at this Location  [address] [place], [place] [address] . 8 am to 8 pm Monday-Friday . 9 am to 6 pm Saturday . 11 am to 6 pm Sunday   . [facility] at [facility]  [phone] Get Driving Directions  [address] [place], [place] [address] . 8 am to 8 pm  Monday-Friday . 8 am to 4 pm Saturday-Sunday   Your e-visit answers were reviewed by a board certified advanced clinical practitioner to complete your personal care plan.  Thank you for using e-Visits.

## 2017-03-17 ENCOUNTER — Ambulatory Visit (INDEPENDENT_AMBULATORY_CARE_PROVIDER_SITE_OTHER): Payer: 59 | Admitting: Women's Health

## 2017-03-17 ENCOUNTER — Ambulatory Visit (INDEPENDENT_AMBULATORY_CARE_PROVIDER_SITE_OTHER): Payer: 59

## 2017-03-17 ENCOUNTER — Encounter: Payer: Self-pay | Admitting: Women's Health

## 2017-03-17 VITALS — BP 96/56 | HR 60 | Wt 163.0 lb

## 2017-03-17 DIAGNOSIS — Z363 Encounter for antenatal screening for malformations: Secondary | ICD-10-CM

## 2017-03-17 DIAGNOSIS — Z1389 Encounter for screening for other disorder: Secondary | ICD-10-CM

## 2017-03-17 DIAGNOSIS — Z3482 Encounter for supervision of other normal pregnancy, second trimester: Secondary | ICD-10-CM

## 2017-03-17 DIAGNOSIS — Z331 Pregnant state, incidental: Secondary | ICD-10-CM

## 2017-03-17 LAB — POCT URINALYSIS DIPSTICK
Blood, UA: NEGATIVE
Glucose, UA: NEGATIVE
LEUKOCYTES UA: NEGATIVE
NITRITE UA: NEGATIVE
PROTEIN UA: NEGATIVE

## 2017-03-17 NOTE — Progress Notes (Signed)
[place] 19+5 wks,cephalic,cx 3.9 cm,normal ov's bilat,post pl gr 0,fhr 162 bpm,svp of fluid 4.8 cm,efw 325 g,anatomy complete,no obvious abnormalities seen

## 2017-03-17 NOTE — Progress Notes (Signed)
Low-risk OB appointment [id number] [redacted]w[redacted]d Estimated Date of Delivery: 08/06/17 BP (!) 96/56   Pulse 60   Wt 163 lb (73.9 kg)   LMP 10/23/2016 (Approximate)   BMI 27.98 kg/m   BP, weight, and urine reviewed.  Refer to obstetrical flow sheet for FH & FHR.  Reports good fm.  Denies regular uc's, lof, vb, or uti s/s. Recent uti, still having low Rt side back pain radiating around to groin, pretty sure she has a kidney stone, has had in past, usually able to pass them w/o difficulty. Urine dipstick today sm ketones, to push plenty of fluid, call back if not improving, may try flomax. Still having nausea, no vomiting, but not able to eat well. Declines nausea meds.  Reviewed today's normal anatomy u/s, warning s/s to report. Plan:  Continue routine obstetrical care  F/U in 4wks for OB appointment

## 2017-03-17 NOTE — Patient Instructions (Signed)

## 2017-04-17 ENCOUNTER — Telehealth: Payer: Self-pay | Admitting: *Deleted

## 2017-04-17 MED ORDER — ONDANSETRON HCL 4 MG PO TABS: 4.0000 mg | ORAL_TABLET | Freq: Three times a day (TID) | ORAL | 0 refills | Status: DC | PRN

## 2017-04-17 NOTE — Telephone Encounter (Signed)
Verbal order called to [facility] for Zofran 4mg  tablets per Joellyn Haff order

## 2017-04-18 ENCOUNTER — Encounter: Payer: Self-pay | Admitting: Women's Health

## 2017-04-18 ENCOUNTER — Ambulatory Visit (INDEPENDENT_AMBULATORY_CARE_PROVIDER_SITE_OTHER): Payer: 59 | Admitting: Women's Health

## 2017-04-18 VITALS — BP 102/60 | HR 94 | Wt 169.0 lb

## 2017-04-18 DIAGNOSIS — Z331 Pregnant state, incidental: Secondary | ICD-10-CM

## 2017-04-18 DIAGNOSIS — Z1389 Encounter for screening for other disorder: Secondary | ICD-10-CM

## 2017-04-18 DIAGNOSIS — Z3482 Encounter for supervision of other normal pregnancy, second trimester: Secondary | ICD-10-CM

## 2017-04-18 LAB — POCT URINALYSIS DIPSTICK
Blood, UA: NEGATIVE
GLUCOSE UA: NEGATIVE
Ketones, UA: NEGATIVE
Leukocytes, UA: NEGATIVE
NITRITE UA: NEGATIVE
Protein, UA: NEGATIVE

## 2017-04-18 MED ORDER — METOPROLOL TARTRATE 50 MG PO TABS: ORAL_TABLET | ORAL | 3 refills | Status: DC

## 2017-04-18 NOTE — Progress Notes (Addendum)
Low-risk OB appointment [id number] [redacted]w[redacted]d Estimated Date of Delivery: 08/06/17 BP 102/60   Pulse 94   Wt 169 lb (76.7 kg)   LMP 10/23/2016 (Approximate)   BMI 29.01 kg/m   BP, weight, and urine reviewed.  Refer to obstetrical flow sheet for FH & FHR.  Reports good fm.  Denies regular uc's, lof, vb, or uti s/s. Feels she needs to increase metoprolol to 50mg  daily from 25mg  daily for SVT, having more palpitations/sob. New rx sent.  Refuses 2hr gtt, has never done during any other pregnancy. Occ checks blood sugar at work, is never high. Did have 10lb baby last time. Recommended checking sugars QID x 1-2wks- gave parameters. To let us know if having abnormals. Understands potential risks of undiagnosed GDM.  Reviewed ptl s/s, fm. Plan:  Continue routine obstetrical care  F/U in 4wks for OB appointment and pn2 (minus gtt- pt refuses)

## 2017-04-18 NOTE — Patient Instructions (Addendum)
Check blood sugars 4 times a day: in the morning before eating/drinking anything (<90) and 2 hours after eating breakfast, lunch, and supper (<120).  Let us know if you are having any abnormals  Call the office [phone]) or go to [facility] if:  You begin to have strong, frequent contractions  Your water breaks.  Sometimes it is a big gush of fluid, sometimes it is just a trickle that keeps getting your panties wet or running down your legs  You have vaginal bleeding.  It is normal to have a small amount of spotting if your cervix was checked.   You don't feel your baby moving like normal.  If you don't, get you something to eat and drink and lay down and focus on feeling your baby move.   If your baby is still not moving like normal, you should call the office or go to [facility].  Second Trimester of Pregnancy The second trimester is from week 13 through week 28, months 4 through 6. The second trimester is often a time when you feel your best. Your body has also adjusted to being pregnant, and you begin to feel better physically. Usually, morning sickness has lessened or quit completely, you may have more energy, and you may have an increase in appetite. The second trimester is also a time when the fetus is growing rapidly. At the end of the sixth month, the fetus is about 9 inches long and weighs about 1 pounds. You will likely begin to feel the baby move (quickening) between 18 and 20 weeks of the pregnancy. BODY CHANGES Your body goes through many changes during pregnancy. The changes vary from woman to woman.   Your weight will continue to increase. You will notice your lower abdomen bulging out.  You may begin to get stretch marks on your hips, abdomen, and breasts.  You may develop headaches that can be relieved by medicines approved by your health care provider.  You may urinate more often because the fetus is pressing on your bladder.  You may develop or continue to  have heartburn as a result of your pregnancy.  You may develop constipation because certain hormones are causing the muscles that push waste through your intestines to slow down.  You may develop hemorrhoids or swollen, bulging veins (varicose veins).  You may have back pain because of the weight gain and pregnancy hormones relaxing your joints between the bones in your pelvis and as a result of a shift in weight and the muscles that support your balance.  Your breasts will continue to grow and be tender.  Your gums may bleed and may be sensitive to brushing and flossing.  Dark spots or blotches (chloasma, mask of pregnancy) may develop on your face. This will likely fade after the baby is born.  A dark line from your belly button to the pubic area (linea nigra) may appear. This will likely fade after the baby is born.  You may have changes in your hair. These can include thickening of your hair, rapid growth, and changes in texture. Some women also have hair loss during or after pregnancy, or hair that feels dry or thin. Your hair will most likely return to normal after your baby is born. WHAT TO EXPECT AT YOUR PRENATAL VISITS During a routine prenatal visit:  You will be weighed to make sure you and the fetus are growing normally.  Your blood pressure will be taken.  Your abdomen will be measured  to track your baby's growth.  The fetal heartbeat will be listened to.  Any test results from the previous visit will be discussed. Your health care provider may ask you:  How you are feeling.  If you are feeling the baby move.  If you have had any abnormal symptoms, such as leaking fluid, bleeding, severe headaches, or abdominal cramping.  If you have any questions. Other tests that may be performed during your second trimester include:  Blood tests that check for:  Low iron levels (anemia).  Gestational diabetes (between 24 and 28 weeks).  Rh antibodies.  Urine tests to  check for infections, diabetes, or protein in the urine.  An ultrasound to confirm the proper growth and development of the baby.  An amniocentesis to check for possible genetic problems.  Fetal screens for spina bifida and Down syndrome. HOME CARE INSTRUCTIONS   Avoid all smoking, herbs, alcohol, and unprescribed drugs. These chemicals affect the formation and growth of the baby.  Follow your health care provider's instructions regarding medicine use. There are medicines that are either safe or unsafe to take during pregnancy.  Exercise only as directed by your health care provider. Experiencing uterine cramps is a good sign to stop exercising.  Continue to eat regular, healthy meals.  Wear a good support bra for breast tenderness.  Do not use hot tubs, steam rooms, or saunas.  Wear your seat belt at all times when driving.  Avoid raw meat, uncooked cheese, cat litter boxes, and soil used by cats. These carry germs that can cause birth defects in the baby.  Take your prenatal vitamins.  Try taking a stool softener (if your health care provider approves) if you develop constipation. Eat more high-fiber foods, such as fresh vegetables or fruit and whole grains. Drink plenty of fluids to keep your urine clear or pale yellow.  Take warm sitz baths to soothe any pain or discomfort caused by hemorrhoids. Use hemorrhoid cream if your health care provider approves.  If you develop varicose veins, wear support hose. Elevate your feet for 15 minutes, 3-4 times a day. Limit salt in your diet.  Avoid heavy lifting, wear low heel shoes, and practice good posture.  Rest with your legs elevated if you have leg cramps or low back pain.  Visit your dentist if you have not gone yet during your pregnancy. Use a soft toothbrush to brush your teeth and be gentle when you floss.  A sexual relationship may be continued unless your health care provider directs you otherwise.  Continue to go to all  your prenatal visits as directed by your health care provider. SEEK MEDICAL CARE IF:   You have dizziness.  You have mild pelvic cramps, pelvic pressure, or nagging pain in the abdominal area.  You have persistent nausea, vomiting, or diarrhea.  You have a bad smelling vaginal discharge.  You have pain with urination. SEEK IMMEDIATE MEDICAL CARE IF:   You have a fever.  You are leaking fluid from your vagina.  You have spotting or bleeding from your vagina.  You have severe abdominal cramping or pain.  You have rapid weight gain or loss.  You have shortness of breath with chest pain.  You notice sudden or extreme swelling of your face, hands, ankles, feet, or legs.  You have not felt your baby move in over an hour.  You have severe headaches that do not go away with medicine.  You have vision changes. Document Released: 12/03/2001 Document Revised:  12/14/2013 Document Reviewed: 02/09/2013 ExitCare Patient Information 2015 [place], [place]. This information is not intended to replace advice given to you by your health care provider. Make sure you discuss any questions you have with your health care provider.

## 2017-05-16 ENCOUNTER — Encounter: Payer: Self-pay | Admitting: Obstetrics & Gynecology

## 2017-05-16 ENCOUNTER — Ambulatory Visit (INDEPENDENT_AMBULATORY_CARE_PROVIDER_SITE_OTHER): Payer: Medicaid Other | Admitting: Obstetrics & Gynecology

## 2017-05-16 VITALS — BP 104/58 | HR 88 | Wt 174.5 lb

## 2017-05-16 DIAGNOSIS — Z331 Pregnant state, incidental: Secondary | ICD-10-CM

## 2017-05-16 DIAGNOSIS — Z1389 Encounter for screening for other disorder: Secondary | ICD-10-CM

## 2017-05-16 DIAGNOSIS — Z3482 Encounter for supervision of other normal pregnancy, second trimester: Secondary | ICD-10-CM

## 2017-05-16 DIAGNOSIS — Z3A28 28 weeks gestation of pregnancy: Secondary | ICD-10-CM | POA: Diagnosis not present

## 2017-05-16 LAB — POCT URINALYSIS DIPSTICK
Blood, UA: NEGATIVE
Glucose, UA: NEGATIVE
NITRITE UA: NEGATIVE
PROTEIN UA: NEGATIVE

## 2017-05-16 MED ORDER — METOPROLOL TARTRATE 50 MG PO TABS: ORAL_TABLET | ORAL | 3 refills | Status: DC

## 2017-05-16 MED ORDER — OMEPRAZOLE 20 MG PO CPDR: 20.0000 mg | DELAYED_RELEASE_CAPSULE | Freq: Every day | ORAL | 6 refills | Status: DC

## 2017-05-16 NOTE — Addendum Note (Signed)
Addended by: Tish Frederickson A on: 05/16/2017 10:29 AM   Modules accepted: Orders

## 2017-05-16 NOTE — Progress Notes (Signed)
[id number] [redacted]w[redacted]d Estimated Date of Delivery: 08/06/17  Blood pressure (!) 104/58, pulse 88, weight 174 lb 8 oz (79.2 kg), last menstrual period 10/23/2016, currently breastfeeding.   BP weight and urine results all reviewed and noted.  Please refer to the obstetrical flow sheet for the fundal height and fetal heart rate documentation:  Patient reports good fetal movement, denies any bleeding and no rupture of membranes symptoms or regular contractions. Patient is without complaints. All questions were answered.  Orders Placed This Encounter  Procedures  . POCT Urinalysis Dipstick    Plan:  Continued routine obstetrical care, having increasing issues with palpitations and chest pressure Add labetalol pm 25 mg and increase if needed also add omeprazole  Meds ordered this encounter  Medications  . metoprolol tartrate (LOPRESSOR) 50 MG tablet    Sig: Take 1 daily    Dispense:  60 tablet    Refill:  3  . omeprazole (PRILOSEC) 20 MG capsule    Sig: Take 1 capsule (20 mg total) by mouth daily. 1 tablet a day    Dispense:  30 capsule    Refill:  6     Return in about 3 weeks (around 06/06/2017) for LROB.

## 2017-05-17 LAB — CBC
Hematocrit: 31.1 % — ABNORMAL LOW (ref 34.0–46.6)
Hemoglobin: 10.5 g/dL — ABNORMAL LOW (ref 11.1–15.9)
MCH: 27.2 pg (ref 26.6–33.0)
MCHC: 33.8 g/dL (ref 31.5–35.7)
MCV: 81 fL (ref 79–97)
PLATELETS: 207 10*3/uL (ref 150–379)
RBC: 3.86 x10E6/uL (ref 3.77–5.28)
RDW: 15.1 % (ref 12.3–15.4)
WBC: 11.4 10*3/uL — ABNORMAL HIGH (ref 3.4–10.8)

## 2017-05-17 LAB — ANTIBODY SCREEN: Antibody Screen: NEGATIVE

## 2017-05-17 LAB — HIV ANTIBODY (ROUTINE TESTING W REFLEX): HIV Screen 4th Generation wRfx: NONREACTIVE

## 2017-05-17 LAB — RPR: RPR Ser Ql: NONREACTIVE

## 2017-06-06 ENCOUNTER — Encounter: Payer: Medicaid Other | Admitting: Women's Health

## 2017-06-25 ENCOUNTER — Encounter: Payer: Self-pay | Admitting: Obstetrics & Gynecology

## 2017-06-26 ENCOUNTER — Other Ambulatory Visit: Payer: Self-pay | Admitting: Obstetrics & Gynecology

## 2017-06-26 ENCOUNTER — Telehealth: Payer: Self-pay | Admitting: *Deleted

## 2017-06-26 MED ORDER — METOPROLOL TARTRATE 50 MG PO TABS: ORAL_TABLET | ORAL | 3 refills | Status: DC

## 2017-06-26 NOTE — Telephone Encounter (Signed)
done

## 2017-06-30 ENCOUNTER — Ambulatory Visit: Payer: Medicaid Other | Admitting: Women's Health

## 2017-08-10 ENCOUNTER — Inpatient Hospital Stay (HOSPITAL_COMMUNITY): Payer: Medicaid Other | Admitting: Anesthesiology

## 2017-08-10 ENCOUNTER — Inpatient Hospital Stay (HOSPITAL_COMMUNITY)
Admission: AD | Admit: 2017-08-10 | Discharge: 2017-08-12 | DRG: 774 | Disposition: A | Discharge status: Home / Self Care | Payer: Medicaid Other | Source: Ambulatory Visit | Attending: Obstetrics & Gynecology | Admitting: Obstetrics & Gynecology

## 2017-08-10 ENCOUNTER — Encounter (HOSPITAL_COMMUNITY): Payer: Self-pay | Admitting: *Deleted

## 2017-08-10 DIAGNOSIS — J45909 Unspecified asthma, uncomplicated: Secondary | ICD-10-CM | POA: Diagnosis present

## 2017-08-10 DIAGNOSIS — O9942 Diseases of the circulatory system complicating childbirth: Secondary | ICD-10-CM | POA: Diagnosis present

## 2017-08-10 DIAGNOSIS — Z3A40 40 weeks gestation of pregnancy: Secondary | ICD-10-CM

## 2017-08-10 DIAGNOSIS — Z91040 Latex allergy status: Secondary | ICD-10-CM

## 2017-08-10 DIAGNOSIS — O9952 Diseases of the respiratory system complicating childbirth: Secondary | ICD-10-CM | POA: Diagnosis present

## 2017-08-10 DIAGNOSIS — I471 Supraventricular tachycardia: Secondary | ICD-10-CM | POA: Diagnosis present

## 2017-08-10 DIAGNOSIS — Z3493 Encounter for supervision of normal pregnancy, unspecified, third trimester: Secondary | ICD-10-CM | POA: Diagnosis present

## 2017-08-10 DIAGNOSIS — Z6791 Unspecified blood type, Rh negative: Secondary | ICD-10-CM | POA: Diagnosis not present

## 2017-08-10 DIAGNOSIS — O26893 Other specified pregnancy related conditions, third trimester: Secondary | ICD-10-CM | POA: Diagnosis present

## 2017-08-10 LAB — CBC
HCT: 32.2 % — ABNORMAL LOW (ref 36.0–46.0)
Hemoglobin: 10.2 g/dL — ABNORMAL LOW (ref 12.0–15.0)
MCH: 23.7 pg — ABNORMAL LOW (ref 26.0–34.0)
MCHC: 31.7 g/dL (ref 30.0–36.0)
MCV: 74.7 fL — ABNORMAL LOW (ref 78.0–100.0)
PLATELETS: 203 10*3/uL (ref 150–400)
RBC: 4.31 MIL/uL (ref 3.87–5.11)
RDW: 17.5 % — ABNORMAL HIGH (ref 11.5–15.5)
WBC: 12.4 10*3/uL — AB (ref 4.0–10.5)

## 2017-08-10 LAB — TYPE AND SCREEN
ABO/RH(D): A NEG
ANTIBODY SCREEN: NEGATIVE

## 2017-08-10 LAB — GROUP B STREP BY PCR: Group B strep by PCR: NEGATIVE

## 2017-08-10 LAB — OB RESULTS CONSOLE GBS: GBS: NEGATIVE

## 2017-08-10 MED ORDER — LACTATED RINGERS IV SOLN
500.0000 mL | Freq: Once | INTRAVENOUS | Status: AC
  Administered 2017-08-10: 500 mL via INTRAVENOUS

## 2017-08-10 MED ORDER — OXYTOCIN BOLUS FROM INFUSION: 500.0000 mL | Freq: Once | INTRAVENOUS | Status: DC

## 2017-08-10 MED ORDER — EPHEDRINE 5 MG/ML INJ
10.0000 mg | INTRAVENOUS | Status: DC | PRN
  Filled 2017-08-10: qty 2

## 2017-08-10 MED ORDER — OXYTOCIN 40 UNITS IN LACTATED RINGERS INFUSION - SIMPLE MED
2.5000 [IU]/h | INTRAVENOUS | Status: DC
  Filled 2017-08-10: qty 1000

## 2017-08-10 MED ORDER — ACETAMINOPHEN 325 MG PO TABS: 650.0000 mg | ORAL_TABLET | ORAL | Status: DC | PRN

## 2017-08-10 MED ORDER — LACTATED RINGERS IV SOLN: 500.0000 mL | INTRAVENOUS | Status: DC | PRN

## 2017-08-10 MED ORDER — LIDOCAINE HCL (PF) 1 % IJ SOLN
30.0000 mL | INTRAMUSCULAR | Status: DC | PRN
  Filled 2017-08-10: qty 30

## 2017-08-10 MED ORDER — PENICILLIN G POTASSIUM 5000000 UNITS IJ SOLR
5.0000 10*6.[IU] | Freq: Once | INTRAMUSCULAR | Status: DC
  Filled 2017-08-10: qty 5

## 2017-08-10 MED ORDER — METOPROLOL TARTRATE 50 MG PO TABS
50.0000 mg | ORAL_TABLET | Freq: Every day | ORAL | Status: DC
  Administered 2017-08-11: 50 mg via ORAL
  Filled 2017-08-10: qty 1

## 2017-08-10 MED ORDER — OXYCODONE-ACETAMINOPHEN 5-325 MG PO TABS: 1.0000 | ORAL_TABLET | ORAL | Status: DC | PRN

## 2017-08-10 MED ORDER — FLEET ENEMA 7-19 GM/118ML RE ENEM: 1.0000 | ENEMA | RECTAL | Status: DC | PRN

## 2017-08-10 MED ORDER — LACTATED RINGERS IV SOLN
INTRAVENOUS | Status: DC
  Administered 2017-08-10: 22:00:00 via INTRAVENOUS

## 2017-08-10 MED ORDER — SOD CITRATE-CITRIC ACID 500-334 MG/5ML PO SOLN: 30.0000 mL | ORAL | Status: DC | PRN

## 2017-08-10 MED ORDER — PHENYLEPHRINE 40 MCG/ML (10ML) SYRINGE FOR IV PUSH (FOR BLOOD PRESSURE SUPPORT)
80.0000 ug | PREFILLED_SYRINGE | INTRAVENOUS | Status: DC | PRN
  Filled 2017-08-10: qty 10
  Filled 2017-08-10: qty 5

## 2017-08-10 MED ORDER — FENTANYL 2.5 MCG/ML BUPIVACAINE 1/10 % EPIDURAL INFUSION (WH - ANES)
14.0000 mL/h | INTRAMUSCULAR | Status: DC | PRN
  Administered 2017-08-10: 14 mL/h via EPIDURAL
  Filled 2017-08-10: qty 100

## 2017-08-10 MED ORDER — ONDANSETRON HCL 4 MG/2ML IJ SOLN: 4.0000 mg | Freq: Four times a day (QID) | INTRAMUSCULAR | Status: DC | PRN

## 2017-08-10 MED ORDER — FENTANYL CITRATE (PF) 100 MCG/2ML IJ SOLN
50.0000 ug | INTRAMUSCULAR | Status: DC | PRN
  Administered 2017-08-10: 100 ug via INTRAVENOUS
  Filled 2017-08-10: qty 2

## 2017-08-10 MED ORDER — ONDANSETRON HCL 4 MG PO TABS: 8.0000 mg | ORAL_TABLET | Freq: Three times a day (TID) | ORAL | Status: DC | PRN

## 2017-08-10 MED ORDER — DIPHENHYDRAMINE HCL 50 MG/ML IJ SOLN: 12.5000 mg | INTRAMUSCULAR | Status: DC | PRN

## 2017-08-10 MED ORDER — OXYCODONE-ACETAMINOPHEN 5-325 MG PO TABS: 2.0000 | ORAL_TABLET | ORAL | Status: DC | PRN

## 2017-08-10 MED ORDER — PHENYLEPHRINE 40 MCG/ML (10ML) SYRINGE FOR IV PUSH (FOR BLOOD PRESSURE SUPPORT)
80.0000 ug | PREFILLED_SYRINGE | INTRAVENOUS | Status: DC | PRN
  Filled 2017-08-10: qty 5

## 2017-08-10 MED ORDER — LIDOCAINE HCL (PF) 1 % IJ SOLN
INTRAMUSCULAR | Status: DC | PRN
  Administered 2017-08-10 (×2): 4 mL

## 2017-08-10 MED ORDER — PENICILLIN G POT IN DEXTROSE 60000 UNIT/ML IV SOLN
3.0000 10*6.[IU] | INTRAVENOUS | Status: DC
  Filled 2017-08-10: qty 50

## 2017-08-10 NOTE — Anesthesia Preprocedure Evaluation (Signed)
Anesthesia Evaluation  Patient identified by MRN, date of birth, ID band Patient awake    Reviewed: Allergy & Precautions, H&P , Patient's Chart, lab work & pertinent test results  History of Anesthesia Complications Negative for: history of anesthetic complications  Airway Mallampati: II  TM Distance: >3 FB Neck ROM: full    Dental no notable dental hx. (+) Teeth Intact   Pulmonary neg pulmonary ROS, asthma ,    Pulmonary exam normal breath sounds clear to auscultation       Cardiovascular Normal cardiovascular exam+ dysrhythmias Supra Ventricular Tachycardia  Rhythm:regular Rate:Normal     Neuro/Psych negative neurological ROS  negative psych ROS   GI/Hepatic negative GI ROS, Neg liver ROS,   Endo/Other  negative endocrine ROS  Renal/GU negative Renal ROS  negative genitourinary   Musculoskeletal   Abdominal   Peds  Hematology  (+) anemia ,   Anesthesia Other Findings   Reproductive/Obstetrics (+) Pregnancy                             Anesthesia Physical  Anesthesia Plan  ASA: II  Anesthesia Plan: Epidural   Post-op Pain Management:    Induction:   PONV Risk Score and Plan:   Airway Management Planned:   Additional Equipment:   Intra-op Plan:   Post-operative Plan:   Informed Consent: I have reviewed the patients History and Physical, chart, labs and discussed the procedure including the risks, benefits and alternatives for the proposed anesthesia with the patient or authorized representative who has indicated his/her understanding and acceptance.     Plan Discussed with:   Anesthesia Plan Comments:         Anesthesia Quick Evaluation

## 2017-08-10 NOTE — Anesthesia Procedure Notes (Signed)
Epidural Patient location during procedure: OB  Staffing Anesthesiologist: Zyanna Leisinger Performed: anesthesiologist   Preanesthetic Checklist Completed: patient identified, pre-op evaluation, timeout performed, IV checked, risks and benefits discussed and monitors and equipment checked  Epidural Patient position: sitting Prep: site prepped and draped and DuraPrep Patient monitoring: heart rate Approach: midline Location: L3-L4 Injection technique: LOR air and LOR saline  Needle:  Needle type: Tuohy  Needle gauge: 17 G Needle length: 9 cm Needle insertion depth: 4 cm Catheter type: closed end flexible Catheter size: 19 Gauge Catheter at skin depth: 10 cm Test dose: negative  Assessment Sensory level: T8 Events: blood not aspirated, injection not painful, no injection resistance, negative IV test and no paresthesia  Additional Notes Reason for block:procedure for pain     

## 2017-08-10 NOTE — H&P (Signed)
LABOR AND DELIVERY ADMISSION HISTORY AND PHYSICAL NOTE  Sherre Wooton is a 31 y.o. female [phone] with IUP at [redacted]w[redacted]d by first trimester [place] presenting for SOL.   She reports positive fetal movement. She denies leakage of fluid or vaginal bleeding. She is having frequent contractions that are very strong. She is uncomfortable and nauseated.   She has SVT which she takes metoprolol for 50 mg at night and 75 in morning.   Her prior children were born vaginally with "long awful drawn out labors".  Prenatal History/Complications:  Past Medical History: Past Medical History:  Diagnosis Date  . Allergic rhinitis   . Anemia   . Asthma    last used inhaler 1 month ago  . Female bladder prolapse   . GERD (gastroesophageal reflux disease)   . Pelvic pain in antepartum period in second trimester 01/17/2014  . Pregnant 08/01/2015  . PSVT (paroxysmal supraventricular tachycardia) (HCC)    could not be confirmed with 2016 study  . Rectocele   . Urethral prolapse   . Uterine prolapse   . Vaginal Pap smear, abnormal     Past Surgical History: Past Surgical History:  Procedure Laterality Date  . SUPRAVENTRICULAR TACHYCARDIA ABLATION  01/05/2015   unsuccessful  . SUPRAVENTRICULAR TACHYCARDIA ABLATION N/A 01/05/2015   Procedure: SUPRAVENTRICULAR TACHYCARDIA ABLATION;  Surgeon: Marinus Maw, MD;  Location[facility] C[facility] LAB;  Service: Cardiovascular;  Laterality: N/A;    Obstetrical History: OB History    Gravida Para Term Preterm AB Living   6 5 5     5    SAB TAB Ectopic Multiple Live Births         0 5      Social History: Social History   Social History  . Marital status: Married    Spouse name: N/A  . Number of children: N/A  . Years of education: N/A   Social History Main Topics  . Smoking status: Never Smoker  . Smokeless tobacco: Never Used  . Alcohol use No  . Drug use: No  . Sexual activity: Yes    Birth control/ protection: None   Other Topics Concern  . None    Social History Narrative  . None    Family History: Family History  Problem Relation Age of Onset  . Hypertension Mother   . Hyperlipidemia Mother   . Multiple sclerosis Mother   . COPD Mother   . Cancer Mother        melanoma x 2  . Diabetes Mother   . Heart disease Father        PSVT  . Hyperlipidemia Brother   . Hypertension Brother   . Heart disease Maternal Grandfather        heart attack  . Cancer Paternal Grandmother        pancreatic cancer  . Heart disease Paternal Grandmother        MI  . Hyperlipidemia Paternal Grandmother   . Hypertension Paternal Grandmother   . Asthma Daughter   . Asthma Son   . Autism Son   . ADD / ADHD Son   . Tourette syndrome Son   . Other Son        Neurological and mental issues  . ADD / ADHD Son     Allergies: Allergies  Allergen Reactions  . Latex Dermatitis    Prescriptions Prior to Admission  Medication Sig Dispense Refill Last Dose  . albuterol (PROVENTIL HFA;VENTOLIN HFA) 108 (90 Base) MCG/ACT inhaler Inhale into the  lungs every 6 (six) hours as needed for wheezing or shortness of breath.   Taking  . butalbital-acetaminophen-caffeine (FIORICET, ESGIC) 50-325-40 MG tablet Take by mouth 2 (two) times daily as needed for headache.   Taking  . metoprolol tartrate (LOPRESSOR) 50 MG tablet Take 1 twice daily 60 tablet 3   . omeprazole (PRILOSEC) 20 MG capsule Take 1 capsule (20 mg total) by mouth daily. 1 tablet a day 30 capsule 6   . ondansetron (ZOFRAN) 4 MG tablet Take 1 tablet (4 mg total) by mouth every 8 (eight) hours as needed for nausea or vomiting. 20 tablet 0 Taking  . Prenatal Vit-Fe Fumarate-FA (MULTIVITAMIN-PRENATAL) 27-0.8 MG TABS tablet Take 1 tablet by mouth daily at 12 noon.   Taking     Review of Systems   All systems reviewed and negative except as stated in HPI  Blood pressure (!) 146/75, pulse 72, temperature 98.2 F (36.8 C), temperature source Oral, resp. rate 18, last menstrual period  10/23/2016, SpO2 100 %, currently breastfeeding. General appearance: alert and cooperative Lungs: no respiratory distress Heart: regular rate Abdomen: soft, non-tender Extremities: No calf swelling or tenderness Presentation: cephalic by nurse exam Fetal monitoring: baseline 135 moderate variability, accels present no decels Uterine activity: q3-4 mins Dilation: 3.5 Effacement (%): 80, 90 Station: -1 Exam by:: Domenic Polite   Prenatal labs: ABO, Rh: A/Negative/-- (02/22 1007) Antibody: Negative (05/25 1041) Rubella: immune RPR: Non Reactive (05/25 1041)  HBsAg: Negative (02/22 1007)  HIV:   NR GBS:   not tested, + with past 5 births Genetic screening:  Too late, declined Anatomy US: normal female   Prenatal Transfer Tool  Maternal Diabetes: unclear, patient refused testing Genetic Screening: Declined Maternal Ultrasounds/Referrals: Normal Fetal Ultrasounds or other Referrals:  None Maternal Substance Abuse:  No Significant Maternal Medications:  Meds include: Other: metoprolol, fiorcet, omeprazole Significant Maternal Lab Results: Lab values include: Group B Strep negative  No results found for this or any previous visit (from the past 24 hour(s)).  Patient Active Problem List   Diagnosis Date Noted  . Normal labor 08/10/2017  . Uterine/bladder prolapse 02/13/2017  . History of recurrent UTI (urinary tract infection) 02/27/2016  . Migraines 10/03/2015  . Supervision of normal pregnancy 09/05/2015  . Trigger point of shoulder region 06/20/2015  . Rh negative state in antepartum period 01/31/2014  . Abnormal Pap smear of cervix 01/31/2014  . Paroxysmal SVT (supraventricular tachycardia) (HCC) 11/09/2013  . Asthma, stable 11/09/2013    Assessment: Juwana Radziewicz is a 31 y.o. G6P5005 at [redacted]w[redacted]d here for SOL  #Labor: SOL #Pain: epidural #FWB: Cat 1 tracing #ID:  GBS unknown, PCR ordered #MOF: breast #MOC: BTL desired- states she discussed with Dr. Despina Hidden  #Circ:   outpatient  Tillman Sers, DO PGY-2 8/19/20189:52 PM  CNM attestation:  I have seen and examined this patient; I agree with above documentation in the resident's note.   Avory Platero is a 31 y.o. [phone] here for latent labor; she had intermittent care until 28wks at [facility], then had no further visits; Rh neg; desires BTL; hx LGA with no GDM testing this preg  PE: BP (!) 104/46   Pulse 63   Temp 98.3 F (36.8 C) (Oral)   Resp 18   Ht 5\' 4"  (1.626 m)   Wt 79.4 kg (175 lb)   LMP 10/23/2016 (Approximate)   SpO2 100%   BMI 30.04 kg/m  Gen: calm comfortable, NAD Resp: normal effort, no distress Abd: gravid  ROS,  labs, PMH reviewed  Plan: [facility] to [facility] GBS PCR collected here- resulted neg Will get UDS and SW due to ltd care Eval for Rhogam after delivery Anticipate SVD- no mention of difficulty w/ shoulders w/ prev 9+13 baby  Cam Hai CNM 08/11/2017, 12:15 AM

## 2017-08-11 ENCOUNTER — Encounter (HOSPITAL_COMMUNITY): Payer: Self-pay

## 2017-08-11 ENCOUNTER — Encounter (HOSPITAL_COMMUNITY): Payer: Self-pay | Admitting: *Deleted

## 2017-08-11 DIAGNOSIS — Z3A40 40 weeks gestation of pregnancy: Secondary | ICD-10-CM

## 2017-08-11 LAB — RAPID URINE DRUG SCREEN, HOSP PERFORMED
Amphetamines: NOT DETECTED
BENZODIAZEPINES: NOT DETECTED
Barbiturates: NOT DETECTED
COCAINE: NOT DETECTED
OPIATES: NOT DETECTED
Tetrahydrocannabinol: NOT DETECTED

## 2017-08-11 MED ORDER — SENNOSIDES-DOCUSATE SODIUM 8.6-50 MG PO TABS
2.0000 | ORAL_TABLET | ORAL | Status: DC
  Administered 2017-08-11: 2 via ORAL
  Filled 2017-08-11: qty 2

## 2017-08-11 MED ORDER — METOPROLOL TARTRATE 50 MG PO TABS
50.0000 mg | ORAL_TABLET | Freq: Every morning | ORAL | Status: DC
  Administered 2017-08-11 – 2017-08-12 (×2): 50 mg via ORAL
  Filled 2017-08-11 (×3): qty 1

## 2017-08-11 MED ORDER — DIPHENHYDRAMINE HCL 25 MG PO CAPS: 25.0000 mg | ORAL_CAPSULE | Freq: Four times a day (QID) | ORAL | Status: DC | PRN

## 2017-08-11 MED ORDER — ONDANSETRON HCL 4 MG PO TABS: 4.0000 mg | ORAL_TABLET | ORAL | Status: DC | PRN

## 2017-08-11 MED ORDER — METOPROLOL TARTRATE 25 MG PO TABS
25.0000 mg | ORAL_TABLET | Freq: Every day | ORAL | Status: DC
  Administered 2017-08-11: 25 mg via ORAL
  Filled 2017-08-11 (×2): qty 1

## 2017-08-11 MED ORDER — TETANUS-DIPHTH-ACELL PERTUSSIS 5-2.5-18.5 LF-MCG/0.5 IM SUSP: 0.5000 mL | Freq: Once | INTRAMUSCULAR | Status: DC

## 2017-08-11 MED ORDER — ONDANSETRON HCL 4 MG/2ML IJ SOLN: 4.0000 mg | INTRAMUSCULAR | Status: DC | PRN

## 2017-08-11 MED ORDER — ACETAMINOPHEN 325 MG PO TABS
650.0000 mg | ORAL_TABLET | ORAL | Status: DC | PRN
  Administered 2017-08-11: 650 mg via ORAL
  Filled 2017-08-11: qty 2

## 2017-08-11 MED ORDER — RHO D IMMUNE GLOBULIN 1500 UNIT/2ML IJ SOSY
300.0000 ug | PREFILLED_SYRINGE | Freq: Once | INTRAMUSCULAR | Status: AC
  Administered 2017-08-11: 300 ug via INTRAVENOUS
  Filled 2017-08-11: qty 2

## 2017-08-11 MED ORDER — PRENATAL MULTIVITAMIN CH
1.0000 | ORAL_TABLET | Freq: Every day | ORAL | Status: DC
  Administered 2017-08-11 – 2017-08-12 (×2): 1 via ORAL
  Filled 2017-08-11 (×2): qty 1

## 2017-08-11 MED ORDER — IBUPROFEN 600 MG PO TABS
600.0000 mg | ORAL_TABLET | Freq: Four times a day (QID) | ORAL | Status: DC
  Administered 2017-08-11 – 2017-08-12 (×6): 600 mg via ORAL
  Filled 2017-08-11 (×6): qty 1

## 2017-08-11 MED ORDER — DIBUCAINE 1 % RE OINT: 1.0000 "application " | TOPICAL_OINTMENT | RECTAL | Status: DC | PRN

## 2017-08-11 MED ORDER — ZOLPIDEM TARTRATE 5 MG PO TABS: 5.0000 mg | ORAL_TABLET | Freq: Every evening | ORAL | Status: DC | PRN

## 2017-08-11 MED ORDER — WITCH HAZEL-GLYCERIN EX PADS: 1.0000 "application " | MEDICATED_PAD | CUTANEOUS | Status: DC | PRN

## 2017-08-11 MED ORDER — SIMETHICONE 80 MG PO CHEW: 80.0000 mg | CHEWABLE_TABLET | ORAL | Status: DC | PRN

## 2017-08-11 MED ORDER — METOPROLOL TARTRATE 50 MG PO TABS
75.0000 mg | ORAL_TABLET | Freq: Every morning | ORAL | Status: DC
  Filled 2017-08-11: qty 1

## 2017-08-11 MED ORDER — OXYCODONE HCL 5 MG PO TABS: 5.0000 mg | ORAL_TABLET | ORAL | Status: DC | PRN

## 2017-08-11 MED ORDER — COCONUT OIL OIL: 1.0000 "application " | TOPICAL_OIL | Status: DC | PRN

## 2017-08-11 MED ORDER — BENZOCAINE-MENTHOL 20-0.5 % EX AERO: 1.0000 "application " | INHALATION_SPRAY | CUTANEOUS | Status: DC | PRN

## 2017-08-11 NOTE — Lactation Note (Signed)
This note was copied from a baby's chart. Lactation Consultation Note  Patient Name: Sharon Rowland Date: 08/11/2017 Reason for consult: Initial assessment   Maternal Data Has patient been taught Hand Expression?:  (mom reports she remembers) Does the patient have breastfeeding experience prior to this delivery?: Yes  Initial visit at 10 hours of age.  Mom is resting with lights dim. Lc asked mom is she prefers LC return later and mom declines.  Baby sleep in crib, swaddled with noisy respirations.  Mom reports good feedings and denies pain with latching.  Mom denies problems with baby latching due to nasal stuffiness.   Mom reports experience with 5 older children nursing for 6-18 months each.  Mom denies concerns at this time.   FOB at bedside and reports baby is doing well with feedings. [facility] LC resources given and discussed.  Encouraged to feed with early cues on demand.  Early newborn behavior discussed.  Mom reports she is seeing colostrum with hand expression and remembers about spoon feeding as needed.   Mom to call for assist as needed.    Feeding Feeding Type: Breast Fed Length of feed: 15 min  LATCH Score                   Interventions    Lactation Tools Discussed/Used     Consult Status Consult Status: PRN    Franz Dell 08/11/2017, 12:31 PM

## 2017-08-11 NOTE — Progress Notes (Signed)
Patient requesting a change in her Lopressor dose.  She states that after the baby is born she has to start weaning off of it because the dose is too much.  She is requesting to take 50 mg in the morning and 25 mg at bedtime.  MD notified, waiting for orders.  Will continue to monitor.

## 2017-08-11 NOTE — Progress Notes (Signed)
CSW acknowledges consult.  However, consult was screened out due to  MOB having more than 3 prenatal visits.   Please contact the Clinical Social Worker if needs arise, by [facility] request, or if MOB scores 9 or greater/yes to question 10 on Edinburgh Postpartum Depression Screen.   There are no barriers to d/c.  Blaine Hamper, MSW, LCSW Clinical Social Work [phone]

## 2017-08-11 NOTE — Anesthesia Postprocedure Evaluation (Signed)
Anesthesia Post Note  Patient: Sharon Rowland  Procedure(s) Performed: * No procedures listed *     Patient location during evaluation: Mother Baby Anesthesia Type: Epidural Level of consciousness: awake, awake and alert and oriented Pain management: pain level controlled Vital Signs Assessment: post-procedure vital signs reviewed and stable Respiratory status: spontaneous breathing, nonlabored ventilation and respiratory function stable Cardiovascular status: stable Postop Assessment: no headache, no backache, patient able to bend at knees, no signs of nausea or vomiting and adequate PO intake Anesthetic complications: no    Last Vitals:  Vitals:   08/11/17 0357 08/11/17 0510  BP: (!) 104/56 (!) 96/53  Pulse: (!) 55 87  Resp: 18 18  Temp: 36.8 C 37.1 C  SpO2:      Last Pain:  Vitals:   08/11/17 0510  TempSrc: Oral  PainSc: 0-No pain   Pain Goal:                 [name]

## 2017-08-11 NOTE — Progress Notes (Signed)
UR chart review completed.  

## 2017-08-11 NOTE — Progress Notes (Signed)
Patient ID: Sharon Rowland, female   DOB: 10/23/1986, 31 y.o.   MRN: [id number]  Getting more comfortable w/ epidural  BP 104/46, other VSS FHR 130s, +accels, no decels, occ mi variables Ctx q 4-6 mins Cx 6/90/0, stretchy GBS PCR resulted neg  IUP@term  Active labor GBS neg  Attempted AROM, but membrane tight against head and did not get a satisfactory amt of return Plan to recheck cx in 2 hrs or sooner prn Anticipate SVD Order Jodi Geralds CNM 08/11/2017 12:09 AM

## 2017-08-12 LAB — RH IG WORKUP (INCLUDES ABO/RH)
ABO/RH(D): A NEG
FETAL SCREEN: NEGATIVE
GESTATIONAL AGE(WKS): 40.5
Unit division: 0

## 2017-08-12 LAB — RPR: RPR: NONREACTIVE

## 2017-08-12 LAB — BIRTH TISSUE RECOVERY COLLECTION (PLACENTA DONATION)

## 2017-08-12 MED ORDER — IBUPROFEN 600 MG PO TABS: 600.0000 mg | ORAL_TABLET | Freq: Four times a day (QID) | ORAL | 0 refills | Status: DC

## 2017-08-12 NOTE — Lactation Note (Signed)
This note was copied from a baby's chart. Lactation Consultation Note  Patient Name: Sharon Rowland [id number] Date: 08/12/2017 Reason for consult: Follow-up assessment Baby at 31 hr of life. Upon entry baby was gone to have a hearing screen and mom was resting in the bed. Mom stated, "feeding is fine, I don't need any lactation services".   Maternal Data    Feeding    LATCH Score                   Interventions    Lactation Tools Discussed/Used     Consult Status Consult Status: Complete Follow-up type: Call as needed    Sharon Rowland 08/12/2017, 9:22 AM

## 2017-08-12 NOTE — Discharge Instructions (Signed)

## 2017-08-12 NOTE — Discharge Summary (Signed)
OB Discharge Summary  Patient Name: Sharon Rowland DOB: 02/06/1986 MRN: [id number]  Date of admission: 08/10/2017 Delivering MD: Dolores Patty C   Date of discharge: 08/12/2017  Admitting diagnosis: 40Wks Labor Intrauterine pregnancy: [redacted]w[redacted]d     Secondary diagnosis:Active Problems:   Normal labor      Discharge diagnosis: Term Pregnancy Delivered        Complications: None  Hospital course:  Onset of Labor With Vaginal Delivery     31 y.o. yo [id number] at [redacted]w[redacted]d was admitted in Active Labor on 08/10/2017. Patient had an uncomplicated labor course as follows:  Membrane Rupture Time/Date: 11:40 PM ,08/10/2017   Intrapartum Procedures: Episiotomy: None [1]                                         Lacerations:  None [1]  Patient had a delivery of a Viable infant. 08/11/2017  Information for the patient's newborn:  Jaskiran, Nauer [[id number]]  Delivery Method: Vag-Spont    Pateint had an uncomplicated postpartum course.  She is ambulating, tolerating a regular diet, passing flatus, and urinating well. Patient is discharged home in stable condition on 08/12/17.   Physical exam  Vitals:   08/11/17 1015 08/11/17 1638 08/11/17 2128 08/12/17 0548  BP: (!) 104/57 (!) 100/57 108/69 (!) 95/55  Pulse: 81 62 76 (!) 57  Resp: 18 18  18   Temp: 99 F (37.2 C) 97.8 F (36.6 C)  (!) 97.5 F (36.4 C)  TempSrc: Oral Oral  Oral  SpO2:  99%    Weight:      Height:       General: alert Lochia: appropriate Uterine Fundus: firm DVT Evaluation: No evidence of DVT seen on physical exam. Labs: Lab Results  Component Value Date   WBC 12.4 (H) 08/10/2017   HGB 10.2 (L) 08/10/2017   HCT 32.2 (L) 08/10/2017   MCV 74.7 (L) 08/10/2017   PLT 203 08/10/2017   CMP Latest Ref Rng & Units 01/31/2016  Glucose 65 - 99 mg/dL 81  BUN 6 - 20 mg/dL 3(L)  Creatinine 7.37 - 1.00 mg/dL 1.06  Sodium 269 - 485 mmol/L 137  Potassium 3.5 - 5.2 mmol/L 3.8  Chloride 96 - 106 mmol/L 99  CO2 18 - 29  mmol/L 21  Calcium 8.7 - 10.2 mg/dL 9.1  Total Protein 6.0 - 8.5 g/dL 5.9(L)  Total Bilirubin 0.0 - 1.2 mg/dL 0.2  Alkaline Phos 39 - 117 IU/L 75  AST 0 - 40 IU/L 5  ALT 0 - 32 IU/L 8    Discharge instruction: per After Visit Summary and "Baby and Me Booklet".  After Visit Meds:  Allergies as of 08/12/2017      Reactions   Latex Dermatitis      Medication List    TAKE these medications   acetaminophen 500 MG tablet Commonly known as:  TYLENOL Take 1,000 mg by mouth every 6 (six) hours as needed for mild pain or headache.   albuterol 108 (90 Base) MCG/ACT inhaler Commonly known as:  PROVENTIL HFA;VENTOLIN HFA Inhale into the lungs every 6 (six) hours as needed for wheezing or shortness of breath.   butalbital-acetaminophen-caffeine 50-325-40 MG tablet Commonly known as:  FIORICET, ESGIC Take 1 tablet by mouth 2 (two) times daily as needed for headache.   ibuprofen 600 MG tablet Commonly known as:  ADVIL,MOTRIN Take 1 tablet (  600 mg total) by mouth every 6 (six) hours.   metoprolol tartrate 50 MG tablet Commonly known as:  LOPRESSOR Take 1 twice daily What changed:  how much to take  how to take this  when to take this  additional instructions   multivitamin-prenatal 27-0.8 MG Tabs tablet Take 1 tablet by mouth daily at 12 noon.   omeprazole 20 MG capsule Commonly known as:  PRILOSEC Take 1 capsule (20 mg total) by mouth daily. 1 tablet a day What changed:  when to take this  reasons to take this  additional instructions       Diet: routine diet  Activity: Advance as tolerated. Pelvic rest for 4 weeks.   Outpatient follow up:6 weeks Follow up Appt:No future appointments. Follow up visit: No Follow-up on file.  Postpartum contraception: Undecided  Newborn Data: Live born female  Birth Weight: 8 lb 0.9 oz (3655 g) APGAR: 9, 10  Baby Feeding: Breast Disposition:home with mother   08/12/2017 Allie Bossier, MD

## 2017-08-12 NOTE — Progress Notes (Signed)
Discharge education for mother and infant given.  Follow up appointments and medications reviewed.  Pt verbalizes understanding and has not needs at this time.

## 2017-08-12 NOTE — Plan of Care (Signed)
Problem: Pain Management: Goal: General experience of comfort will improve and pain level will decrease Outcome: Progressing Encouraged pt to call if she needs pain medication outside of motrin. (Pt had tylenol earlier in the day.) Pt states she has tylenol from home in room to take. Explained to pt to please call RN for pain medication while in hospital instead of taking own medication.

## 2017-09-10 ENCOUNTER — Ambulatory Visit (INDEPENDENT_AMBULATORY_CARE_PROVIDER_SITE_OTHER): Payer: Medicaid Other | Admitting: Adult Health

## 2017-09-10 ENCOUNTER — Encounter: Payer: Self-pay | Admitting: Adult Health

## 2017-09-10 DIAGNOSIS — R5383 Other fatigue: Secondary | ICD-10-CM | POA: Diagnosis not present

## 2017-09-10 DIAGNOSIS — Z862 Personal history of diseases of the blood and blood-forming organs and certain disorders involving the immune mechanism: Secondary | ICD-10-CM

## 2017-09-10 NOTE — Progress Notes (Signed)
Patient ID: Sharon Rowland, female   DOB: 1986-02-18, 31 y.o.   MRN: [id number] Sharon Rowland is a 31 year old white female,G6P6,  in for postpartum visit.She delivered at 40+5 weeks baby boy, Jeri Modena, 8 lbs 1 oz.   Delivery Date:08/11/17 by [facility].   Method of Delivery: Vaginal delivery  Sexual Activity since delivery: No   Method of Feeding: Breast   Number of weeks bleeding post delivery: Still bleeding  Review of Systems: Patient denies any headaches, hearing loss, blurred vision, shortness of breath, chest pain, abdominal pain, problems with bowel movements, urination, or intercourse(not having sex). No joint pain or mood swings. +tired Reviewed past medical,surgical, social and family history. Reviewed medications and allergies.   Depression Score: 4, denies being depressed just tired, denies any suicidal or homicidal ideations.    BP 100/60 (BP Location: Left Arm, Patient Position: Sitting, Cuff Size: Small)   Pulse 80   Ht 5\' 4"  (1.626 m)   Wt 175 lb 6.4 oz (79.6 kg)   LMP 10/23/2016 (Approximate)   Breastfeeding? Yes   BMI 30.11 kg/m   Skin warm and dry,pale. Neck: mid line trachea, normal thyroid, good ROM, no lymphadenopathy noted. Lungs: clear to ausculation bilaterally. Cardiovascular: regular rate and rhythm. Pelvic Exam:   External genitalia is normal in appearance, no lesions.  The vagina has good color, moisture and rugae, no lesions.Urethra has no masses or tenderness noted. The cervix is bulbous.  Uterus is felt to be normal size, shape, and contour, well involuted. Uterine descensus.No adnexal masses or tenderness noted.Bladder is non tender, no masses felt. She says that Dr Despina Hidden is supposed to do surgery, and she wants ASAP.   Impression:  1. Postpartum care and examination   2. Tired   3. History of anemia      Plan:   Check CBC, CMP and TSH Return in 1 day for pre op with Dr Despina Hidden

## 2017-09-11 ENCOUNTER — Encounter: Payer: Self-pay | Admitting: Obstetrics & Gynecology

## 2017-09-11 ENCOUNTER — Ambulatory Visit (INDEPENDENT_AMBULATORY_CARE_PROVIDER_SITE_OTHER): Payer: Medicaid Other | Admitting: Obstetrics & Gynecology

## 2017-09-11 VITALS — BP 104/62 | HR 67 | Ht 64.0 in | Wt 173.0 lb

## 2017-09-11 DIAGNOSIS — Z01818 Encounter for other preprocedural examination: Secondary | ICD-10-CM | POA: Diagnosis not present

## 2017-09-11 DIAGNOSIS — N814 Uterovaginal prolapse, unspecified: Secondary | ICD-10-CM | POA: Diagnosis not present

## 2017-09-11 LAB — COMPREHENSIVE METABOLIC PANEL
ALBUMIN: 4.5 g/dL (ref 3.5–5.5)
ALT: 17 IU/L (ref 0–32)
AST: 6 IU/L (ref 0–40)
Albumin/Globulin Ratio: 2 (ref 1.2–2.2)
Alkaline Phosphatase: 87 IU/L (ref 39–117)
BUN/Creatinine Ratio: 15 (ref 9–23)
BUN: 10 mg/dL (ref 6–20)
Bilirubin Total: 0.4 mg/dL (ref 0.0–1.2)
CO2: 22 mmol/L (ref 20–29)
CREATININE: 0.66 mg/dL (ref 0.57–1.00)
Calcium: 9.1 mg/dL (ref 8.7–10.2)
Chloride: 104 mmol/L (ref 96–106)
GFR calc Af Amer: 136 mL/min/{1.73_m2} (ref >59)
GFR, EST NON AFRICAN AMERICAN: 118 mL/min/{1.73_m2} (ref >59)
GLOBULIN, TOTAL: 2.3 g/dL (ref 1.5–4.5)
Glucose: 86 mg/dL (ref 65–99)
Potassium: 4 mmol/L (ref 3.5–5.2)
SODIUM: 141 mmol/L (ref 134–144)
Total Protein: 6.8 g/dL (ref 6.0–8.5)

## 2017-09-11 LAB — CBC
HEMATOCRIT: 33.5 % — AB (ref 34.0–46.6)
HEMOGLOBIN: 10.7 g/dL — AB (ref 11.1–15.9)
MCH: 24 pg — ABNORMAL LOW (ref 26.6–33.0)
MCHC: 31.9 g/dL (ref 31.5–35.7)
MCV: 75 fL — ABNORMAL LOW (ref 79–97)
Platelets: 206 10*3/uL (ref 150–379)
RBC: 4.46 x10E6/uL (ref 3.77–5.28)
RDW: 18.6 % — ABNORMAL HIGH (ref 12.3–15.4)
WBC: 6.4 10*3/uL (ref 3.4–10.8)

## 2017-09-11 LAB — TSH: TSH: 1.76 u[IU]/mL (ref 0.450–4.500)

## 2017-09-12 NOTE — Patient Instructions (Signed)
Your procedure is scheduled on: 09/17/2017  Report to [facility] at  8:40   AM.  Call this number if you have problems the morning of surgery: [phone]   Remember:   Do not drink or eat food:After Midnight.  :  Take these medicines the morning of surgery with A SIP OF WATER: Metoprolol, omeprazole and use albuterol inhalers   Do not wear jewelry, make-up or nail polish.  Do not wear lotions, powders, or perfumes. You may wear deodorant.  Do not shave 48 hours prior to surgery. Men may shave face and neck.  Do not bring valuables to the hospital.  Contacts, dentures or bridgework may not be worn into surgery.  Leave suitcase in the car. After surgery it may be brought to your room.  For patients admitted to the hospital, checkout time is 11:00 AM the day of discharge.   Patients discharged the day of surgery will not be allowed to drive home.    Special Instructions: Shower using CHG night before surgery and shower the day of surgery use CHG.  Use special wash - you have one bottle of CHG for all showers.  You should use approximately 1/2 of the bottle for each shower.  Vaginal Hysterectomy, Care After Refer to this sheet in the next few weeks. These instructions provide you with information about caring for yourself after your procedure. Your health care provider may also give you more specific instructions. Your treatment has been planned according to current medical practices, but problems sometimes occur. Call your health care provider if you have any problems or questions after your procedure. What can I expect after the procedure? After the procedure, it is common to have:  Pain.  Soreness and numbness in your incision areas.  Vaginal bleeding and discharge.  Constipation.  Temporary problems emptying the bladder.  Feelings of sadness or other emotions.  Follow these instructions at home: Medicines  Take over-the-counter and prescription medicines only as told by  your health care provider.  If you were prescribed an antibiotic medicine, take it as told by your health care provider. Do not stop taking the antibiotic even if you start to feel better.  Do not drive or operate heavy machinery while taking prescription pain medicine. Activity  Return to your normal activities as told by your health care provider. Ask your health care provider what activities are safe for you.  Get regular exercise as told by your health care provider. You may be told to take short walks every day and go farther each time.  Do not lift anything that is heavier than 10 lb (4.5 kg). General instructions   Do not put anything in your vagina for 6 weeks after your surgery or as told by your health care provider. This includes tampons and douches.  Do not have sex until your health care provider says you can.  Do not take baths, swim, or use a hot tub until your health care provider approves.  Drink enough fluid to keep your urine clear or pale yellow.  Do not drive for 24 hours if you were given a sedative.  Keep all follow-up visits as told by your health care provider. This is important. Contact a health care provider if:  Your pain medicine is not helping.  You have a fever.  You have redness, swelling, or pain at your incision site.  You have blood, pus, or a bad-smelling discharge from your vagina.  You continue to have difficulty urinating.  Get help right away if:  You have severe abdominal or back pain.  You have heavy bleeding from your vagina.  You have chest pain or shortness of breath. This information is not intended to replace advice given to you by your health care provider. Make sure you discuss any questions you have with your health care provider. Document Released: 04/01/2016 Document Revised: 05/16/2016 Document Reviewed: 12/24/2015 Elsevier Interactive Patient Education  2018 [employer]  Cystocele Repair, Care After This sheet  gives you information about how to care for yourself after your procedure. Your health care provider may also give you more specific instructions. If you have problems or questions, contact your health care provider. What can I expect after the procedure? After the procedure, it is common to have: Bloody discharge from the vagina for 1-2 weeks. Soreness or discomfort in the vaginal area.  Follow these instructions at home: Activity Exercise as told by your health care provider. Do not do any exercise that puts more pressure on your abdomen, such as lifting weights or doing sit-ups, until your health care provider says it is okay. Do not lift anything that is heavier than 10 lb (4.5 kg) until your health care provider says that it is safe. Return to your normal activities as told by your health care provider. Ask your health care provider what activities are safe for you. Eating and drinking Drink enough fluid to keep your urine clear or pale yellow. You can go back to your normal diet. Eat a well-balanced diet that includes fruits, vegetables, whole grains, and fat-free or low-fat dairy products. General instructions Do not douche, use tampons, or have sexual intercourse for 6 weeks after your surgery. Do not take baths, swim, or use a hot tub until your health care provider approves. Take showers until your health care provider says that you can do otherwise. Avoid straining during bowel movements. If you are constipated, you may try: Drinking more fluids. Avoiding alcohol, caffeine, and soda. Eating fewer foods that are high in fat and processed sugars, such as french fries, hamburgers, cookies, and candy. Taking a stool softener if your health care provider says it is okay. If you have a thin tube (catheter) in place to drain urine, follow instructions from your health care provider about how to empty the bag. Take over-the-counter and prescription medicines only as told by your health care  provider. Donot drive or use heavy machinery while taking prescription pain medicine. Keep all follow-up visits as told by your health care provider. This is important. Contact a health care provider if: You have nausea or vomiting. You have vaginal pain that is not helped by pain medicines. You have problems with urination, such as: Feeling a burning sensation during urination. Urinating too often. Urinating less often or a smaller amount than usual (urinary retention). Get help right away if: You have redness, swelling, or a bad-smelling discharge from the vagina. You notice a bad smell coming from the vagina. You have pus coming from the vagina. You cannot urinate. You have a fever. You have pain in your abdomen. You have too much vaginal bleeding. You have chest pain or shortness of breath. This information is not intended to replace advice given to you by your health care provider. Make sure you discuss any questions you have with your health care provider. Document Released: 06/28/2005 Document Revised: 07/12/2016 Document Reviewed: 02/29/2016 Elsevier Interactive Patient Education  2018 [employer]. General Anesthesia, Adult, Care After These instructions provide you with  information about caring for yourself after your procedure. Your health care provider may also give you more specific instructions. Your treatment has been planned according to current medical practices, but problems sometimes occur. Call your health care provider if you have any problems or questions after your procedure. What can I expect after the procedure? After the procedure, it is common to have:  Vomiting.  A sore throat.  Mental slowness.  It is common to feel:  Nauseous.  Cold or shivery.  Sleepy.  Tired.  Sore or achy, even in parts of your body where you did not have surgery.  Follow these instructions at home: For at least 24 hours after the procedure:  Do not: ? Participate in  activities where you could fall or become injured. ? Drive. ? Use heavy machinery. ? Drink alcohol. ? Take sleeping pills or medicines that cause drowsiness. ? Make important decisions or sign legal documents. ? Take care of children on your own.  Rest. Eating and drinking  If you vomit, drink water, juice, or soup when you can drink without vomiting.  Drink enough fluid to keep your urine clear or pale yellow.  Make sure you have little or no nausea before eating solid foods.  Follow the diet recommended by your health care provider. General instructions  Have a responsible adult stay with you until you are awake and alert.  Return to your normal activities as told by your health care provider. Ask your health care provider what activities are safe for you.  Take over-the-counter and prescription medicines only as told by your health care provider.  If you smoke, do not smoke without supervision.  Keep all follow-up visits as told by your health care provider. This is important. Contact a health care provider if:  You continue to have nausea or vomiting at home, and medicines are not helpful.  You cannot drink fluids or start eating again.  You cannot urinate after 8-12 hours.  You develop a skin rash.  You have fever.  You have increasing redness at the site of your procedure. Get help right away if:  You have difficulty breathing.  You have chest pain.  You have unexpected bleeding.  You feel that you are having a life-threatening or urgent problem. This information is not intended to replace advice given to you by your health care provider. Make sure you discuss any questions you have with your health care provider. Document Released: 03/17/2001 Document Revised: 05/13/2016 Document Reviewed: 11/23/2015 Elsevier Interactive Patient Education  [employer].

## 2017-09-15 ENCOUNTER — Encounter (HOSPITAL_COMMUNITY): Payer: Self-pay

## 2017-09-15 ENCOUNTER — Encounter (HOSPITAL_COMMUNITY)
Admission: RE | Admit: 2017-09-15 | Discharge: 2017-09-15 | Disposition: A | Discharge status: Home / Self Care | Payer: Medicaid Other | Source: Ambulatory Visit | Attending: Obstetrics & Gynecology | Admitting: Obstetrics & Gynecology

## 2017-09-15 ENCOUNTER — Other Ambulatory Visit: Payer: Self-pay | Admitting: Obstetrics & Gynecology

## 2017-09-15 ENCOUNTER — Other Ambulatory Visit: Payer: Self-pay

## 2017-09-15 DIAGNOSIS — G43909 Migraine, unspecified, not intractable, without status migrainosus: Secondary | ICD-10-CM | POA: Insufficient documentation

## 2017-09-15 DIAGNOSIS — N814 Uterovaginal prolapse, unspecified: Secondary | ICD-10-CM | POA: Diagnosis not present

## 2017-09-15 DIAGNOSIS — Z01812 Encounter for preprocedural laboratory examination: Secondary | ICD-10-CM | POA: Diagnosis not present

## 2017-09-15 DIAGNOSIS — Z87440 Personal history of urinary (tract) infections: Secondary | ICD-10-CM | POA: Diagnosis not present

## 2017-09-15 DIAGNOSIS — D649 Anemia, unspecified: Secondary | ICD-10-CM | POA: Insufficient documentation

## 2017-09-15 DIAGNOSIS — K219 Gastro-esophageal reflux disease without esophagitis: Secondary | ICD-10-CM | POA: Insufficient documentation

## 2017-09-15 DIAGNOSIS — Z01810 Encounter for preprocedural cardiovascular examination: Secondary | ICD-10-CM | POA: Diagnosis present

## 2017-09-15 DIAGNOSIS — I498 Other specified cardiac arrhythmias: Secondary | ICD-10-CM | POA: Insufficient documentation

## 2017-09-15 DIAGNOSIS — J45909 Unspecified asthma, uncomplicated: Secondary | ICD-10-CM | POA: Diagnosis not present

## 2017-09-15 HISTORY — DX: Cardiac arrhythmia, unspecified: I49.9

## 2017-09-15 LAB — CBC
HCT: 35.8 % — ABNORMAL LOW (ref 36.0–46.0)
Hemoglobin: 11.2 g/dL — ABNORMAL LOW (ref 12.0–15.0)
MCH: 24.6 pg — ABNORMAL LOW (ref 26.0–34.0)
MCHC: 31.3 g/dL (ref 30.0–36.0)
MCV: 78.7 fL (ref 78.0–100.0)
PLATELETS: 204 10*3/uL (ref 150–400)
RBC: 4.55 MIL/uL (ref 3.87–5.11)
RDW: 17.9 % — AB (ref 11.5–15.5)
WBC: 6.6 10*3/uL (ref 4.0–10.5)

## 2017-09-15 LAB — URINALYSIS, ROUTINE W REFLEX MICROSCOPIC
BILIRUBIN URINE: NEGATIVE
Bacteria, UA: NONE SEEN
GLUCOSE, UA: NEGATIVE mg/dL
KETONES UR: NEGATIVE mg/dL
NITRITE: NEGATIVE
PH: 6 (ref 5.0–8.0)
Protein, ur: NEGATIVE mg/dL
SPECIFIC GRAVITY, URINE: 1.004 — AB (ref 1.005–1.030)

## 2017-09-15 LAB — RAPID HIV SCREEN (HIV 1/2 AB+AG)
HIV 1/2 Antibodies: NONREACTIVE
HIV-1 P24 Antigen - HIV24: NONREACTIVE

## 2017-09-15 LAB — COMPREHENSIVE METABOLIC PANEL
ALT: 23 U/L (ref 14–54)
AST: 14 U/L — AB (ref 15–41)
Albumin: 4.2 g/dL (ref 3.5–5.0)
Alkaline Phosphatase: 81 U/L (ref 38–126)
Anion gap: 10 (ref 5–15)
BUN: 8 mg/dL (ref 6–20)
CALCIUM: 9.1 mg/dL (ref 8.9–10.3)
CO2: 26 mmol/L (ref 22–32)
CREATININE: 0.69 mg/dL (ref 0.44–1.00)
Chloride: 104 mmol/L (ref 101–111)
GFR calc Af Amer: >60 mL/min (ref >60)
Glucose, Bld: 71 mg/dL (ref 65–99)
POTASSIUM: 3.9 mmol/L (ref 3.5–5.1)
Sodium: 140 mmol/L (ref 135–145)
TOTAL PROTEIN: 7.4 g/dL (ref 6.5–8.1)
Total Bilirubin: 0.4 mg/dL (ref 0.3–1.2)

## 2017-09-15 LAB — SURGICAL PCR SCREEN
MRSA, PCR: NEGATIVE
STAPHYLOCOCCUS AUREUS: NEGATIVE

## 2017-09-15 LAB — HCG, QUANTITATIVE, PREGNANCY: HCG, BETA CHAIN, QUANT, S: 1 m[IU]/mL (ref <5)

## 2017-09-17 ENCOUNTER — Encounter (HOSPITAL_COMMUNITY): Payer: Self-pay | Admitting: *Deleted

## 2017-09-17 ENCOUNTER — Inpatient Hospital Stay (HOSPITAL_COMMUNITY): Payer: Medicaid Other | Admitting: Anesthesiology

## 2017-09-17 ENCOUNTER — Encounter (HOSPITAL_COMMUNITY)
Admission: RE | Disposition: A | Discharge status: Home / Self Care | Payer: Self-pay | Source: Ambulatory Visit | Attending: Obstetrics & Gynecology

## 2017-09-17 ENCOUNTER — Observation Stay (HOSPITAL_COMMUNITY)
Admission: RE | Admit: 2017-09-17 | Discharge: 2017-09-18 | Disposition: A | Discharge status: Home / Self Care | Payer: Medicaid Other | Source: Ambulatory Visit | Attending: Obstetrics & Gynecology | Admitting: Obstetrics & Gynecology

## 2017-09-17 DIAGNOSIS — N920 Excessive and frequent menstruation with regular cycle: Secondary | ICD-10-CM | POA: Insufficient documentation

## 2017-09-17 DIAGNOSIS — Z90710 Acquired absence of both cervix and uterus: Secondary | ICD-10-CM | POA: Diagnosis present

## 2017-09-17 DIAGNOSIS — Z23 Encounter for immunization: Secondary | ICD-10-CM | POA: Diagnosis not present

## 2017-09-17 DIAGNOSIS — N888 Other specified noninflammatory disorders of cervix uteri: Secondary | ICD-10-CM | POA: Diagnosis not present

## 2017-09-17 DIAGNOSIS — N946 Dysmenorrhea, unspecified: Secondary | ICD-10-CM | POA: Insufficient documentation

## 2017-09-17 DIAGNOSIS — N72 Inflammatory disease of cervix uteri: Secondary | ICD-10-CM | POA: Insufficient documentation

## 2017-09-17 DIAGNOSIS — N7011 Chronic salpingitis: Secondary | ICD-10-CM | POA: Insufficient documentation

## 2017-09-17 DIAGNOSIS — N760 Acute vaginitis: Secondary | ICD-10-CM | POA: Diagnosis not present

## 2017-09-17 DIAGNOSIS — N810 Urethrocele: Secondary | ICD-10-CM | POA: Diagnosis not present

## 2017-09-17 DIAGNOSIS — N812 Incomplete uterovaginal prolapse: Principal | ICD-10-CM | POA: Insufficient documentation

## 2017-09-17 HISTORY — PX: CYSTOCELE REPAIR: SHX163

## 2017-09-17 HISTORY — PX: VAGINAL HYSTERECTOMY: SHX2639

## 2017-09-17 SURGERY — HYSTERECTOMY, VAGINAL
Anesthesia: General

## 2017-09-17 MED ORDER — PROMETHAZINE HCL 25 MG/ML IJ SOLN
12.5000 mg | Freq: Once | INTRAMUSCULAR | Status: AC
  Administered 2017-09-17: 12.5 mg via INTRAVENOUS

## 2017-09-17 MED ORDER — BUPIVACAINE HCL (PF) 0.5 % IJ SOLN
INTRAMUSCULAR | Status: AC
  Filled 2017-09-17: qty 30

## 2017-09-17 MED ORDER — LIDOCAINE HCL (PF) 1 % IJ SOLN
INTRAMUSCULAR | Status: AC
  Filled 2017-09-17: qty 5

## 2017-09-17 MED ORDER — SIMETHICONE 80 MG PO CHEW: 80.0000 mg | CHEWABLE_TABLET | Freq: Four times a day (QID) | ORAL | Status: DC | PRN

## 2017-09-17 MED ORDER — GLYCOPYRROLATE 0.2 MG/ML IJ SOLN
INTRAMUSCULAR | Status: AC
  Filled 2017-09-17: qty 3

## 2017-09-17 MED ORDER — ONDANSETRON HCL 4 MG PO TABS
8.0000 mg | ORAL_TABLET | Freq: Four times a day (QID) | ORAL | Status: DC | PRN
  Administered 2017-09-17 – 2017-09-18 (×2): 8 mg via ORAL
  Filled 2017-09-17 (×2): qty 2

## 2017-09-17 MED ORDER — SODIUM CHLORIDE 0.9% FLUSH
INTRAVENOUS | Status: AC
  Filled 2017-09-17: qty 10

## 2017-09-17 MED ORDER — PROMETHAZINE HCL 25 MG/ML IJ SOLN: 25.0000 mg | Freq: Four times a day (QID) | INTRAMUSCULAR | Status: DC | PRN

## 2017-09-17 MED ORDER — MIDAZOLAM HCL 2 MG/2ML IJ SOLN
1.0000 mg | INTRAMUSCULAR | Status: AC
  Administered 2017-09-17: 2 mg via INTRAVENOUS

## 2017-09-17 MED ORDER — CEFAZOLIN SODIUM-DEXTROSE 2-4 GM/100ML-% IV SOLN
INTRAVENOUS | Status: AC
  Filled 2017-09-17: qty 100

## 2017-09-17 MED ORDER — SENNOSIDES-DOCUSATE SODIUM 8.6-50 MG PO TABS: 1.0000 | ORAL_TABLET | Freq: Every evening | ORAL | Status: DC | PRN

## 2017-09-17 MED ORDER — HYDROMORPHONE HCL 1 MG/ML IJ SOLN
0.2500 mg | INTRAMUSCULAR | Status: DC | PRN
  Administered 2017-09-17 (×2): 0.5 mg via INTRAVENOUS
  Filled 2017-09-17: qty 1

## 2017-09-17 MED ORDER — ROCURONIUM BROMIDE 100 MG/10ML IV SOLN
INTRAVENOUS | Status: DC | PRN
  Administered 2017-09-17: 30 mg via INTRAVENOUS
  Administered 2017-09-17 (×2): 10 mg via INTRAVENOUS

## 2017-09-17 MED ORDER — PROPOFOL 10 MG/ML IV BOLUS
INTRAVENOUS | Status: DC | PRN
  Administered 2017-09-17: 150 mg via INTRAVENOUS

## 2017-09-17 MED ORDER — HYDROMORPHONE HCL 1 MG/ML IJ SOLN
1.0000 mg | INTRAMUSCULAR | Status: DC | PRN
  Administered 2017-09-17 – 2017-09-18 (×7): 1 mg via INTRAVENOUS
  Filled 2017-09-17 (×7): qty 1

## 2017-09-17 MED ORDER — ONDANSETRON HCL 40 MG/20ML IJ SOLN
8.0000 mg | Freq: Four times a day (QID) | INTRAMUSCULAR | Status: DC | PRN
Start: 2017-09-17 — End: 2017-09-18
  Filled 2017-09-17: qty 4

## 2017-09-17 MED ORDER — KCL IN DEXTROSE-NACL 20-5-0.45 MEQ/L-%-% IV SOLN
INTRAVENOUS | Status: DC
  Administered 2017-09-17 – 2017-09-18 (×3): via INTRAVENOUS

## 2017-09-17 MED ORDER — ROCURONIUM BROMIDE 50 MG/5ML IV SOLN
INTRAVENOUS | Status: AC
  Filled 2017-09-17: qty 1

## 2017-09-17 MED ORDER — NEOSTIGMINE METHYLSULFATE 10 MG/10ML IV SOLN
INTRAVENOUS | Status: AC
  Filled 2017-09-17: qty 1

## 2017-09-17 MED ORDER — BUPIVACAINE HCL (PF) 0.5 % IJ SOLN
INTRAMUSCULAR | Status: DC | PRN
  Administered 2017-09-17: 30 mL

## 2017-09-17 MED ORDER — KETOROLAC TROMETHAMINE 30 MG/ML IJ SOLN
30.0000 mg | Freq: Once | INTRAMUSCULAR | Status: AC
  Administered 2017-09-17: 30 mg via INTRAVENOUS

## 2017-09-17 MED ORDER — GLYCOPYRROLATE 0.2 MG/ML IJ SOLN
INTRAMUSCULAR | Status: DC | PRN
  Administered 2017-09-17: 0.6 mg via INTRAVENOUS

## 2017-09-17 MED ORDER — CEFAZOLIN SODIUM-DEXTROSE 2-4 GM/100ML-% IV SOLN
2.0000 g | INTRAVENOUS | Status: AC
  Administered 2017-09-17: 2 g via INTRAVENOUS

## 2017-09-17 MED ORDER — LACTATED RINGERS IV SOLN
INTRAVENOUS | Status: DC
  Administered 2017-09-17 (×3): via INTRAVENOUS

## 2017-09-17 MED ORDER — OXYCODONE-ACETAMINOPHEN 5-325 MG PO TABS
1.0000 | ORAL_TABLET | ORAL | Status: DC | PRN
  Administered 2017-09-17: 1 via ORAL
  Administered 2017-09-18 (×2): 2 via ORAL
  Filled 2017-09-17: qty 2
  Filled 2017-09-17: qty 1
  Filled 2017-09-17: qty 2

## 2017-09-17 MED ORDER — PROMETHAZINE HCL 25 MG/ML IJ SOLN
INTRAMUSCULAR | Status: AC
  Filled 2017-09-17: qty 1

## 2017-09-17 MED ORDER — DEXAMETHASONE SODIUM PHOSPHATE 4 MG/ML IJ SOLN
INTRAMUSCULAR | Status: AC
  Filled 2017-09-17: qty 1

## 2017-09-17 MED ORDER — ALBUTEROL SULFATE HFA 108 (90 BASE) MCG/ACT IN AERS: 2.0000 | INHALATION_SPRAY | Freq: Four times a day (QID) | RESPIRATORY_TRACT | Status: DC | PRN

## 2017-09-17 MED ORDER — ZOLPIDEM TARTRATE 5 MG PO TABS: 5.0000 mg | ORAL_TABLET | Freq: Every evening | ORAL | Status: DC | PRN

## 2017-09-17 MED ORDER — MIDAZOLAM HCL 5 MG/5ML IJ SOLN
INTRAMUSCULAR | Status: DC | PRN
  Administered 2017-09-17 (×2): 2 mg via INTRAVENOUS

## 2017-09-17 MED ORDER — DOCUSATE SODIUM 100 MG PO CAPS
100.0000 mg | ORAL_CAPSULE | Freq: Two times a day (BID) | ORAL | Status: DC
  Administered 2017-09-17 – 2017-09-18 (×3): 100 mg via ORAL
  Filled 2017-09-17 (×2): qty 1

## 2017-09-17 MED ORDER — MIDAZOLAM HCL 2 MG/2ML IJ SOLN
INTRAMUSCULAR | Status: AC
  Filled 2017-09-17: qty 2

## 2017-09-17 MED ORDER — PROPOFOL 10 MG/ML IV BOLUS
INTRAVENOUS | Status: AC
  Filled 2017-09-17: qty 20

## 2017-09-17 MED ORDER — DEXAMETHASONE SODIUM PHOSPHATE 4 MG/ML IJ SOLN
4.0000 mg | Freq: Once | INTRAMUSCULAR | Status: AC
  Administered 2017-09-17: 4 mg via INTRAVENOUS

## 2017-09-17 MED ORDER — ALBUTEROL SULFATE (2.5 MG/3ML) 0.083% IN NEBU: 2.5000 mg | INHALATION_SOLUTION | Freq: Four times a day (QID) | RESPIRATORY_TRACT | Status: DC | PRN

## 2017-09-17 MED ORDER — KETOROLAC TROMETHAMINE 30 MG/ML IJ SOLN
INTRAMUSCULAR | Status: AC
  Filled 2017-09-17: qty 1

## 2017-09-17 MED ORDER — INFLUENZA VAC SPLIT QUAD 0.5 ML IM SUSY
0.5000 mL | PREFILLED_SYRINGE | INTRAMUSCULAR | Status: AC
  Administered 2017-09-18 (×2): 0.5 mL via INTRAMUSCULAR
  Filled 2017-09-17: qty 0.5

## 2017-09-17 MED ORDER — BISACODYL 10 MG RE SUPP: 10.0000 mg | Freq: Every day | RECTAL | Status: DC | PRN

## 2017-09-17 MED ORDER — ONDANSETRON HCL 4 MG/2ML IJ SOLN
INTRAMUSCULAR | Status: AC
  Filled 2017-09-17: qty 2

## 2017-09-17 MED ORDER — LIDOCAINE HCL 1 % IJ SOLN
INTRAMUSCULAR | Status: DC | PRN
  Administered 2017-09-17: 25 mg via INTRADERMAL

## 2017-09-17 MED ORDER — FENTANYL CITRATE (PF) 100 MCG/2ML IJ SOLN
INTRAMUSCULAR | Status: DC | PRN
  Administered 2017-09-17 (×3): 50 ug via INTRAVENOUS
  Administered 2017-09-17 (×2): 100 ug via INTRAVENOUS

## 2017-09-17 MED ORDER — PANTOPRAZOLE SODIUM 40 MG PO TBEC
40.0000 mg | DELAYED_RELEASE_TABLET | Freq: Every day | ORAL | Status: DC
  Administered 2017-09-18: 40 mg via ORAL
  Filled 2017-09-17: qty 1

## 2017-09-17 MED ORDER — NEOSTIGMINE METHYLSULFATE 10 MG/10ML IV SOLN
INTRAVENOUS | Status: DC | PRN
  Administered 2017-09-17 (×2): 2 mg via INTRAVENOUS

## 2017-09-17 MED ORDER — FENTANYL CITRATE (PF) 250 MCG/5ML IJ SOLN
INTRAMUSCULAR | Status: AC
  Filled 2017-09-17: qty 5

## 2017-09-17 MED ORDER — ONDANSETRON HCL 4 MG/2ML IJ SOLN
4.0000 mg | Freq: Once | INTRAMUSCULAR | Status: AC
  Administered 2017-09-17: 4 mg via INTRAVENOUS

## 2017-09-17 SURGICAL SUPPLY — 45 items
APPLIER CLIP 13 LRG OPEN (CLIP) ×3
BAG HAMPER (MISCELLANEOUS) ×3 IMPLANT
BLADE SURG SZ10 CARB STEEL (BLADE) ×3 IMPLANT
CLIP APPLIE 13 LRG OPEN (CLIP) ×1 IMPLANT
CLOTH BEACON ORANGE TIMEOUT ST (SAFETY) ×3 IMPLANT
COVER LIGHT HANDLE STERIS (MISCELLANEOUS) ×6 IMPLANT
DECANTER SPIKE VIAL GLASS SM (MISCELLANEOUS) ×3 IMPLANT
DRAPE HALF SHEET 40X57 (DRAPES) ×3 IMPLANT
DRAPE PROXIMA HALF (DRAPES) ×3 IMPLANT
DRAPE STERI URO 9X17 APER PCH (DRAPES) ×3 IMPLANT
ELECT REM PT RETURN 9FT ADLT (ELECTROSURGICAL) ×3
ELECTRODE REM PT RTRN 9FT ADLT (ELECTROSURGICAL) ×1 IMPLANT
FORMALIN 10 PREFIL 480ML (MISCELLANEOUS) ×3 IMPLANT
GAUZE PACKING IODOFORM 1X5 (MISCELLANEOUS) ×6 IMPLANT
GAUZE PACKING IODOFORM 2 (PACKING) ×3 IMPLANT
GAUZE SPONGE 4X4 12PLY STRL (GAUZE/BANDAGES/DRESSINGS) ×3 IMPLANT
GLOVE BIOGEL PI IND STRL 7.0 (GLOVE) ×3 IMPLANT
GLOVE BIOGEL PI IND STRL 8 (GLOVE) ×1 IMPLANT
GLOVE BIOGEL PI INDICATOR 7.0 (GLOVE) ×6
GLOVE BIOGEL PI INDICATOR 8 (GLOVE) ×2
GLOVE SKINSENSE NS SZ7.0 (GLOVE) ×4
GLOVE SKINSENSE NS SZ8.0 LF (GLOVE) ×4
GLOVE SKINSENSE STRL SZ7.0 (GLOVE) ×2 IMPLANT
GLOVE SKINSENSE STRL SZ8.0 LF (GLOVE) ×2 IMPLANT
GOWN STRL REUS W/TWL LRG LVL3 (GOWN DISPOSABLE) ×6 IMPLANT
GOWN STRL REUS W/TWL XL LVL3 (GOWN DISPOSABLE) ×3 IMPLANT
IV NS IRRIG 3000ML ARTHROMATIC (IV SOLUTION) ×3 IMPLANT
KIT ROOM TURNOVER AP CYSTO (KITS) ×3 IMPLANT
KIT ROOM TURNOVER APOR (KITS) ×3 IMPLANT
MANIFOLD NEPTUNE II (INSTRUMENTS) ×3 IMPLANT
NEEDLE HYPO 22GX1.5 SAFETY (NEEDLE) ×3 IMPLANT
NS IRRIG 1000ML POUR BTL (IV SOLUTION) ×3 IMPLANT
PACK PERI GYN (CUSTOM PROCEDURE TRAY) ×3 IMPLANT
PAD ARMBOARD 7.5X6 YLW CONV (MISCELLANEOUS) ×3 IMPLANT
SET BASIN LINEN APH (SET/KITS/TRAYS/PACK) ×3 IMPLANT
SET IV ADMIN VERSALIGHT (MISCELLANEOUS) ×3 IMPLANT
SUT MNCRL+ AB 3-0 CT1 36 (SUTURE) IMPLANT
SUT MON AB 3-0 SH 27 (SUTURE) ×3 IMPLANT
SUT MONOCRYL AB 3-0 CT1 36IN (SUTURE)
SUT VIC AB 0 CT1 27 (SUTURE) ×6
SUT VIC AB 0 CT1 27XCR 8 STRN (SUTURE) ×3 IMPLANT
SUT VIC AB 0 CT2 8-18 (SUTURE) ×3 IMPLANT
SYR CONTROL 10ML LL (SYRINGE) ×3 IMPLANT
TRAY FOLEY BAG SILVER LF 16FR (SET/KITS/TRAYS/PACK) ×3 IMPLANT
VERSALIGHT (MISCELLANEOUS) ×3 IMPLANT

## 2017-09-17 NOTE — Op Note (Signed)
Preoperative diagnosis:  1.  Grade 2-3 cystocele                                         2.  Grade 2 uterine prolapse                                         3.  Both of which predated this last pregnancy and delivery                                         4.  Menorrhagia                                         5.  Dysmenorrhea  Postoperative diagnosis:  Same as above   Procedure:  Vaginal hysterectomy with removal of both Fallopian tubes, and McCall culdoplasty, anterior colporrhaphy with Nicholaus Bloom plication  Surgeon:  Lazaro Arms MD  Anesthesia:  General Endotracheal  Findings:  Grade II-III cystocoele, symptomatic with Grade II uterine prolapse  Intraoperative findings were same as pre op.  Ovaries tubes and peritoneum were normal  Description of operation:  The patient was taken from the preoperative area to the operating room in stable condition. She was placed in the supine position where she underwent general endotracheal anesthesia.  Once an adequate level of anesthesia was attained she was placed in the dorsal lithotomy position. Patient was prepped and draped in the usual sterile fashion and a Foley catheter was placed.  A weighted speculum was placed and the cervix was grasped with thyroid tenaculums both anteriorly and posteriorly.  0.5% Marcaine plain was injected in a circumferential fashion about the cervix and the electrocautery unit was used to incise the vagina and push at all cervix. Note: I did not use Marcaine with epinephrine because of the patient's history of supraventricular tachycardia.    The posterior cul-de-sac was then entered sharply without difficulty.  The uterosacral ligaments were clamped cut and transfixion suture ligated and held.  The cardinal ligaments were then clamped cut transfixion suture ligated and cut. The anterior peritoneum was identified the anterior cul-de-sac was entered sharply without difficulty. The anterior and posterior leaves of the broad  ligament were plicated and the uterine vessels were clamped cut and suture ligated. Serial pedicles were taken of the fundus with each pedicle being clamped cut and suture ligated. The utero-ovarian ligaments were crossclamped the uterus was removed and both pedicles were transfixion suture ligated. The fallopian tube's were isolated and crossclamped with a Heaney clamp and a 4 suture was placed with good resulting hemostasis.  There was good hemostasis of all the pedicles.   Preoperatively I talked to the patient at length regarding a McCall culdoplasty .  I informed the patient with her age being 44 that it is likely that she is cannot have to have prolapse surgery done again in her life and the Rogelio Seen culdoplasty can at least get a longer vaginal apex support .  However he does carry with it about a 10-20% risk of positional dyspareunia she understands this and now wanted to do it  nonetheless in order to try to provide adequate support for the vaginal apex.  A McCall culdoplasty suture was in place in the usual fashion .  The vagina and posterior peritoneum were identified and a suture placed through that and then the left uterosacral picked up the posterior peritoneum the right uterosacral and back out the posterior vagina.  The suture was then tied down The peritoneum was then closed in a pursestring fashion using 3-0 Vicryl. The anterior posterior vagina was closed in interrupted fashion with good resultant hemostasis. Before closure of the lower pelvis and vagina were irrigated vigorously.   Vaginal angle sutures were then placed and held.  The anterior vagina was grasped in the midline with's clamps and half percent Marcaine plain was injected into the anterior vagina to demonstrate the vesicovaginal.  The anterior vagina was then incised and dissected off of the bladder without difficulty by sharply and bluntly.  The fascia underneath the bladder was isolated and a Kelly plication was performed in the  usual fashion.  The fascia was then imbricated down the entire length of the bladder providing excellent support at the end of the repair.  Small amount of vagina was trimmed away and the vagina was closed with interrupted sutures without difficulty.  The vagina was in close anterior to posterior once the anterior repair had been completed.  The vagina was then packed with 2 inch iodoform gauze soaked in water down [employer]. All the pedicles were reevaluated once again and all the pedicles were found to be hemostatic.  Of course with a procedure like this in a patient who delivered about 6 weeks ago you would expect there to be some oozing in the vesicovaginal space and I would expect that postoperatively.  I took great care to ensure there was no blood vessels that were bleeding laterally as I dissected the bladder off of the vagina and I feel very comfortable that any bleeding that occursis just a oozing because of the lack of tamponade pressure. The packing will be removed in the morning and the Foley catheter will be left in place until that happens.   The sponge needle and instrument counts were correct x 3.  Total blood loss for the procedure was 350 cc. Again the patient is 6 weeks postpartum and I could not use epinephrine in my local anesthetic because of her supraventricular tachycardia The patient received 2 g of Ancef and 30 mg of Toradol IV preoperatively prophylactically.  She was taken to the recovery room in good stable condition awake alert doing well.  Gianluca Chhim H 09/17/2017, 1:03 PM

## 2017-09-17 NOTE — Anesthesia Postprocedure Evaluation (Signed)
Anesthesia Post Note  Patient: Julea Abdon  Procedure(s) Performed: Procedure(s) (LRB): HYSTERECTOMY VAGINAL WITH POSSIBLE BILATERAL SALPINGECTOMY (N/A) ANTERIOR REPAIR (CYSTOCELE) (N/A)  Patient location during evaluation: PACU Anesthesia Type: General Level of consciousness: awake and patient cooperative Pain management: pain level controlled Vital Signs Assessment: post-procedure vital signs reviewed and stable Respiratory status: spontaneous breathing, nonlabored ventilation and respiratory function stable Cardiovascular status: blood pressure returned to baseline Postop Assessment: no apparent nausea or vomiting Anesthetic complications: no     Last Vitals:  Vitals:   09/17/17 1400 09/17/17 1415  BP: 101/68 114/63  Pulse: (!) 53 (!) 59  Resp: 10 11  Temp:    SpO2: 100% 100%    Last Pain:  Vitals:   09/17/17 1415  TempSrc:   PainSc: 8                  Kealii Thueson J

## 2017-09-17 NOTE — Anesthesia Procedure Notes (Signed)
Procedure Name: Intubation Date/Time: 09/17/2017 10:59 AM Performed by: Charmaine Downs Pre-anesthesia Checklist: Patient identified, Patient being monitored, Timeout performed, Emergency Drugs available and Suction available Patient Re-evaluated:Patient Re-evaluated prior to induction Oxygen Delivery Method: Circle System Utilized Preoxygenation: Pre-oxygenation with 100% oxygen Induction Type: IV induction Ventilation: Mask ventilation without difficulty Laryngoscope Size: Mac and 4 Grade View: Grade I Tube type: Oral Tube size: 7.0 mm Number of attempts: 1 Airway Equipment and Method: stylet Placement Confirmation: ETT inserted through vocal cords under direct vision,  positive ETCO2 and breath sounds checked- equal and bilateral Secured at: 22 cm Tube secured with: Tape Dental Injury: Teeth and Oropharynx as per pre-operative assessment

## 2017-09-17 NOTE — Transfer of Care (Signed)
Immediate Anesthesia Transfer of Care Note  Patient: Sharon Rowland  Procedure(s) Performed: Procedure(s): HYSTERECTOMY VAGINAL WITH POSSIBLE BILATERAL SALPINGECTOMY (N/A) ANTERIOR REPAIR (CYSTOCELE) (N/A)  Patient Location: PACU  Anesthesia Type:General  Level of Consciousness: awake and patient cooperative  Airway & Oxygen Therapy: Patient Spontanous Breathing and Patient connected to face mask oxygen  Post-op Assessment: Report given to RN, Post -op Vital signs reviewed and stable and Patient moving all extremities  Post vital signs: Reviewed and stable  Last Vitals:  Vitals:   09/17/17 1035 09/17/17 1040  BP: (!) 96/59 (!) 93/58  Pulse:    Resp: 13 (!) 28  Temp:    SpO2: 98% 99%    Last Pain:  Vitals:   09/17/17 0913  TempSrc: Oral         Complications: No apparent anesthesia complications

## 2017-09-17 NOTE — Anesthesia Preprocedure Evaluation (Signed)
Anesthesia Evaluation  Patient identified by MRN, date of birth, ID band Patient awake    Reviewed: Allergy & Precautions, H&P , NPO status , Patient's Chart, lab work & pertinent test results  History of Anesthesia Complications Negative for: history of anesthetic complications  Airway Mallampati: II  TM Distance: >3 FB Neck ROM: full    Dental no notable dental hx. (+) Teeth Intact   Pulmonary neg pulmonary ROS, asthma ,    Pulmonary exam normal breath sounds clear to auscultation       Cardiovascular Normal cardiovascular exam+ dysrhythmias Supra Ventricular Tachycardia  Rhythm:regular Rate:Normal     Neuro/Psych  Headaches, negative neurological ROS  negative psych ROS   GI/Hepatic negative GI ROS, Neg liver ROS,   Endo/Other  negative endocrine ROS  Renal/GU negative Renal ROS     Musculoskeletal   Abdominal   Peds  Hematology  (+) anemia ,   Anesthesia Other Findings   Reproductive/Obstetrics                             Anesthesia Physical Anesthesia Plan  ASA: II  Anesthesia Plan: General   Post-op Pain Management:    Induction: Intravenous  PONV Risk Score and Plan:   Airway Management Planned: Oral ETT  Additional Equipment:   Intra-op Plan:   Post-operative Plan: Extubation in OR  Informed Consent: I have reviewed the patients History and Physical, chart, labs and discussed the procedure including the risks, benefits and alternatives for the proposed anesthesia with the patient or authorized representative who has indicated his/her understanding and acceptance.     Plan Discussed with:   Anesthesia Plan Comments:         Anesthesia Quick Evaluation

## 2017-09-17 NOTE — H&P (Signed)
Preoperative History and Physical  Sharon Rowland is a 31 y.o. [id number] with No LMP recorded. admitted for a vaginal hysterectomy with removal of Fallopian tubes and anterior colporrhaphy.  The patient actually was having problems with prolapse before this last pregnancy.  She was deciding what she wanted to do because of her symptoms and then she sort of inadvertently got pregnant.  She delivered 8/20 and since delivery has been having increasing prolapse symptoms, she mostl;y feels her bladder as being the problem.  Exam reveals Grade II uterine and bladder prolapse at rest Grade III with valsalva in the supine position  Additionally she has a long history of dysmenorrhea and menorrhagia but this is secondary to the prolapse.  Prior to her prolapse we were considering ablation.   PMH:    Past Medical History:  Diagnosis Date  . Allergic rhinitis   . Anemia   . Asthma    last used inhaler 1 month ago  . Dysrhythmia    SVT  . Female bladder prolapse   . GERD (gastroesophageal reflux disease)    with pregnancy only  . Pelvic pain in antepartum period in second trimester 01/17/2014  . Pregnant 08/01/2015  . PSVT (paroxysmal supraventricular tachycardia) (HCC)    could not be confirmed with 2016 study  . Rectocele   . Urethral prolapse   . Uterine prolapse   . Vaginal Pap smear, abnormal     PSH:     Past Surgical History:  Procedure Laterality Date  . SUPRAVENTRICULAR TACHYCARDIA ABLATION  01/05/2015   unsuccessful  . SUPRAVENTRICULAR TACHYCARDIA ABLATION N/A 01/05/2015   Procedure: SUPRAVENTRICULAR TACHYCARDIA ABLATION;  Surgeon: Marinus Maw, MD;  Location: [facility] CATH LAB;  Service: Cardiovascular;  Laterality: N/A;    POb/GynH:      OB History    Gravida Para Term Preterm AB Living   SAB TAB Ectopic Multiple Live Births         0 6      SH:   Social History  Substance Use Topics  . Smoking status: Never Smoker  . Smokeless tobacco: Never Used  .  Alcohol use No    FH:    Family History  Problem Relation Age of Onset  . Hypertension Mother   . Hyperlipidemia Mother   . Multiple sclerosis Mother   . COPD Mother   . Cancer Mother        melanoma x 2  . Diabetes Mother   . Heart disease Father        PSVT  . Hyperlipidemia Brother   . Hypertension Brother   . Heart disease Maternal Grandfather        heart attack  . Cancer Paternal Grandmother        pancreatic cancer  . Heart disease Paternal Grandmother        MI  . Hyperlipidemia Paternal Grandmother   . Hypertension Paternal Grandmother   . Asthma Daughter   . Asthma Son   . Autism Son   . ADD / ADHD Son   . Tourette syndrome Son   . Other Son        Neurological and mental issues  . ADD / ADHD Son      Allergies:  Allergies  Allergen Reactions  . Epinephrine Other (See Comments)    Pt states she is "sensitive" to epinephrine.   . Latex Dermatitis    Medications:  Current Facility-Administered Medications:  .  ceFAZolin (ANCEF) IVPB 2g/100 mL premix, 2 g, Intravenous, On Call to OR, Lazaro Arms, MD .  lactated ringers infusion, , Intravenous, Continuous, Laurene Footman, MD, Last Rate: 75 mL/hr at 09/17/17 2536  Review of Systems:   Review of Systems  Constitutional: Negative for fever, chills, weight loss, malaise/fatigue and diaphoresis.  HENT: Negative for hearing loss, ear pain, nosebleeds, congestion, sore throat, neck pain, tinnitus and ear discharge.   Eyes: Negative for blurred vision, double vision, photophobia, pain, discharge and redness.  Respiratory: Negative for cough, hemoptysis, sputum production, shortness of breath, wheezing and stridor.   Cardiovascular: Negative for chest pain, palpitations, orthopnea, claudication, leg swelling and PND.  Gastrointestinal: Positive for abdominal pain. Negative for heartburn, nausea, vomiting, diarrhea, constipation, blood in stool and melena.  Genitourinary: Negative for dysuria, urgency,  frequency, hematuria and flank pain.  Musculoskeletal: Negative for myalgias, back pain, joint pain and falls.  Skin: Negative for itching and rash.  Neurological: Negative for dizziness, tingling, tremors, sensory change, speech change, focal weakness, seizures, loss of consciousness, weakness and headaches.  Endo/Heme/Allergies: Negative for environmental allergies and polydipsia. Does not bruise/bleed easily.  Psychiatric/Behavioral: Negative for depression, suicidal ideas, hallucinations, memory loss and substance abuse. The patient is not nervous/anxious and does not have insomnia.      PHYSICAL EXAM:  Blood pressure 108/62, pulse 71, temperature 98.3 F (36.8 C), temperature source Oral, resp. rate 14, height 5\' 4"  (1.626 m), weight 173 lb (78.5 kg), SpO2 98 %, currently breastfeeding.    Vitals reviewed. Constitutional: She is oriented to person, place, and time. She appears well-developed and well-nourished.  HENT:  Head: Normocephalic and atraumatic.  Right Ear: External ear normal.  Left Ear: External ear normal.  Nose: Nose normal.  Mouth/Throat: Oropharynx is clear and moist.  Eyes: Conjunctivae and EOM are normal. Pupils are equal, round, and reactive to light. Right eye exhibits no discharge. Left eye exhibits no discharge. No scleral icterus.  Neck: Normal range of motion. Neck supple. No tracheal deviation present. No thyromegaly present.  Cardiovascular: Normal rate, regular rhythm, normal heart sounds and intact distal pulses.  Exam reveals no gallop and no friction rub.   No murmur heard. Respiratory: Effort normal and breath sounds normal. No respiratory distress. She has no wheezes. She has no rales. She exhibits no tenderness.  GI: Soft. Bowel sounds are normal. She exhibits no distension and no mass. There is tenderness. There is no rebound and no guarding.  Genitourinary:       Vulva is normal without lesions Vagina is pink moist without discharge, Grade II  bladder prolapse at rest Cervix normal in appearance and pap is normal Uterus is normal size, contour, position, consistency, mobility, non-tender with Grade II prolapse Adnexa is negative with normal sized ovaries by sonogram  Musculoskeletal: Normal range of motion. She exhibits no edema and no tenderness.  Neurological: She is alert and oriented to person, place, and time. She has normal reflexes. She displays normal reflexes. No cranial nerve deficit. She exhibits normal muscle tone. Coordination normal.  Skin: Skin is warm and dry. No rash noted. No erythema. No pallor.  Psychiatric: She has a normal mood and affect. Her behavior is normal. Judgment and thought content normal.    Labs: Results for orders placed or performed during the hospital encounter of 09/15/17 (from the past 336 hour(s))  Surgical pcr screen   Collection Time: 09/15/17  1:28 PM  Result Value Ref Range  MRSA, PCR NEGATIVE NEGATIVE   Staphylococcus aureus NEGATIVE NEGATIVE  CBC   Collection Time: 09/15/17  1:28 PM  Result Value Ref Range   WBC 6.6 4.0 - 10.5 K/uL   RBC 4.55 3.87 - 5.11 MIL/uL   Hemoglobin 11.2 (L) 12.0 - 15.0 g/dL   HCT 16.1 (L) 09.6 - 04.5 %   MCV 78.7 78.0 - 100.0 fL   MCH 24.6 (L) 26.0 - 34.0 pg   MCHC 31.3 30.0 - 36.0 g/dL   RDW 40.9 (H) 81.1 - 91.4 %   Platelets 204 150 - 400 K/uL  Comprehensive metabolic panel   Collection Time: 09/15/17  1:28 PM  Result Value Ref Range   Sodium 140 135 - 145 mmol/L   Potassium 3.9 3.5 - 5.1 mmol/L   Chloride 104 101 - 111 mmol/L   CO2 26 22 - 32 mmol/L   Glucose, Bld 71 65 - 99 mg/dL   BUN 8 6 - 20 mg/dL   Creatinine, Ser 7.82 0.44 - 1.00 mg/dL   Calcium 9.1 8.9 - 95.6 mg/dL   Total Protein 7.4 6.5 - 8.1 g/dL   Albumin 4.2 3.5 - 5.0 g/dL   AST 14 (L) 15 - 41 U/L   ALT 23 14 - 54 U/L   Alkaline Phosphatase 81 38 - 126 U/L   Total Bilirubin 0.4 0.3 - 1.2 mg/dL   GFR calc non Af Amer >60 >60 mL/min   GFR calc Af Amer >60 >60 mL/min   Anion  gap 10 5 - 15  hCG, quantitative, pregnancy   Collection Time: 09/15/17  1:28 PM  Result Value Ref Range   hCG, Beta Chain, Quant, S 1 <5 mIU/mL  Rapid HIV screen (HIV 1/2 Ab+Ag)   Collection Time: 09/15/17  1:28 PM  Result Value Ref Range   HIV-1 P24 Antigen - HIV24 NON REACTIVE NON REACTIVE   HIV 1/2 Antibodies NON REACTIVE NON REACTIVE   Interpretation (HIV Ag Ab)      A non reactive test result means that HIV 1 or HIV 2 antibodies and HIV 1 p24 antigen were not detected in the specimen.  Urinalysis, Routine w reflex microscopic   Collection Time: 09/15/17  1:28 PM  Result Value Ref Range   Color, Urine STRAW (A) YELLOW   APPearance CLEAR CLEAR   Specific Gravity, Urine 1.004 (L) 1.005 - 1.030   pH 6.0 5.0 - 8.0   Glucose, UA NEGATIVE NEGATIVE mg/dL   Hgb urine dipstick SMALL (A) NEGATIVE   Bilirubin Urine NEGATIVE NEGATIVE   Ketones, ur NEGATIVE NEGATIVE mg/dL   Protein, ur NEGATIVE NEGATIVE mg/dL   Nitrite NEGATIVE NEGATIVE   Leukocytes, UA TRACE (A) NEGATIVE   RBC / HPF 0-5 0 - 5 RBC/hpf   WBC, UA 0-5 0 - 5 WBC/hpf   Bacteria, UA NONE SEEN NONE SEEN   Squamous Epithelial / LPF 0-5 (A) NONE SEEN  Type and screen   Collection Time: 09/15/17  1:28 PM  Result Value Ref Range   ABO/RH(D) A NEG    Antibody Screen POS    Sample Expiration 09/18/2017    Antibody Identification      PASSIVELY ACQUIRED ANTI-D Performed at [facility], [address], [place], [place] [address]    DAT, IgG NEG    Unit Number [id number]    Blood Component Type RED CELLS,LR    Unit division 00    Status of Unit ALLOCATED    Transfusion Status OK TO TRANSFUSE  Crossmatch Result COMPATIBLE    Unit Number [id number]    Blood Component Type RBC LR PHER2    Unit division 00    Status of Unit ALLOCATED    Transfusion Status OK TO TRANSFUSE    Crossmatch Result COMPATIBLE   BPAM RBC   Collection Time: 09/15/17  1:28 PM  Result Value Ref Range   Blood Product Unit Number  [id number]    Unit Type and Rh 0600    Blood Product Expiration Date [id number]    Blood Product Unit Number [id number]    Unit Type and Rh 0600    Blood Product Expiration Date [id number]   Results for orders placed or performed in visit on 09/10/17 (from the past 336 hour(s))  CBC   Collection Time: 09/10/17  4:43 PM  Result Value Ref Range   WBC 6.4 3.4 - 10.8 x10E3/uL   RBC 4.46 3.77 - 5.28 x10E6/uL   Hemoglobin 10.7 (L) 11.1 - 15.9 g/dL   Hematocrit 74.2 (L) 59.5 - 46.6 %   MCV 75 (L) 79 - 97 fL   MCH 24.0 (L) 26.6 - 33.0 pg   MCHC 31.9 31.5 - 35.7 g/dL   RDW 63.8 (H) 75.6 - 43.3 %   Platelets 206 150 - 379 x10E3/uL  Comprehensive metabolic panel   Collection Time: 09/10/17  4:43 PM  Result Value Ref Range   Glucose 86 65 - 99 mg/dL   BUN 10 6 - 20 mg/dL   Creatinine, Ser 2.95 0.57 - 1.00 mg/dL   GFR calc non Af Amer 118 >59 mL/min/1.73   GFR calc Af Amer 136 >59 mL/min/1.73   BUN/Creatinine Ratio 15 9 - 23   Sodium 141 134 - 144 mmol/L   Potassium 4.0 3.5 - 5.2 mmol/L   Chloride 104 96 - 106 mmol/L   CO2 22 20 - 29 mmol/L   Calcium 9.1 8.7 - 10.2 mg/dL   Total Protein 6.8 6.0 - 8.5 g/dL   Albumin 4.5 3.5 - 5.5 g/dL   Globulin, Total 2.3 1.5 - 4.5 g/dL   Albumin/Globulin Ratio 2.0 1.2 - 2.2   Bilirubin Total 0.4 0.0 - 1.2 mg/dL   Alkaline Phosphatase 87 39 - 117 IU/L   AST 6 0 - 40 IU/L   ALT 17 0 - 32 IU/L  TSH   Collection Time: 09/10/17  4:43 PM  Result Value Ref Range   TSH 1.760 0.450 - 4.500 uIU/mL    EKG: Orders placed or performed during the hospital encounter of 09/15/17  . EKG 12-Lead  . EKG 12-Lead    Imaging Studies: No results found.    Assessment: Grade II uterine and anterior compartment prolapse, rectum is well supported at this time Dysmenorrhea Menorrhagia  Patient Active Problem List   Diagnosis Date Noted  . Normal labor 08/10/2017  . Uterine/bladder prolapse 02/13/2017  . History of recurrent UTI (urinary tract  infection) 02/27/2016  . Migraines 10/03/2015  . Supervision of normal pregnancy 09/05/2015  . Trigger point of shoulder region 06/20/2015  . Rh negative state in antepartum period 01/31/2014  . Abnormal Pap smear of cervix 01/31/2014  . Paroxysmal SVT (supraventricular tachycardia) (HCC) 11/09/2013  . Asthma, stable 11/09/2013    Plan: TVH, removal of tubes, prophylactically, and anterior colporrhaphy  Pt understands the risks of surgery including but not limited t  excessive bleeding requiring transfusion or reoperation, post-operative infection requiring prolonged hospitalization or re-hospitalization and antibiotic therapy, and damage to other organs including bladder, bowel, ureters and  major vessels.  The patient also understands the alternative treatment options which were discussed in full.  All questions were answered.  Luke Falero H 09/17/2017 10:20 AM   Nahara Dona H 09/17/2017 10:14 AM

## 2017-09-18 ENCOUNTER — Encounter (HOSPITAL_COMMUNITY): Payer: Self-pay | Admitting: Obstetrics & Gynecology

## 2017-09-18 DIAGNOSIS — N812 Incomplete uterovaginal prolapse: Secondary | ICD-10-CM | POA: Diagnosis not present

## 2017-09-18 LAB — CBC
HCT: 30.5 % — ABNORMAL LOW (ref 36.0–46.0)
Hemoglobin: 9.3 g/dL — ABNORMAL LOW (ref 12.0–15.0)
MCH: 24.5 pg — ABNORMAL LOW (ref 26.0–34.0)
MCHC: 30.5 g/dL (ref 30.0–36.0)
MCV: 80.5 fL (ref 78.0–100.0)
PLATELETS: 195 10*3/uL (ref 150–400)
RBC: 3.79 MIL/uL — ABNORMAL LOW (ref 3.87–5.11)
RDW: 17.9 % — AB (ref 11.5–15.5)
WBC: 10.3 10*3/uL (ref 4.0–10.5)

## 2017-09-18 LAB — BASIC METABOLIC PANEL
ANION GAP: 8 (ref 5–15)
BUN: 6 mg/dL (ref 6–20)
CALCIUM: 8.9 mg/dL (ref 8.9–10.3)
CO2: 28 mmol/L (ref 22–32)
Chloride: 105 mmol/L (ref 101–111)
Creatinine, Ser: 0.73 mg/dL (ref 0.44–1.00)
GFR calc Af Amer: >60 mL/min (ref >60)
Glucose, Bld: 97 mg/dL (ref 65–99)
Potassium: 4.1 mmol/L (ref 3.5–5.1)
SODIUM: 141 mmol/L (ref 135–145)

## 2017-09-18 MED ORDER — CIPROFLOXACIN HCL 500 MG PO TABS: 500.0000 mg | ORAL_TABLET | Freq: Two times a day (BID) | ORAL | 0 refills | Status: DC

## 2017-09-18 MED ORDER — OXYCODONE-ACETAMINOPHEN 5-325 MG PO TABS: 1.0000 | ORAL_TABLET | ORAL | 0 refills | Status: DC | PRN

## 2017-09-18 MED ORDER — KETOROLAC TROMETHAMINE 30 MG/ML IJ SOLN
30.0000 mg | Freq: Once | INTRAMUSCULAR | Status: AC
  Administered 2017-09-18: 30 mg via INTRAVENOUS
  Filled 2017-09-18: qty 1

## 2017-09-18 MED ORDER — LEVOFLOXACIN IN D5W 750 MG/150ML IV SOLN
750.0000 mg | Freq: Once | INTRAVENOUS | Status: AC
  Administered 2017-09-18: 750 mg via INTRAVENOUS
  Filled 2017-09-18: qty 150

## 2017-09-18 MED ORDER — ONDANSETRON HCL 8 MG PO TABS: 8.0000 mg | ORAL_TABLET | Freq: Four times a day (QID) | ORAL | 0 refills | Status: DC | PRN

## 2017-09-18 MED ORDER — KETOROLAC TROMETHAMINE 10 MG PO TABS: 10.0000 mg | ORAL_TABLET | Freq: Three times a day (TID) | ORAL | 0 refills | Status: DC | PRN

## 2017-09-18 NOTE — Discharge Instructions (Signed)
Vaginal Hysterectomy, Care After °Refer to this sheet in the next few weeks. These instructions provide you with information about caring for yourself after your procedure. Your health care provider may also give you more specific instructions. Your treatment has been planned according to current medical practices, but problems sometimes occur. Call your health care provider if you have any problems or questions after your procedure. °What can I expect after the procedure? °After the procedure, it is common to have: °· Pain. °· Soreness and numbness in your incision areas. °· Vaginal bleeding and discharge. °· Constipation. °· Temporary problems emptying the bladder. °· Feelings of sadness or other emotions. ° °Follow these instructions at home: °Medicines °· Take over-the-counter and prescription medicines only as told by your health care provider. °· If you were prescribed an antibiotic medicine, take it as told by your health care provider. Do not stop taking the antibiotic even if you start to feel better. °· Do not drive or operate heavy machinery while taking prescription pain medicine. °Activity °· Return to your normal activities as told by your health care provider. Ask your health care provider what activities are safe for you. °· Get regular exercise as told by your health care provider. You may be told to take short walks every day and go farther each time. °· Do not lift anything that is heavier than 10 lb (4.5 kg). °General instructions ° °· Do not put anything in your vagina for 6 weeks after your surgery or as told by your health care provider. This includes tampons and douches. °· Do not have sex until your health care provider says you can. °· Do not take baths, swim, or use a hot tub until your health care provider approves. °· Drink enough fluid to keep your urine clear or pale yellow. °· Do not drive for 24 hours if you were given a sedative. °· Keep all follow-up visits as told by your health  care provider. This is important. °Contact a health care provider if: °· Your pain medicine is not helping. °· You have a fever. °· You have redness, swelling, or pain at your incision site. °· You have blood, pus, or a bad-smelling discharge from your vagina. °· You continue to have difficulty urinating. °Get help right away if: °· You have severe abdominal or back pain. °· You have heavy bleeding from your vagina. °· You have chest pain or shortness of breath. °This information is not intended to replace advice given to you by your health care provider. Make sure you discuss any questions you have with your health care provider. °Document Released: 04/01/2016 Document Revised: 05/16/2016 Document Reviewed: 12/24/2015 °Elsevier Interactive Patient Education © 2018 [employer] ° °

## 2017-09-18 NOTE — Discharge Summary (Signed)
Physician Discharge Summary  Patient ID: Sharon Rowland MRN: [id number] DOB/AGE: 06/15/1986 31 y.o.  Admit date: 09/17/2017 Discharge date: 09/18/2017  Admission Diagnoses: Grade II-III pelvic organ prolapse  Discharge Diagnoses:  Active Problems:   S/P vaginal hysterectomy   Discharged Condition: good  Hospital Course: unremarkable post operative course  Consults: None  Significant Diagnostic Studies: labs:   Treatments: surgery: TVH removal of tubes, anterior colporrhaphy  Discharge Exam: Blood pressure 116/65, pulse (!) 52, temperature 98.4 F (36.9 C), temperature source Oral, resp. rate 16, height  (1.626 m), weight 181 lb (82.1 kg), SpO2 98 %, currently breastfeeding. General appearance: alert, cooperative and no distress GI: soft, non-tender; bowel sounds normal; no masses,  no organomegaly  Disposition: 01-Home or Self Care  Discharge Instructions    Call MD for:    Complete by:  As directed    Excessive vaginal bleeding   Call MD for:  persistant nausea and vomiting    Complete by:  As directed    Call MD for:  severe uncontrolled pain    Complete by:  As directed    Call MD for:  temperature >100.4    Complete by:  As directed    Diet - low sodium heart healthy    Complete by:  As directed    Driving Restrictions    Complete by:  As directed    Do not drive for 1 week   Increase activity slowly    Complete by:  As directed    Lifting restrictions    Complete by:  As directed    No lifting more than 10 pounds for 8 weeks   No wound care    Complete by:  As directed    Sexual Activity Restrictions    Complete by:  As directed    Seriously?  Ummmm.no     Allergies as of 09/18/2017      Reactions   Epinephrine Other (See Comments)   Pt states she is "sensitive" to epinephrine.    Latex Dermatitis      Medication List    STOP taking these medications   ibuprofen 200 MG tablet Commonly known as:  ADVIL,MOTRIN   ibuprofen 600 MG  tablet Commonly known as:  ADVIL,MOTRIN   omeprazole 20 MG capsule Commonly known as:  PRILOSEC     TAKE these medications   acetaminophen 500 MG tablet Commonly known as:  TYLENOL Take 1,000 mg by mouth 2 (two) times daily.   albuterol 108 (90 Base) MCG/ACT inhaler Commonly known as:  PROVENTIL HFA;VENTOLIN HFA Inhale 2 puffs into the lungs every 6 (six) hours as needed for wheezing or shortness of breath.   ciprofloxacin 500 MG tablet Commonly known as:  CIPRO Take 1 tablet (500 mg total) by mouth 2 (two) times daily.   ketorolac 10 MG tablet Commonly known as:  TORADOL Take 1 tablet (10 mg total) by mouth every 8 (eight) hours as needed.   metoprolol tartrate 50 MG tablet Commonly known as:  LOPRESSOR Take 1 twice daily   multivitamin-prenatal 27-0.8 MG Tabs tablet Take 1 tablet by mouth 3 (three) times a week.   ondansetron 8 MG tablet Commonly known as:  ZOFRAN Take 1 tablet (8 mg total) by mouth every 6 (six) hours as needed for nausea.   oxyCODONE-acetaminophen 5-325 MG tablet Commonly known as:  PERCOCET/ROXICET Take 1-2 tablets by mouth every 4 (four) hours as needed (moderate to severe pain (when tolerating fluids)).  Discharge Care Instructions        Start     Ordered   09/18/17 0000  ondansetron (ZOFRAN) 8 MG tablet  Every 6 hours PRN     09/18/17 1127   09/18/17 0000  oxyCODONE-acetaminophen (PERCOCET/ROXICET) 5-325 MG tablet  Every 4 hours PRN     09/18/17 1127   09/18/17 0000  Diet - low sodium heart healthy     09/18/17 1127   09/18/17 0000  Increase activity slowly     09/18/17 1127   09/18/17 0000  Driving Restrictions    Comments:  Do not drive for 1 week   86/16/83 1127   09/18/17 0000  Lifting restrictions    Comments:  No lifting more than 10 pounds for 8 weeks   09/18/17 1127   09/18/17 0000  Sexual Activity Restrictions    Comments:  Seriously?  Ummmm.no   09/18/17 1127   09/18/17 0000  No wound care     09/18/17  1127   09/18/17 0000  Call MD for:  temperature >100.4     09/18/17 1127   09/18/17 0000  Call MD for:  persistant nausea and vomiting     09/18/17 1127   09/18/17 0000  Call MD for:  severe uncontrolled pain     09/18/17 1127   09/18/17 0000  Call MD for:    Comments:  Excessive vaginal bleeding   09/18/17 1127   09/18/17 0000  ciprofloxacin (CIPRO) 500 MG tablet  2 times daily     09/18/17 1127   09/18/17 0000  ketorolac (TORADOL) 10 MG tablet  Every 8 hours PRN     09/18/17 1127     Follow-up Information    Lazaro Arms, MD Follow up on 09/26/2017.   Specialties:  Obstetrics and Gynecology, Radiology Why:  post op visit Contact information: [address] [place] [address] [phone]           Signed: Lazaro Arms 09/18/2017, 11:28 AM

## 2017-09-18 NOTE — Addendum Note (Signed)
Addendum  created 09/18/17 0737 by Earleen Newport, CRNA   Sign clinical note

## 2017-09-18 NOTE — Anesthesia Postprocedure Evaluation (Signed)
Anesthesia Post Note  Patient: Tzipporah Jurick  Procedure(s) Performed: Procedure(s) (LRB): HYSTERECTOMY VAGINAL WITH POSSIBLE BILATERAL SALPINGECTOMY (N/A) ANTERIOR REPAIR (CYSTOCELE) (N/A)  Patient location during evaluation: Nursing Unit Anesthesia Type: General Level of consciousness: awake and alert and oriented Pain management: pain level controlled (Receiving Dilaudid for pain control) Vital Signs Assessment: post-procedure vital signs reviewed and stable Respiratory status: spontaneous breathing Cardiovascular status: stable : Nausea last pm treated with Zofran with good relief. Anesthetic complications: no     Last Vitals:  Vitals:   09/18/17 0126 09/18/17 0530  BP: 106/66 116/65  Pulse: (!) 49 (!) 52  Resp: 16 16  Temp: 37.2 C 36.9 C  SpO2: 100% 98%    Last Pain:  Vitals:   09/18/17 0619  TempSrc:   PainSc: 8                  ADAMS, AMY A

## 2017-09-20 LAB — TYPE AND SCREEN
ABO/RH(D): A NEG
Antibody Screen: POSITIVE
DAT, IgG: NEGATIVE
UNIT DIVISION: 0
Unit division: 0

## 2017-09-20 LAB — BPAM RBC
BLOOD PRODUCT EXPIRATION DATE: 201810012359
Blood Product Expiration Date: 201810012359
Unit Type and Rh: 600
Unit Type and Rh: 600

## 2017-09-25 ENCOUNTER — Ambulatory Visit (INDEPENDENT_AMBULATORY_CARE_PROVIDER_SITE_OTHER): Payer: Medicaid Other | Admitting: Obstetrics & Gynecology

## 2017-09-25 ENCOUNTER — Encounter: Payer: Self-pay | Admitting: Obstetrics & Gynecology

## 2017-09-25 VITALS — BP 100/62 | HR 78 | Wt 171.0 lb

## 2017-09-25 DIAGNOSIS — Z9889 Other specified postprocedural states: Secondary | ICD-10-CM

## 2017-09-25 DIAGNOSIS — Z90710 Acquired absence of both cervix and uterus: Secondary | ICD-10-CM

## 2017-09-25 MED ORDER — POLYETHYLENE GLYCOL 3350 17 GM/SCOOP PO POWD: ORAL | 11 refills | Status: DC

## 2017-09-25 MED ORDER — OXYCODONE-ACETAMINOPHEN 5-325 MG PO TABS: 1.0000 | ORAL_TABLET | ORAL | 0 refills | Status: DC | PRN

## 2017-09-25 MED ORDER — KETOROLAC TROMETHAMINE 10 MG PO TABS: 10.0000 mg | ORAL_TABLET | Freq: Three times a day (TID) | ORAL | 0 refills | Status: DC | PRN

## 2017-09-25 NOTE — Progress Notes (Signed)
  HPI: Patient returns for routine postoperative follow-up having undergone TVH anterior repair on 09/17/2017.  The patient's immediate postoperative recovery has been unremarkable. Since hospital discharge the patient reports constipation problems, had some straining and resulting spotting. No spontaneous voiding, self cathing without issue   Current Outpatient Prescriptions: ciprofloxacin (CIPRO) 500 MG tablet, Take 1 tablet (500 mg total) by mouth 2 (two) times daily., Disp: 14 tablet, Rfl: 0 ibuprofen (ADVIL,MOTRIN) 600 MG tablet, Take 800 mg by mouth every 6 (six) hours as needed., Disp: , Rfl:  oxyCODONE-acetaminophen (PERCOCET/ROXICET) 5-325 MG tablet, Take 1-2 tablets by mouth every 4 (four) hours as needed (moderate to severe pain (when tolerating fluids))., Disp: 30 tablet, Rfl: 0 acetaminophen (TYLENOL) 500 MG tablet, Take 1,000 mg by mouth 2 (two) times daily. , Disp: , Rfl:  albuterol (PROVENTIL HFA;VENTOLIN HFA) 108 (90 Base) MCG/ACT inhaler, Inhale 2 puffs into the lungs every 6 (six) hours as needed for wheezing or shortness of breath. , Disp: , Rfl:  ketorolac (TORADOL) 10 MG tablet, Take 1 tablet (10 mg total) by mouth every 8 (eight) hours as needed. (Patient not taking: Reported on 09/25/2017), Disp: 15 tablet, Rfl: 0 metoprolol tartrate (LOPRESSOR) 50 MG tablet, Take 1 twice daily (Patient not taking: Reported on 09/10/2017), Disp: 60 tablet, Rfl: 3 ondansetron (ZOFRAN) 8 MG tablet, Take 1 tablet (8 mg total) by mouth every 6 (six) hours as needed for nausea. (Patient not taking: Reported on 09/25/2017), Disp: 20 tablet, Rfl: 0 Prenatal Vit-Fe Fumarate-FA (MULTIVITAMIN-PRENATAL) 27-0.8 MG TABS tablet, Take 1 tablet by mouth 3 (three) times a week. , Disp: , Rfl:   No current facility-administered medications for this visit.     Blood pressure 100/62, pulse 78, weight 171 lb (77.6 kg), last menstrual period 10/23/2016, currently breastfeeding.  Physical Exam: Cuff is healing  well minimal blood, normal post op  Diagnostic Tests:   Pathology: benign  Impression: S/P  TVH anterior repair for prolapse, BS  Plan: Continue self cath protocol  Follow up: 3  weeks  Lazaro Arms, MD

## 2017-10-16 ENCOUNTER — Encounter: Payer: Self-pay | Admitting: Obstetrics & Gynecology

## 2017-10-16 ENCOUNTER — Ambulatory Visit (INDEPENDENT_AMBULATORY_CARE_PROVIDER_SITE_OTHER): Payer: Medicaid Other | Admitting: Obstetrics & Gynecology

## 2017-10-16 VITALS — BP 100/60 | HR 84 | Wt 170.0 lb

## 2017-10-16 DIAGNOSIS — Z9889 Other specified postprocedural states: Secondary | ICD-10-CM

## 2017-10-16 NOTE — Progress Notes (Signed)
  HPI: Patient returns for routine postoperative follow-up having undergone vaginal hysterectomy with removal of both fallopian tubes, McCall culdoplasty, anterior colporrhaphy with Kelly plication on 09/17/2017.  The patient's immediate postoperative recovery has been unremarkable. Since hospital discharge the patient reports overall doing well, I am somewhat surprised that she has yet to return to significant spontaneous bladder function.  She states it was not unusual for her to have problems with urinary retention in the past and she is only had 1 or 2 episodes of spontaneous voiding since her surgery.  She is without pain and states it doesn't bother her that she is not voiding yet.  She feels like it will come in time.   Current Outpatient Prescriptions: acetaminophen (TYLENOL) 500 MG tablet, Take 1,000 mg by mouth 2 (two) times daily. , Disp: , Rfl:  ibuprofen (ADVIL,MOTRIN) 600 MG tablet, Take 800 mg by mouth every 6 (six) hours as needed., Disp: , Rfl:  ondansetron (ZOFRAN) 8 MG tablet, Take 1 tablet (8 mg total) by mouth every 6 (six) hours as needed for nausea., Disp: 20 tablet, Rfl: 0 polyethylene glycol powder (GLYCOLAX/MIRALAX) powder, 1 scoop daily or as needed, Disp: 255 g, Rfl: 11 albuterol (PROVENTIL HFA;VENTOLIN HFA) 108 (90 Base) MCG/ACT inhaler, Inhale 2 puffs into the lungs every 6 (six) hours as needed for wheezing or shortness of breath. , Disp: , Rfl:  ciprofloxacin (CIPRO) 500 MG tablet, Take 1 tablet (500 mg total) by mouth 2 (two) times daily. (Patient not taking: Reported on 10/16/2017), Disp: 14 tablet, Rfl: 0 ketorolac (TORADOL) 10 MG tablet, Take 1 tablet (10 mg total) by mouth every 8 (eight) hours as needed. (Patient not taking: Reported on 09/25/2017), Disp: 15 tablet, Rfl: 0 ketorolac (TORADOL) 10 MG tablet, Take 1 tablet (10 mg total) by mouth every 8 (eight) hours as needed. (Patient not taking: Reported on 10/16/2017), Disp: 15 tablet, Rfl: 0 metoprolol tartrate  (LOPRESSOR) 50 MG tablet, Take 1 twice daily (Patient not taking: Reported on 09/10/2017), Disp: 60 tablet, Rfl: 3 oxyCODONE-acetaminophen (PERCOCET/ROXICET) 5-325 MG tablet, Take 1-2 tablets by mouth every 4 (four) hours as needed (moderate to severe pain (when tolerating fluids)). (Patient not taking: Reported on 10/16/2017), Disp: 12 tablet, Rfl: 0 Prenatal Vit-Fe Fumarate-FA (MULTIVITAMIN-PRENATAL) 27-0.8 MG TABS tablet, Take 1 tablet by mouth 3 (three) times a week. , Disp: , Rfl:   No current facility-administered medications for this visit.     Blood pressure 100/60, pulse 84, weight 170 lb (77.1 kg), last menstrual period 10/23/2016, currently breastfeeding.  Physical Exam: The suture line of the anterior colporrhaphy looks good There is no breakdown of the tissue there is good support and with straining the bladder neck has good mobility although not excessive The vaginal cuff suture line is also intact and healing normally and it appears that 1 or 2 stitches may be actively coming out Overall the exam is normal and bimanual exam also reveals no midline or adnexal masses  Diagnostic Tests:   Pathology: Benign  Impression: Status post vaginal hysterectomy with removal of both fallopian tubes McCall culdoplasty anterior colporrhaphy with Kelly plication on 09/17/2017  Normal postoperative course with delayed return of spontaneous bladder function after her anterior repair  Plan: Patient will continue to perform appropriate self-catheterization 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

## 2017-10-26 ENCOUNTER — Encounter: Payer: Self-pay | Admitting: Obstetrics & Gynecology

## 2017-10-30 DIAGNOSIS — Z029 Encounter for administrative examinations, unspecified: Secondary | ICD-10-CM

## 2017-11-05 ENCOUNTER — Ambulatory Visit: Payer: Medicaid Other | Admitting: Women's Health

## 2017-11-05 ENCOUNTER — Telehealth: Payer: Self-pay | Admitting: Obstetrics & Gynecology

## 2017-11-06 ENCOUNTER — Telehealth: Payer: Self-pay | Admitting: Obstetrics & Gynecology

## 2017-11-06 MED ORDER — SULFAMETHOXAZOLE-TRIMETHOPRIM 800-160 MG PO TABS: 1.0000 | ORAL_TABLET | Freq: Two times a day (BID) | ORAL | 0 refills | Status: DC

## 2017-11-06 NOTE — Telephone Encounter (Signed)
Informed pt that antibiotic had been sent to her pharmacy.  

## 2017-11-17 ENCOUNTER — Ambulatory Visit: Payer: Medicaid Other | Admitting: Obstetrics & Gynecology

## 2017-11-20 NOTE — Progress Notes (Signed)
Preoperative History and Physical  Sharon Rowland is a 31 y.o. [id number] with No LMP recorded. admitted for a vaginal hysterectomy with removal of Fallopian tubes and anterior colporrhaphy.  The patient actually was having problems with prolapse before this last pregnancy. She was deciding what she wanted to do because of her symptoms and then she sort of inadvertently got pregnant. She delivered 8/20 and since delivery has been having increasing prolapse symptoms, she mostl;y feels her bladder as being the problem.  Exam reveals Grade II uterine and bladder prolapse at rest Grade III with valsalva in the supine position  Additionally she has a long history of dysmenorrhea and menorrhagia but this is secondary to the prolapse. Prior to her prolapse we were considering ablation.  PMH:      Past Medical History:  Diagnosis Date  . Allergic rhinitis   . Anemia   . Asthma    last used inhaler 1 month ago  . Dysrhythmia    SVT  . Female bladder prolapse   . GERD (gastroesophageal reflux disease)    with pregnancy only  . Pelvic pain in antepartum period in second trimester 01/17/2014  . Pregnant 08/01/2015  . PSVT (paroxysmal supraventricular tachycardia) (HCC)    could not be confirmed with 2016 study  . Rectocele   . Urethral prolapse   . Uterine prolapse   . Vaginal Pap smear, abnormal    PSH:       Past Surgical History:  Procedure Laterality Date  . SUPRAVENTRICULAR TACHYCARDIA ABLATION  01/05/2015   unsuccessful  . SUPRAVENTRICULAR TACHYCARDIA ABLATION N/A 01/05/2015   Procedure: SUPRAVENTRICULAR TACHYCARDIA ABLATION; Surgeon: Marinus Maw, MD; Location: [facility] CATH LAB; Service: Cardiovascular; Laterality: N/A;   POb/GynH:          OB History     Gravida Para Term Preterm AB Living   6 6 6   6     SAB TAB Ectopic Multiple Live Births      0 6      SH:      Social History  Substance Use Topics  . Smoking status: Never Smoker  . Smokeless tobacco: Never Used  . Alcohol  use No   FH:       Family History  Problem Relation Age of Onset  . Hypertension Mother   . Hyperlipidemia Mother   . Multiple sclerosis Mother   . COPD Mother   . Cancer Mother    melanoma x 2  . Diabetes Mother   . Heart disease Father    PSVT  . Hyperlipidemia Brother   . Hypertension Brother   . Heart disease Maternal Grandfather    heart attack  . Cancer Paternal Grandmother    pancreatic cancer  . Heart disease Paternal Grandmother    MI  . Hyperlipidemia Paternal Grandmother   . Hypertension Paternal Grandmother   . Asthma Daughter   . Asthma Son   . Autism Son   . ADD / ADHD Son   . Tourette syndrome Son   . Other Son    Neurological and mental issues  . ADD / ADHD Son    Allergies:       Allergies  Allergen Reactions  . Epinephrine Other (See Comments)    Pt states she is "sensitive" to epinephrine.   . Latex Dermatitis   Medications:  Current Facility-Administered Medications:  . ceFAZolin (ANCEF) IVPB 2g/100 mL premix, 2 g, Intravenous, On Call to OR, Lazaro Arms, MD  . lactated ringers  infusion, , Intravenous, Continuous, Laurene FootmanGonzalez, Luis, MD, Last Rate: 75 mL/hr at 09/17/17 [id number]  Review of Systems:  Review of Systems  Constitutional: Negative for fever, chills, weight loss, malaise/fatigue and diaphoresis.  HENT: Negative for hearing loss, ear pain, nosebleeds, congestion, sore throat, neck pain, tinnitus and ear discharge.  Eyes: Negative for blurred vision, double vision, photophobia, pain, discharge and redness.  Respiratory: Negative for cough, hemoptysis, sputum production, shortness of breath, wheezing and stridor.  Cardiovascular: Negative for chest pain, palpitations, orthopnea, claudication, leg swelling and PND.  Gastrointestinal: Positive for abdominal pain. Negative for heartburn, nausea, vomiting, diarrhea, constipation, blood in stool and melena.  Genitourinary: Negative for dysuria, urgency, frequency, hematuria and flank pain.   Musculoskeletal: Negative for myalgias, back pain, joint pain and falls.  Skin: Negative for itching and rash.  Neurological: Negative for dizziness, tingling, tremors, sensory change, speech change, focal weakness, seizures, loss of consciousness, weakness and headaches.  Endo/Heme/Allergies: Negative for environmental allergies and polydipsia. Does not bruise/bleed easily.  Psychiatric/Behavioral: Negative for depression, suicidal ideas, hallucinations, memory loss and substance abuse. The patient is not nervous/anxious and does not have insomnia.  PHYSICAL EXAM:  Blood pressure 108/62, pulse 71, temperature 98.3 F (36.8 C), temperature source Oral, resp. rate 14, height 5\' 4"  (1.626 m), weight 173 lb (78.5 kg), SpO2 98 %, currently breastfeeding.  Vitals reviewed.  Constitutional: She is oriented to person, place, and time. She appears well-developed and well-nourished.  HENT:  Head: Normocephalic and atraumatic.  Right Ear: External ear normal.  Left Ear: External ear normal.  Nose: Nose normal.  Mouth/Throat: Oropharynx is clear and moist.  Eyes: Conjunctivae and EOM are normal. Pupils are equal, round, and reactive to light. Right eye exhibits no discharge. Left eye exhibits no discharge. No scleral icterus.  Neck: Normal range of motion. Neck supple. No tracheal deviation present. No thyromegaly present.  Cardiovascular: Normal rate, regular rhythm, normal heart sounds and intact distal pulses. Exam reveals no gallop and no friction rub.  No murmur heard.  Respiratory: Effort normal and breath sounds normal. No respiratory distress. She has no wheezes. She has no rales. She exhibits no tenderness.  GI: Soft. Bowel sounds are normal. She exhibits no distension and no mass. There is tenderness. There is no rebound and no guarding.  Genitourinary:  Vulva is normal without lesions Vagina is pink moist without discharge, Grade II bladder prolapse at rest Cervix normal in appearance and  pap is normal Uterus is normal size, contour, position, consistency, mobility, non-tender with Grade II prolapse Adnexa is negative with normal sized ovaries by sonogram  Musculoskeletal: Normal range of motion. She exhibits no edema and no tenderness.  Neurological: She is alert and oriented to person, place, and time. She has normal reflexes. She displays normal reflexes. No cranial nerve deficit. She exhibits normal muscle tone. Coordination normal.  Skin: Skin is warm and dry. No rash noted. No erythema. No pallor.  Psychiatric: She has a normal mood and affect. Her behavior is normal. Judgment and thought content normal.  Labs:        Results for orders placed or performed during the hospital encounter of 09/15/17 (from the past 336 hour(s))  Surgical pcr screen   Collection Time: 09/15/17 1:28 PM  Result Value Ref Range   MRSA, PCR NEGATIVE NEGATIVE   Staphylococcus aureus NEGATIVE NEGATIVE  CBC   Collection Time: 09/15/17 1:28 PM  Result Value Ref Range   WBC 6.6 4.0 - 10.5 K/uL   RBC 4.55 3.87 -  5.11 MIL/uL   Hemoglobin 11.2 (L) 12.0 - 15.0 g/dL   HCT 16.1 (L) 09.6 - 04.5 %   MCV 78.7 78.0 - 100.0 fL   MCH 24.6 (L) 26.0 - 34.0 pg   MCHC 31.3 30.0 - 36.0 g/dL   RDW 40.9 (H) 81.1 - 91.4 %   Platelets 204 150 - 400 K/uL  Comprehensive metabolic panel   Collection Time: 09/15/17 1:28 PM  Result Value Ref Range   Sodium 140 135 - 145 mmol/L   Potassium 3.9 3.5 - 5.1 mmol/L   Chloride 104 101 - 111 mmol/L   CO2 26 22 - 32 mmol/L   Glucose, Bld 71 65 - 99 mg/dL   BUN 8 6 - 20 mg/dL   Creatinine, Ser 7.82 0.44 - 1.00 mg/dL   Calcium 9.1 8.9 - 95.6 mg/dL   Total Protein 7.4 6.5 - 8.1 g/dL   Albumin 4.2 3.5 - 5.0 g/dL   AST 14 (L) 15 - 41 U/L   ALT 23 14 - 54 U/L   Alkaline Phosphatase 81 38 - 126 U/L   Total Bilirubin 0.4 0.3 - 1.2 mg/dL   GFR calc non Af Amer >60 >60 mL/min   GFR calc Af Amer >60 >60 mL/min   Anion gap 10 5 - 15  hCG, quantitative, pregnancy    Collection Time: 09/15/17 1:28 PM  Result Value Ref Range   hCG, Beta Chain, Quant, S 1 <5 mIU/mL  Rapid HIV screen (HIV 1/2 Ab+Ag)   Collection Time: 09/15/17 1:28 PM  Result Value Ref Range   HIV-1 P24 Antigen - HIV24 NON REACTIVE NON REACTIVE   HIV 1/2 Antibodies NON REACTIVE NON REACTIVE   Interpretation (HIV Ag Ab)      A non reactive test result means that HIV 1 or HIV 2 antibodies and HIV 1 p24 antigen were not detected in the specimen.  Urinalysis, Routine w reflex microscopic   Collection Time: 09/15/17 1:28 PM  Result Value Ref Range   Color, Urine STRAW (A) YELLOW   APPearance CLEAR CLEAR   Specific Gravity, Urine 1.004 (L) 1.005 - 1.030   pH 6.0 5.0 - 8.0   Glucose, UA NEGATIVE NEGATIVE mg/dL   Hgb urine dipstick SMALL (A) NEGATIVE   Bilirubin Urine NEGATIVE NEGATIVE   Ketones, ur NEGATIVE NEGATIVE mg/dL   Protein, ur NEGATIVE NEGATIVE mg/dL   Nitrite NEGATIVE NEGATIVE   Leukocytes, UA TRACE (A) NEGATIVE   RBC / HPF 0-5 0 - 5 RBC/hpf   WBC, UA 0-5 0 - 5 WBC/hpf   Bacteria, UA NONE SEEN NONE SEEN   Squamous Epithelial / LPF 0-5 (A) NONE SEEN  Type and screen   Collection Time: 09/15/17 1:28 PM  Result Value Ref Range   ABO/RH(D) A NEG    Antibody Screen POS    Sample Expiration 09/18/2017    Antibody Identification      PASSIVELY ACQUIRED ANTI-D  Performed at [facility], [address], [place], [place] [address]    DAT, IgG NEG    Unit Number [id number]    Blood Component Type RED CELLS,LR    Unit division 00    Status of Unit ALLOCATED    Transfusion Status OK TO TRANSFUSE    Crossmatch Result COMPATIBLE    Unit Number [id number]    Blood Component Type RBC LR PHER2    Unit division 00    Status of Unit ALLOCATED    Transfusion Status OK TO TRANSFUSE  Crossmatch Result COMPATIBLE   BPAM RBC   Collection Time: 09/15/17 1:28 PM  Result Value Ref Range   Blood Product Unit Number [id number]    Unit Type and Rh 0600    Blood Product  Expiration Date 562130865784    Blood Product Unit Number [id number]    Unit Type and Rh 0600    Blood Product Expiration Date 401027253664   Results for orders placed or performed in visit on 09/10/17 (from the past 336 hour(s))  CBC   Collection Time: 09/10/17 4:43 PM  Result Value Ref Range   WBC 6.4 3.4 - 10.8 x10E3/uL   RBC 4.46 3.77 - 5.28 x10E6/uL   Hemoglobin 10.7 (L) 11.1 - 15.9 g/dL   Hematocrit 40.3 (L) 47.4 - 46.6 %   MCV 75 (L) 79 - 97 fL   MCH 24.0 (L) 26.6 - 33.0 pg   MCHC 31.9 31.5 - 35.7 g/dL   RDW 25.9 (H) 56.3 - 87.5 %   Platelets 206 150 - 379 x10E3/uL  Comprehensive metabolic panel   Collection Time: 09/10/17 4:43 PM  Result Value Ref Range   Glucose 86 65 - 99 mg/dL   BUN 10 6 - 20 mg/dL   Creatinine, Ser 6.43 0.57 - 1.00 mg/dL   GFR calc non Af Amer 118 >59 mL/min/1.73   GFR calc Af Amer 136 >59 mL/min/1.73   BUN/Creatinine Ratio 15 9 - 23   Sodium 141 134 - 144 mmol/L   Potassium 4.0 3.5 - 5.2 mmol/L   Chloride 104 96 - 106 mmol/L   CO2 22 20 - 29 mmol/L   Calcium 9.1 8.7 - 10.2 mg/dL   Total Protein 6.8 6.0 - 8.5 g/dL   Albumin 4.5 3.5 - 5.5 g/dL   Globulin, Total 2.3 1.5 - 4.5 g/dL   Albumin/Globulin Ratio 2.0 1.2 - 2.2   Bilirubin Total 0.4 0.0 - 1.2 mg/dL   Alkaline Phosphatase 87 39 - 117 IU/L   AST 6 0 - 40 IU/L   ALT 17 0 - 32 IU/L  TSH   Collection Time: 09/10/17 4:43 PM  Result Value Ref Range   TSH 1.760 0.450 - 4.500 uIU/mL   EKG:     Orders placed or performed during the hospital encounter of 09/15/17  . EKG 12-Lead  . EKG 12-Lead   Imaging Studies:  Imaging Results     Assessment:  Grade II uterine and anterior compartment prolapse, rectum is well supported at this time  Dysmenorrhea  Menorrhagia      Patient Active Problem List   Diagnosis Date Noted  . Normal labor 08/10/2017  . Uterine/bladder prolapse 02/13/2017  . History of recurrent UTI (urinary tract infection) 02/27/2016  . Migraines 10/03/2015  .  Supervision of normal pregnancy 09/05/2015  . Trigger point of shoulder region 06/20/2015  . Rh negative state in antepartum period 01/31/2014  . Abnormal Pap smear of cervix 01/31/2014  . Paroxysmal SVT (supraventricular tachycardia) (HCC) 11/09/2013  . Asthma, stable 11/09/2013   Plan:  TVH, removal of tubes, prophylactically, and anterior colporrhaphy  Pt understands the risks of surgery including but not limited t excessive bleeding requiring transfusion or reoperation, post-operative infection requiring prolonged hospitalization or re-hospitalization and antibiotic therapy, and damage to other organs including bladder, bowel, ureters and major vessels. The patient also understands the alternative treatment options which were discussed in full. All questions were answered.

## 2017-11-29 ENCOUNTER — Telehealth: Payer: Medicaid Other | Admitting: Nurse Practitioner

## 2017-11-29 DIAGNOSIS — N3000 Acute cystitis without hematuria: Secondary | ICD-10-CM

## 2017-11-29 MED ORDER — CIPROFLOXACIN HCL 500 MG PO TABS: 500.0000 mg | ORAL_TABLET | Freq: Two times a day (BID) | ORAL | 0 refills | Status: DC

## 2017-11-29 NOTE — Progress Notes (Signed)

## 2018-01-23 ENCOUNTER — Encounter: Payer: Self-pay | Admitting: Obstetrics & Gynecology

## 2018-02-03 ENCOUNTER — Ambulatory Visit: Payer: Medicaid Other | Admitting: Obstetrics & Gynecology

## 2018-02-16 ENCOUNTER — Ambulatory Visit (INDEPENDENT_AMBULATORY_CARE_PROVIDER_SITE_OTHER): Payer: Medicaid Other | Admitting: Obstetrics & Gynecology

## 2018-02-16 ENCOUNTER — Encounter: Payer: Self-pay | Admitting: Obstetrics & Gynecology

## 2018-02-16 DIAGNOSIS — R309 Painful micturition, unspecified: Secondary | ICD-10-CM

## 2018-02-16 DIAGNOSIS — R300 Dysuria: Secondary | ICD-10-CM

## 2018-02-16 LAB — POCT URINALYSIS DIPSTICK
Glucose, UA: NEGATIVE
KETONES UA: NEGATIVE
Nitrite, UA: NEGATIVE
PROTEIN UA: NEGATIVE

## 2018-02-16 MED ORDER — CIPROFLOXACIN HCL 500 MG PO TABS: 500.0000 mg | ORAL_TABLET | Freq: Two times a day (BID) | ORAL | 0 refills | Status: DC

## 2018-02-16 MED ORDER — LIDOCAINE HCL 2 % EX GEL
1.0000 | CUTANEOUS | 11 refills | Status: DC | PRN
Start: 2018-02-16 — End: 2019-03-04

## 2018-02-16 MED ORDER — DOXAZOSIN MESYLATE 1 MG PO TABS
1.0000 mg | ORAL_TABLET | Freq: Every day | ORAL | 1 refills | Status: DC
Start: 2018-02-16 — End: 2018-07-13

## 2018-02-16 MED ORDER — METRONIDAZOLE 0.75 % VA GEL: VAGINAL | 0 refills | Status: DC

## 2018-02-16 NOTE — Progress Notes (Signed)
Chief Complaint  Patient presents with  . Dysuria      32 y.o. [id number] Patient's last menstrual period was 10/23/2016 (approximate). The current method of family planning is status post hysterectomy.  Outpatient Encounter Medications as of 02/16/2018  Medication Sig  . acetaminophen (TYLENOL) 500 MG tablet Take 1,000 mg by mouth 2 (two) times daily.   Marland Kitchen albuterol (PROVENTIL HFA;VENTOLIN HFA) 108 (90 Base) MCG/ACT inhaler Inhale 2 puffs into the lungs every 6 (six) hours as needed for wheezing or shortness of breath.   Marland Kitchen ibuprofen (ADVIL,MOTRIN) 600 MG tablet Take 800 mg by mouth every 6 (six) hours as needed.  . Prenatal Vit-Fe Fumarate-FA (MULTIVITAMIN-PRENATAL) 27-0.8 MG TABS tablet Take 1 tablet by mouth 3 (three) times a week.   . lidocaine (XYLOCAINE) 2 % jelly Place 1 application into the urethra as needed. (Patient not taking: Reported on 07/13/2018)  . [DISCONTINUED] ciprofloxacin (CIPRO) 500 MG tablet Take 1 tablet (500 mg total) by mouth 2 (two) times daily.  . [DISCONTINUED] ciprofloxacin (CIPRO) 500 MG tablet Take 1 tablet (500 mg total) by mouth 2 (two) times daily.  . [DISCONTINUED] doxazosin (CARDURA) 1 MG tablet Take 1 tablet (1 mg total) by mouth daily.  . [DISCONTINUED] ketorolac (TORADOL) 10 MG tablet Take 1 tablet (10 mg total) by mouth every 8 (eight) hours as needed. (Patient not taking: Reported on 09/25/2017)  . [DISCONTINUED] ketorolac (TORADOL) 10 MG tablet Take 1 tablet (10 mg total) by mouth every 8 (eight) hours as needed. (Patient not taking: Reported on 10/16/2017)  . [DISCONTINUED] metoprolol tartrate (LOPRESSOR) 50 MG tablet Take 1 twice daily (Patient not taking: Reported on 09/10/2017)  . [DISCONTINUED] metroNIDAZOLE (METROGEL VAGINAL) 0.75 % vaginal gel Nightly x 5 nights  . [DISCONTINUED] ondansetron (ZOFRAN) 8 MG tablet Take 1 tablet (8 mg total) by mouth every 6 (six) hours as needed for nausea.  . [DISCONTINUED] oxyCODONE-acetaminophen  (PERCOCET/ROXICET) 5-325 MG tablet Take 1-2 tablets by mouth every 4 (four) hours as needed (moderate to severe pain (when tolerating fluids)). (Patient not taking: Reported on 10/16/2017)  . [DISCONTINUED] polyethylene glycol powder (GLYCOLAX/MIRALAX) powder 1 scoop daily or as needed  . [DISCONTINUED] sulfamethoxazole-trimethoprim (BACTRIM DS,SEPTRA DS) 800-160 MG tablet Take 1 tablet 2 (two) times daily by mouth.   No facility-administered encounter medications on file as of 02/16/2018.     Subjective 5 months post op from [facility] with VV suspension + anterior colporrhaphy Ongoing bladder dysfunction Has a long history of a "difficult bladder" Occasionally self caths still Some OAB symptoms Double voids Sex is no longer painful Past Medical History:  Diagnosis Date  . Allergic rhinitis   . Anemia   . Asthma    last used inhaler 1 month ago  . Dysrhythmia    SVT  . Female bladder prolapse   . GERD (gastroesophageal reflux disease)    with pregnancy only  . Pelvic pain in antepartum period in second trimester 01/17/2014  . Pregnant 08/01/2015  . PSVT (paroxysmal supraventricular tachycardia) (HCC)    could not be confirmed with 2016 study  . Rectocele   . Urethral prolapse   . Uterine prolapse   . Vaginal Pap smear, abnormal     Past Surgical History:  Procedure Laterality Date  . CYSTOCELE REPAIR N/A 09/17/2017   Procedure: ANTERIOR REPAIR (CYSTOCELE);  Surgeon: Lazaro Arms, MD;  Location: [facility];  Service: Gynecology;  Laterality: N/A;  . SUPRAVENTRICULAR TACHYCARDIA ABLATION  01/05/2015   unsuccessful  . SUPRAVENTRICULAR  TACHYCARDIA ABLATION N/A 01/05/2015   Procedure: SUPRAVENTRICULAR TACHYCARDIA ABLATION;  Surgeon: Marinus Maw, MD;  Location: [facility] CATH LAB;  Service: Cardiovascular;  Laterality: N/A;  . VAGINAL HYSTERECTOMY N/A 09/17/2017   Procedure: HYSTERECTOMY VAGINAL WITH POSSIBLE BILATERAL SALPINGECTOMY;  Surgeon: Lazaro Arms, MD;  Location: [facility];  Service:  Gynecology;  Laterality: N/A;    OB History    Gravida  6   Para  6   Term  6   Preterm      AB      Living  6     SAB      TAB      Ectopic      Multiple  0   Live Births  6           Allergies  Allergen Reactions  . Epinephrine Other (See Comments)    Pt states she is "sensitive" to epinephrine.   . Latex Dermatitis    Social History   Socioeconomic History  . Marital status: Married    Spouse name: Not on file  . Number of children: Not on file  . Years of education: Not on file  . Highest education level: Not on file  Occupational History  . Not on file  Social Needs  . Financial resource strain: Not on file  . Food insecurity:    Worry: Not on file    Inability: Not on file  . Transportation needs:    Medical: Not on file    Non-medical: Not on file  Tobacco Use  . Smoking status: Never Smoker  . Smokeless tobacco: Never Used  Substance and Sexual Activity  . Alcohol use: No  . Drug use: No  . Sexual activity: Yes    Birth control/protection: Surgical    Comment: hyst  Lifestyle  . Physical activity:    Days per week: Not on file    Minutes per session: Not on file  . Stress: Not on file  Relationships  . Social connections:    Talks on phone: Not on file    Gets together: Not on file    Attends religious service: Not on file    Active member of club or organization: Not on file    Attends meetings of clubs or organizations: Not on file    Relationship status: Not on file  Other Topics Concern  . Not on file  Social History Narrative  . Not on file    Family History  Problem Relation Age of Onset  . Hypertension Mother   . Hyperlipidemia Mother   . Multiple sclerosis Mother   . COPD Mother   . Cancer Mother        melanoma x 2  . Diabetes Mother   . Heart disease Father        PSVT  . Hyperlipidemia Brother   . Hypertension Brother   . Heart disease Maternal Grandfather        heart attack  . Cancer Paternal  Grandmother        pancreatic cancer  . Heart disease Paternal Grandmother        MI  . Hyperlipidemia Paternal Grandmother   . Hypertension Paternal Grandmother   . Asthma Daughter   . Asthma Son   . Autism Son   . ADD / ADHD Son   . Tourette syndrome Son   . Other Son        Neurological and mental issues  . ADD / ADHD  Son     Medications:       Current Outpatient Medications:  .  acetaminophen (TYLENOL) 500 MG tablet, Take 1,000 mg by mouth 2 (two) times daily. , Disp: , Rfl:  .  albuterol (PROVENTIL HFA;VENTOLIN HFA) 108 (90 Base) MCG/ACT inhaler, Inhale 2 puffs into the lungs every 6 (six) hours as needed for wheezing or shortness of breath. , Disp: , Rfl:  .  ibuprofen (ADVIL,MOTRIN) 600 MG tablet, Take 800 mg by mouth every 6 (six) hours as needed., Disp: , Rfl:  .  Prenatal Vit-Fe Fumarate-FA (MULTIVITAMIN-PRENATAL) 27-0.8 MG TABS tablet, Take 1 tablet by mouth 3 (three) times a week. , Disp: , Rfl:  .  lidocaine (XYLOCAINE) 2 % jelly, Place 1 application into the urethra as needed. (Patient not taking: Reported on 07/13/2018), Disp: 30 mL, Rfl: 11 .  nitrofurantoin, macrocrystal-monohydrate, (MACROBID) 100 MG capsule, Take 1 tablet nightly, Disp: 30 capsule, Rfl: 1 .  topiramate (TOPAMAX) 50 MG tablet, Take 0.5 tablets (25 mg total) by mouth 2 (two) times daily., Disp: 60 tablet, Rfl: 11  Objective Blood pressure 112/70, pulse 70, height 5\' 4"  (1.626 m), weight 170 lb 8 oz (77.3 kg), last menstrual period 10/23/2016, currently breastfeeding.  General WDWN female NAD Vulva:  normal appearing vulva with no masses, tenderness or lesions Vagina:  normal mucosa, no discharge excellent apical support, excellent bladder support Cervix:  absent Uterus:  uterus absent Adnexa: ovaries:present,  normal adnexa in size, nontender and no masses   Pertinent ROS Per HPI No burning with urination, frequency or urgency No nausea, vomiting or diarrhea Nor fever chills or other  constitutional symptoms   Labs or studies     Impression Diagnoses this Encounter::   ICD-10-CM   1. Pain with urination R30.9 POCT urinalysis dipstick    Urine Culture  2. Burning with urination R30.0 POCT urinalysis dipstick    Urine Culture    Established relevant diagnosis(es): S/p TVH VV + anterior colporrhaphy  Plan/Recommendations: Meds ordered this encounter  Medications  . DISCONTD: metroNIDAZOLE (METROGEL VAGINAL) 0.75 % vaginal gel    Sig: Nightly x 5 nights    Dispense:  70 g    Refill:  0  . DISCONTD: doxazosin (CARDURA) 1 MG tablet    Sig: Take 1 tablet (1 mg total) by mouth daily.    Dispense:  30 tablet    Refill:  1  . DISCONTD: ciprofloxacin (CIPRO) 500 MG tablet    Sig: Take 1 tablet (500 mg total) by mouth 2 (two) times daily.    Dispense:  14 tablet    Refill:  0  . lidocaine (XYLOCAINE) 2 % jelly    Sig: Place 1 application into the urethra as needed.    Dispense:  30 mL    Refill:  11    Labs or Scans Ordered: Orders Placed This Encounter  Procedures  . Urine Culture  . POCT urinalysis dipstick    Management:: Discussed on going self cath and strategies to improve her symptoms Consider PT in future Mother similar issues   Follow up Return if symptoms worsen or fail to improve.       All questions were answered.

## 2018-02-18 LAB — URINE CULTURE

## 2018-03-29 ENCOUNTER — Encounter: Payer: Self-pay | Admitting: Obstetrics & Gynecology

## 2018-03-31 ENCOUNTER — Other Ambulatory Visit: Payer: Self-pay | Admitting: Obstetrics & Gynecology

## 2018-03-31 DIAGNOSIS — Z029 Encounter for administrative examinations, unspecified: Secondary | ICD-10-CM

## 2018-03-31 MED ORDER — NITROFURANTOIN MONOHYD MACRO 100 MG PO CAPS: ORAL_CAPSULE | ORAL | 1 refills | Status: DC

## 2018-04-01 ENCOUNTER — Other Ambulatory Visit: Payer: Self-pay | Admitting: Obstetrics & Gynecology

## 2018-04-01 MED ORDER — K-Y JELLY EX GEL: 1.0000 | CUTANEOUS | 11 refills | Status: DC | PRN

## 2018-05-19 ENCOUNTER — Ambulatory Visit: Payer: Medicaid Other | Admitting: Obstetrics & Gynecology

## 2018-07-13 ENCOUNTER — Encounter: Payer: Self-pay | Admitting: Obstetrics & Gynecology

## 2018-07-13 ENCOUNTER — Ambulatory Visit (INDEPENDENT_AMBULATORY_CARE_PROVIDER_SITE_OTHER): Payer: Self-pay | Admitting: Obstetrics & Gynecology

## 2018-07-13 ENCOUNTER — Other Ambulatory Visit: Payer: Self-pay

## 2018-07-13 VITALS — BP 118/80 | HR 56 | Ht 64.0 in | Wt 170.0 lb

## 2018-07-13 DIAGNOSIS — R29818 Other symptoms and signs involving the nervous system: Secondary | ICD-10-CM

## 2018-07-13 DIAGNOSIS — N319 Neuromuscular dysfunction of bladder, unspecified: Secondary | ICD-10-CM

## 2018-07-13 MED ORDER — TOPIRAMATE 25 MG PO TABS: 25.0000 mg | ORAL_TABLET | Freq: Two times a day (BID) | ORAL | 11 refills | Status: DC

## 2018-07-13 NOTE — Progress Notes (Signed)
Chief Complaint  Patient presents with  . Routine Post Op      32 y.o. [id number] Patient's last menstrual period was 10/23/2016 (approximate). The current method of family planning is status post hysterectomy.  Outpatient Encounter Medications as of 07/13/2018  Medication Sig  . acetaminophen (TYLENOL) 500 MG tablet Take 1,000 mg by mouth 2 (two) times daily.   Marland Kitchen albuterol (PROVENTIL HFA;VENTOLIN HFA) 108 (90 Base) MCG/ACT inhaler Inhale 2 puffs into the lungs every 6 (six) hours as needed for wheezing or shortness of breath.   Marland Kitchen ibuprofen (ADVIL,MOTRIN) 600 MG tablet Take 800 mg by mouth every 6 (six) hours as needed.  . nitrofurantoin, macrocrystal-monohydrate, (MACROBID) 100 MG capsule Take 1 tablet nightly  . lidocaine (XYLOCAINE) 2 % jelly Place 1 application into the urethra as needed. (Patient not taking: Reported on 07/13/2018)  . Prenatal Vit-Fe Fumarate-FA (MULTIVITAMIN-PRENATAL) 27-0.8 MG TABS tablet Take 1 tablet by mouth 3 (three) times a week.   . topiramate (TOPAMAX) 25 MG tablet Take 1 tablet (25 mg total) by mouth 2 (two) times daily.  . [DISCONTINUED] ciprofloxacin (CIPRO) 500 MG tablet Take 1 tablet (500 mg total) by mouth 2 (two) times daily.  . [DISCONTINUED] doxazosin (CARDURA) 1 MG tablet Take 1 tablet (1 mg total) by mouth daily.  . [DISCONTINUED] Lubricants (K-Y JELLY) GEL Apply 1 packet topically as needed. (Patient not taking: Reported on 07/13/2018)  . [DISCONTINUED] metroNIDAZOLE (METROGEL VAGINAL) 0.75 % vaginal gel Nightly x 5 nights   No facility-administered encounter medications on file as of 07/13/2018.     Subjective Sharon Rowland is s/p TVH vaginal vault suspension and cysotcoele repair 08/2017 who has had glacial type return of bladder function and of course it is still not perfect Occasional urinary retention and UTI symptoms persist Much better but not completely normal Past Medical History:  Diagnosis Date  . Allergic rhinitis   . Anemia    . Asthma    last used inhaler 1 month ago  . Dysrhythmia    SVT  . Female bladder prolapse   . GERD (gastroesophageal reflux disease)    with pregnancy only  . Pelvic pain in antepartum period in second trimester 01/17/2014  . Pregnant 08/01/2015  . PSVT (paroxysmal supraventricular tachycardia) (HCC)    could not be confirmed with 2016 study  . Rectocele   . Urethral prolapse   . Uterine prolapse   . Vaginal Pap smear, abnormal     Past Surgical History:  Procedure Laterality Date  . CYSTOCELE REPAIR N/A 09/17/2017   Procedure: ANTERIOR REPAIR (CYSTOCELE);  Surgeon: Lazaro Arms, MD;  Location: [facility];  Service: Gynecology;  Laterality: N/A;  . SUPRAVENTRICULAR TACHYCARDIA ABLATION  01/05/2015   unsuccessful  . SUPRAVENTRICULAR TACHYCARDIA ABLATION N/A 01/05/2015   Procedure: SUPRAVENTRICULAR TACHYCARDIA ABLATION;  Surgeon: Marinus Maw, MD;  Location: [facility] CATH LAB;  Service: Cardiovascular;  Laterality: N/A;  . VAGINAL HYSTERECTOMY N/A 09/17/2017   Procedure: HYSTERECTOMY VAGINAL WITH POSSIBLE BILATERAL SALPINGECTOMY;  Surgeon: Lazaro Arms, MD;  Location: [facility];  Service: Gynecology;  Laterality: N/A;    OB History    Gravida  6   Para  6   Term  6   Preterm      AB      Living  6     SAB      TAB      Ectopic      Multiple  0   Live Births  6  Allergies  Allergen Reactions  . Epinephrine Other (See Comments)    Pt states she is "sensitive" to epinephrine.   . Latex Dermatitis    Social History   Socioeconomic History  . Marital status: Married    Spouse name: Not on file  . Number of children: Not on file  . Years of education: Not on file  . Highest education level: Not on file  Occupational History  . Not on file  Social Needs  . Financial resource strain: Not on file  . Food insecurity:    Worry: Not on file    Inability: Not on file  . Transportation needs:    Medical: Not on file    Non-medical: Not on file  Tobacco  Use  . Smoking status: Never Smoker  . Smokeless tobacco: Never Used  Substance and Sexual Activity  . Alcohol use: No  . Drug use: No  . Sexual activity: Yes    Birth control/protection: Surgical    Comment: hyst  Lifestyle  . Physical activity:    Days per week: Not on file    Minutes per session: Not on file  . Stress: Not on file  Relationships  . Social connections:    Talks on phone: Not on file    Gets together: Not on file    Attends religious service: Not on file    Active member of club or organization: Not on file    Attends meetings of clubs or organizations: Not on file    Relationship status: Not on file  Other Topics Concern  . Not on file  Social History Narrative  . Not on file    Family History  Problem Relation Age of Onset  . Hypertension Mother   . Hyperlipidemia Mother   . Multiple sclerosis Mother   . COPD Mother   . Cancer Mother        melanoma x 2  . Diabetes Mother   . Heart disease Father        PSVT  . Hyperlipidemia Brother   . Hypertension Brother   . Heart disease Maternal Grandfather        heart attack  . Cancer Paternal Grandmother        pancreatic cancer  . Heart disease Paternal Grandmother        MI  . Hyperlipidemia Paternal Grandmother   . Hypertension Paternal Grandmother   . Asthma Daughter   . Asthma Son   . Autism Son   . ADD / ADHD Son   . Tourette syndrome Son   . Other Son        Neurological and mental issues  . ADD / ADHD Son     Medications:       Current Outpatient Medications:  .  acetaminophen (TYLENOL) 500 MG tablet, Take 1,000 mg by mouth 2 (two) times daily. , Disp: , Rfl:  .  albuterol (PROVENTIL HFA;VENTOLIN HFA) 108 (90 Base) MCG/ACT inhaler, Inhale 2 puffs into the lungs every 6 (six) hours as needed for wheezing or shortness of breath. , Disp: , Rfl:  .  ibuprofen (ADVIL,MOTRIN) 600 MG tablet, Take 800 mg by mouth every 6 (six) hours as needed., Disp: , Rfl:  .  nitrofurantoin,  macrocrystal-monohydrate, (MACROBID) 100 MG capsule, Take 1 tablet nightly, Disp: 30 capsule, Rfl: 1 .  lidocaine (XYLOCAINE) 2 % jelly, Place 1 application into the urethra as needed. (Patient not taking: Reported on 07/13/2018), Disp: 30 mL, Rfl: 11 .  Prenatal Vit-Fe Fumarate-FA (MULTIVITAMIN-PRENATAL) 27-0.8 MG TABS tablet, Take 1 tablet by mouth 3 (three) times a week. , Disp: , Rfl:  .  topiramate (TOPAMAX) 25 MG tablet, Take 1 tablet (25 mg total) by mouth 2 (two) times daily., Disp: 60 tablet, Rfl: 11  Objective Blood pressure 118/80, pulse (!) 56, height 5\' 4"  (1.626 m), weight 170 lb (77.1 kg), last menstrual period 10/23/2016, currently breastfeeding.  General WDWN female NAD Vulva:  normal appearing vulva with no masses, tenderness or lesions Vagina:  Cuff intact excellent apex suspension and bladder support Cervix:  absent Uterus:  uterus absent Adnexa: ovaries:present,  normal adnexa in size, nontender and no masses   Pertinent ROS No burning with urination, frequency or urgency No nausea, vomiting or diarrhea Nor fever chills or other constitutional symptoms   Labs or studies No new    Impression Diagnoses this Encounter::   ICD-10-CM   1. Neurogenic bladder N31.9   2. Other symptoms and signs involving the nervous system R29.818 MR BRAIN W CONTRAST    Established relevant diagnosis(es):   Plan/Recommendations: Meds ordered this encounter  Medications  . topiramate (TOPAMAX) 25 MG tablet    Sig: Take 1 tablet (25 mg total) by mouth 2 (two) times daily.    Dispense:  60 tablet    Refill:  11    Labs or Scans Ordered: Orders Placed This Encounter  Procedures  . MR BRAIN W CONTRAST    Management:: Continue to manage her symptoms with self cath if needed, antibiotics if UTI symptoms present, pt is very aware of how to manage her symptom complex  Mother with MS, will order brain MRI to evaluate patient's neurological symptoms in light of her family  history  Follow up Return if symptoms worsen or fail to improve.       All questions were answered.

## 2018-08-03 ENCOUNTER — Other Ambulatory Visit: Payer: Self-pay | Admitting: Obstetrics & Gynecology

## 2018-08-03 MED ORDER — TOPIRAMATE 50 MG PO TABS: 25.0000 mg | ORAL_TABLET | Freq: Two times a day (BID) | ORAL | 11 refills | Status: DC

## 2018-08-06 ENCOUNTER — Ambulatory Visit (HOSPITAL_COMMUNITY)
Admission: RE | Admit: 2018-08-06 | Discharge: 2018-08-06 | Disposition: A | Discharge status: Home / Self Care | Payer: Self-pay | Source: Ambulatory Visit | Attending: Obstetrics & Gynecology | Admitting: Obstetrics & Gynecology

## 2018-08-06 DIAGNOSIS — R29818 Other symptoms and signs involving the nervous system: Secondary | ICD-10-CM | POA: Insufficient documentation

## 2018-08-06 DIAGNOSIS — Z820 Family history of epilepsy and other diseases of the nervous system: Secondary | ICD-10-CM | POA: Insufficient documentation

## 2018-08-06 MED ORDER — GADOBENATE DIMEGLUMINE 529 MG/ML IV SOLN
15.0000 mL | Freq: Once | INTRAVENOUS | Status: AC | PRN
  Administered 2018-08-06: 15 mL via INTRAVENOUS

## 2018-09-28 ENCOUNTER — Telehealth: Payer: Self-pay | Admitting: *Deleted

## 2018-09-28 ENCOUNTER — Telehealth: Payer: Self-pay | Admitting: Obstetrics & Gynecology

## 2018-09-28 MED ORDER — SELF-CATH SYSTEM STRAIGHT TIP KIT: 1.0000 | PACK | 11 refills | Status: DC | PRN

## 2018-09-28 NOTE — Telephone Encounter (Signed)
Meds ordered this encounter  Medications  . Catheters (SELF-CATH SYSTEM STRAIGHT TIP) KIT    Sig: 1 Device by Does not apply route as needed.    Dispense:  50 each    Refill:  11    7 [place] please

## 2018-09-28 NOTE — Telephone Encounter (Signed)
Thumbs up emoji

## 2018-09-28 NOTE — Telephone Encounter (Signed)
LMOVM that Rx for catheters had been sent to her pharmacy.

## 2018-11-13 ENCOUNTER — Ambulatory Visit: Payer: Medicaid Other | Admitting: Adult Health

## 2018-11-25 ENCOUNTER — Encounter: Payer: Self-pay | Admitting: Adult Health

## 2018-11-25 ENCOUNTER — Ambulatory Visit (INDEPENDENT_AMBULATORY_CARE_PROVIDER_SITE_OTHER): Payer: Self-pay | Admitting: Adult Health

## 2018-11-25 VITALS — BP 129/72 | HR 97 | Temp 98.5°F | Ht 64.0 in | Wt 171.0 lb

## 2018-11-25 DIAGNOSIS — J00 Acute nasopharyngitis [common cold]: Secondary | ICD-10-CM

## 2018-11-25 DIAGNOSIS — R319 Hematuria, unspecified: Secondary | ICD-10-CM

## 2018-11-25 DIAGNOSIS — R300 Dysuria: Secondary | ICD-10-CM

## 2018-11-25 LAB — POCT URINALYSIS DIPSTICK OB
Glucose, UA: NEGATIVE
Ketones, UA: NEGATIVE
Leukocytes, UA: NEGATIVE
Nitrite, UA: NEGATIVE
POC,PROTEIN,UA: NEGATIVE

## 2018-11-25 MED ORDER — CEPHALEXIN 500 MG PO CAPS: 500.0000 mg | ORAL_CAPSULE | Freq: Four times a day (QID) | ORAL | 0 refills | Status: DC

## 2018-11-25 MED ORDER — MUCINEX DM MAXIMUM STRENGTH 60-1200 MG PO TB12: ORAL_TABLET | ORAL | 0 refills | Status: DC

## 2018-11-25 NOTE — Progress Notes (Signed)
  Subjective:     Patient ID: Sharon Rowland, female   DOB: 01-Aug-1986, 32 y.o.   MRN: [id number]  HPI Shyloh is a 32 year old white female,married,G6P6, seen today for complaints of cough, sore throat,fatigue and burning with urination and back pain, and some stomach pain.She did not work yesterday, and this has been going on for over a week. She is on Macrobid for prophylaxis, does self caths at times. 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  PCP is Dr Gerda Diss.  Review of Systems +cough Bloody mucous +sore throat +fatigue +burning with urination +back pain and stomach pain Reviewed past medical,surgical, social and family history. Reviewed medications and allergies.     Objective:   Physical Exam BP 129/72 (BP Location: Left Arm, Patient Position: Sitting, Cuff Size: Normal)   Pulse 97   Temp 98.5 F (36.9 C)   Ht 5\' 4"  (1.626 m)   Wt 171 lb (77.6 kg)   LMP 10/23/2016 (Approximate)   Breastfeeding? No   BMI 29.35 kg/m urine dipstick +blood. Skin warm and dry. Neck: mid line trachea, normal thyroid, good ROM, no lymphadenopathy noted,but tender. Lungs: clear to ausculation bilaterally. Cardiovascular: regular rate and rhythm.Ears have pearly gray TM, maxillary sinus tender and throat is red and irritated, no pustules but some phlegm noted. No CVAT. Declines pelvic exam.  Fall risk low. Will treat with Keflex to cover URI and UTI.And get mucinex DM for cough and to thin secretions  She needs note for work.    Assessment:     1. Acute nasopharyngitis   2. Burning with urination   3. Hematuria, unspecified type       Plan:     Meds ordered this encounter  Medications  . cephALEXin (KEFLEX) 500 MG capsule    Sig: Take 1 capsule (500 mg total) by mouth 4 (four) times daily.    Dispense:  28 capsule    Refill:  0    Order Specific Question:   Supervising Provider    Answer:   EURE, LUTHER H [[id number]]  . Dextromethorphan-guaiFENesin (MUCINEX DM MAXIMUM STRENGTH)  60-1200 MG TB12    Sig: Take as directed    Dispense:  28 each    Refill:  0    Order Specific Question:   Supervising Provider    Answer:   Lazaro Arms [[id number]]  Push fluids, and rest Note given to return to work 11/28/18 F/U prn

## 2019-02-13 ENCOUNTER — Other Ambulatory Visit: Payer: Self-pay

## 2019-02-13 ENCOUNTER — Encounter (HOSPITAL_COMMUNITY): Payer: Self-pay | Admitting: *Deleted

## 2019-02-13 ENCOUNTER — Emergency Department (HOSPITAL_COMMUNITY)
Admission: EM | Admit: 2019-02-13 | Discharge: 2019-02-13 | Disposition: A | Discharge status: Home / Self Care | Payer: Medicaid Other | Attending: Emergency Medicine | Admitting: Emergency Medicine

## 2019-02-13 DIAGNOSIS — R319 Hematuria, unspecified: Secondary | ICD-10-CM

## 2019-02-13 DIAGNOSIS — Z91040 Latex allergy status: Secondary | ICD-10-CM | POA: Insufficient documentation

## 2019-02-13 DIAGNOSIS — J45909 Unspecified asthma, uncomplicated: Secondary | ICD-10-CM | POA: Insufficient documentation

## 2019-02-13 DIAGNOSIS — Z79899 Other long term (current) drug therapy: Secondary | ICD-10-CM | POA: Insufficient documentation

## 2019-02-13 DIAGNOSIS — N390 Urinary tract infection, site not specified: Secondary | ICD-10-CM | POA: Insufficient documentation

## 2019-02-13 LAB — URINALYSIS, ROUTINE W REFLEX MICROSCOPIC
Bacteria, UA: NONE SEEN
Bilirubin Urine: NEGATIVE
GLUCOSE, UA: NEGATIVE mg/dL
Ketones, ur: NEGATIVE mg/dL
Nitrite: NEGATIVE
PROTEIN: 100 mg/dL — AB
RBC / HPF: >50 RBC/hpf — ABNORMAL HIGH (ref 0–5)
Specific Gravity, Urine: 1.018 (ref 1.005–1.030)
WBC, UA: >50 WBC/hpf — ABNORMAL HIGH (ref 0–5)
pH: 6 (ref 5.0–8.0)

## 2019-02-13 LAB — CBC WITH DIFFERENTIAL/PLATELET
ABS IMMATURE GRANULOCYTES: 0.03 10*3/uL (ref 0.00–0.07)
Basophils Absolute: 0 10*3/uL (ref 0.0–0.1)
Basophils Relative: 0 %
Eosinophils Absolute: 0 10*3/uL (ref 0.0–0.5)
Eosinophils Relative: 0 %
HCT: 41.7 % (ref 36.0–46.0)
Hemoglobin: 13.2 g/dL (ref 12.0–15.0)
Immature Granulocytes: 0 %
Lymphocytes Relative: 20 %
Lymphs Abs: 2.1 10*3/uL (ref 0.7–4.0)
MCH: 26.9 pg (ref 26.0–34.0)
MCHC: 31.7 g/dL (ref 30.0–36.0)
MCV: 85.1 fL (ref 80.0–100.0)
MONO ABS: 0.7 10*3/uL (ref 0.1–1.0)
Monocytes Relative: 6 %
Neutro Abs: 7.8 10*3/uL — ABNORMAL HIGH (ref 1.7–7.7)
Neutrophils Relative %: 74 %
Platelets: 256 10*3/uL (ref 150–400)
RBC: 4.9 MIL/uL (ref 3.87–5.11)
RDW: 13.2 % (ref 11.5–15.5)
WBC: 10.6 10*3/uL — ABNORMAL HIGH (ref 4.0–10.5)
nRBC: 0 % (ref 0.0–0.2)

## 2019-02-13 LAB — BASIC METABOLIC PANEL
Anion gap: 9 (ref 5–15)
BUN: 8 mg/dL (ref 6–20)
CO2: 23 mmol/L (ref 22–32)
Calcium: 9 mg/dL (ref 8.9–10.3)
Chloride: 106 mmol/L (ref 98–111)
Creatinine, Ser: 0.69 mg/dL (ref 0.44–1.00)
GFR calc Af Amer: >60 mL/min (ref >60)
GFR calc non Af Amer: >60 mL/min (ref >60)
Glucose, Bld: 106 mg/dL — ABNORMAL HIGH (ref 70–99)
Potassium: 3.7 mmol/L (ref 3.5–5.1)
Sodium: 138 mmol/L (ref 135–145)

## 2019-02-13 MED ORDER — CEPHALEXIN 500 MG PO CAPS: 500.0000 mg | ORAL_CAPSULE | Freq: Four times a day (QID) | ORAL | 0 refills | Status: DC

## 2019-02-13 MED ORDER — SELF-CATH SYSTEM STRAIGHT TIP KIT: 1.0000 | PACK | 11 refills | Status: DC | PRN

## 2019-02-13 MED ORDER — SODIUM CHLORIDE 0.9 % IV SOLN
1.0000 g | Freq: Once | INTRAVENOUS | Status: AC
  Administered 2019-02-13: 1 g via INTRAVENOUS
  Filled 2019-02-13: qty 10

## 2019-02-13 NOTE — ED Provider Notes (Signed)
[facility] EMERGENCY DEPARTMENT Provider Note   CSN: [id number] Arrival date & time: 02/13/19  5277    History   Chief Complaint Chief Complaint  Patient presents with  . Abdominal Pain    HPI Sharon Rowland is a 33 y.o. female.  The history is provided by the patient.  She has history of asthma, GERD, SVT, neurogenic bladder and comes in with concerns of urinary tract infection.  She states that she self catheterizes herself on an as-needed basis.  For the last 2 days, she has had increased lower abdominal pain with radiation into the lower flank bilaterally.  There is also been some nausea but no vomiting.  Tonight, she had subjective fever along with chills and sweats.  She did take some ibuprofen and is feeling somewhat better.  She is very concerned about developing a urinary tract infection.  She has had frequent infections.  She is on continuous nitrofurantoin for prophylaxis and also takes ciprofloxacin when she feels she has a urinary tract infection.  Today, she has had severe dysuria as well as hematuria.  Past Medical History:  Diagnosis Date  . Allergic rhinitis   . Anemia   . Asthma    last used inhaler 1 month ago  . Dysrhythmia    SVT  . Female bladder prolapse   . GERD (gastroesophageal reflux disease)    with pregnancy only  . Pelvic pain in antepartum period in second trimester 01/17/2014  . Pregnant 08/01/2015  . PSVT (paroxysmal supraventricular tachycardia) ([place])    could not be confirmed with 2016 study  . Rectocele   . Urethral prolapse   . Uterine prolapse   . Vaginal Pap smear, abnormal     Patient Active Problem List   Diagnosis Date Noted  . Acute nasopharyngitis 11/25/2018  . Hematuria 11/25/2018  . Burning with urination 11/25/2018  . S/P vaginal hysterectomy 09/17/2017  . Normal labor 08/10/2017  . Uterine/bladder prolapse 02/13/2017  . History of recurrent UTI (urinary tract infection) 02/27/2016  . Migraines 10/03/2015  . Supervision  of normal pregnancy 09/05/2015  . Trigger point of shoulder region 06/20/2015  . Rh negative state in antepartum period 01/31/2014  . Abnormal Pap smear of cervix 01/31/2014  . Paroxysmal SVT (supraventricular tachycardia) ([place]) 11/09/2013  . Asthma, stable 11/09/2013    Past Surgical History:  Procedure Laterality Date  . CYSTOCELE REPAIR N/A 09/17/2017   Procedure: ANTERIOR REPAIR (CYSTOCELE);  Surgeon: Florian Buff, MD;  Location: [facility];  Service: Gynecology;  Laterality: N/A;  . SUPRAVENTRICULAR TACHYCARDIA ABLATION  01/05/2015   unsuccessful  . SUPRAVENTRICULAR TACHYCARDIA ABLATION N/A 01/05/2015   Procedure: SUPRAVENTRICULAR TACHYCARDIA ABLATION;  Surgeon: Evans Lance, MD;  Location: [facility] CATH LAB;  Service: Cardiovascular;  Laterality: N/A;  . VAGINAL HYSTERECTOMY N/A 09/17/2017   Procedure: HYSTERECTOMY VAGINAL WITH POSSIBLE BILATERAL SALPINGECTOMY;  Surgeon: Florian Buff, MD;  Location: [facility];  Service: Gynecology;  Laterality: N/A;     OB History    Gravida  6   Para  6   Term  6   Preterm      AB      Living  6     SAB      TAB      Ectopic      Multiple  0   Live Births  6            Home Medications    Prior to Admission medications   Medication Sig Start Date  End Date Taking? Authorizing Provider  acetaminophen (TYLENOL) 500 MG tablet Take 1,000 mg by mouth 2 (two) times daily.     [provider]  albuterol (PROVENTIL HFA;VENTOLIN HFA) 108 (90 Base) MCG/ACT inhaler Inhale 2 puffs into the lungs every 6 (six) hours as needed for wheezing or shortness of breath.     [provider]  Catheters (SELF-CATH SYSTEM STRAIGHT TIP) KIT 1 Device by Does not apply route as needed. 09/28/18   Florian Buff, MD  cephALEXin (KEFLEX) 500 MG capsule Take 1 capsule (500 mg total) by mouth 4 (four) times daily. 11/25/18   Estill Dooms, NP  Dextromethorphan-guaiFENesin [facility] DM MAXIMUM STRENGTH) 60-1200 MG TB12 Take as directed  11/25/18   Derrek Monaco A, NP  ibuprofen (ADVIL,MOTRIN) 600 MG tablet Take 800 mg by mouth every 6 (six) hours as needed.    [provider]  lidocaine (XYLOCAINE) 2 % jelly Place 1 application into the urethra as needed. 02/16/18   Florian Buff, MD  nitrofurantoin, macrocrystal-monohydrate, (MACROBID) 100 MG capsule Take 1 tablet nightly 03/31/18   Florian Buff, MD  phenazopyridine (PYRIDIUM) 95 MG tablet Take 95 mg by mouth 3 (three) times daily as needed for pain.    [provider]  topiramate (TOPAMAX) 50 MG tablet Take 0.5 tablets (25 mg total) by mouth 2 (two) times daily. 08/03/18   Florian Buff, MD    Family History Family History  Problem Relation Age of Onset  . Hypertension Mother   . Hyperlipidemia Mother   . Multiple sclerosis Mother   . COPD Mother   . Cancer Mother        melanoma x 2  . Diabetes Mother   . Heart disease Father        PSVT  . Hyperlipidemia Brother   . Hypertension Brother   . Heart disease Maternal Grandfather        heart attack  . Cancer Paternal Grandmother        pancreatic cancer  . Heart disease Paternal Grandmother        MI  . Hyperlipidemia Paternal Grandmother   . Hypertension Paternal Grandmother   . Asthma Daughter   . Asthma Son   . Autism Son   . ADD / ADHD Son   . Tourette syndrome Son   . Other Son        Neurological and mental issues  . ADD / ADHD Son     Social History Social History   Tobacco Use  . Smoking status: Never Smoker  . Smokeless tobacco: Never Used  Substance Use Topics  . Alcohol use: No  . Drug use: No     Allergies   Epinephrine and Latex   Review of Systems Review of Systems  All other systems reviewed and are negative.    Physical Exam Updated Vital Signs BP 137/86 (BP Location: Right Arm)   Pulse (!) 106   Temp 97.8 F (36.6 C) (Oral)   Resp 14   Ht _0  (1.6 m)   Wt 72.6 kg   LMP 10/23/2016 (Approximate)   SpO2 99%   BMI 28.34 kg/m   Physical  Exam Vitals signs and nursing note reviewed.    33 year old female, resting comfortably and in no acute distress. Vital signs are significant for mildly elevated heart rate. Oxygen saturation is 99%, which is normal. Head is normocephalic and atraumatic. PERRLA, EOMI. Oropharynx is clear. Neck is nontender and supple without  adenopathy or JVD. Back is nontender and there is no CVA tenderness. Lungs are clear without rales, wheezes, or rhonchi. Chest is nontender. Heart has regular rate and rhythm without murmur. Abdomen is soft, flat, nontender without masses or hepatosplenomegaly and peristalsis is normoactive. Extremities have no cyanosis or edema, full range of motion is present. Skin is warm and dry without rash. Neurologic: Mental status is normal, cranial nerves are intact, there are no motor or sensory deficits.  ED Treatments / Results  Labs (all labs ordered are listed, but only abnormal results are displayed) Labs Reviewed  URINALYSIS, ROUTINE W REFLEX MICROSCOPIC - Abnormal; Notable for the following components:      Result Value   APPearance TURBID (*)    Hgb urine dipstick LARGE (*)    Protein, ur 100 (*)    Leukocytes,Ua MODERATE (*)    RBC / HPF >50 (*)    WBC, UA >50 (*)    All other components within normal limits  BASIC METABOLIC PANEL - Abnormal; Notable for the following components:   Glucose, Bld 106 (*)    All other components within normal limits  CBC WITH DIFFERENTIAL/PLATELET - Abnormal; Notable for the following components:   WBC 10.6 (*)    Neutro Abs 7.8 (*)    All other components within normal limits   Procedures Procedures   Medications Ordered in ED Medications  cefTRIAXone (ROCEPHIN) 1 g in sodium chloride 0.9 % 100 mL IVPB (1 g Intravenous New Bag/Given 02/13/19 0711)     Initial Impression / Assessment and Plan / ED Course  I have reviewed the triage vital signs and the nursing notes.  Pertinent labs & imaging results that were  available during my care of the patient were reviewed by me and considered in my medical decision making (see chart for details).  Probable urinary tract infection.  I am concerned about possibility of resistant infections given her constant use of nitrofurantoin and frequent use of ciprofloxacin.  I am also curious about the diagnosis of neurogenic bladder as this should not be a normal consequence of vaginal hysterectomy.  Patient does tell me that she has had some unusual neurologic symptoms and is seeing orthopedics regarding that.  She has not been evaluated by urology or neurology and I have discussed with her that both of those would be appropriate given her current symptoms.  Today, will check CBC and urinalysis and send urine for culture.  Old records are reviewed confirming vaginal hysterectomy September 2018 and outpatient management of neurogenic bladder since then.  Urinalysis does confirm presence of UTI.  CBC and metabolic panel are unremarkable.  She is given a dose of ceftriaxone and discharged with prescription for cephalexin.  Referred to urology and neurology for further outpatient work-up.  Return precautions discussed.  Final Clinical Impressions(s) / ED Diagnoses   Final diagnoses:  Urinary tract infection with hematuria, site unspecified    ED Discharge Orders         Ordered    Catheters (SELF-CATH SYSTEM STRAIGHT TIP) KIT  As needed    Note to Pharmacy:  7 French please   02/13/19 0714    cephALEXin (KEFLEX) 500 MG capsule  4 times daily     02/13/19 3299           Delora Fuel, MD 24/26[birth year]83 [phone]

## 2019-02-13 NOTE — Discharge Instructions (Addendum)
Return if pain is getting worse, you start running a high fever, or start vomiting.  Follow up with the urologist to better characterize your bladder issues.  Follow up with one of the neurologists to evaluate your neurologic symptoms.

## 2019-02-13 NOTE — ED Triage Notes (Signed)
Pt c/o burning with urination and abdominal and back pain; pt states she has been having UTI sx for 2 weeks; pt states she has been urinating blood

## 2019-02-15 ENCOUNTER — Telehealth: Payer: Self-pay | Admitting: Family Medicine

## 2019-02-15 DIAGNOSIS — G834 Cauda equina syndrome: Secondary | ICD-10-CM

## 2019-02-15 NOTE — Telephone Encounter (Signed)
Please advise 

## 2019-02-15 NOTE — Telephone Encounter (Signed)
Neurogenic bladder syndrome, frequent UTI, requesting referral to Neurologist Dr.Doonquah.

## 2019-02-15 NOTE — Telephone Encounter (Signed)
Patient states she is already established with [facility] and aware we put in the referral for Neuro.

## 2019-02-15 NOTE — Telephone Encounter (Signed)
That would be fine to go ahead and do Patient should also consider consultation with [facility] If she would like to have that referral that may be done as well

## 2019-02-16 ENCOUNTER — Encounter: Payer: Self-pay | Admitting: Family Medicine

## 2019-02-16 ENCOUNTER — Ambulatory Visit: Payer: Self-pay | Admitting: Family Medicine

## 2019-02-16 VITALS — BP 118/74 | Ht 63.0 in | Wt 168.0 lb

## 2019-02-16 DIAGNOSIS — N319 Neuromuscular dysfunction of bladder, unspecified: Secondary | ICD-10-CM

## 2019-02-16 DIAGNOSIS — R1314 Dysphagia, pharyngoesophageal phase: Secondary | ICD-10-CM

## 2019-02-16 NOTE — Progress Notes (Signed)
   Subjective:    Patient ID: Sharon Rowland, female    DOB: 1986/11/01, 33 y.o.   MRN: [id number]  HPITrouble swallowing food off and on for a couple of years. Has been worse the past couple of days. Always feels like something is in throat. Seems like it takes food a long time to go down.   Seen in ED over the weekend and was told she has possible MS. She is suppose to go see DR. Doonquah.     Review of Systems  Constitutional: Negative for activity change and appetite change.  HENT: Negative for congestion and rhinorrhea.   Respiratory: Negative for cough and shortness of breath.   Cardiovascular: Negative for chest pain and leg swelling.  Gastrointestinal: Negative for abdominal pain, nausea and vomiting.  Skin: Negative for color change.  Neurological: Negative for dizziness and weakness.  Psychiatric/Behavioral: Negative for agitation and confusion.       Objective:   Physical Exam Vitals signs reviewed.  Constitutional:      General: She is not in acute distress. HENT:     Head: Normocephalic and atraumatic.  Eyes:     General:        Right eye: No discharge.        Left eye: No discharge.  Neck:     Trachea: No tracheal deviation.  Cardiovascular:     Rate and Rhythm: Normal rate and regular rhythm.     Heart sounds: Normal heart sounds. No murmur.  Pulmonary:     Effort: Pulmonary effort is normal. No respiratory distress.     Breath sounds: Normal breath sounds.  Lymphadenopathy:     Cervical: No cervical adenopathy.  Skin:    General: Skin is warm and dry.  Neurological:     Mental Status: She is alert.     Coordination: Coordination normal.  Psychiatric:        Behavior: Behavior normal.           Assessment & Plan:  Very challenging situation Patient has neurogenic bladder We talked about MS Her mom had history of MS Given this patient's symptomatology and problems I think it is reasonable to get the patient in with neurology We talked about  opportunities regarding this she would like to see a neurologist who focuses mainly on MS  As for the neurogenic bladder she will also be following up with Dr. Despina Hidden  Patient also having problems with dysphagia feeling like things are getting stuck when the going down she is having eat very small amounts.  It is possible she is having some mild achalasia it is also possible she may be having some dysmotility we will help get her in with gastroenterology more than likely need EGD it is quite possible there I also have to do a swallowing study

## 2019-02-18 ENCOUNTER — Encounter: Payer: Self-pay | Admitting: Family Medicine

## 2019-02-19 MED ORDER — CIPROFLOXACIN HCL 500 MG PO TABS: 500.0000 mg | ORAL_TABLET | Freq: Two times a day (BID) | ORAL | 0 refills | Status: DC

## 2019-02-19 NOTE — Addendum Note (Signed)
Addended by: Margaretha Sheffield on: 02/19/2019 05:12 PM   Modules accepted: Orders

## 2019-02-19 NOTE — Telephone Encounter (Signed)
Nurses Please have the patient stop the Keflex Send in Cipro 500 mg twice daily 7 days Follow-up if ongoing troubles It would be fine to also inform the patient the hospital did not do urine culture It would be of little value to do another urine currently but if does not clear up with the Cipro I recommend further looking into with a follow-up office visit

## 2019-02-20 ENCOUNTER — Telehealth: Payer: Self-pay | Admitting: Physician Assistant

## 2019-02-20 DIAGNOSIS — K2100 Gastro-esophageal reflux disease with esophagitis, without bleeding: Secondary | ICD-10-CM

## 2019-02-20 NOTE — Progress Notes (Signed)
Based on what you shared with me, I feel your condition warrants further evaluation and I recommend that you be seen for a face to face office visit. If you are having reflux symptoms significant enough to cause regurgitation, you need a detailed examination and potentially a scoping of the esophagus to rule out more serious causes of symptoms. Please be seen ASAP   NOTE: If you entered your credit card information for this eVisit, you will not be charged. You may see a "hold" on your card for the $30 but that hold will drop off and you will not have a charge processed.  If you are having a true medical emergency please call 911.  If you need an urgent face to face visit, [facility] has four urgent care centers for your convenience.  If you need care fast and have a high deductible or no insurance consider:   [email or web address] to reserve your spot online an avoid wait times  [facility] [address] [place], [place] [address] 8 am to 8 pm Monday-Friday 10 am to 4 pm Saturday-Sunday *Across the street from [employer]  [address] [place], [address] 8 am to 5 pm Monday-Friday * In the [facility] on the [facility] [address] [address]   The following sites will take your insurance:  . [facility]  [phone] Get Driving Directions Find a Provider at this Location  [address] [place], [place] [address] . 10 am to 8 pm Monday-Friday . 12 pm to 8 pm Saturday-Sunday   . [facility] at [facility]  [phone] Get Driving Directions Find a Provider at this Location  [address], [address] [place], [place] [address] . 8 am to 8 pm Monday-Friday . 9 am to 6 pm Saturday . 11 am to 6 pm Sunday   . [facility] at [facility]  [phone] Get Driving Directions  [address] [place], [place] [address] . 8 am to 8 pm Monday-Friday . 8 am to  4 pm Saturday-Sunday   Your e-visit answers were reviewed by a board certified advanced clinical practitioner to complete your personal care plan.  Thank you for using e-Visits.

## 2019-02-22 ENCOUNTER — Encounter: Payer: Self-pay | Admitting: Internal Medicine

## 2019-02-25 ENCOUNTER — Other Ambulatory Visit: Payer: Self-pay

## 2019-02-25 ENCOUNTER — Ambulatory Visit: Payer: Self-pay | Admitting: Obstetrics & Gynecology

## 2019-02-25 ENCOUNTER — Encounter: Payer: Self-pay | Admitting: Obstetrics & Gynecology

## 2019-02-25 VITALS — BP 132/77 | HR 77 | Ht 63.0 in | Wt 171.0 lb

## 2019-02-25 DIAGNOSIS — R350 Frequency of micturition: Secondary | ICD-10-CM

## 2019-02-25 DIAGNOSIS — N3941 Urge incontinence: Secondary | ICD-10-CM

## 2019-02-25 DIAGNOSIS — N3281 Overactive bladder: Secondary | ICD-10-CM

## 2019-02-25 DIAGNOSIS — Z87440 Personal history of urinary (tract) infections: Secondary | ICD-10-CM

## 2019-02-25 LAB — POCT URINALYSIS DIPSTICK OB
Glucose, UA: NEGATIVE
Ketones, UA: NEGATIVE
Leukocytes, UA: NEGATIVE
Nitrite, UA: NEGATIVE
POC,PROTEIN,UA: NEGATIVE
RBC UA: NEGATIVE

## 2019-02-25 MED ORDER — MIRABEGRON ER 50 MG PO TB24: ORAL_TABLET | ORAL | 11 refills | Status: DC

## 2019-02-25 NOTE — Progress Notes (Signed)
Chief Complaint  Patient presents with  . Follow-up    bladder issues & UIT      33 y.o. [id number] Patient's last menstrual period was 10/23/2016 (approximate). The current method of family planning is status post hysterectomy.  Outpatient Encounter Medications as of 02/25/2019  Medication Sig  . acetaminophen (TYLENOL) 500 MG tablet Take 1,000 mg by mouth 2 (two) times daily.   Marland Kitchen albuterol (PROVENTIL HFA;VENTOLIN HFA) 108 (90 Base) MCG/ACT inhaler Inhale 2 puffs into the lungs every 6 (six) hours as needed for wheezing or shortness of breath.   . Catheters (SELF-CATH SYSTEM STRAIGHT TIP) KIT 1 Device by Does not apply route as needed.  Marland Kitchen ibuprofen (ADVIL,MOTRIN) 600 MG tablet Take 800 mg by mouth every 6 (six) hours as needed.  . topiramate (TOPAMAX) 50 MG tablet Take 0.5 tablets (25 mg total) by mouth 2 (two) times daily.  Marland Kitchen lidocaine (XYLOCAINE) 2 % jelly Place 1 application into the urethra as needed. (Patient not taking: Reported on 02/25/2019)  . mirabegron ER (MYRBETRIQ) 50 MG TB24 tablet Take 1 nightly  . [DISCONTINUED] cephALEXin (KEFLEX) 500 MG capsule Take 1 capsule (500 mg total) by mouth 4 (four) times daily.  . [DISCONTINUED] ciprofloxacin (CIPRO) 500 MG tablet Take 1 tablet (500 mg total) by mouth 2 (two) times daily.  . [DISCONTINUED] Dextromethorphan-guaiFENesin (MUCINEX DM MAXIMUM STRENGTH) 60-1200 MG TB12 Take as directed  . [DISCONTINUED] nitrofurantoin, macrocrystal-monohydrate, (MACROBID) 100 MG capsule Take 1 tablet nightly  . [DISCONTINUED] phenazopyridine (PYRIDIUM) 95 MG tablet Take 95 mg by mouth 3 (three) times daily as needed for pain.   No facility-administered encounter medications on file as of 02/25/2019.     Subjective Mida has had a challenging few weeks To recap she is s/p a TVH with bilateral salpingectomy anterior colporrhpahy Georgina Peer Plication McCall culdoplasty 09/17/2017 due to symptomatic prolapse She was actually symptomatic prior to her  last pregnancy but became pregnant We were managing prior to her last pregnancy solutions She delivered 08/11/2017 and underwent the surgery 5 weeks later as much for financial reasons as any as she would be losing her medicaid The surgery went well and she has had a terrific anatomic result However her bladder has been surprisingly problematic since, even without doing a sling she had suboptimal bladder emptying whci she related to being a long time issue, she aslo stated her mother had MS and I actually did an MRI to evaluate for evidence of any lesions as a source of her incomplete bladder emptying which required intermittent self cath MRI was normal Fast forward to the last few weeks and she had what she thought was a significant bladder infection and she was seen at [facility] ED with a contaminated urine, negative nitrites, culture unfortunately has not been done during this latest episode She received rocephin and keflex without resolution of her symptoms and she was then started on Cipro and 2 days left on that script Her UA today is negative and a culture, sort of TOC, culture is done  She continues to have some issues with episodic retention but she has developed OAB/urge incontinence which has proven to be much more troubling, pt is very upset and anxious about this development  She has a yeast infection today and would like to avoid a pelvic exam  Past Medical History:  Diagnosis Date  . Allergic rhinitis   . Anemia   . Asthma    last used inhaler 1 month ago  . Dysphagia   .  Dysrhythmia    SVT  . Female bladder prolapse   . GERD (gastroesophageal reflux disease)    with pregnancy only  . Pelvic pain in antepartum period in second trimester 01/17/2014  . Pregnant 08/01/2015  . PSVT (paroxysmal supraventricular tachycardia) ([place])    could not be confirmed with 2016 study  . Rectocele   . Urethral prolapse   . Uterine prolapse   . Vaginal Pap smear, abnormal     Past Surgical  History:  Procedure Laterality Date  . CYSTOCELE REPAIR N/A 09/17/2017   Procedure: ANTERIOR REPAIR (CYSTOCELE);  Surgeon: Florian Buff, MD;  Location: [facility];  Service: Gynecology;  Laterality: N/A;  . SUPRAVENTRICULAR TACHYCARDIA ABLATION  01/05/2015   unsuccessful  . SUPRAVENTRICULAR TACHYCARDIA ABLATION N/A 01/05/2015   Procedure: SUPRAVENTRICULAR TACHYCARDIA ABLATION;  Surgeon: Evans Lance, MD;  Location: [facility] CATH LAB;  Service: Cardiovascular;  Laterality: N/A;  . VAGINAL HYSTERECTOMY N/A 09/17/2017   Procedure: HYSTERECTOMY VAGINAL WITH POSSIBLE BILATERAL SALPINGECTOMY;  Surgeon: Florian Buff, MD;  Location: [facility];  Service: Gynecology;  Laterality: N/A;    OB History    Gravida  6   Para  6   Term  6   Preterm      AB      Living  6     SAB      TAB      Ectopic      Multiple  0   Live Births  6           Allergies  Allergen Reactions  . Epinephrine Other (See Comments)    Pt states she is "sensitive" to epinephrine.   . Latex Dermatitis    Social History   Socioeconomic History  . Marital status: Married    Spouse name: Not on file  . Number of children: Not on file  . Years of education: Not on file  . Highest education level: Not on file  Occupational History  . Not on file  Social Needs  . Financial resource strain: Not on file  . Food insecurity:    Worry: Not on file    Inability: Not on file  . Transportation needs:    Medical: Not on file    Non-medical: Not on file  Tobacco Use  . Smoking status: Never Smoker  . Smokeless tobacco: Never Used  Substance and Sexual Activity  . Alcohol use: No  . Drug use: No  . Sexual activity: Yes    Birth control/protection: Surgical    Comment: hyst  Lifestyle  . Physical activity:    Days per week: Not on file    Minutes per session: Not on file  . Stress: Not on file  Relationships  . Social connections:    Talks on phone: Not on file    Gets together: Not on file    Attends  religious service: Not on file    Active member of club or organization: Not on file    Attends meetings of clubs or organizations: Not on file    Relationship status: Not on file  Other Topics Concern  . Not on file  Social History Narrative  . Not on file    Family History  Problem Relation Age of Onset  . Hypertension Mother   . Hyperlipidemia Mother   . Multiple sclerosis Mother   . COPD Mother   . Cancer Mother        melanoma x 2  . Diabetes Mother   .  Heart disease Father        PSVT  . Hyperlipidemia Brother   . Hypertension Brother   . Heart disease Maternal Grandfather        heart attack  . Cancer Paternal Grandmother        pancreatic cancer  . Heart disease Paternal Grandmother        MI  . Hyperlipidemia Paternal Grandmother   . Hypertension Paternal Grandmother   . Asthma Daughter   . Asthma Son   . Autism Son   . ADD / ADHD Son   . Tourette syndrome Son   . Other Son        Neurological and mental issues  . ADD / ADHD Son     Medications:       Current Outpatient Medications:  .  acetaminophen (TYLENOL) 500 MG tablet, Take 1,000 mg by mouth 2 (two) times daily. , Disp: , Rfl:  .  albuterol (PROVENTIL HFA;VENTOLIN HFA) 108 (90 Base) MCG/ACT inhaler, Inhale 2 puffs into the lungs every 6 (six) hours as needed for wheezing or shortness of breath. , Disp: , Rfl:  .  Catheters (SELF-CATH SYSTEM STRAIGHT TIP) KIT, 1 Device by Does not apply route as needed., Disp: 50 each, Rfl: 11 .  ibuprofen (ADVIL,MOTRIN) 600 MG tablet, Take 800 mg by mouth every 6 (six) hours as needed., Disp: , Rfl:  .  topiramate (TOPAMAX) 50 MG tablet, Take 0.5 tablets (25 mg total) by mouth 2 (two) times daily., Disp: 60 tablet, Rfl: 11 .  lidocaine (XYLOCAINE) 2 % jelly, Place 1 application into the urethra as needed. (Patient not taking: Reported on 02/25/2019), Disp: 30 mL, Rfl: 11 .  mirabegron ER (MYRBETRIQ) 50 MG TB24 tablet, Take 1 nightly, Disp: 30 tablet, Rfl:  11  Objective Blood pressure 132/77, pulse 77, height _0  (1.6 m), weight 171 lb (77.6 kg), last menstrual period 10/23/2016, not currently breastfeeding.  Gen WDWN female at times upset and crying but appropriate  Pertinent ROS Per HPI  Labs or studies Reviewed all of her labs with pt viewing with me    Impression Diagnoses this Encounter::   ICD-10-CM   1. Urge incontinence N39.41   2. Urinary frequency R35.0 POC Urinalysis Dipstick OB    Urine Culture  3. History of UTI Z87.440 POC Urinalysis Dipstick OB    Urine Culture  4. OAB (overactive bladder) N32.81     Established relevant diagnosis(es):   Plan/Recommendations: Meds ordered this encounter  Medications  . mirabegron ER (MYRBETRIQ) 50 MG TB24 tablet    Sig: Take 1 nightly    Dispense:  30 tablet    Refill:  11    Labs or Scans Ordered: Orders Placed This Encounter  Procedures  . Urine Culture  . POC Urinalysis Dipstick OB    Management:: >start a trial of myrbetriq 50 mg for her urge incontinence >pt aware she will need scheduled self cath at least three times a day due to the detrussor stabilization of myrbetriq which likely will cause more retention, so she will do at least three times a day  Encouraged her to come to see me if she has interval symptom change etc  Also agree withgoing forward with neurologist appointment to evaluate for MS or other neurological issue  Informed Jessilyn her issues may be primarily detrusor or neurogenic or a combination We will address detrusor with myrbetriq first If it is unsuceesful it either has to be central neurogenic or peripheral, if it  is peripheral then she may need an implant for sacral nerve down regulation  All of these issues were discussed with the patient   Follow up Return in about 6 weeks (around 04/08/2019) for Follow up, with Dr Elonda Husky.        Face to face time:  15 minutes  Greater than 50% of the visit time was spent in counseling  and coordination of care with the patient.  The summary and outline of the counseling and care coordination is summarized in the note above.   All questions were answered.

## 2019-02-27 LAB — URINE CULTURE: Organism ID, Bacteria: NO GROWTH

## 2019-03-04 ENCOUNTER — Ambulatory Visit (INDEPENDENT_AMBULATORY_CARE_PROVIDER_SITE_OTHER): Payer: Self-pay | Admitting: Neurology

## 2019-03-04 ENCOUNTER — Encounter: Payer: Self-pay | Admitting: Neurology

## 2019-03-04 ENCOUNTER — Other Ambulatory Visit: Payer: Self-pay

## 2019-03-04 VITALS — BP 126/76 | HR 98 | Ht 63.0 in | Wt 173.0 lb

## 2019-03-04 DIAGNOSIS — R29818 Other symptoms and signs involving the nervous system: Secondary | ICD-10-CM

## 2019-03-04 DIAGNOSIS — R200 Anesthesia of skin: Secondary | ICD-10-CM | POA: Insufficient documentation

## 2019-03-04 DIAGNOSIS — R1310 Dysphagia, unspecified: Secondary | ICD-10-CM

## 2019-03-04 DIAGNOSIS — R339 Retention of urine, unspecified: Secondary | ICD-10-CM

## 2019-03-04 MED ORDER — PHENTERMINE HCL 37.5 MG PO CAPS: 37.5000 mg | ORAL_CAPSULE | ORAL | 2 refills | Status: DC

## 2019-03-04 NOTE — Progress Notes (Signed)
[facility]  PATIENT: Sharon Rowland DOB: 08-Nov-1986  REFERRING DOCTOR OR PCP:  Sallee Lange  SOURCE: patient, notes from PCP, imaging and lab reports, MRI on PACS.   _________________________________   HISTORICAL  CHIEF COMPLAINT:  Chief Complaint  Patient presents with  . New Patient (Initial Visit)    RM 55 with husband. Internal referral for cauda equina syndrome with neurgenic bladder, r/o MS. Mother has MS. Dx in early 80's. She is almost 46. Reports urinary retention and incontinence. Sometimes has to cath. About two weeks ago she went to ED for severe UTI and treated. Over the past few years has had chronic fatigue, pins/needles in her back, intermittent (saw ortho for this and got inj). Was supposed to have MRI lumbar but got pregnant and felt better after this.   Marland Kitchen Dysphagia    Few weeks ago, started to have rouble swallowing. Then started regurgitating and was advised to go to liquid diet.  But this did not help. Still having issues with this. Tried milkshake today but regurgitated.  . Migraine    Has had chronic migraines. Started early teens. Gets numb in face with migraines. Has eye pain in right eye when migraines start.   . Eye Problem    Vision got more blurry last summer. Last for a couple weeks and then got better on its own.     HISTORY OF PRESENT ILLNESS:  I had the pleasure seeing patient, Sharon Rowland, at [facility] for neurologic consultation regarding her dysesthesias and neurogenic bladder.  She is a 33 year old woman who began to experience issues with urinary incontinence in 2011.    She had a UTI at the time and improved with treatment.   However, she has had further bladder issues.   She had recurrent UTI's.  She had issues with vaginal prolapse and had a repair surgery in 2018.   Afterwards, she had severe urinary retention afterwards and needed to catheterize x several months.   She also experienced urgency and  frequency.    She has some urge incontinence and wears pads.    She urinates once an hour throughout the day and night.   Stream is often weak.   She still cath's up to three time a day and sometimes her volumes are large.      She notes some numbness in her left 5th digit and it increased a bit but never the whole leg.     Strength is good.   She feels her gait is slightly off balanced.  She sits on the edge of the tub as she is concerned about falling.    She feels balance is worse with eyes closed.     An MRI of the brain was performed 08/06/2018 and was read as normal..  I personally reviewed the images.  I personally reviewed the images.  There appears to be flair hyperintense signal in the gray and white matter of the right parietal lobe that is more subtle on T2-weighted images and not seen on T1-weighted images or post contrasted images.  Further this could be artifactual due to slicing through the gyri, I cannot rule out a lesion.  Over the last 2-3 weeks, she has had dysphagia and regurgitation.   Symptoms started after severe UTI but have persisted at a lower level.    Her husband also notes she has struggled more to find the right words.   She feels much more tired.    She is  needing to nap everyday now.       Last summer, she had blurry vision on the right.  Vision is now symmetric.  In 2016, she had a pins and needles sensation in her back, worse with her neck flexed.  Marland Kitchen   She had a trigger point injection without benefit.   She had a thoracic spine MRI scheduled but never had it done as she became pregnant.    She has no autoimmune disorders.   Her mom has MS and aunt has RA.      REVIEW OF SYSTEMS: Constitutional: No fevers, chills, sweats, or change in appetite.    Eyes: No visual changes, double vision, eye pain Ear, nose and throat: No hearing loss, ear pain, nasal congestion, sore throat Cardiovascular: No chest pain, palpitations Respiratory: No shortness of breath at rest or  with exertion.   No wheezes GastrointestinaI: No nausea, vomiting, diarrhea, abdominal pain, fecal incontinence Genitourinary: No dysuria, urinary retention or frequency.  No nocturia. Musculoskeletal: No neck pain, back pain Integumentary: No rash, pruritus, skin lesions Neurological: as above Psychiatric: No depression at this time.  No anxiety Endocrine: No palpitations, diaphoresis, change in appetite, change in weigh or increased thirst Hematologic/Lymphatic: No anemia, purpura, petechiae. Allergic/Immunologic: No itchy/runny eyes, nasal congestion, recent allergic reactions, rashes  ALLERGIES: Allergies  Allergen Reactions  . Epinephrine Other (See Comments)    Pt states she is "sensitive" to epinephrine.   . Latex Dermatitis    HOME MEDICATIONS:  Current Outpatient Medications:  .  acetaminophen (TYLENOL) 500 MG tablet, Take 1,000 mg by mouth 2 (two) times daily. , Disp: , Rfl:  .  albuterol (PROVENTIL HFA;VENTOLIN HFA) 108 (90 Base) MCG/ACT inhaler, Inhale 2 puffs into the lungs every 6 (six) hours as needed for wheezing or shortness of breath. , Disp: , Rfl:  .  Catheters (SELF-CATH SYSTEM STRAIGHT TIP) KIT, 1 Device by Does not apply route as needed., Disp: 50 each, Rfl: 11 .  ibuprofen (ADVIL,MOTRIN) 600 MG tablet, Take 800 mg by mouth every 6 (six) hours as needed., Disp: , Rfl:  .  lidocaine (XYLOCAINE) 2 % jelly, Place 1 application into the urethra as needed. (Patient not taking: Reported on 02/25/2019), Disp: 30 mL, Rfl: 11 .  mirabegron ER (MYRBETRIQ) 50 MG TB24 tablet, Take 1 nightly, Disp: 30 tablet, Rfl: 11 .  topiramate (TOPAMAX) 50 MG tablet, Take 0.5 tablets (25 mg total) by mouth 2 (two) times daily., Disp: 60 tablet, Rfl: 11  PAST MEDICAL HISTORY: Past Medical History:  Diagnosis Date  . Allergic rhinitis   . Anemia   . Asthma    last used inhaler 1 month ago  . Dysphagia   . Dysrhythmia    SVT  . Female bladder prolapse   . GERD (gastroesophageal  reflux disease)    with pregnancy only  . Pelvic pain in antepartum period in second trimester 01/17/2014  . Pregnant 08/01/2015  . PSVT (paroxysmal supraventricular tachycardia) ([place])    could not be confirmed with 2016 study  . Rectocele   . Urethral prolapse   . Uterine prolapse   . Vaginal Pap smear, abnormal     PAST SURGICAL HISTORY: Past Surgical History:  Procedure Laterality Date  . CYSTOCELE REPAIR N/A 09/17/2017   Procedure: ANTERIOR REPAIR (CYSTOCELE);  Surgeon: Florian Buff, MD;  Location: [facility];  Service: Gynecology;  Laterality: N/A;  . SUPRAVENTRICULAR TACHYCARDIA ABLATION  01/05/2015   unsuccessful  . SUPRAVENTRICULAR TACHYCARDIA ABLATION N/A 01/05/2015  Procedure: SUPRAVENTRICULAR TACHYCARDIA ABLATION;  Surgeon: Evans Lance, MD;  [facility] C[facility] LAB;  Service: Cardiovascular;  Laterality: N/A;  . VAGINAL HYSTERECTOMY N/A 09/17/2017   Procedure: HYSTERECTOMY VAGINAL WITH POSSIBLE BILATERAL SALPINGECTOMY;  Surgeon: Florian Buff, MD;  Location: [facility];  Service: Gynecology;  Laterality: N/A;    FAMILY HISTORY: Family History  Problem Relation Age of Onset  . Hypertension Mother   . Hyperlipidemia Mother   . Multiple sclerosis Mother   . COPD Mother   . Cancer Mother        melanoma x 2  . Diabetes Mother   . Heart disease Father        PSVT  . Hyperlipidemia Brother   . Hypertension Brother   . Heart disease Maternal Grandfather        heart attack  . Cancer Paternal Grandmother        pancreatic cancer  . Heart disease Paternal Grandmother        MI  . Hyperlipidemia Paternal Grandmother   . Hypertension Paternal Grandmother   . Asthma Daughter   . Asthma Son   . Autism Son   . ADD / ADHD Son   . Tourette syndrome Son   . Other Son        Neurological and mental issues  . ADD / ADHD Son     SOCIAL HISTORY:  Social History   Socioeconomic History  . Marital status: Married    Spouse name: Not on file  . Number of children: 6  . Years  of education: HS  . Highest education level: Not on file  Occupational History  . Occupation: [employer]  Social Needs  . Financial resource strain: Not on file  . Food insecurity:    Worry: Not on file    Inability: Not on file  . Transportation needs:    Medical: Not on file    Non-medical: Not on file  Tobacco Use  . Smoking status: Never Smoker  . Smokeless tobacco: Never Used  Substance and Sexual Activity  . Alcohol use: No  . Drug use: No  . Sexual activity: Yes    Birth control/protection: Surgical    Comment: hyst  Lifestyle  . Physical activity:    Days per week: Not on file    Minutes per session: Not on file  . Stress: Not on file  Relationships  . Social connections:    Talks on phone: Not on file    Gets together: Not on file    Attends religious service: Not on file    Active member of club or organization: Not on file    Attends meetings of clubs or organizations: Not on file    Relationship status: Not on file  . Intimate partner violence:    Fear of current or ex partner: Not on file    Emotionally abused: Not on file    Physically abused: Not on file    Forced sexual activity: Not on file  Other Topics Concern  . Not on file  Social History Narrative   Lives with husband   Right handed    Caffeine use: 1-2 diet cokes per day     PHYSICAL EXAM  Vitals:   03/04/19 1253  BP: 126/76  Pulse: 98  Weight: 173 lb (78.5 kg)  Height: _0  (1.6 m)    Body mass index is 30.65 kg/m.   General: The patient is well-developed and well-nourished and in no  acute distress  Eyes:  Funduscopic exam shows normal optic discs and retinal vessels.  Neck: The neck is supple, no carotid bruits are noted.  The neck is nontender.  Cardiovascular: The heart has a regular rate and rhythm with a normal S1 and S2. There were no murmurs, gallops or rubs. Lungs are clear to auscultation.  Skin: Extremities are without significant edema.   Musculoskeletal:  Back is nontender  Neurologic Exam  Mental status: The patient is alert and oriented x 3 at the time of the examination. The patient has apparent normal recent and remote memory, with an apparently normal attention span and concentration ability.   Speech is normal.  Cranial nerves: Extraocular movements are full. Pupils are equal, round, and reactive to light and accomodation.  Visual fields are full.  Facial symmetry is present. There is good facial sensation to soft touch bilaterally.Facial strength is normal.  Trapezius and sternocleidomastoid strength is normal. No dysarthria is noted.  The tongue is midline, and the patient has symmetric elevation of the soft palate. No obvious hearing deficits are noted.  Motor:  Muscle bulk is normal.   Tone is normal. Strength is  5 / 5 in all 4 extremities.   Sensory: Sensory testing is intact to pinprick, soft touch and vibration sensation in all 4 extremities except for mildly reduced sensation to touch in the S1 distribution of the left foot  Coordination: Cerebellar testing reveals good finger-nose-finger and heel-to-shin bilaterally.  Gait and station: Station is normal.   Gait is normal. Tandem gait is normal. Romberg is negative.   Reflexes: Deep tendon reflexes are symmetric and normal in arms and 3+ in legs.   No clonus.   Plantar responses are flexor.    DIAGNOSTIC DATA (LABS, IMAGING, TESTING) - I reviewed patient records, labs, notes, testing and imaging myself where available.  Lab Results  Component Value Date   WBC 10.6 (H) 02/13/2019   HGB 13.2 02/13/2019   HCT 41.7 02/13/2019   MCV 85.1 02/13/2019   PLT 256 02/13/2019      Component Value Date/Time   NA 138 02/13/2019 0604   NA 141 09/10/2017 1643   K 3.7 02/13/2019 0604   CL 106 02/13/2019 0604   CO2 23 02/13/2019 0604   GLUCOSE 106 (H) 02/13/2019 0604   BUN 8 02/13/2019 0604   BUN 10 09/10/2017 1643   CREATININE 0.69 02/13/2019 0604   CALCIUM  9.0 02/13/2019 0604   PROT 7.4 09/15/2017 1328   PROT 6.8 09/10/2017 1643   ALBUMIN 4.2 09/15/2017 1328   ALBUMIN 4.5 09/10/2017 1643   AST 14 (L) 09/15/2017 1328   ALT 23 09/15/2017 1328   ALKPHOS 81 09/15/2017 1328   BILITOT 0.4 09/15/2017 1328   BILITOT 0.4 09/10/2017 1643   GFRNONAA >60 02/13/2019 0604   GFRAA >60 02/13/2019 0604    Lab Results  Component Value Date   TSH 1.760 09/10/2017       ASSESSMENT AND PLAN  Urinary retention - Plan: MR CERVICAL SPINE W WO CONTRAST, MR THORACIC SPINE W WO CONTRAST, MR BRAIN W WO CONTRAST, CANCELED: MR BRAIN W WO CONTRAST  Numbness - Plan: MR CERVICAL SPINE W WO CONTRAST, MR THORACIC SPINE W WO CONTRAST, MR BRAIN W WO CONTRAST, CANCELED: MR BRAIN W WO CONTRAST  Lhermitte's sign positive - Plan: MR CERVICAL SPINE W WO CONTRAST, MR THORACIC SPINE W WO CONTRAST  Dysphagia, unspecified type - Plan: MR BRAIN W WO CONTRAST, CANCELED: MR BRAIN W WO CONTRAST  In summary, Ms. Mudrick is a 33 year old woman with a family history of multiple sclerosis who has a neurogenic bladder, history of Lhermitte sign and more recent difficulties with swallowing and regurgitation.  On examination, reflexes were 3 and symmetric in the legs.  She had mildly reduced sensation in the left S1 distribution.   We need to check an MRI of the cervical and thoracic spine due to the history of the Lhermitte sign and her urinary retention to make sure that there is not a central nervous system etiology.  Additionally, the MRI of the brain showed a subtle flair hyperintense focus in the right parietal cortex and we will follow this up with a repeat evaluation.  The MRI of the brain can also assess the brainstem to make sure that there are no lesions that could explain swallowing difficulties.  She also has a lot of fatigue and has had some weight gain over the last couple years.  She has been on phentermine in the past with benefit for both of these issues.  I will give her  a 30-day prescription with a couple refills.  She will be following up with urology or gynecology about her bladder issues.  She does not recall being on tamsulosin or Rapaflo in the past and I wonder if she would benefit from 1 of these.    She will return to see me in 6 weeks but call sooner if new or worsening neurologic symptoms.  Thank you for asking me to see Ms. Silfies.  Please let me know if I can be of further assistance with her or other patients in the future.   Ravynn Hogate A. Felecia Shelling, MD, PhD, FAAN Certified in Neurology, Clinical Neurophysiology, Sleep Medicine, Pain Medicine and Neuroimaging Director, [facility] [address] [place], [place] [address] [phone]

## 2019-03-08 ENCOUNTER — Telehealth: Payer: Self-pay

## 2019-03-08 ENCOUNTER — Telehealth: Payer: Self-pay | Admitting: Neurology

## 2019-03-08 NOTE — Telephone Encounter (Signed)
Noted, I spoke to the patient she is scheduled for 03/16/19 at [facility]. I did ask her the screen questions and I did also inform her that with everything going on it is a day by day basis and she may get a call to r/s. She understood.

## 2019-03-08 NOTE — Telephone Encounter (Signed)
-----   Message from Asa Lente, MD sent at 03/08/2019  3:36 PM EDT ----- She has her MRI scheduled but Mycharted back that still symptomatic.  please let her know I can see her Tues, or Wed if she is not improving.  Richard

## 2019-03-08 NOTE — Telephone Encounter (Signed)
I spoke to patient and Angie. Patient is going to call in on 03/11/2019 and Pay $500.00 for her MRI. I relayed to patient when she calls to pay $500.00 she needs to arrange payment plan with Angie for the rest. Angie is aware of this. Thanks Annabelle Harman. Patient is not scheduled for MRI yet . Thanks Annabelle Harman . Patient want's to come on the 03/16/2019 . Not sure the status of the office.

## 2019-03-08 NOTE — Telephone Encounter (Signed)
Pt called back and was scheduled for an appt with Dr. Epimenio Foot.

## 2019-03-08 NOTE — Telephone Encounter (Signed)
I called pt to offer her an appt. No answer, left a message asking her to call me back.

## 2019-03-09 ENCOUNTER — Ambulatory Visit: Payer: Self-pay | Admitting: Neurology

## 2019-03-09 ENCOUNTER — Encounter: Payer: Self-pay | Admitting: Neurology

## 2019-03-09 ENCOUNTER — Other Ambulatory Visit: Payer: Self-pay

## 2019-03-09 VITALS — BP 110/70 | HR 83 | Ht 63.0 in | Wt 169.5 lb

## 2019-03-09 DIAGNOSIS — R200 Anesthesia of skin: Secondary | ICD-10-CM

## 2019-03-09 DIAGNOSIS — G43909 Migraine, unspecified, not intractable, without status migrainosus: Secondary | ICD-10-CM

## 2019-03-09 DIAGNOSIS — W19XXXA Unspecified fall, initial encounter: Secondary | ICD-10-CM | POA: Insufficient documentation

## 2019-03-09 DIAGNOSIS — R339 Retention of urine, unspecified: Secondary | ICD-10-CM

## 2019-03-09 NOTE — Progress Notes (Signed)
[facility]  PATIENT: Onyinyechi Huante DOB: 1986-03-07  REFERRING DOCTOR OR PCP:  Sallee Lange  SOURCE: patient, notes from PCP, imaging and lab reports, MRI on PACS.   _________________________________   HISTORICAL  CHIEF COMPLAINT:  Chief Complaint  Patient presents with  . Follow-up    RM 13, alone. Last seen 03/04/19. Here to f/u on left arm/leg numbness, fingertip numbness. Also having pins/needles in her back. Has MRI brain/cervical/thoracic scheduled for 03/16/19 [facility] mobile unit. Walking has improved but other sx worsen towards the evening. She has had a fall.  . Migraine    When she gets a migraine, has vision changes and then pain in right eye. Right eye vision changes/pain, intermittent.     HISTORY OF PRESENT ILLNESS:  Brelee Renk is a 33 y.o. woman with  dysesthesias and neurogenic bladder.  Update 03/09/2019: She has tingling in her fingers and has some left sided weakness.   I saw her 03/04/2019.   On 03/06/2019,  she placed her right foot down on a shovel, the left leg gave way and she fell backwards hitting her head on the soft ground and twisting the right leg.    There was no LOC.    She feels like she is going to get a migraine --- typically she starts with right visual aura followed by right face tingling and pounding headache.       Since the fall, she notes more numbness in the left arm and both legs.     The legs feel like jello.  This was worse over the weekend but is better today than yesterday.  The bladder function is unchanged.  Her husband feels the left leg is dragging a little more since the fall.    She  She is scheduled for MRI next week of the brain and spine.     Since starting phentermine, she is noting more tingling in the scalp.     She self cath's up to tid.   PVR's range from 10-100 mL.Marland Kitchen    She was just started on Myrbetriq and feels unchanged.    She sleeps better by cathing at night.     Her mother has multiple sclerosis.   From 03/04/2019: She is a 33 year old woman who began to experience issues with urinary incontinence in 2011.    She had a UTI at the time and improved with treatment.   However, she has had further bladder issues.   She had recurrent UTI's.  She had issues with vaginal prolapse and had a repair surgery in 2018.   Afterwards, she had severe urinary retention afterwards and needed to catheterize x several months.   She also experienced urgency and frequency.    She has some urge incontinence and wears pads.    She urinates once an hour throughout the day and night.   Stream is often weak.   She still cath's up to three time a day and sometimes her volumes are large.      She notes some numbness in her left 5th digit and it increased a bit but never the whole leg.     Strength is good.   She feels her gait is slightly off balanced.  She sits on the edge of the tub as she is concerned about falling.    She feels balance is worse with eyes closed.     An MRI of the brain was performed 08/06/2018 and was read as normal..  I personally  reviewed the images.  I personally reviewed the images.  There appears to be flair hyperintense signal in the gray and white matter of the right parietal lobe that is more subtle on T2-weighted images and not seen on T1-weighted images or post contrasted images.  Further this could be artifactual due to slicing through the gyri, I cannot rule out a lesion.  Over the last 2-3 weeks, she has had dysphagia and regurgitation.   Symptoms started after severe UTI but have persisted at a lower level.    Her husband also notes she has struggled more to find the right words.   She feels much more tired.    She is needing to nap everyday now.       Last summer, she had blurry vision on the right.  Vision is now symmetric.  In 2016, she had a pins and needles sensation in her back, worse with her neck flexed.  Marland Kitchen   She had a trigger point injection without benefit.   She had a thoracic  spine MRI scheduled but never had it done as she became pregnant.    She has no autoimmune disorders.   Her mom has MS and aunt has RA.      REVIEW OF SYSTEMS: Constitutional: No fevers, chills, sweats, or change in appetite.    Eyes: No visual changes, double vision, eye pain Ear, nose and throat: No hearing loss, ear pain, nasal congestion, sore throat Cardiovascular: No chest pain, palpitations Respiratory: No shortness of breath at rest or with exertion.   No wheezes GastrointestinaI: No nausea, vomiting, diarrhea, abdominal pain, fecal incontinence Genitourinary: No dysuria, urinary retention or frequency.  No nocturia. Musculoskeletal: No neck pain, back pain Integumentary: No rash, pruritus, skin lesions Neurological: as above Psychiatric: No depression at this time.  No anxiety Endocrine: No palpitations, diaphoresis, change in appetite, change in weigh or increased thirst Hematologic/Lymphatic: No anemia, purpura, petechiae. Allergic/Immunologic: No itchy/runny eyes, nasal congestion, recent allergic reactions, rashes  ALLERGIES: Allergies  Allergen Reactions  . Epinephrine Other (See Comments)    Pt states she is "sensitive" to epinephrine.   . Latex Dermatitis    HOME MEDICATIONS:  Current Outpatient Medications:  .  acetaminophen (TYLENOL) 500 MG tablet, Take 1,000 mg by mouth 2 (two) times daily. , Disp: , Rfl:  .  albuterol (PROVENTIL HFA;VENTOLIN HFA) 108 (90 Base) MCG/ACT inhaler, Inhale 2 puffs into the lungs every 6 (six) hours as needed for wheezing or shortness of breath. , Disp: , Rfl:  .  Catheters (SELF-CATH SYSTEM STRAIGHT TIP) KIT, 1 Device by Does not apply route as needed., Disp: 50 each, Rfl: 11 .  ibuprofen (ADVIL,MOTRIN) 600 MG tablet, Take 800 mg by mouth every 6 (six) hours as needed., Disp: , Rfl:  .  mirabegron ER (MYRBETRIQ) 50 MG TB24 tablet, Take 1 nightly, Disp: 30 tablet, Rfl: 11 .  phentermine 37.5 MG capsule, Take 1 capsule (37.5 mg  total) by mouth every morning., Disp: 30 capsule, Rfl: 2  PAST MEDICAL HISTORY: Past Medical History:  Diagnosis Date  . Allergic rhinitis   . Anemia   . Asthma    last used inhaler 1 month ago  . Dysphagia   . Dysrhythmia    SVT  . Female bladder prolapse   . GERD (gastroesophageal reflux disease)    with pregnancy only  . Pelvic pain in antepartum period in second trimester 01/17/2014  . Pregnant 08/01/2015  . PSVT (paroxysmal supraventricular tachycardia) ([place])  could not be confirmed with 2016 study  . Rectocele   . Urethral prolapse   . Uterine prolapse   . Vaginal Pap smear, abnormal     PAST SURGICAL HISTORY: Past Surgical History:  Procedure Laterality Date  . CYSTOCELE REPAIR N/A 09/17/2017   Procedure: ANTERIOR REPAIR (CYSTOCELE);  Surgeon: Florian Buff, MD;  Location: [facility];  Service: Gynecology;  Laterality: N/A;  . SUPRAVENTRICULAR TACHYCARDIA ABLATION  01/05/2015   unsuccessful  . SUPRAVENTRICULAR TACHYCARDIA ABLATION N/A 01/05/2015   Procedure: SUPRAVENTRICULAR TACHYCARDIA ABLATION;  Surgeon: Evans Lance, MD;  Location: [facility] CATH LAB;  Service: Cardiovascular;  Laterality: N/A;  . VAGINAL HYSTERECTOMY N/A 09/17/2017   Procedure: HYSTERECTOMY VAGINAL WITH POSSIBLE BILATERAL SALPINGECTOMY;  Surgeon: Florian Buff, MD;  Location: [facility];  Service: Gynecology;  Laterality: N/A;    FAMILY HISTORY: Family History  Problem Relation Age of Onset  . Hypertension Mother   . Hyperlipidemia Mother   . Multiple sclerosis Mother   . COPD Mother   . Cancer Mother        melanoma x 2  . Diabetes Mother   . Heart disease Father        PSVT  . Hyperlipidemia Brother   . Hypertension Brother   . Heart disease Maternal Grandfather        heart attack  . Cancer Paternal Grandmother        pancreatic cancer  . Heart disease Paternal Grandmother        MI  . Hyperlipidemia Paternal Grandmother   . Hypertension Paternal Grandmother   . Asthma Daughter   . Asthma  Son   . Autism Son   . ADD / ADHD Son   . Tourette syndrome Son   . Other Son        Neurological and mental issues  . ADD / ADHD Son     SOCIAL HISTORY:  Social History   Socioeconomic History  . Marital status: Married    Spouse name: Not on file  . Number of children: 6  . Years of education: HS  . Highest education level: Not on file  Occupational History  . Occupation: Programme researcher, broadcasting/film/video  Social Needs  . Financial resource strain: Not on file  . Food insecurity:    Worry: Not on file    Inability: Not on file  . Transportation needs:    Medical: Not on file    Non-medical: Not on file  Tobacco Use  . Smoking status: Never Smoker  . Smokeless tobacco: Never Used  Substance and Sexual Activity  . Alcohol use: No  . Drug use: No  . Sexual activity: Yes    Birth control/protection: Surgical    Comment: hyst  Lifestyle  . Physical activity:    Days per week: Not on file    Minutes per session: Not on file  . Stress: Not on file  Relationships  . Social connections:    Talks on phone: Not on file    Gets together: Not on file    Attends religious service: Not on file    Active member of club or organization: Not on file    Attends meetings of clubs or organizations: Not on file    Relationship status: Not on file  . Intimate partner violence:    Fear of current or ex partner: Not on file    Emotionally abused: Not on file    Physically abused: Not on file    Forced  sexual activity: Not on file  Other Topics Concern  . Not on file  Social History Narrative   Lives with husband   Right handed    Caffeine use: 1-2 diet cokes per day     PHYSICAL EXAM  Vitals:   03/09/19 1513  BP: 110/70  Pulse: 83  SpO2: 98%  Weight: 169 lb 8 oz (76.9 kg)  Height: _0  (1.6 m)    Body mass index is 30.03 kg/m.   General: The patient is well-developed and well-nourished and in no acute distress  Eyes:  Funduscopic exam shows normal optic  discs and retinal vessels.  Neck: The neck is supple, no carotid bruits are noted.  The neck is nontender.  Cardiovascular: The heart has a regular rate and rhythm with a normal S1 and S2. There were no murmurs, gallops or rubs. Lungs are clear to auscultation.  Skin: Extremities are without significant edema.  Musculoskeletal:  Back is nontender  Neurologic Exam  Mental status: The patient is alert and oriented x 3 at the time of the examination. The patient has apparent normal recent and remote memory, with an apparently normal attention span and concentration ability.   Speech is normal.  Cranial nerves: Extraocular movements are full. Pupils are equal, round, and reactive to light and accomodation.  Visual fields are full.  Facial symmetry is present. There is good facial sensation to soft touch bilaterally.Facial strength is normal.  Trapezius and sternocleidomastoid strength is normal. No dysarthria is noted.  The tongue is midline, and the patient has symmetric elevation of the soft palate. No obvious hearing deficits are noted.  Motor:  Muscle bulk is normal.   Tone is normal. Strength is  5 / 5 in all 4 extremities.   Sensory: Sensory testing is intact to pinprick, soft touch and vibration sensation in the right arm and  extremities except for mildly reduced sensation to touch in the S1 distribution of the left foot  Coordination: Cerebellar testing reveals good finger-nose-finger and heel-to-shin bilaterally.  Gait and station: Station is normal.   Gait is normal. Tandem gait is normal. Romberg is negative.   Reflexes: Deep tendon reflexes are symmetric and normal in arms and 3+ in legs and symmetric.   No clonus.   Plantar responses are flexor.    DIAGNOSTIC DATA (LABS, IMAGING, TESTING) - I reviewed patient records, labs, notes, testing and imaging myself where available.  Lab Results  Component Value Date   WBC 10.6 (H) 02/13/2019   HGB 13.2 02/13/2019   HCT 41.7  02/13/2019   MCV 85.1 02/13/2019   PLT 256 02/13/2019      Component Value Date/Time   NA 138 02/13/2019 0604   NA 141 09/10/2017 1643   K 3.7 02/13/2019 0604   CL 106 02/13/2019 0604   CO2 23 02/13/2019 0604   GLUCOSE 106 (H) 02/13/2019 0604   BUN 8 02/13/2019 0604   BUN 10 09/10/2017 1643   CREATININE 0.69 02/13/2019 0604   CALCIUM 9.0 02/13/2019 0604   PROT 7.4 09/15/2017 1328   PROT 6.8 09/10/2017 1643   ALBUMIN 4.2 09/15/2017 1328   ALBUMIN 4.5 09/10/2017 1643   AST 14 (L) 09/15/2017 1328   ALT 23 09/15/2017 1328   ALKPHOS 81 09/15/2017 1328   BILITOT 0.4 09/15/2017 1328   BILITOT 0.4 09/10/2017 1643   GFRNONAA >60 02/13/2019 0604   GFRAA >60 02/13/2019 0604    Lab Results  Component Value Date   TSH 1.760 09/10/2017  ASSESSMENT AND PLAN  Urinary retention  Numbness  Migraine without status migrainosus, not intractable, unspecified migraine type  Fall, initial encounter   1.    She will keep the appointment for the MRI of the brain and cervical and thoracic spine next week.  This is needed to evaluate her urinary retention, numbness and gait issues.  Symptoms worsened this weekend after fall but she is doing better again.  She is advised to go straight to the emergency room if she has significant worsening between now and the time of her MRI.  We will let her know the results and do further evaluation or treatment based on the findings. 2.    Continue phentermine.   She will continue doing the catheterization per urology. 3.    She will keep her follow-up visit but we may bring her in sooner if the MRI shows MS to begin a disease modifying therapy or consider referral to surgery if there is a mechanical issue explaining some of her symptoms.  40-minute face-to-face evaluation with greater than one half the time counseling coordinating care about her new symptoms developing over the weekend. Kemari Narez A. Felecia Shelling, MD, PhD, FAAN Certified in Neurology,  Clinical Neurophysiology, Sleep Medicine, Pain Medicine and Neuroimaging Director, [facility] [address], [place], [place] [address] [phone]

## 2019-03-15 NOTE — Telephone Encounter (Signed)
Just an FYI  I spoke to the patient and informed her that due to our office protocol we will not be having MRI's for the time being.. she requested that it would be done at [facility]. She is scheduled for 04/15/19 to arrive at 2:30 pm. Patient is aware of time & day she also has their number if she needed to r/s of [phone].

## 2019-03-16 ENCOUNTER — Other Ambulatory Visit: Payer: Self-pay

## 2019-03-16 ENCOUNTER — Telehealth: Payer: Self-pay | Admitting: Neurology

## 2019-03-16 NOTE — Telephone Encounter (Signed)
Spoke to pt and relayed that per Dr. Epimenio Foot it is ok to take medrol dose pack.  She verbalized understanding and was very thankful.

## 2019-03-16 NOTE — Telephone Encounter (Signed)
Dr Epimenio Foot- please advise  I called pt back to get more info. She had dental procedure done a few weeks ago. Since, she has had worsening sx r/t trigeminal neuralgia. More pain around eye, nose, jaw and ear on right side. Feels "on fire". She just spoke with dental office who recommends medrol dose pak. She wanted to make sure Dr. Epimenio Foot agreed and if she should take this. Advised I will send him a message and call back with recommendation.

## 2019-03-16 NOTE — Telephone Encounter (Signed)
Pt states her dentist wants to put her on Medrol and she would like to know if it is ok to be put on it. Please advise.

## 2019-03-16 NOTE — Telephone Encounter (Signed)
I called pt right after unaware Andrey Campanile, RN already called. Pt was appreciative and will call back if any further questions/concerns.

## 2019-03-16 NOTE — Telephone Encounter (Signed)
A medrol dose pak is fine

## 2019-03-24 NOTE — Telephone Encounter (Signed)
Patient MRI's were moved up to 03/26/19 at [facility]. Patient is aware to arrive at [facility] at 1:30 pm

## 2019-03-26 ENCOUNTER — Ambulatory Visit (HOSPITAL_COMMUNITY)
Admission: RE | Admit: 2019-03-26 | Discharge: 2019-03-26 | Disposition: A | Discharge status: Home / Self Care | Payer: Self-pay | Source: Ambulatory Visit | Attending: Neurology | Admitting: Neurology

## 2019-03-26 ENCOUNTER — Other Ambulatory Visit: Payer: Self-pay

## 2019-03-26 ENCOUNTER — Other Ambulatory Visit: Payer: Self-pay | Admitting: Obstetrics & Gynecology

## 2019-03-26 DIAGNOSIS — R339 Retention of urine, unspecified: Secondary | ICD-10-CM

## 2019-03-26 DIAGNOSIS — R29818 Other symptoms and signs involving the nervous system: Secondary | ICD-10-CM

## 2019-03-26 DIAGNOSIS — R200 Anesthesia of skin: Secondary | ICD-10-CM

## 2019-03-26 DIAGNOSIS — R1310 Dysphagia, unspecified: Secondary | ICD-10-CM

## 2019-03-26 MED ORDER — GADOBUTROL 1 MMOL/ML IV SOLN
7.5000 mL | Freq: Once | INTRAVENOUS | Status: AC | PRN
  Administered 2019-03-26: 7.5 mL via INTRAVENOUS

## 2019-03-26 MED ORDER — MIRABEGRON ER 50 MG PO TB24: ORAL_TABLET | ORAL | 11 refills | Status: DC

## 2019-03-29 ENCOUNTER — Telehealth: Payer: Self-pay | Admitting: *Deleted

## 2019-03-29 NOTE — Telephone Encounter (Signed)
-----   Message from Asa Lente, MD sent at 03/29/2019  9:01 AM EDT ----- Please let her know that the MRI of the brain and the spine was normal.  If urinary symptoms persist she should see urology or gynecology

## 2019-03-30 ENCOUNTER — Other Ambulatory Visit: Payer: Self-pay | Admitting: *Deleted

## 2019-03-30 ENCOUNTER — Encounter: Payer: Self-pay | Admitting: *Deleted

## 2019-03-30 ENCOUNTER — Ambulatory Visit (INDEPENDENT_AMBULATORY_CARE_PROVIDER_SITE_OTHER): Payer: Self-pay | Admitting: Family Medicine

## 2019-03-30 ENCOUNTER — Other Ambulatory Visit: Payer: Self-pay

## 2019-03-30 DIAGNOSIS — N319 Neuromuscular dysfunction of bladder, unspecified: Secondary | ICD-10-CM

## 2019-03-30 DIAGNOSIS — G629 Polyneuropathy, unspecified: Secondary | ICD-10-CM

## 2019-03-30 DIAGNOSIS — G43909 Migraine, unspecified, not intractable, without status migrainosus: Secondary | ICD-10-CM

## 2019-03-30 DIAGNOSIS — R200 Anesthesia of skin: Secondary | ICD-10-CM

## 2019-03-30 DIAGNOSIS — D649 Anemia, unspecified: Secondary | ICD-10-CM

## 2019-03-30 DIAGNOSIS — R339 Retention of urine, unspecified: Secondary | ICD-10-CM

## 2019-03-30 NOTE — Progress Notes (Signed)
Subjective:    Patient ID: Sharon Rowland, female    DOB: 11/08/1986, 33 y.o.   MRN: [id number]  HPI  Patient is for a follow up on dysphagia and neurogenic bladder. Patient states she has seen specialist for her bladder and it has helped some but still having some problems and she still has to be careful with her swallowing. This is a very nice patient She is having some difficult times Having some various neurologic symptoms that affect sometimes her swallowing as well as her legs as well as bladder emptying. She is seen neurology who did an evaluation for MS by doing a MRI of the brain, cervical spine, and thoracic spine.  All of these MRIs were normal. She also saw her gynecologist who is done evaluation as well.  She has not seen urologist at this point.  She states that her gynecologist stated to her that he has subspecialty with bladder function/urology. The patient is concerned that some of this could be psychogenic.  She states she does not feel stressed she denies being depressed she does see a psychologist on a regular basis.  She relates that she feels most days she has difficult time with the bladder function as well as intermittent neurologic symptoms in the arms swallowing legs. 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 Virtual Visit via Video Note  I connected with Sharon RudJennifer Pondexter on 03/30/19 at  9:00 AM EDT by a video enabled telemedicine application and verified that I am speaking with the correct person using two identifiers.   I discussed the limitations of evaluation and management by telemedicine and the availability of in person appointments. The patient expressed understanding and agreed to proceed.  History of Present Illness:    Observations/Objective:   Assessment and Plan:   Follow Up Instructions:    I discussed the assessment and treatment plan with  the patient. The patient was provided an opportunity to ask questions and all were answered. The patient agreed with the plan and demonstrated an understanding of the instructions.   The patient was advised to call back or seek an in-person evaluation if the symptoms worsen or if the condition fails to improve as anticipated.  I provided 18 minutes of non-face-to-face time during this encounter.  Had virtual visit with us via video.  [place] [facility] [place] We were at our office.   Review of Systems     Objective:   Physical Exam  Patient does not appear in acute distress.  Unable to do physical exam via video      Assessment & Plan:  Patient will be seeing her psychologist next week for further evaluation.  She will discuss with the psychologist the possibility of psychogenic symptomatology.  I do not feel the patient is a Radiographer, therapeuticmalingerer.  I do not feel that she is making this up.  Based on what she is telling me I doubt that this is due to psychiatric issues such as depression or anxiety  We will communicate with her neurologist to see if any further intervention is recommended by them.  If they feel that everything has been looked at patient may need to consider other options.  Patient is considering the possibility of second opinion with neurology which is reasonable.  As for her intermittent bladder catheterization along with MRIs been negative so far I would recommend the  patient be seen by urology either in [facility] for urologic testing the patient will consider this and let us know.  The patient will do lab work including CBC and B12 level to see if any of this is playing a role into her neurologic symptoms.  The patient will keep [place] updated

## 2019-04-08 ENCOUNTER — Ambulatory Visit: Payer: Self-pay | Admitting: Obstetrics & Gynecology

## 2019-04-12 ENCOUNTER — Encounter: Payer: Self-pay | Admitting: Family Medicine

## 2019-04-13 ENCOUNTER — Encounter: Payer: Self-pay | Admitting: Family Medicine

## 2019-04-15 ENCOUNTER — Other Ambulatory Visit (HOSPITAL_COMMUNITY): Payer: Self-pay

## 2019-04-15 ENCOUNTER — Ambulatory Visit (HOSPITAL_COMMUNITY): Payer: Self-pay

## 2019-04-16 ENCOUNTER — Other Ambulatory Visit (INDEPENDENT_AMBULATORY_CARE_PROVIDER_SITE_OTHER): Payer: Self-pay | Admitting: *Deleted

## 2019-04-16 ENCOUNTER — Other Ambulatory Visit: Payer: Self-pay

## 2019-04-16 DIAGNOSIS — R339 Retention of urine, unspecified: Secondary | ICD-10-CM

## 2019-04-16 NOTE — Progress Notes (Signed)
Pt came by office and left urine sample. Urine sent to lab for culture. JSY 

## 2019-04-18 LAB — URINE CULTURE

## 2019-04-20 ENCOUNTER — Other Ambulatory Visit: Payer: Self-pay

## 2019-04-20 ENCOUNTER — Ambulatory Visit (INDEPENDENT_AMBULATORY_CARE_PROVIDER_SITE_OTHER): Payer: Self-pay | Admitting: Family Medicine

## 2019-04-20 ENCOUNTER — Ambulatory Visit: Payer: Self-pay | Admitting: Neurology

## 2019-04-20 DIAGNOSIS — R300 Dysuria: Secondary | ICD-10-CM

## 2019-04-20 DIAGNOSIS — N3000 Acute cystitis without hematuria: Secondary | ICD-10-CM

## 2019-04-20 NOTE — Progress Notes (Signed)
   Subjective:    Patient ID: Ailene Rud, female    DOB: July 11, 1986, 33 y.o.   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 Patient states she gets frequent UTIs.  She relates that she does do catheterizations rated as a result sometimes her catheterization cultures will be mixed flora.  She has not had any hematuria recently.  She did see a neurology specialist with [facility] they were setting her up with a urologist but it will be several weeks till she can be seen  Virtual Visit via Video Note  I connected with Ailene Rud on 04/20/19 at  2:30 PM EDT by a video enabled telemedicine application and verified that I am speaking with the correct person using two identifiers.   I discussed the limitations of evaluation and management by telemedicine and the availability of in person appointments. The patient expressed understanding and agreed to proceed.  History of Present Illness:    Observations/Objective:   Assessment and Plan:   Follow Up Instructions:    I discussed the assessment and treatment plan with the patient. The patient was provided an opportunity to ask questions and all were answered. The patient agreed with the plan and demonstrated an understanding of the instructions.   The patient was advised to call back or seek an in-person evaluation if the symptoms worsen or if the condition fails to improve as anticipated.  I provided 15 minutes of non-face-to-face time during this encounter.      Review of Systems     Objective:   Physical Exam        Assessment & Plan:  Patient does have complex UTIs we did have a discussion regarding the  difficulties of this.  Most recent urine culture showed minimal growth In the past she has had mixed colonies but she does do catheterizations  She is currently on the Cipro twice daily for 1 week hopefully that will be enough  Ideally I would not want her on Macrobid as a suppressing dose because of association with pulmonary fibrosis she will stop the nitrofurantoin for now  New neurologist with [facility] did make a referral for her to see urology she is awaiting the consultation with them

## 2019-04-21 ENCOUNTER — Ambulatory Visit: Payer: Self-pay | Admitting: Gastroenterology

## 2019-04-23 ENCOUNTER — Ambulatory Visit: Payer: Self-pay | Admitting: Gastroenterology

## 2019-04-26 ENCOUNTER — Other Ambulatory Visit
Admission: RE | Admit: 2019-04-26 | Discharge: 2019-04-26 | Disposition: A | Discharge status: Home / Self Care | Payer: Self-pay | Source: Ambulatory Visit | Attending: Neurology | Admitting: Neurology

## 2019-04-26 ENCOUNTER — Other Ambulatory Visit: Payer: Self-pay | Admitting: Neurology

## 2019-04-26 DIAGNOSIS — R200 Anesthesia of skin: Secondary | ICD-10-CM

## 2019-04-26 DIAGNOSIS — N319 Neuromuscular dysfunction of bladder, unspecified: Secondary | ICD-10-CM | POA: Insufficient documentation

## 2019-04-26 DIAGNOSIS — R531 Weakness: Secondary | ICD-10-CM | POA: Insufficient documentation

## 2019-04-26 LAB — APTT: aPTT: 28 seconds (ref 24–36)

## 2019-04-27 ENCOUNTER — Other Ambulatory Visit: Payer: Self-pay

## 2019-04-27 ENCOUNTER — Ambulatory Visit
Admission: RE | Admit: 2019-04-27 | Discharge: 2019-04-27 | Disposition: A | Discharge status: Home / Self Care | Payer: Self-pay | Source: Ambulatory Visit | Attending: Neurology | Admitting: Neurology

## 2019-04-27 DIAGNOSIS — G35 Multiple sclerosis: Secondary | ICD-10-CM | POA: Insufficient documentation

## 2019-04-27 DIAGNOSIS — R200 Anesthesia of skin: Secondary | ICD-10-CM

## 2019-04-27 LAB — GLUCOSE, CSF: Glucose, CSF: 63 mg/dL (ref 40–70)

## 2019-04-27 LAB — CBC WITH DIFFERENTIAL/PLATELET
Abs Immature Granulocytes: 0.02 10*3/uL (ref 0.00–0.07)
Basophils Absolute: 0 10*3/uL (ref 0.0–0.1)
Basophils Relative: 0 %
Eosinophils Absolute: 0.2 10*3/uL (ref 0.0–0.5)
Eosinophils Relative: 2 %
HCT: 37.5 % (ref 36.0–46.0)
Hemoglobin: 12.6 g/dL (ref 12.0–15.0)
Immature Granulocytes: 0 %
Lymphocytes Relative: 29 %
Lymphs Abs: 2.4 10*3/uL (ref 0.7–4.0)
MCH: 28.1 pg (ref 26.0–34.0)
MCHC: 33.6 g/dL (ref 30.0–36.0)
MCV: 83.7 fL (ref 80.0–100.0)
Monocytes Absolute: 0.6 10*3/uL (ref 0.1–1.0)
Monocytes Relative: 8 %
Neutro Abs: 5 10*3/uL (ref 1.7–7.7)
Neutrophils Relative %: 61 %
Platelets: 247 10*3/uL (ref 150–400)
RBC: 4.48 MIL/uL (ref 3.87–5.11)
RDW: 13.4 % (ref 11.5–15.5)
Smear Review: NORMAL
WBC: 8.2 10*3/uL (ref 4.0–10.5)
nRBC: 0 % (ref 0.0–0.2)

## 2019-04-27 LAB — PROTIME-INR
INR: 1 (ref 0.8–1.2)
Prothrombin Time: 12.6 seconds (ref 11.4–15.2)

## 2019-04-27 LAB — CSF CELL COUNT WITH DIFFERENTIAL
RBC Count, CSF: 0 /mm3 (ref 0–3)
Tube #: 3
WBC, CSF: 0 /mm3 (ref 0–5)

## 2019-04-27 LAB — APTT: aPTT: 27 seconds (ref 24–36)

## 2019-04-27 LAB — GLUCOSE, RANDOM: Glucose, Bld: 104 mg/dL — ABNORMAL HIGH (ref 70–99)

## 2019-04-27 LAB — PROTEIN, CSF: Total  Protein, CSF: 27 mg/dL (ref 15–45)

## 2019-04-27 MED ORDER — ACETAMINOPHEN 325 MG PO TABS
650.0000 mg | ORAL_TABLET | ORAL | Status: DC | PRN
  Filled 2019-04-27: qty 2

## 2019-04-27 MED ORDER — LIDOCAINE HCL (PF) 1 % IJ SOLN
5.0000 mL | Freq: Once | INTRAMUSCULAR | Status: DC
  Filled 2019-04-27: qty 5

## 2019-04-27 NOTE — Procedures (Signed)
Interventional Radiology Procedure Note  Procedure: LP under fluoroscopy   Complications: None  Estimated Blood Loss: None  Findings: LP at L3 level with 20 G spinal needle. Return of clear CSF. Opening pressure of approximately 13-14 cm H2O.  14 mL CSF collected in 4 vials. Closing pressure <9 cm H2O.  Jodi Marble. Fredia Sorrow, M.D Pager:  [phone]

## 2019-04-29 ENCOUNTER — Encounter: Payer: Self-pay | Admitting: Family Medicine

## 2019-04-29 LAB — IGG CSF INDEX
Albumin CSF-mCnc: 16 mg/dL (ref 11–48)
Albumin: 4.5 g/dL (ref 3.8–4.8)
CSF IgG Index: 0.5 (ref 0.0–0.7)
IgG (Immunoglobin G), Serum: 936 mg/dL (ref 586–1602)
IgG, CSF: 1.7 mg/dL (ref 0.0–8.6)
IgG/Alb Ratio, CSF: 0.11 (ref 0.00–0.25)

## 2019-04-29 LAB — OLIGOCLONAL BANDS, CSF + SERM

## 2019-05-01 LAB — CSF CULTURE W GRAM STAIN
Culture: NO GROWTH
Gram Stain: NONE SEEN

## 2019-05-13 ENCOUNTER — Telehealth: Payer: Self-pay | Admitting: Neurology

## 2019-05-13 NOTE — Telephone Encounter (Signed)
Slaugenhaupt, Terri L, CCC-SLP  [facility], Delene Ruffini: We called your patient and she has declined to schedule an appointment with [place].  She feels she does not need counseling.   We appreciate the referral.  [facility]

## 2019-05-14 ENCOUNTER — Encounter: Payer: Self-pay | Admitting: Family Medicine

## 2019-05-16 ENCOUNTER — Emergency Department (HOSPITAL_COMMUNITY)
Admission: EM | Admit: 2019-05-16 | Discharge: 2019-05-16 | Disposition: A | Discharge status: Home / Self Care | Payer: Self-pay | Attending: Emergency Medicine | Admitting: Emergency Medicine

## 2019-05-16 ENCOUNTER — Other Ambulatory Visit: Payer: Self-pay

## 2019-05-16 ENCOUNTER — Encounter (HOSPITAL_COMMUNITY): Payer: Self-pay

## 2019-05-16 DIAGNOSIS — J45909 Unspecified asthma, uncomplicated: Secondary | ICD-10-CM | POA: Insufficient documentation

## 2019-05-16 DIAGNOSIS — Z79899 Other long term (current) drug therapy: Secondary | ICD-10-CM | POA: Insufficient documentation

## 2019-05-16 DIAGNOSIS — R39198 Other difficulties with micturition: Secondary | ICD-10-CM | POA: Insufficient documentation

## 2019-05-16 DIAGNOSIS — R5383 Other fatigue: Secondary | ICD-10-CM | POA: Insufficient documentation

## 2019-05-16 DIAGNOSIS — R531 Weakness: Secondary | ICD-10-CM | POA: Insufficient documentation

## 2019-05-16 LAB — CBC WITH DIFFERENTIAL/PLATELET
Abs Immature Granulocytes: 0.01 10*3/uL (ref 0.00–0.07)
Basophils Absolute: 0 10*3/uL (ref 0.0–0.1)
Basophils Relative: 0 %
Eosinophils Absolute: 0 10*3/uL (ref 0.0–0.5)
Eosinophils Relative: 0 %
HCT: 41 % (ref 36.0–46.0)
Hemoglobin: 12.9 g/dL (ref 12.0–15.0)
Immature Granulocytes: 0 %
Lymphocytes Relative: 32 %
Lymphs Abs: 2.6 10*3/uL (ref 0.7–4.0)
MCH: 27.5 pg (ref 26.0–34.0)
MCHC: 31.5 g/dL (ref 30.0–36.0)
MCV: 87.4 fL (ref 80.0–100.0)
Monocytes Absolute: 0.6 10*3/uL (ref 0.1–1.0)
Monocytes Relative: 8 %
Neutro Abs: 5 10*3/uL (ref 1.7–7.7)
Neutrophils Relative %: 60 %
Platelets: 236 10*3/uL (ref 150–400)
RBC: 4.69 MIL/uL (ref 3.87–5.11)
RDW: 13.2 % (ref 11.5–15.5)
WBC: 8.3 10*3/uL (ref 4.0–10.5)
nRBC: 0 % (ref 0.0–0.2)

## 2019-05-16 LAB — COMPREHENSIVE METABOLIC PANEL
ALT: 13 U/L (ref 0–44)
AST: 10 U/L — ABNORMAL LOW (ref 15–41)
Albumin: 4.8 g/dL (ref 3.5–5.0)
Alkaline Phosphatase: 57 U/L (ref 38–126)
Anion gap: 10 (ref 5–15)
BUN: 6 mg/dL (ref 6–20)
CO2: 23 mmol/L (ref 22–32)
Calcium: 9 mg/dL (ref 8.9–10.3)
Chloride: 105 mmol/L (ref 98–111)
Creatinine, Ser: 0.59 mg/dL (ref 0.44–1.00)
GFR calc Af Amer: >60 mL/min (ref >60)
GFR calc non Af Amer: >60 mL/min (ref >60)
Glucose, Bld: 102 mg/dL — ABNORMAL HIGH (ref 70–99)
Potassium: 3.6 mmol/L (ref 3.5–5.1)
Sodium: 138 mmol/L (ref 135–145)
Total Bilirubin: 0.7 mg/dL (ref 0.3–1.2)
Total Protein: 7.8 g/dL (ref 6.5–8.1)

## 2019-05-16 LAB — TROPONIN I: Troponin I: <0.03 ng/mL (ref <0.03)

## 2019-05-16 LAB — TSH: TSH: 3.661 u[IU]/mL (ref 0.350–4.500)

## 2019-05-16 LAB — T4, FREE: Free T4: 0.72 ng/dL — ABNORMAL LOW (ref 0.82–1.77)

## 2019-05-16 NOTE — ED Triage Notes (Signed)
Pt reports hasn't felt well since Feb.  Reports is undergoing tests to rule out MS and has hypothyroid.  Reports started on a beta blocker approx 1 week ago.  Reports fatigue intensified after that.  Pt also says has neurogenic bladder and has to self cath.

## 2019-05-16 NOTE — ED Provider Notes (Signed)
[facility] EMERGENCY DEPARTMENT Provider Note   CSN: [id number] Arrival date & time: 05/16/19  1722    History   Chief Complaint Chief Complaint  Patient presents with   Fatigue    HPI Sharon Rowland is a 33 y.o. female.  She has been having neurologic symptoms and fatigue ongoing for many months.  She is having an MS work-up by neurology and recently had some abnormal thyroid test and is awaiting referral to endocrinology.  She has had problems with neurogenic bladder and incontinence and needs to self cath.  She said for the past few days she is just been feeling very unwell and more fatigued than her baseline.  They recently started on a beta-blocker to help with possible migraine symptoms and her symptoms have been worse.  She had been on beta-blocker in the past and did not cause her this degree of symptoms.  No fevers or chills no cough no abdominal pain vomiting or diarrhea.  She has had a hysterectomy and does not have any vaginal bleeding or discharge.     The history is provided by the patient.  Weakness  Severity:  Severe Onset quality:  Gradual Timing:  Intermittent Progression:  Waxing and waning Chronicity:  Chronic Context: change in medication   Relieved by:  Nothing Worsened by:  Activity Ineffective treatments:  Medication Associated symptoms: no abdominal pain, no aphasia, no chest pain, no cough, no diarrhea, no dysuria, no fever and no shortness of breath   Risk factors: neurologic disease and new medications     Past Medical History:  Diagnosis Date   Allergic rhinitis    Anemia    Asthma    last used inhaler 1 month ago   Dysphagia    Dysrhythmia    SVT   Female bladder prolapse    GERD (gastroesophageal reflux disease)    with pregnancy only   Pelvic pain in antepartum period in second trimester 01/17/2014   Pregnant 08/01/2015   PSVT (paroxysmal supraventricular tachycardia) ([place])    could not be confirmed with 2016 study    Rectocele    Urethral prolapse    Uterine prolapse    Vaginal Pap smear, abnormal     Patient Active Problem List   Diagnosis Date Noted   Fall 03/09/2019   Urinary retention 03/04/2019   Numbness 03/04/2019   Lhermitte's sign positive 03/04/2019   Dysphagia 03/04/2019   Acute nasopharyngitis 11/25/2018   Hematuria 11/25/2018   Burning with urination 11/25/2018   S/P vaginal hysterectomy 09/17/2017   Normal labor 08/10/2017   Uterine/bladder prolapse 02/13/2017   History of recurrent UTI (urinary tract infection) 02/27/2016   Migraines 10/03/2015   Supervision of normal pregnancy 09/05/2015   Trigger point of shoulder region 06/20/2015   Rh negative state in antepartum period 01/31/2014   Abnormal Pap smear of cervix 01/31/2014   Paroxysmal SVT (supraventricular tachycardia) ([place]) 11/09/2013   Asthma, stable 11/09/2013    Past Surgical History:  Procedure Laterality Date   ANTERIOR AND POSTERIOR VAGINAL REPAIR     CYSTOCELE REPAIR N/A 09/17/2017   Procedure: ANTERIOR REPAIR (CYSTOCELE);  Surgeon: Florian Buff, MD;  Location: [facility];  Service: Gynecology;  Laterality: N/A;   SUPRAVENTRICULAR TACHYCARDIA ABLATION  01/05/2015   unsuccessful   SUPRAVENTRICULAR TACHYCARDIA ABLATION N/A 01/05/2015   Procedure: SUPRAVENTRICULAR TACHYCARDIA ABLATION;  Surgeon: Evans Lance, MD;  Location: [facility] CATH LAB;  Service: Cardiovascular;  Laterality: N/A;   VAGINAL HYSTERECTOMY N/A 09/17/2017   Procedure: HYSTERECTOMY VAGINAL  WITH POSSIBLE BILATERAL SALPINGECTOMY;  Surgeon: Florian Buff, MD;  Location: [facility];  Service: Gynecology;  Laterality: N/A;     OB History    Gravida  6   Para  6   Term  6   Preterm      AB      Living  6     SAB      TAB      Ectopic      Multiple  0   Live Births  6            Home Medications    Prior to Admission medications   Medication Sig Start Date End Date Taking? Authorizing Provider    acetaminophen (TYLENOL) 500 MG tablet Take 1,000 mg by mouth 2 (two) times daily.     [provider]  albuterol (PROVENTIL HFA;VENTOLIN HFA) 108 (90 Base) MCG/ACT inhaler Inhale 2 puffs into the lungs every 6 (six) hours as needed for wheezing or shortness of breath.     [provider]  Catheters (SELF-CATH SYSTEM STRAIGHT TIP) KIT 1 Device by Does not apply route as needed. 2/70/35   Delora Fuel, MD  ibuprofen (ADVIL,MOTRIN) 600 MG tablet Take 800 mg by mouth every 6 (six) hours as needed.    [provider]  mirabegron ER (MYRBETRIQ) 50 MG TB24 tablet Take 1 nightly 03/26/19   Florian Buff, MD  phentermine 37.5 MG capsule Take 1 capsule (37.5 mg total) by mouth every morning. 03/04/19   Sater, Nanine Means, MD    Family History Family History  Problem Relation Age of Onset   Hypertension Mother    Hyperlipidemia Mother    Multiple sclerosis Mother    COPD Mother    Cancer Mother        melanoma x 2   Diabetes Mother    Heart disease Father        PSVT   Hyperlipidemia Brother    Hypertension Brother    Heart disease Maternal Grandfather        heart attack   Cancer Paternal Grandmother        pancreatic cancer   Heart disease Paternal Grandmother        MI   Hyperlipidemia Paternal Grandmother    Hypertension Paternal Grandmother    Asthma Daughter    Asthma Son    Autism Son    ADD / ADHD Son    Tourette syndrome Son    Other Son        Neurological and mental issues   ADD / ADHD Son     Social History Social History   Tobacco Use   Smoking status: Never Smoker   Smokeless tobacco: Never Used  Substance Use Topics   Alcohol use: No   Drug use: No     Allergies   Epinephrine and Latex   Review of Systems Review of Systems  Constitutional: Positive for activity change and fatigue. Negative for fever.  HENT: Negative for sore throat.   Eyes: Negative for discharge.  Respiratory: Negative for cough and  shortness of breath.   Cardiovascular: Negative for chest pain.  Gastrointestinal: Negative for abdominal pain and diarrhea.  Genitourinary: Positive for difficulty urinating. Negative for dysuria.  Musculoskeletal: Negative for joint swelling.  Skin: Negative for rash.  Neurological: Positive for weakness.     Physical Exam Updated Vital Signs BP (!) 138/91 (BP Location: Right Arm)    Pulse 90    Temp 98  F (36.7 C) (Oral)    Resp 16    Ht '5\' 4"'$  (1.626 m)    Wt 74.8 kg    LMP 10/23/2016 (Approximate)    SpO2 99%    BMI 28.32 kg/m   Physical Exam Vitals signs and nursing note reviewed.  Constitutional:      General: She is not in acute distress.    Appearance: She is well-developed.  HENT:     Head: Normocephalic and atraumatic.  Eyes:     Conjunctiva/sclera: Conjunctivae normal.  Neck:     Musculoskeletal: Neck supple. No muscular tenderness.     Thyroid: No thyroid mass, thyromegaly or thyroid tenderness.     Trachea: Trachea and phonation normal.  Cardiovascular:     Rate and Rhythm: Normal rate and regular rhythm.     Pulses: Normal pulses.     Heart sounds: No murmur.  Pulmonary:     Effort: Pulmonary effort is normal. No respiratory distress.     Breath sounds: Normal breath sounds.  Abdominal:     Palpations: Abdomen is soft.     Tenderness: There is no abdominal tenderness.  Musculoskeletal: Normal range of motion.     Right lower leg: No edema.     Left lower leg: No edema.  Lymphadenopathy:     Cervical: No cervical adenopathy.  Skin:    General: Skin is warm and dry.     Capillary Refill: Capillary refill takes less than 2 seconds.  Neurological:     General: No focal deficit present.     Mental Status: She is alert and oriented to person, place, and time.      ED Treatments / Results  Labs (all labs ordered are listed, but only abnormal results are displayed) Labs Reviewed  COMPREHENSIVE METABOLIC PANEL - Abnormal; Notable for the following  components:      Result Value   Glucose, Bld 102 (*)    AST 10 (*)    All other components within normal limits  T4, FREE - Abnormal; Notable for the following components:   Free T4 0.72 (*)    All other components within normal limits  CBC WITH DIFFERENTIAL/PLATELET  TROPONIN I  TSH  T3  T4  T3 UPTAKE  T3, FREE    EKG EKG Interpretation  Date/Time:  Sunday May 16 2019 18:38:09 EDT Ventricular Rate:  70 PR Interval:    QRS Duration: 94 QT Interval:  380 QTC Calculation: 410 R Axis:   74 Text Interpretation:  Sinus rhythm RSR' in V1 or V2, probably normal variant similar to prior 9/18 Confirmed by Aletta Edouard [phone]) on 05/16/2019 6:41:42 PM   Radiology No results found.  Procedures Procedures (including critical care time)  Medications Ordered in ED Medications - No data to display   Initial Impression / Assessment and Plan / ED Course  I have reviewed the triage vital signs and the nursing notes.  Pertinent labs & imaging results that were available during my care of the patient were reviewed by me and considered in my medical decision making (see chart for details).  Clinical Course as of May 16 834  Sun May 16, 2019  1929 I reviewed the current work-up with the patient and let her know that her thyroid test will not result quickly tonight.  She said she does not want to stick around to do urinalysis so she does not think that is her problem.  I recommended that she follow-up with her primary care doctor on  Tuesday and possibly the thyroid test might be able to guide him on starting on some treatment.  She understands to return if any worsening symptoms.   [MB]    Clinical Course User Index [MB] Hayden Rasmussen, MD       Final Clinical Impressions(s) / ED Diagnoses   Final diagnoses:  Fatigue, unspecified type    ED Discharge Orders    None       Hayden Rasmussen, MD 05/17/19 [phone]

## 2019-05-16 NOTE — Discharge Instructions (Signed)
You were evaluated in the emergency department for fatigue and not feeling well.  You had an ongoing work-up with your neurologist and are awaiting follow-up with endocrinology.  You had blood work and an EKG here that did not show an obvious explanation for your symptoms.  It will be important for you to follow-up with your doctors and there are still some tests pending that were drawn today that will also need to be followed up on.  Please return if any worsening symptoms.

## 2019-05-18 ENCOUNTER — Other Ambulatory Visit: Payer: Self-pay

## 2019-05-18 ENCOUNTER — Ambulatory Visit (INDEPENDENT_AMBULATORY_CARE_PROVIDER_SITE_OTHER): Payer: Self-pay | Admitting: Family Medicine

## 2019-05-18 DIAGNOSIS — R5383 Other fatigue: Secondary | ICD-10-CM

## 2019-05-18 DIAGNOSIS — R768 Other specified abnormal immunological findings in serum: Secondary | ICD-10-CM

## 2019-05-18 LAB — T3, FREE: T3, Free: 3 pg/mL (ref 2.0–4.4)

## 2019-05-18 LAB — T3: T3, Total: 129 ng/dL (ref 71–180)

## 2019-05-18 LAB — T4: T4, Total: 6.9 ug/dL (ref 4.5–12.0)

## 2019-05-18 LAB — T3 UPTAKE: T3 Uptake Ratio: 18 % — ABNORMAL LOW (ref 24–39)

## 2019-05-18 MED ORDER — OXYBUTYNIN CHLORIDE ER 10 MG PO TB24: 10.0000 mg | ORAL_TABLET | Freq: Every day | ORAL | 5 refills | Status: DC

## 2019-05-18 NOTE — Progress Notes (Signed)
   Subjective:    Patient ID: Sharon Rowland, female    DOB: [birth year], 33 y.o.   MRN: [id number] Very nice patient, phone visit only video not capable with her equipment HPIFollow up ED visit. Went to ED on 5/24 for fatigue. Trouble swallowing and always feels like something is in throat.  Patient is very concerned She is having significant significant fatigue tiredness.  Also having some mild headaches low energy level feeling bad.  Patient had seen neurology specialist.  They ran additional testing which indicates possibility of Hashimoto's disease Because the patient felt so bad she went to the ER they ran additional tests and instructed her to follow-up with [place]  She is being also followed with neurology for the possibility of MS although they are doing a additional testing Virtual Visit via Telephone Note  I connected with Sharon Rowland on 05/18/19 at 10:00 AM EDT by telephone and verified that I am speaking with the correct person using two identifiers.  Location: Patient: home Provider: office   I discussed the limitations, risks, security and privacy concerns of performing an evaluation and management service by telephone and the availability of in person appointments. I also discussed with the patient that there may be a patient responsible charge related to this service. The patient expressed understanding and agreed to proceed.   History of Present Illness:    Observations/Objective:   Assessment and Plan:   Follow Up Instructions:    I discussed the assessment and treatment plan with the patient. The patient was provided an opportunity to ask questions and all were answered. The patient agreed with the plan and demonstrated an understanding of the instructions.   The patient was advised to call back or seek an in-person evaluation if the symptoms worsen or if the condition fails to improve as anticipated.   I provided 15 minutes of non-face-to-face time during this  encounter.       Review of Systems  Constitutional: Positive for fatigue. Negative for activity change and fever.  HENT: Negative for congestion and rhinorrhea.   Respiratory: Negative for cough, chest tightness and shortness of breath.   Cardiovascular: Negative for chest pain and leg swelling.  Gastrointestinal: Negative for abdominal pain and nausea.  Skin: Negative for color change.  Neurological: Negative for dizziness and headaches.  Psychiatric/Behavioral: Negative for agitation and behavioral problems.       Objective:   Physical Exam  Today's visit was via telephone Physical exam was not possible for this visit       Assessment & Plan:  Abnormal T4 Normal TSH Abnormal thyroid antibodies Possible Hashimoto's Because of patient's symptomatology I think it is best to go ahead and get an opinion from endocrinology.  We will connect with specialist to set up the appointment as soon as possible.  Patient wanted to start on levothyroxine but I feel that based upon these numbers it is best to see the endocrinologist first

## 2019-05-26 ENCOUNTER — Telehealth: Payer: Self-pay | Admitting: Obstetrics & Gynecology

## 2019-05-26 NOTE — Telephone Encounter (Signed)
Pt requesting an appt to be seen. She is fine with a webex or in person. States she is having bladder issues.

## 2019-05-26 NOTE — Telephone Encounter (Signed)
Pt having problems with bladder. Having pain, urgency and incontinence x 2 weeks. Pt wants to see Dr. Despina Hidden. Appt scheduled 6/16 @ 3:30. Pt voiced understanding. JSY

## 2019-05-27 ENCOUNTER — Encounter: Payer: Self-pay | Admitting: *Deleted

## 2019-05-27 ENCOUNTER — Telehealth: Payer: Self-pay | Admitting: Obstetrics & Gynecology

## 2019-05-27 NOTE — Telephone Encounter (Signed)
Pt states that she needs to be seen for a possible UTI. Pt states that it is messing with her nervous system. Would not like to see Drenda Freeze.

## 2019-05-27 NOTE — Telephone Encounter (Signed)
Patient states that she feels like she is having some bladder problems that can't wait until she sees Dr. Despina Hidden. Patient having pain and foul urine smell. Scheduled with Victorino Dike.   Patient informed we are still not allowing any visitors or children to come in during appointment time unless physical assistance is needed. Asked if has had any exposure to anyone suspected or confirmed of having COVID-19 or if she was experiencing any of the following, to reschedule: fever, cough, shortness of breath, muscle pain, diarrhea, rash, vomiting, abdominal pain, red eye, weakness, bruising, bleeding, joint pain, or a severe headache.  Stated no to all.  Asked that she complete E-check-in via mychart prior to arrival.  Advised to check-in via Hello Patient and call our office on arrival in our office parking lot to complete registration over the phone. Advised to also use the provided hand sanitizer when entering the office and to wear a mask if she has one, if not, we will provide one. Pt verbalized understanding.

## 2019-05-28 ENCOUNTER — Ambulatory Visit: Payer: Self-pay | Admitting: Adult Health

## 2019-05-31 ENCOUNTER — Encounter: Payer: Self-pay | Admitting: Nurse Practitioner

## 2019-05-31 ENCOUNTER — Other Ambulatory Visit: Payer: Self-pay

## 2019-05-31 ENCOUNTER — Ambulatory Visit (INDEPENDENT_AMBULATORY_CARE_PROVIDER_SITE_OTHER): Payer: Self-pay | Admitting: Nurse Practitioner

## 2019-05-31 DIAGNOSIS — B369 Superficial mycosis, unspecified: Secondary | ICD-10-CM

## 2019-05-31 MED ORDER — KETOCONAZOLE 2 % EX CREA: 1.0000 "application " | TOPICAL_CREAM | Freq: Two times a day (BID) | CUTANEOUS | 0 refills | Status: DC

## 2019-05-31 NOTE — Progress Notes (Signed)
   Subjective:    Patient ID: Sharon Rowland, female    DOB: 06/09/1986, 33 y.o.   MRN: [id number]  Rash  This is a new problem. The current episode started in the past 7 days. The problem has been gradually worsening since onset. The affected locations include the groin. The rash is characterized by itchiness (yeast like appearance ). Associated with: pt diagnosed with UTI at [facility]  Treatments tried: Vasoline. The treatment provided no relief.   Pt has been wearing incontinence briefs and pads due to recent UTI. Pt has loss of sensation in groin area. This is related to her neurologic condition, possible MS. Being followed by a specialist. Also waiting to see endocrinologist.   Virtual Visit via Video Note  I connected with Sharon Rowland on 05/31/19 at  2:40 PM EDT by a video enabled telemedicine application and verified that I am speaking with the correct person using two identifiers.  Location: Patient: home Provider: office   I discussed the limitations of evaluation and management by telemedicine and the availability of in person appointments. The patient expressed understanding and agreed to proceed.  History of Present Illness: Wearing adult diaper due to significant incontinence related to neuro condition. Has developed redness in the GU area over the past 2 weeks. Shiny red with satellite lesions. No itching or pain due to numbness in this area. No fever. No pelvic pain or discharge. Same sexual partner.     Observations/Objective: Today's visit was via telephone Physical exam was not possible for this visit Attempted virtual visit but difficulty with audio.   Assessment and Plan: Superficial fungal infection of skin  Meds ordered this encounter  Medications  . ketoconazole (NIZORAL) 2 % cream    Sig: Apply 1 application topically 2 (two) times daily. prn    Dispense:  60 g    Refill:  0    Order Specific Question:   Supervising Provider    Answer:   Sallee Lange A  [[id number]]     Follow Up Instructions: Apply ketoconazole cream with Desitin ointment. Keep area as clean and dry as possible. Call back by end of the week if no improvement.    I discussed the assessment and treatment plan with the patient. The patient was provided an opportunity to ask questions and all were answered. The patient agreed with the plan and demonstrated an understanding of the instructions.   The patient was advised to call back or seek an in-person evaluation if the symptoms worsen or if the condition fails to improve as anticipated.  I provided 10 minutes of non-face-to-face time during this encounter.      Review of Systems  Skin: Positive for rash.       Objective:   Physical Exam        Assessment & Plan:

## 2019-06-07 ENCOUNTER — Encounter: Payer: Self-pay | Admitting: *Deleted

## 2019-06-08 ENCOUNTER — Other Ambulatory Visit: Payer: Self-pay

## 2019-06-08 ENCOUNTER — Ambulatory Visit (INDEPENDENT_AMBULATORY_CARE_PROVIDER_SITE_OTHER): Payer: Self-pay | Admitting: Obstetrics & Gynecology

## 2019-06-08 ENCOUNTER — Encounter: Payer: Self-pay | Admitting: Obstetrics & Gynecology

## 2019-06-08 ENCOUNTER — Encounter: Payer: Self-pay | Admitting: Family Medicine

## 2019-06-08 VITALS — BP 109/66 | HR 78 | Ht 64.0 in | Wt 176.0 lb

## 2019-06-08 DIAGNOSIS — N390 Urinary tract infection, site not specified: Secondary | ICD-10-CM

## 2019-06-08 DIAGNOSIS — E063 Autoimmune thyroiditis: Secondary | ICD-10-CM

## 2019-06-08 DIAGNOSIS — G35 Multiple sclerosis: Secondary | ICD-10-CM

## 2019-06-08 DIAGNOSIS — N319 Neuromuscular dysfunction of bladder, unspecified: Secondary | ICD-10-CM

## 2019-06-08 MED ORDER — SULFAMETHOXAZOLE-TRIMETHOPRIM 800-160 MG PO TABS: 1.0000 | ORAL_TABLET | Freq: Every day | ORAL | 2 refills | Status: DC

## 2019-06-08 NOTE — Progress Notes (Signed)
Chief Complaint  Patient presents with  . bladder problems      33 y.o. [id number] Patient's last menstrual period was 10/23/2016 (approximate). The current method of family planning is status post hysterectomy.  Outpatient Encounter Medications as of 06/08/2019  Medication Sig Note  . albuterol (PROVENTIL HFA;VENTOLIN HFA) 108 (90 Base) MCG/ACT inhaler Inhale 2 puffs into the lungs every 6 (six) hours as needed for wheezing or shortness of breath.    . Catheters (SELF-CATH SYSTEM STRAIGHT TIP) KIT 1 Device by Does not apply route as needed.   Marland Kitchen ibuprofen (ADVIL) 200 MG tablet Take 200 mg by mouth daily.  05/16/2019: Patient states that she alternates between Ibuprofen and Naproxen daily-never taking together.  . ketoconazole (NIZORAL) 2 % cream Apply 1 application topically 2 (two) times daily. prn   . ketorolac (TORADOL) 10 MG tablet Take 10 mg by mouth daily as needed. migraine   . levothyroxine (SYNTHROID) 25 MCG tablet Take 25 mcg by mouth daily before breakfast.   . Multiple Vitamin (MULTIVITAMIN WITH MINERALS) TABS tablet Take 1 tablet by mouth daily.   . naproxen sodium (ALEVE) 220 MG tablet Take 220 mg by mouth daily.  05/16/2019: Patient states that she alternates between Ibuprofen and Naproxen daily-never taking together.  . ondansetron (ZOFRAN) 4 MG tablet Take by mouth every 8 (eight) hours as needed for nausea or vomiting.    Marland Kitchen oxybutynin (DITROPAN XL) 10 MG 24 hr tablet Take 1 tablet (10 mg total) by mouth at bedtime.   . vitamin B-12 (CYANOCOBALAMIN) 1000 MCG tablet Take 1,000 mcg by mouth daily.   . Vitamin D, Ergocalciferol, (DRISDOL) 1.25 MG (50000 UT) CAPS capsule Take 50,000 Units by mouth every Monday.   . modafinil (PROVIGIL) 100 MG tablet Take 100 mg by mouth daily.  05/16/2019: Has not started this medication yet-due to start next week  . propranolol (INDERAL) 10 MG tablet Take 10 mg by mouth 2 (two) times daily.   Marland Kitchen sulfamethoxazole-trimethoprim (BACTRIM DS)  800-160 MG tablet Take 1 tablet by mouth daily.    No facility-administered encounter medications on file as of 06/08/2019.     Subjective Pt has complex bladder issues related to probable MS She is actually improved over the last month or so since she has been being managed for Hashimoto's thyroidits, chronic She is also going to be following up with her MS team at [facility] in the future and I am sure this would include a full urological evlauation and management as well, this is needed For now she will do intermittent cath and we will be proactive in treating any suspicion of acute cystits  Past Medical History:  Diagnosis Date  . Allergic rhinitis   . Anemia   . Asthma    last used inhaler 1 month ago  . Dysphagia   . Dysrhythmia    SVT  . Female bladder prolapse   . GERD (gastroesophageal reflux disease)    with pregnancy only  . Pelvic pain in antepartum period in second trimester 01/17/2014  . Pregnant 08/01/2015  . PSVT (paroxysmal supraventricular tachycardia) ([place])    could not be confirmed with 2016 study  . Rectocele   . Urethral prolapse   . Uterine prolapse   . Vaginal Pap smear, abnormal     Past Surgical History:  Procedure Laterality Date  . ANTERIOR AND POSTERIOR VAGINAL REPAIR    . CYSTOCELE REPAIR N/A 09/17/2017   Procedure: ANTERIOR REPAIR (CYSTOCELE);  Surgeon: Tania Ade  H, MD;  Location: [facility];  Service: Gynecology;  Laterality: N/A;  . SUPRAVENTRICULAR TACHYCARDIA ABLATION  01/05/2015   unsuccessful  . SUPRAVENTRICULAR TACHYCARDIA ABLATION N/A 01/05/2015   Procedure: SUPRAVENTRICULAR TACHYCARDIA ABLATION;  Surgeon: Evans Lance, MD;  Location: [facility] CATH LAB;  Service: Cardiovascular;  Laterality: N/A;  . VAGINAL HYSTERECTOMY N/A 09/17/2017   Procedure: HYSTERECTOMY VAGINAL WITH POSSIBLE BILATERAL SALPINGECTOMY;  Surgeon: Florian Buff, MD;  Location: [facility];  Service: Gynecology;  Laterality: N/A;    OB History    Gravida  6   Para  6   Term  6    Preterm      AB      Living  6     SAB      TAB      Ectopic      Multiple  0   Live Births  6           Allergies  Allergen Reactions  . Epinephrine Other (See Comments)    Pt states she is "sensitive" to epinephrine.   . Latex Dermatitis    Social History   Socioeconomic History  . Marital status: Married    Spouse name: Not on file  . Number of children: 6  . Years of education: HS  . Highest education level: Not on file  Occupational History  . Occupation: Programme researcher, broadcasting/film/video  Social Needs  . Financial resource strain: Not on file  . Food insecurity    Worry: Not on file    Inability: Not on file  . Transportation needs    Medical: Not on file    Non-medical: Not on file  Tobacco Use  . Smoking status: Never Smoker  . Smokeless tobacco: Never Used  Substance and Sexual Activity  . Alcohol use: No  . Drug use: No  . Sexual activity: Yes    Birth control/protection: Surgical    Comment: hyst  Lifestyle  . Physical activity    Days per week: Not on file    Minutes per session: Not on file  . Stress: Not on file  Relationships  . Social Herbalist on phone: Not on file    Gets together: Not on file    Attends religious service: Not on file    Active member of club or organization: Not on file    Attends meetings of clubs or organizations: Not on file    Relationship status: Not on file  Other Topics Concern  . Not on file  Social History Narrative   Lives with husband   Right handed    Caffeine use: 1-2 diet cokes per day    Family History  Problem Relation Age of Onset  . Hypertension Mother   . Hyperlipidemia Mother   . Multiple sclerosis Mother   . COPD Mother   . Cancer Mother        melanoma x 2  . Diabetes Mother   . Heart disease Father        PSVT  . Hyperlipidemia Brother   . Hypertension Brother   . Heart disease Maternal Grandfather        heart attack  . Cancer Paternal Grandmother         pancreatic cancer  . Heart disease Paternal Grandmother        MI  . Hyperlipidemia Paternal Grandmother   . Hypertension Paternal Grandmother   . Asthma Daughter   . Asthma Son   .  Autism Son   . ADD / ADHD Son   . Tourette syndrome Son   . Other Son        Neurological and mental issues  . ADD / ADHD Son     Medications:       Current Outpatient Medications:  .  albuterol (PROVENTIL HFA;VENTOLIN HFA) 108 (90 Base) MCG/ACT inhaler, Inhale 2 puffs into the lungs every 6 (six) hours as needed for wheezing or shortness of breath. , Disp: , Rfl:  .  Catheters (SELF-CATH SYSTEM STRAIGHT TIP) KIT, 1 Device by Does not apply route as needed., Disp: 50 each, Rfl: 11 .  ibuprofen (ADVIL) 200 MG tablet, Take 200 mg by mouth daily. , Disp: , Rfl:  .  ketoconazole (NIZORAL) 2 % cream, Apply 1 application topically 2 (two) times daily. prn, Disp: 60 g, Rfl: 0 .  ketorolac (TORADOL) 10 MG tablet, Take 10 mg by mouth daily as needed. migraine, Disp: , Rfl:  .  levothyroxine (SYNTHROID) 25 MCG tablet, Take 25 mcg by mouth daily before breakfast., Disp: , Rfl:  .  Multiple Vitamin (MULTIVITAMIN WITH MINERALS) TABS tablet, Take 1 tablet by mouth daily., Disp: , Rfl:  .  naproxen sodium (ALEVE) 220 MG tablet, Take 220 mg by mouth daily. , Disp: , Rfl:  .  ondansetron (ZOFRAN) 4 MG tablet, Take by mouth every 8 (eight) hours as needed for nausea or vomiting. , Disp: , Rfl:  .  oxybutynin (DITROPAN XL) 10 MG 24 hr tablet, Take 1 tablet (10 mg total) by mouth at bedtime., Disp: 30 tablet, Rfl: 5 .  vitamin B-12 (CYANOCOBALAMIN) 1000 MCG tablet, Take 1,000 mcg by mouth daily., Disp: , Rfl:  .  Vitamin D, Ergocalciferol, (DRISDOL) 1.25 MG (50000 UT) CAPS capsule, Take 50,000 Units by mouth every Monday., Disp: , Rfl:  .  modafinil (PROVIGIL) 100 MG tablet, Take 100 mg by mouth daily. , Disp: , Rfl:  .  propranolol (INDERAL) 10 MG tablet, Take 10 mg by mouth 2 (two) times daily., Disp: , Rfl:  .   sulfamethoxazole-trimethoprim (BACTRIM DS) 800-160 MG tablet, Take 1 tablet by mouth daily., Disp: 30 tablet, Rfl: 2  Objective Blood pressure 109/66, pulse 78, height 5' 4"  (1.626 m), weight 176 lb (79.8 kg), last menstrual period 10/23/2016, not currently breastfeeding.  Gen WDWN NAD  Pertinent ROS Per HPI othereise negative  Labs or studies No new reviewed historical data    Impression Diagnoses this Encounter::   ICD-10-CM   1. Neurogenic bladder  N31.9   2. MS (multiple sclerosis) ([place])  G35   3. Hashimoto's thyroiditis  E06.3   4. Recurrent UTI  N39.0     Established relevant diagnosis(es):   Plan/Recommendations: Meds ordered this encounter  Medications  . sulfamethoxazole-trimethoprim (BACTRIM DS) 800-160 MG tablet    Sig: Take 1 tablet by mouth daily.    Dispense:  30 tablet    Refill:  2    Labs or Scans Ordered: No orders of the defined types were placed in this encounter.   Management:: >Bactrim DS for supression of recurrent UTI >recommend urological input as part of her mS team at [facility] for her  Complex issues, may include eventually need for a stimulator, IC is much less of a concern but should still be kept in mind given her autoimmune propensity  Follow up Return if symptoms worsen or fail to improve.        Face to face time:  15 minutes  Greater  than 50% of the visit time was spent in counseling and coordination of care with the patient.  The summary and outline of the counseling and care coordination is summarized in the note above.   All questions were answered.

## 2019-07-20 ENCOUNTER — Other Ambulatory Visit: Payer: Self-pay

## 2019-07-20 ENCOUNTER — Encounter (HOSPITAL_COMMUNITY): Payer: Self-pay | Admitting: Emergency Medicine

## 2019-07-20 ENCOUNTER — Emergency Department (HOSPITAL_COMMUNITY)
Admission: EM | Admit: 2019-07-20 | Discharge: 2019-07-21 | Disposition: A | Discharge status: Home / Self Care | Payer: Self-pay | Attending: Emergency Medicine | Admitting: Emergency Medicine

## 2019-07-20 ENCOUNTER — Emergency Department (HOSPITAL_COMMUNITY): Payer: Self-pay

## 2019-07-20 DIAGNOSIS — R0602 Shortness of breath: Secondary | ICD-10-CM | POA: Insufficient documentation

## 2019-07-20 DIAGNOSIS — Z79899 Other long term (current) drug therapy: Secondary | ICD-10-CM | POA: Insufficient documentation

## 2019-07-20 DIAGNOSIS — E039 Hypothyroidism, unspecified: Secondary | ICD-10-CM | POA: Insufficient documentation

## 2019-07-20 DIAGNOSIS — R1310 Dysphagia, unspecified: Secondary | ICD-10-CM | POA: Insufficient documentation

## 2019-07-20 DIAGNOSIS — J45909 Unspecified asthma, uncomplicated: Secondary | ICD-10-CM | POA: Insufficient documentation

## 2019-07-20 LAB — CBC
HCT: 43.4 % (ref 36.0–46.0)
Hemoglobin: 14.5 g/dL (ref 12.0–15.0)
MCH: 28 pg (ref 26.0–34.0)
MCHC: 33.4 g/dL (ref 30.0–36.0)
MCV: 83.8 fL (ref 80.0–100.0)
Platelets: 292 10*3/uL (ref 150–400)
RBC: 5.18 MIL/uL — ABNORMAL HIGH (ref 3.87–5.11)
RDW: 12.9 % (ref 11.5–15.5)
WBC: 10.9 10*3/uL — ABNORMAL HIGH (ref 4.0–10.5)
nRBC: 0 % (ref 0.0–0.2)

## 2019-07-20 LAB — BASIC METABOLIC PANEL
Anion gap: 11 (ref 5–15)
BUN: 5 mg/dL — ABNORMAL LOW (ref 6–20)
CO2: 23 mmol/L (ref 22–32)
Calcium: 10.3 mg/dL (ref 8.9–10.3)
Chloride: 102 mmol/L (ref 98–111)
Creatinine, Ser: 0.81 mg/dL (ref 0.44–1.00)
GFR calc Af Amer: >60 mL/min (ref >60)
GFR calc non Af Amer: >60 mL/min (ref >60)
Glucose, Bld: 108 mg/dL — ABNORMAL HIGH (ref 70–99)
Potassium: 3.2 mmol/L — ABNORMAL LOW (ref 3.5–5.1)
Sodium: 136 mmol/L (ref 135–145)

## 2019-07-20 LAB — TROPONIN I (HIGH SENSITIVITY): Troponin I (High Sensitivity): <2 ng/L (ref <18)

## 2019-07-20 LAB — I-STAT BETA HCG BLOOD, ED (MC, WL, AP ONLY): I-stat hCG, quantitative: <5 m[IU]/mL (ref <5)

## 2019-07-20 MED ORDER — SODIUM CHLORIDE 0.9% FLUSH: 3.0000 mL | Freq: Once | INTRAVENOUS | Status: DC

## 2019-07-20 NOTE — ED Triage Notes (Signed)
Pt report she has had trouble swallowing "for months", it has continued to get worse, "affecting my breathing where I feel SOB."  Pt states she can't eat or "even take by pills"  Pt is able to speak in complete sentences and is not in respiratory distress at the moment.

## 2019-07-21 ENCOUNTER — Emergency Department (HOSPITAL_COMMUNITY): Payer: Self-pay

## 2019-07-21 LAB — TROPONIN I (HIGH SENSITIVITY): Troponin I (High Sensitivity): <2 ng/L (ref <18)

## 2019-07-21 LAB — TSH: TSH: 1.893 u[IU]/mL (ref 0.350–4.500)

## 2019-07-21 LAB — T4, FREE: Free T4: 0.99 ng/dL (ref 0.61–1.12)

## 2019-07-21 MED ORDER — SODIUM CHLORIDE 0.9 % IV BOLUS
1000.0000 mL | Freq: Once | INTRAVENOUS | Status: AC
  Administered 2019-07-21: 1000 mL via INTRAVENOUS

## 2019-07-21 MED ORDER — FAMOTIDINE IN NACL 20-0.9 MG/50ML-% IV SOLN: 20.0000 mg | Freq: Once | INTRAVENOUS | Status: DC

## 2019-07-21 MED ORDER — IOHEXOL 300 MG/ML  SOLN
75.0000 mL | Freq: Once | INTRAMUSCULAR | Status: AC | PRN
  Administered 2019-07-21: 09:00:00 75 mL via INTRAVENOUS

## 2019-07-21 NOTE — ED Notes (Signed)
Pt returned from CT °

## 2019-07-21 NOTE — ED Provider Notes (Signed)
[facility] EMERGENCY DEPARTMENT Provider Note   CSN: [id number] Arrival date & time: 07/20/19  2109    History   Chief Complaint Chief Complaint  Patient presents with  . "trouble swallowing"  . Shortness of Breath    HPI Sharon Rowland is a 33 y.o. female.     Patient is a 33 year old female with a history of Hashimoto's, SVT, GERD and asthma who presents with difficulty swallowing.  She states that she has had some trouble swallowing over the last couple of months but over the last few days it is gotten worse and she feels like there is swelling in her throat.  She feels like her thyroid is compressing on her trachea making it difficult to swallow.  She says now she has to chew her food really small and and has a hard time going down.  She has some associated shortness of breath.  She has some pressure in her throat and in her upper chest.  No pleuritic symptoms.  No cough or cold symptoms.  No dizziness.  She has had some episodes of tachycardia as well.  She has had previous SVT although when she was diagnosed with Hashimoto's and was not on thyroid medicine, it seems to have improved but now she is having episodes of tachycardia again.     Past Medical History:  Diagnosis Date  . Allergic rhinitis   . Anemia   . Asthma    last used inhaler 1 month ago  . Dysphagia   . Dysrhythmia    SVT  . Female bladder prolapse   . GERD (gastroesophageal reflux disease)    with pregnancy only  . Pelvic pain in antepartum period in second trimester 01/17/2014  . Pregnant 08/01/2015  . PSVT (paroxysmal supraventricular tachycardia) ([place])    could not be confirmed with 2016 study  . Rectocele   . Urethral prolapse   . Uterine prolapse   . Vaginal Pap smear, abnormal     Patient Active Problem List   Diagnosis Date Noted  . Fall 03/09/2019  . Urinary retention 03/04/2019  . Numbness 03/04/2019  . Lhermitte's sign positive 03/04/2019  . Dysphagia 03/04/2019  .  Acute nasopharyngitis 11/25/2018  . Hematuria 11/25/2018  . Burning with urination 11/25/2018  . S/P vaginal hysterectomy 09/17/2017  . Normal labor 08/10/2017  . Uterine/bladder prolapse 02/13/2017  . History of recurrent UTI (urinary tract infection) 02/27/2016  . Migraines 10/03/2015  . Supervision of normal pregnancy 09/05/2015  . Trigger point of shoulder region 06/20/2015  . Rh negative state in antepartum period 01/31/2014  . Abnormal Pap smear of cervix 01/31/2014  . Paroxysmal SVT (supraventricular tachycardia) ([place]) 11/09/2013  . Asthma, stable 11/09/2013    Past Surgical History:  Procedure Laterality Date  . ANTERIOR AND POSTERIOR VAGINAL REPAIR    . CYSTOCELE REPAIR N/A 09/17/2017   Procedure: ANTERIOR REPAIR (CYSTOCELE);  Surgeon: Florian Buff, MD;  Location: [facility];  Service: Gynecology;  Laterality: N/A;  . SUPRAVENTRICULAR TACHYCARDIA ABLATION  01/05/2015   unsuccessful  . SUPRAVENTRICULAR TACHYCARDIA ABLATION N/A 01/05/2015   Procedure: SUPRAVENTRICULAR TACHYCARDIA ABLATION;  Surgeon: Evans Lance, MD;  Location: [facility] CATH LAB;  Service: Cardiovascular;  Laterality: N/A;  . VAGINAL HYSTERECTOMY N/A 09/17/2017   Procedure: HYSTERECTOMY VAGINAL WITH POSSIBLE BILATERAL SALPINGECTOMY;  Surgeon: Florian Buff, MD;  Location: [facility];  Service: Gynecology;  Laterality: N/A;     OB History    Gravida  6   Para  6  Term  6   Preterm      AB      Living  6     SAB      TAB      Ectopic      Multiple  0   Live Births  6            Home Medications    Prior to Admission medications   Medication Sig Start Date End Date Taking? Authorizing Provider  albuterol (PROVENTIL HFA;VENTOLIN HFA) 108 (90 Base) MCG/ACT inhaler Inhale 2 puffs into the lungs every 6 (six) hours as needed for wheezing or shortness of breath.    Yes [provider]  cholecalciferol (VITAMIN D3) 25 MCG (1000 UT) tablet Take 1,000 Units by mouth daily.   Yes [provider]  Fremanezumab-vfrm 225 MG/1.5ML SOAJ Inject 225 mg into the skin every 30 (thirty) days. 05/20/19  Yes [provider]  levothyroxine (SYNTHROID) 50 MCG tablet Take 50 mcg by mouth daily before breakfast.   Yes [provider]  Multiple Vitamin (MULTIVITAMIN WITH MINERALS) TABS tablet Take 1 tablet by mouth daily.   Yes [provider]  oxybutynin (DITROPAN XL) 10 MG 24 hr tablet Take 1 tablet (10 mg total) by mouth at bedtime. 05/18/19  Yes Kathyrn Drown, MD  phentermine (ADIPEX-P) 37.5 MG tablet Take 37.5 mg by mouth daily. 06/22/19  Yes [provider]  Catheters (SELF-CATH SYSTEM STRAIGHT TIP) KIT 1 Device by Does not apply route as needed. 9/44/96   Delora Fuel, MD  ketoconazole (NIZORAL) 2 % cream Apply 1 application topically 2 (two) times daily. prn Patient not taking: Reported on 07/21/2019 05/31/19   Nilda Simmer, NP  modafinil (PROVIGIL) 100 MG tablet Take 100 mg by mouth daily.  05/10/19 06/09/19  [provider]  sulfamethoxazole-trimethoprim (BACTRIM DS) 800-160 MG tablet Take 1 tablet by mouth daily. Patient not taking: Reported on 07/21/2019 06/08/19   Florian Buff, MD    Family History Family History  Problem Relation Age of Onset  . Hypertension Mother   . Hyperlipidemia Mother   . Multiple sclerosis Mother   . COPD Mother   . Cancer Mother        melanoma x 2  . Diabetes Mother   . Heart disease Father        PSVT  . Hyperlipidemia Brother   . Hypertension Brother   . Heart disease Maternal Grandfather        heart attack  . Cancer Paternal Grandmother        pancreatic cancer  . Heart disease Paternal Grandmother        MI  . Hyperlipidemia Paternal Grandmother   . Hypertension Paternal Grandmother   . Asthma Daughter   . Asthma Son   . Autism Son   . ADD / ADHD Son   . Tourette syndrome Son   . Other Son        Neurological and mental issues  . ADD / ADHD Son     Social History Social  History   Tobacco Use  . Smoking status: Never Smoker  . Smokeless tobacco: Never Used  Substance Use Topics  . Alcohol use: No  . Drug use: No     Allergies   Epinephrine and Latex   Review of Systems Review of Systems  Constitutional: Negative for chills, diaphoresis, fatigue and fever.  HENT: Positive for trouble swallowing. Negative for congestion, rhinorrhea and sneezing.   Eyes:  Negative.   Respiratory: Positive for chest tightness and shortness of breath. Negative for cough.   Cardiovascular: Positive for palpitations. Negative for chest pain and leg swelling.  Gastrointestinal: Negative for abdominal pain, blood in stool, diarrhea, nausea and vomiting.  Genitourinary: Negative for difficulty urinating, flank pain, frequency and hematuria.  Musculoskeletal: Negative for arthralgias and back pain.  Skin: Negative for rash.  Neurological: Negative for dizziness, speech difficulty, weakness, numbness and headaches.     Physical Exam Updated Vital Signs BP 118/70   Pulse 89   Temp 98.9 F (37.2 C) (Oral)   Resp 18   Ht 5' 4"  (1.626 m)   Wt 72.6 kg   LMP 10/23/2016 (Approximate)   SpO2 100%   BMI 27.46 kg/m   Physical Exam Constitutional:      Appearance: She is well-developed.  HENT:     Head: Normocephalic and atraumatic.  Eyes:     Pupils: Pupils are equal, round, and reactive to light.  Neck:     Musculoskeletal: Normal range of motion and neck supple.     Comments: No significant swelling on visual inspection.,  Oropharynx is clear without erythema or exudates Cardiovascular:     Rate and Rhythm: Regular rhythm. Tachycardia present.     Heart sounds: Normal heart sounds.  Pulmonary:     Effort: Pulmonary effort is normal. No respiratory distress.     Breath sounds: Normal breath sounds. No wheezing or rales.  Chest:     Chest wall: No tenderness.  Abdominal:     General: Bowel sounds are normal.     Palpations: Abdomen is soft.     Tenderness:  There is no abdominal tenderness. There is no guarding or rebound.  Musculoskeletal: Normal range of motion.     Comments: No edema or calf tenderness  Lymphadenopathy:     Cervical: No cervical adenopathy.  Skin:    General: Skin is warm and dry.     Findings: No rash.  Neurological:     Mental Status: She is alert and oriented to person, place, and time.      ED Treatments / Results  Labs (all labs ordered are listed, but only abnormal results are displayed) Labs Reviewed  BASIC METABOLIC PANEL - Abnormal; Notable for the following components:      Result Value   Potassium 3.2 (*)    Glucose, Bld 108 (*)    BUN 5 (*)    All other components within normal limits  CBC - Abnormal; Notable for the following components:   WBC 10.9 (*)    RBC 5.18 (*)    All other components within normal limits  T4, FREE  TSH  T3, FREE  T3  T4  I-STAT BETA HCG BLOOD, ED ([facility], [facility], [facility] ONLY)  TROPONIN I (HIGH SENSITIVITY)  TROPONIN I (HIGH SENSITIVITY)    EKG EKG Interpretation  Date/Time:  Wednesday July 21 2019 00:53:22 EDT Ventricular Rate:  139 PR Interval:  140 QRS Duration: 74 QT Interval:  360 QTC Calculation: 547 R Axis:   83 Text Interpretation:  Sinus tachycardia ST & T wave abnormality, consider inferior ischemia Abnormal ECG since last tracing no significant change Confirmed by Malvin Johns [phone]) on 07/21/2019 7:05:06 AM   Radiology Dg Chest 2 View  Result Date: 07/20/2019 CLINICAL DATA:  Shortness of breath, difficulty swallowing EXAM: CHEST - 2 VIEW COMPARISON:  None. FINDINGS: Lungs are clear.  No pleural effusion or pneumothorax. The heart is normal in size. Visualized osseous  structures are within normal limits. IMPRESSION: Normal chest radiographs. Electronically Signed   By: Julian Hy M.D.   On: 07/20/2019 21:56   Ct Soft Tissue Neck W Contrast  Result Date: 07/21/2019 CLINICAL DATA:  Palpable nodule or thyroid enlargement swelling, difficulty  swallowing. Patient having difficulty swallowing and breathing. History of Hashimoto's. EXAM: CT NECK WITH CONTRAST TECHNIQUE: Multidetector CT imaging of the neck was performed using the standard protocol following the bolus administration of intravenous contrast. CONTRAST:  47m OMNIPAQUE IOHEXOL 300 MG/ML  SOLN COMPARISON:  Cervical spine MRI 03/26/2019 FINDINGS: Pharynx and larynx: Normal. No mass or swelling. Salivary glands: No inflammation, mass, or stone. Thyroid: Diffusely enlarged and heterogeneously enhancing thyroid gland. No evidence of airway compromise. 9 mm nodule within the inferior right thyroid lobe (series 2, image 79). Lymph nodes: No pathologically enlarged cervical chain lymph nodes. Vascular: The carotid and vertebral arteries are patent within the neck. Limited intracranial: Unremarkable Visualized orbits: Unremarkable Mastoids and visualized paranasal sinuses: Well aerated Skeleton: No acute or aggressive process. Upper chest: Negative. IMPRESSION: Diffusely enlarged and heterogeneously enhancing thyroid gland consistent with the provided history of thyroiditis. No evidence of airway compromise. Incidental subcentimeter nodule within the right thyroid lobe, not meeting consensus criteria for ultrasound follow-up. Electronically Signed   By: KKellie Simmering  On: 07/21/2019 09:11    Procedures Procedures (including critical care time)  Medications Ordered in ED Medications  sodium chloride flush (NS) 0.9 % injection 3 mL (3 mLs Intravenous Not Given 07/21/19 0820)  famotidine (PEPCID) IVPB 20 mg premix (20 mg Intravenous Refused 07/21/19 0800)  sodium chloride 0.9 % bolus 1,000 mL (1,000 mLs Intravenous New Bag/Given 07/21/19 0806)  iohexol (OMNIPAQUE) 300 MG/ML solution 75 mL (75 mLs Intravenous Contrast Given 07/21/19 0834)     Initial Impression / Assessment and Plan / ED Course  I have reviewed the triage vital signs and the nursing notes.  Pertinent labs & imaging results  that were available during my care of the patient were reviewed by me and considered in my medical decision making (see chart for details).        Patient is a 33year old female who presents with difficulty swallowing.  She states she has trouble swallowing solids and feels like it gets stuck in her throat.  She feels like her thyroid gland is swollen.  A CT scan was performed which shows an enlarged thyroid gland consistent with her reported history of Hashimoto's.  There is no airway compression.  Her labs are non-concerning.  Her TSH and free T4 are within normal limits.  She was initially tachycardic however her heart rate has improved and on my most recent exam, her heart rate was in the mid 80s.  I feel like the next that may be having a GI consult to do a possible endoscopy to assess for possible strictures.  She does not report any significant reflux.  She does not have any airway compromise.  She is appropriate for discharge.  She was given an outpatient referral for gastroenterology.  Return precautions were given.  Final Clinical Impressions(s) / ED Diagnoses   Final diagnoses:  Dysphagia, unspecified type    ED Discharge Orders    None       BMalvin Johns MD 07/21/19 1049

## 2019-07-21 NOTE — ED Notes (Signed)
Pt c/o feeling worse - EKG repeated.

## 2019-07-22 LAB — T3: T3, Total: 130 ng/dL (ref 71–180)

## 2019-07-22 LAB — T4: T4, Total: 8.9 ug/dL (ref 4.5–12.0)

## 2019-07-22 LAB — T3, FREE: T3, Free: 3.6 pg/mL (ref 2.0–4.4)

## 2019-09-06 ENCOUNTER — Telehealth: Payer: Self-pay | Admitting: *Deleted

## 2019-09-06 NOTE — Telephone Encounter (Signed)
Called patient back to discuss her symptoms. Left voicemail to call back or send mychart message.

## 2019-09-06 NOTE — Telephone Encounter (Signed)
Having UTI symptoms, requesting meds.

## 2019-09-07 NOTE — Telephone Encounter (Signed)
Called patient back and left message that I am returning her call. If she is still needing assistance she can call back.

## 2019-09-17 DIAGNOSIS — Z820 Family history of epilepsy and other diseases of the nervous system: Secondary | ICD-10-CM

## 2019-09-17 DIAGNOSIS — R200 Anesthesia of skin: Secondary | ICD-10-CM

## 2019-09-17 DIAGNOSIS — R531 Weakness: Secondary | ICD-10-CM

## 2019-10-07 ENCOUNTER — Other Ambulatory Visit
Admission: RE | Admit: 2019-10-07 | Discharge: 2019-10-07 | Disposition: A | Discharge status: Home / Self Care | Payer: Self-pay | Source: Ambulatory Visit | Attending: Specialist | Admitting: Specialist

## 2019-10-07 DIAGNOSIS — R0781 Pleurodynia: Secondary | ICD-10-CM | POA: Insufficient documentation

## 2019-10-07 LAB — FIBRIN DERIVATIVES D-DIMER (ARMC ONLY): Fibrin derivatives D-dimer (ARMC): 150.72 ng/mL (FEU) (ref 0.00–499.00)

## 2019-10-27 ENCOUNTER — Emergency Department (HOSPITAL_COMMUNITY)
Admission: EM | Admit: 2019-10-27 | Discharge: 2019-10-27 | Disposition: A | Discharge status: Home / Self Care | Payer: Self-pay | Attending: Emergency Medicine | Admitting: Emergency Medicine

## 2019-10-27 ENCOUNTER — Encounter (HOSPITAL_COMMUNITY): Payer: Self-pay | Admitting: *Deleted

## 2019-10-27 ENCOUNTER — Other Ambulatory Visit: Payer: Self-pay

## 2019-10-27 DIAGNOSIS — G510 Bell's palsy: Secondary | ICD-10-CM | POA: Insufficient documentation

## 2019-10-27 DIAGNOSIS — Z79899 Other long term (current) drug therapy: Secondary | ICD-10-CM | POA: Insufficient documentation

## 2019-10-27 DIAGNOSIS — Z91040 Latex allergy status: Secondary | ICD-10-CM | POA: Insufficient documentation

## 2019-10-27 DIAGNOSIS — J45909 Unspecified asthma, uncomplicated: Secondary | ICD-10-CM | POA: Insufficient documentation

## 2019-10-27 LAB — BASIC METABOLIC PANEL
Anion gap: 8 (ref 5–15)
BUN: 9 mg/dL (ref 6–20)
CO2: 27 mmol/L (ref 22–32)
Calcium: 9.9 mg/dL (ref 8.9–10.3)
Chloride: 101 mmol/L (ref 98–111)
Creatinine, Ser: 0.64 mg/dL (ref 0.44–1.00)
GFR calc Af Amer: >60 mL/min (ref >60)
GFR calc non Af Amer: >60 mL/min (ref >60)
Glucose, Bld: 106 mg/dL — ABNORMAL HIGH (ref 70–99)
Potassium: 3.5 mmol/L (ref 3.5–5.1)
Sodium: 136 mmol/L (ref 135–145)

## 2019-10-27 LAB — URINALYSIS, ROUTINE W REFLEX MICROSCOPIC
Bilirubin Urine: NEGATIVE
Glucose, UA: NEGATIVE mg/dL
Ketones, ur: NEGATIVE mg/dL
Leukocytes,Ua: NEGATIVE
Nitrite: NEGATIVE
Protein, ur: NEGATIVE mg/dL
Specific Gravity, Urine: 1.01 (ref 1.005–1.030)
pH: 6 (ref 5.0–8.0)

## 2019-10-27 LAB — CBC
HCT: 44.5 % (ref 36.0–46.0)
Hemoglobin: 14.4 g/dL (ref 12.0–15.0)
MCH: 28.2 pg (ref 26.0–34.0)
MCHC: 32.4 g/dL (ref 30.0–36.0)
MCV: 87.3 fL (ref 80.0–100.0)
Platelets: 320 10*3/uL (ref 150–400)
RBC: 5.1 MIL/uL (ref 3.87–5.11)
RDW: 12.9 % (ref 11.5–15.5)
WBC: 14.7 10*3/uL — ABNORMAL HIGH (ref 4.0–10.5)
nRBC: 0 % (ref 0.0–0.2)

## 2019-10-27 LAB — CBG MONITORING, ED: Glucose-Capillary: 98 mg/dL (ref 70–99)

## 2019-10-27 LAB — PREGNANCY, URINE: Preg Test, Ur: NEGATIVE

## 2019-10-27 MED ORDER — PREDNISONE 20 MG PO TABS: 60.0000 mg | ORAL_TABLET | Freq: Every day | ORAL | 0 refills | Status: DC

## 2019-10-27 MED ORDER — VALACYCLOVIR HCL 1 G PO TABS: 1000.0000 mg | ORAL_TABLET | Freq: Three times a day (TID) | ORAL | 0 refills | Status: DC

## 2019-10-27 MED ORDER — PREDNISONE 50 MG PO TABS
60.0000 mg | ORAL_TABLET | Freq: Once | ORAL | Status: AC
  Administered 2019-10-27: 60 mg via ORAL
  Filled 2019-10-27: qty 1

## 2019-10-27 NOTE — ED Notes (Signed)
EDP Steinl made aware.

## 2019-10-27 NOTE — ED Provider Notes (Signed)
[facility] EMERGENCY DEPARTMENT Provider Note   CSN: [id number] Arrival date & time: 10/27/19  1759     History   Chief Complaint Chief Complaint  Patient presents with  . Code Stroke    HPI Sharon Rowland is a 33 y.o. female.     Patient c/o right side facial weakness onset today. Symptoms acute onset, mild, constant, persistent. Denies hx same. No hx bells palsy. Notes neurology workup in past year for intermittent left sided numbness - workup including mri neg for cva, spine mris also neg, and imaging/lp for MS also neg.  Patient denies extremity numbness or weakness. No change in speech or vision. No change in balance, coordination or gait. No headache. No ear pain or lesions/rash. No hearing loss. No head or facial injury or contusion. No eye pain or redness.   The history is provided by the patient.    Past Medical History:  Diagnosis Date  . Allergic rhinitis   . Anemia   . Asthma    last used inhaler 1 month ago  . Dysphagia   . Dysrhythmia    SVT  . Female bladder prolapse   . GERD (gastroesophageal reflux disease)    with pregnancy only  . Pelvic pain in antepartum period in second trimester 01/17/2014  . Pregnant 08/01/2015  . PSVT (paroxysmal supraventricular tachycardia) ([place])    could not be confirmed with 2016 study  . Rectocele   . Urethral prolapse   . Uterine prolapse   . Vaginal Pap smear, abnormal     Patient Active Problem List   Diagnosis Date Noted  . Fall 03/09/2019  . Urinary retention 03/04/2019  . Numbness 03/04/2019  . Lhermitte's sign positive 03/04/2019  . Dysphagia 03/04/2019  . Acute nasopharyngitis 11/25/2018  . Hematuria 11/25/2018  . Burning with urination 11/25/2018  . S/P vaginal hysterectomy 09/17/2017  . Normal labor 08/10/2017  . Uterine/bladder prolapse 02/13/2017  . History of recurrent UTI (urinary tract infection) 02/27/2016  . Migraines 10/03/2015  . Supervision of normal pregnancy 09/05/2015  . Trigger point  of shoulder region 06/20/2015  . Rh negative state in antepartum period 01/31/2014  . Abnormal Pap smear of cervix 01/31/2014  . Paroxysmal SVT (supraventricular tachycardia) ([place]) 11/09/2013  . Asthma, stable 11/09/2013    Past Surgical History:  Procedure Laterality Date  . ANTERIOR AND POSTERIOR VAGINAL REPAIR    . CYSTOCELE REPAIR N/A 09/17/2017   Procedure: ANTERIOR REPAIR (CYSTOCELE);  Surgeon: Florian Buff, MD;  Location: [facility];  Service: Gynecology;  Laterality: N/A;  . SUPRAVENTRICULAR TACHYCARDIA ABLATION  01/05/2015   unsuccessful  . SUPRAVENTRICULAR TACHYCARDIA ABLATION N/A 01/05/2015   Procedure: SUPRAVENTRICULAR TACHYCARDIA ABLATION;  Surgeon: Evans Lance, MD;  Location[facility] C[facility] LAB;  Service: Cardiovascular;  Laterality: N/A;  . VAGINAL HYSTERECTOMY N/A 09/17/2017   Procedure: HYSTERECTOMY VAGINAL WITH POSSIBLE BILATERAL SALPINGECTOMY;  Surgeon: Florian Buff, MD;  Location: [facility];  Service: Gynecology;  Laterality: N/A;     OB History    Gravida  6   Para  6   Term  6   Preterm      AB      Living  6     SAB      TAB      Ectopic      Multiple  0   Live Births  6            Home Medications    Prior to Admission medications   Medication  Sig Start Date End Date Taking? Authorizing Provider  albuterol (PROVENTIL HFA;VENTOLIN HFA) 108 (90 Base) MCG/ACT inhaler Inhale 2 puffs into the lungs every 6 (six) hours as needed for wheezing or shortness of breath.     [provider]  Catheters (SELF-CATH SYSTEM STRAIGHT TIP) KIT 1 Device by Does not apply route as needed. 6/37/85   Delora Fuel, MD  cholecalciferol (VITAMIN D3) 25 MCG (1000 UT) tablet Take 1,000 Units by mouth daily.    [provider]  Fremanezumab-vfrm 225 MG/1.5ML SOAJ Inject 225 mg into the skin every 30 (thirty) days. 05/20/19   [provider]  ketoconazole (NIZORAL) 2 % cream Apply 1 application topically 2 (two) times daily. prn Patient not taking:  Reported on 07/21/2019 05/31/19   Nilda Simmer, NP  levothyroxine (SYNTHROID) 50 MCG tablet Take 50 mcg by mouth daily before breakfast.    [provider]  modafinil (PROVIGIL) 100 MG tablet Take 100 mg by mouth daily.  05/10/19 06/09/19  [provider]  Multiple Vitamin (MULTIVITAMIN WITH MINERALS) TABS tablet Take 1 tablet by mouth daily.    [provider]  oxybutynin (DITROPAN XL) 10 MG 24 hr tablet Take 1 tablet (10 mg total) by mouth at bedtime. 05/18/19   Kathyrn Drown, MD  phentermine (ADIPEX-P) 37.5 MG tablet Take 37.5 mg by mouth daily. 06/22/19   [provider]  sulfamethoxazole-trimethoprim (BACTRIM DS) 800-160 MG tablet Take 1 tablet by mouth daily. Patient not taking: Reported on 07/21/2019 06/08/19   Florian Buff, MD    Family History Family History  Problem Relation Age of Onset  . Hypertension Mother   . Hyperlipidemia Mother   . Multiple sclerosis Mother   . COPD Mother   . Cancer Mother        melanoma x 2  . Diabetes Mother   . Heart disease Father        PSVT  . Hyperlipidemia Brother   . Hypertension Brother   . Heart disease Maternal Grandfather        heart attack  . Cancer Paternal Grandmother        pancreatic cancer  . Heart disease Paternal Grandmother        MI  . Hyperlipidemia Paternal Grandmother   . Hypertension Paternal Grandmother   . Asthma Daughter   . Asthma Son   . Autism Son   . ADD / ADHD Son   . Tourette syndrome Son   . Other Son        Neurological and mental issues  . ADD / ADHD Son     Social History Social History   Tobacco Use  . Smoking status: Never Smoker  . Smokeless tobacco: Never Used  Substance Use Topics  . Alcohol use: No  . Drug use: No     Allergies   Epinephrine and Latex   Review of Systems Review of Systems  Constitutional: Negative for chills and fever.  HENT: Negative for ear pain, hearing loss and sore throat.   Eyes: Negative for pain and redness.   Respiratory: Negative for cough and shortness of breath.   Cardiovascular: Negative for chest pain.  Gastrointestinal: Negative for abdominal pain and vomiting.  Genitourinary: Negative for flank pain.  Musculoskeletal: Negative for back pain and neck pain.  Skin: Negative for rash.  Neurological: Negative for headaches.  Hematological: Does not bruise/bleed easily.  Psychiatric/Behavioral: Negative for confusion.     Physical Exam Updated Vital Signs BP (!) 149/103 (  BP Location: Right Arm)   Pulse (!) 106   Temp 98.4 F (36.9 C) (Oral)   Resp 14   LMP 10/23/2016 (Approximate)   SpO2 96%   Physical Exam Vitals signs and nursing note reviewed.  Constitutional:      Appearance: Normal appearance. She is well-developed.  HENT:     Head: Atraumatic.     Right Ear: Tympanic membrane, ear canal and external ear normal.     Nose: Nose normal.     Mouth/Throat:     Mouth: Mucous membranes are moist.  Eyes:     General: No scleral icterus.    Extraocular Movements: Extraocular movements intact.     Conjunctiva/sclera: Conjunctivae normal.     Pupils: Pupils are equal, round, and reactive to light.  Neck:     Musculoskeletal: Normal range of motion and neck supple. No neck rigidity or muscular tenderness.     Vascular: No carotid bruit.     Trachea: No tracheal deviation.  Cardiovascular:     Rate and Rhythm: Normal rate and regular rhythm.     Pulses: Normal pulses.     Heart sounds: Normal heart sounds. No murmur. No friction rub. No gallop.   Pulmonary:     Effort: Pulmonary effort is normal. No respiratory distress.     Breath sounds: Normal breath sounds.  Abdominal:     General: Bowel sounds are normal. There is no distension.     Palpations: Abdomen is soft.     Tenderness: There is no abdominal tenderness. There is no guarding.  Genitourinary:    Comments: No cva tenderness.  Musculoskeletal:        General: No swelling.  Skin:    General: Skin is warm and dry.      Findings: No rash.  Neurological:     Mental Status: She is alert.     Comments: Alert, speech normal/fluent. Right facial weakness including forehead. Is able to close lids. No pronator drift. Motor intact bil, stre 5/5. Steady gait. sens grossly intact.   Psychiatric:        Mood and Affect: Mood normal.      ED Treatments / Results  Labs (all labs ordered are listed, but only abnormal results are displayed) Results for orders placed or performed during the hospital encounter of 10/27/19  BMET  Result Value Ref Range   Sodium 136 135 - 145 mmol/L   Potassium 3.5 3.5 - 5.1 mmol/L   Chloride 101 98 - 111 mmol/L   CO2 27 22 - 32 mmol/L   Glucose, Bld 106 (H) 70 - 99 mg/dL   BUN 9 6 - 20 mg/dL   Creatinine, Ser 0.64 0.44 - 1.00 mg/dL   Calcium 9.9 8.9 - 10.3 mg/dL   GFR calc non Af Amer >60 >60 mL/min   GFR calc Af Amer >60 >60 mL/min   Anion gap 8 5 - 15  CBC  Result Value Ref Range   WBC 14.7 (H) 4.0 - 10.5 K/uL   RBC 5.10 3.87 - 5.11 MIL/uL   Hemoglobin 14.4 12.0 - 15.0 g/dL   HCT 44.5 36.0 - 46.0 %   MCV 87.3 80.0 - 100.0 fL   MCH 28.2 26.0 - 34.0 pg   MCHC 32.4 30.0 - 36.0 g/dL   RDW 12.9 11.5 - 15.5 %   Platelets 320 150 - 400 K/uL   nRBC 0.0 0.0 - 0.2 %  UA  Result Value Ref Range   Color, Urine YELLOW  YELLOW   APPearance CLEAR CLEAR   Specific Gravity, Urine 1.010 1.005 - 1.030   pH 6.0 5.0 - 8.0   Glucose, UA NEGATIVE NEGATIVE mg/dL   Hgb urine dipstick SMALL (A) NEGATIVE   Bilirubin Urine NEGATIVE NEGATIVE   Ketones, ur NEGATIVE NEGATIVE mg/dL   Protein, ur NEGATIVE NEGATIVE mg/dL   Nitrite NEGATIVE NEGATIVE   Leukocytes,Ua NEGATIVE NEGATIVE   RBC / HPF 0-5 0 - 5 RBC/hpf   WBC, UA 0-5 0 - 5 WBC/hpf   Bacteria, UA FEW (A) NONE SEEN   Squamous Epithelial / LPF 0-5 0 - 5   Mucus PRESENT   Urine pregnancy  Result Value Ref Range   Preg Test, Ur NEGATIVE NEGATIVE  CBG monitoring, ED  Result Value Ref Range   Glucose-Capillary 98 70 - 99 mg/dL     EKG EKG Interpretation  Date/Time:  Wednesday October 27 2019 18:13:56 EST Ventricular Rate:  92 PR Interval:  140 QRS Duration: 76 QT Interval:  314 QTC Calculation: 388 R Axis:   83 Text Interpretation: Normal sinus rhythm with sinus arrhythmia Nonspecific T wave abnormality Confirmed by Lajean Saver [phone]) on 10/27/2019 6:20:44 PM   Radiology No results found.  Procedures Procedures (including critical care time)  Medications Ordered in ED Medications - No data to display   Initial Impression / Assessment and Plan / ED Course  I have reviewed the triage vital signs and the nursing notes.  Pertinent labs & imaging results that were available during my care of the patient were reviewed by me and considered in my medical decision making (see chart for details).  Labs sent by staff.   Reviewed nursing notes and prior charts for additional history.  Workup in past several months reviewed - LP/MRIs neg for acute process then.   Labs reviewed by me - chem normal. No uti.   Exam is c/w Bells palsy.  Will provide rx for home.   Prednisone po.   rx pred/valtrex for home. Lacrilube.   Return precautions provided.     Final Clinical Impressions(s) / ED Diagnoses   Final diagnoses:  None    ED Discharge Orders    None       Lajean Saver, MD 10/27/19 2117

## 2019-10-27 NOTE — ED Triage Notes (Signed)
Pt in c/o R sided eye drooping today @ 17:00, pt reports R visual changes yesterday evening, pt has small amt of R facial droop presently, pt denies slurred speech, pt hx of connective tissue disorder and autoimmune disease, pt A&O x4, potential MS diagnosis, Charge RN aware, pt to be seen by EDP prior to code stroke being called per [employer]

## 2019-10-27 NOTE — Discharge Instructions (Signed)
It was our pleasure to provide your ER care today - we hope that you feel better.  Your exam appears consistent with Bell's Palsy - see information attached.   Take prednisone and valtrex as prescribed.   To avoid eye irritation/injury - use moisturizing eye drops such as lacrilube or natural tears. If eyelid will not close, you may gently tape lid closed while sleeping to help avoid dryness or scratch/injury of eye.   Follow up with primary care doctor in 1 week. Also have your blood pressure rechecked then as it is high today.   Return to ER if worse, new symptoms, fevers, new numbness/weakness, severe eye pain, new or severe pain, or other concern.

## 2019-10-29 ENCOUNTER — Ambulatory Visit: Admit: 2019-10-29 | Discharge: 2019-10-30

## 2020-02-14 DIAGNOSIS — Z029 Encounter for administrative examinations, unspecified: Secondary | ICD-10-CM

## 2020-02-21 DIAGNOSIS — Z029 Encounter for administrative examinations, unspecified: Secondary | ICD-10-CM

## 2020-03-11 ENCOUNTER — Emergency Department (HOSPITAL_COMMUNITY)
Admission: EM | Admit: 2020-03-11 | Discharge: 2020-03-11 | Disposition: A | Discharge status: Home / Self Care | Payer: BLUE CROSS/BLUE SHIELD | Attending: Emergency Medicine | Admitting: Emergency Medicine

## 2020-03-11 ENCOUNTER — Emergency Department (HOSPITAL_COMMUNITY): Payer: BLUE CROSS/BLUE SHIELD

## 2020-03-11 ENCOUNTER — Encounter (HOSPITAL_COMMUNITY): Payer: Self-pay

## 2020-03-11 ENCOUNTER — Other Ambulatory Visit: Payer: Self-pay

## 2020-03-11 DIAGNOSIS — R109 Unspecified abdominal pain: Secondary | ICD-10-CM | POA: Diagnosis present

## 2020-03-11 DIAGNOSIS — R531 Weakness: Secondary | ICD-10-CM | POA: Insufficient documentation

## 2020-03-11 DIAGNOSIS — R112 Nausea with vomiting, unspecified: Secondary | ICD-10-CM | POA: Diagnosis not present

## 2020-03-11 DIAGNOSIS — Z91040 Latex allergy status: Secondary | ICD-10-CM | POA: Diagnosis not present

## 2020-03-11 DIAGNOSIS — Z79899 Other long term (current) drug therapy: Secondary | ICD-10-CM | POA: Diagnosis not present

## 2020-03-11 DIAGNOSIS — R1084 Generalized abdominal pain: Secondary | ICD-10-CM

## 2020-03-11 LAB — COMPREHENSIVE METABOLIC PANEL
ALT: 28 U/L (ref 0–44)
AST: 15 U/L (ref 15–41)
Albumin: 4.8 g/dL (ref 3.5–5.0)
Alkaline Phosphatase: 54 U/L (ref 38–126)
Anion gap: 12 (ref 5–15)
BUN: 18 mg/dL (ref 6–20)
CO2: 24 mmol/L (ref 22–32)
Calcium: 9.3 mg/dL (ref 8.9–10.3)
Chloride: 102 mmol/L (ref 98–111)
Creatinine, Ser: 0.76 mg/dL (ref 0.44–1.00)
GFR calc Af Amer: >60 mL/min (ref >60)
GFR calc non Af Amer: >60 mL/min (ref >60)
Glucose, Bld: 101 mg/dL — ABNORMAL HIGH (ref 70–99)
Potassium: 3.6 mmol/L (ref 3.5–5.1)
Sodium: 138 mmol/L (ref 135–145)
Total Bilirubin: 0.8 mg/dL (ref 0.3–1.2)
Total Protein: 7.8 g/dL (ref 6.5–8.1)

## 2020-03-11 LAB — I-STAT BETA HCG BLOOD, ED (MC, WL, AP ONLY): I-stat hCG, quantitative: <5 m[IU]/mL (ref <5)

## 2020-03-11 LAB — CBC WITH DIFFERENTIAL/PLATELET
Abs Immature Granulocytes: 0.08 10*3/uL — ABNORMAL HIGH (ref 0.00–0.07)
Basophils Absolute: 0.1 10*3/uL (ref 0.0–0.1)
Basophils Relative: 0 %
Eosinophils Absolute: 0.2 10*3/uL (ref 0.0–0.5)
Eosinophils Relative: 1 %
HCT: 47.1 % — ABNORMAL HIGH (ref 36.0–46.0)
Hemoglobin: 15.6 g/dL — ABNORMAL HIGH (ref 12.0–15.0)
Immature Granulocytes: 0 %
Lymphocytes Relative: 8 %
Lymphs Abs: 1.5 10*3/uL (ref 0.7–4.0)
MCH: 29.6 pg (ref 26.0–34.0)
MCHC: 33.1 g/dL (ref 30.0–36.0)
MCV: 89.4 fL (ref 80.0–100.0)
Monocytes Absolute: 1.3 10*3/uL — ABNORMAL HIGH (ref 0.1–1.0)
Monocytes Relative: 7 %
Neutro Abs: 15.3 10*3/uL — ABNORMAL HIGH (ref 1.7–7.7)
Neutrophils Relative %: 84 %
Platelets: 265 10*3/uL (ref 150–400)
RBC: 5.27 MIL/uL — ABNORMAL HIGH (ref 3.87–5.11)
RDW: 12.7 % (ref 11.5–15.5)
WBC: 18.4 10*3/uL — ABNORMAL HIGH (ref 4.0–10.5)
nRBC: 0 % (ref 0.0–0.2)

## 2020-03-11 LAB — URINALYSIS, ROUTINE W REFLEX MICROSCOPIC
Bilirubin Urine: NEGATIVE
Glucose, UA: NEGATIVE mg/dL
Hgb urine dipstick: NEGATIVE
Ketones, ur: NEGATIVE mg/dL
Leukocytes,Ua: NEGATIVE
Nitrite: NEGATIVE
Protein, ur: NEGATIVE mg/dL
Specific Gravity, Urine: >1.046 — ABNORMAL HIGH (ref 1.005–1.030)
pH: 6 (ref 5.0–8.0)

## 2020-03-11 LAB — TROPONIN I (HIGH SENSITIVITY)
Troponin I (High Sensitivity): <2 ng/L (ref <18)
Troponin I (High Sensitivity): 2 ng/L (ref <18)

## 2020-03-11 LAB — LIPASE, BLOOD: Lipase: 31 U/L (ref 11–51)

## 2020-03-11 MED ORDER — METOCLOPRAMIDE HCL 5 MG/ML IJ SOLN
10.0000 mg | Freq: Once | INTRAMUSCULAR | Status: AC
  Administered 2020-03-11: 15:00:00 10 mg via INTRAVENOUS
  Filled 2020-03-11: qty 2

## 2020-03-11 MED ORDER — METHYLPREDNISOLONE SODIUM SUCC 125 MG IJ SOLR
125.0000 mg | Freq: Once | INTRAMUSCULAR | Status: AC
  Administered 2020-03-11: 125 mg via INTRAVENOUS
  Filled 2020-03-11: qty 2

## 2020-03-11 MED ORDER — KETOROLAC TROMETHAMINE 30 MG/ML IJ SOLN
15.0000 mg | Freq: Once | INTRAMUSCULAR | Status: AC
  Administered 2020-03-11: 14:00:00 15 mg via INTRAVENOUS
  Filled 2020-03-11: qty 1

## 2020-03-11 MED ORDER — ONDANSETRON 4 MG PO TBDP: 4.0000 mg | ORAL_TABLET | Freq: Three times a day (TID) | ORAL | 0 refills | Status: DC | PRN

## 2020-03-11 MED ORDER — ONDANSETRON HCL 4 MG/2ML IJ SOLN
4.0000 mg | Freq: Once | INTRAMUSCULAR | Status: AC
  Administered 2020-03-11: 13:00:00 4 mg via INTRAVENOUS
  Filled 2020-03-11: qty 2

## 2020-03-11 MED ORDER — IOHEXOL 300 MG/ML  SOLN
100.0000 mL | Freq: Once | INTRAMUSCULAR | Status: AC | PRN
  Administered 2020-03-11: 100 mL via INTRAVENOUS

## 2020-03-11 MED ORDER — SODIUM CHLORIDE 0.9 % IV BOLUS
1000.0000 mL | Freq: Once | INTRAVENOUS | Status: AC
  Administered 2020-03-11: 13:00:00 1000 mL via INTRAVENOUS

## 2020-03-11 MED ORDER — FENTANYL CITRATE (PF) 100 MCG/2ML IJ SOLN
25.0000 ug | Freq: Once | INTRAMUSCULAR | Status: AC
  Administered 2020-03-11: 15:00:00 25 ug via INTRAVENOUS
  Filled 2020-03-11: qty 2

## 2020-03-11 NOTE — ED Provider Notes (Signed)
[facility] EMERGENCY DEPARTMENT Provider Note   CSN: [id number] Arrival date & time: 03/11/20  1119     History Chief Complaint  Patient presents with  . Abdominal Pain    Sharon Rowland is a 34 y.o. female.  HPI      Sharon Rowland is a 34 y.o. female with a past medical history significant for anemia, asthma, SVT, pots disease and adrenal insufficiency.  She presents to the Emergency Department complaining of generalized weakness, abdominal pain, and vomiting.  She also endorses some chest tightness today after onset of the vomiting.  Symptoms began during the early hours of this morning.  She reports multiple episodes of vomiting and unable to keep down any fluids.  She states that she took her first Covid vaccine 3 days ago.  She is unsure if her symptoms are relative to the vaccine.  She was advised to withhold her methotrexate for 1 week.  And states that she has been unable to keep down her prednisone or NSAID medication. She feels this has exacerbated her current symptoms.  She denies diarrhea, fever, chills, dysuria and shortness of breath.      Past Medical History:  Diagnosis Date  . Allergic rhinitis   . Anemia   . Asthma    last used inhaler 1 month ago  . Dysphagia   . Dysrhythmia    SVT  . Female bladder prolapse   . GERD (gastroesophageal reflux disease)    with pregnancy only  . Pelvic pain in antepartum period in second trimester 01/17/2014  . Pregnant 08/01/2015  . PSVT (paroxysmal supraventricular tachycardia) (HCC)    could not be confirmed with 2016 study  . Rectocele   . Urethral prolapse   . Uterine prolapse   . Vaginal Pap smear, abnormal     Patient Active Problem List   Diagnosis Date Noted  . Fall 03/09/2019  . Urinary retention 03/04/2019  . Numbness 03/04/2019  . Lhermitte's sign positive 03/04/2019  . Dysphagia 03/04/2019  . Acute nasopharyngitis 11/25/2018  . Hematuria 11/25/2018  . Burning with urination 11/25/2018  . S/P  vaginal hysterectomy 09/17/2017  . Normal labor 08/10/2017  . Uterine/bladder prolapse 02/13/2017  . History of recurrent UTI (urinary tract infection) 02/27/2016  . Migraines 10/03/2015  . Supervision of normal pregnancy 09/05/2015  . Trigger point of shoulder region 06/20/2015  . Rh negative state in antepartum period 01/31/2014  . Abnormal Pap smear of cervix 01/31/2014  . Paroxysmal SVT (supraventricular tachycardia) (HCC) 11/09/2013  . Asthma, stable 11/09/2013    Past Surgical History:  Procedure Laterality Date  . ANTERIOR AND POSTERIOR VAGINAL REPAIR    . CYSTOCELE REPAIR N/A 09/17/2017   Procedure: ANTERIOR REPAIR (CYSTOCELE);  Surgeon: Lazaro Arms, MD;  Location: [facility];  Service: Gynecology;  Laterality: N/A;  . SUPRAVENTRICULAR TACHYCARDIA ABLATION  01/05/2015   unsuccessful  . SUPRAVENTRICULAR TACHYCARDIA ABLATION N/A 01/05/2015   Procedure: SUPRAVENTRICULAR TACHYCARDIA ABLATION;  Surgeon: Marinus Maw, MD;  Location: [facility] CATH LAB;  Service: Cardiovascular;  Laterality: N/A;  . VAGINAL HYSTERECTOMY N/A 09/17/2017   Procedure: HYSTERECTOMY VAGINAL WITH POSSIBLE BILATERAL SALPINGECTOMY;  Surgeon: Lazaro Arms, MD;  Location: [facility];  Service: Gynecology;  Laterality: N/A;     OB History    Gravida  6   Para  6   Term  6   Preterm      AB      Living  6     SAB  TAB      Ectopic      Multiple  0   Live Births  6           Family History  Problem Relation Age of Onset  . Hypertension Mother   . Hyperlipidemia Mother   . Multiple sclerosis Mother   . COPD Mother   . Cancer Mother        melanoma x 2  . Diabetes Mother   . Heart disease Father        PSVT  . Hyperlipidemia Brother   . Hypertension Brother   . Heart disease Maternal Grandfather        heart attack  . Cancer Paternal Grandmother        pancreatic cancer  . Heart disease Paternal Grandmother        MI  . Hyperlipidemia Paternal Grandmother   . Hypertension Paternal  Grandmother   . Asthma Daughter   . Asthma Son   . Autism Son   . ADD / ADHD Son   . Tourette syndrome Son   . Other Son        Neurological and mental issues  . ADD / ADHD Son     Social History   Tobacco Use  . Smoking status: Never Smoker  . Smokeless tobacco: Never Used  Substance Use Topics  . Alcohol use: No  . Drug use: No    Home Medications Prior to Admission medications   Medication Sig Start Date End Date Taking? Authorizing Provider  albuterol (PROVENTIL HFA;VENTOLIN HFA) 108 (90 Base) MCG/ACT inhaler Inhale 2 puffs into the lungs every 6 (six) hours as needed for wheezing or shortness of breath.    Yes [provider]  Calcium-Vitamin D-Vitamin K (VIACTIV CALCIUM PLUS D) 650-12.5-40 MG-MCG-MCG CHEW Chew 1 each by mouth daily.   Yes [provider]  DHEA 50 MG TABS Take 50 mg by mouth daily. 03/02/20  Yes [provider]  fludrocortisone (FLORINEF) 0.1 MG tablet Take 100 mcg by mouth daily. 11/25/19  Yes [provider]  folic acid (FOLVITE) 1 MG tablet Take 1 mg by mouth daily. 03/09/20  Yes [provider]  hydroxychloroquine (PLAQUENIL) 200 MG tablet Take 400 mg by mouth daily. 02/03/20 05/03/20 Yes [provider]  ibuprofen (ADVIL) 200 MG tablet Take 800 mg by mouth every 6 (six) hours as needed for mild pain or moderate pain.   Yes [provider]  levothyroxine (SYNTHROID) 75 MCG tablet Take 75 mcg by mouth daily before breakfast.    Yes [provider]  methenamine (HIPREX) 1 g tablet Take 1 tablet by mouth in the morning and at bedtime. 03/03/20 04/02/20 Yes [provider]  methotrexate (RHEUMATREX) 2.5 MG tablet Take 15 mg by mouth once a week. 03/01/20 05/31/20 Yes [provider]  Multiple Vitamin (MULTIVITAMIN WITH MINERALS) TABS tablet Take 1 tablet by mouth daily.   Yes [provider]  naproxen sodium (ALEVE) 220 MG tablet Take 440 mg by mouth 2 (two) times daily as  needed.   Yes [provider]  omeprazole (PRILOSEC) 40 MG capsule Take 40 mg by mouth daily.   Yes [provider]  OnabotulinumtoxinA (BOTOX IJ) Inject 1 Dose as directed every 3 (three) months.   Yes [provider]  ondansetron (ZOFRAN) 4 MG tablet Take 4 mg by mouth 3 (three) times daily as needed. 10/14/19  Yes [provider]  predniSONE (DELTASONE) 10 MG tablet Take 10 mg  by mouth daily. 10/18/19  Yes [provider]  Naltrexone-buPROPion HCl ER (CONTRAVE) 8-90 MG TB12 Take 2 tablets by mouth in the morning and at bedtime. 03/07/20   [provider]    Allergies    Epinephrine and Latex  Review of Systems   Review of Systems  Constitutional: Negative for appetite change, chills and fever.  HENT: Negative for sore throat and trouble swallowing.   Respiratory: Positive for chest tightness. Negative for shortness of breath and wheezing.   Cardiovascular: Positive for chest pain. Negative for leg swelling.  Gastrointestinal: Positive for abdominal pain, nausea and vomiting. Negative for blood in stool and diarrhea.  Genitourinary: Negative for decreased urine volume, difficulty urinating, dysuria and flank pain.  Musculoskeletal: Negative for back pain and myalgias.  Skin: Negative for color change and rash.  Neurological: Negative for dizziness, weakness and numbness.  Hematological: Negative for adenopathy.    Physical Exam Updated Vital Signs BP 121/83   Pulse (!) 106   Temp 98 F (36.7 C) (Oral)   Resp 16   LMP 10/23/2016 (Approximate)   SpO2 100%   Physical Exam Vitals and nursing note reviewed.  Constitutional:      Appearance: She is well-developed. She is not ill-appearing or toxic-appearing.  HENT:     Mouth/Throat:     Mouth: Mucous membranes are dry.  Cardiovascular:     Rate and Rhythm: Normal rate and regular rhythm.     Pulses: Normal pulses.  Pulmonary:     Effort: Pulmonary effort is normal.      Breath sounds: Normal breath sounds.  Chest:     Chest wall: No tenderness.  Abdominal:     Palpations: Abdomen is soft.     Tenderness: There is abdominal tenderness.  Skin:    General: Skin is warm.     Findings: No rash.  Neurological:     General: No focal deficit present.     Mental Status: She is alert.     Sensory: No sensory deficit.     Motor: No weakness.     ED Results / Procedures / Treatments   Labs (all labs ordered are listed, but only abnormal results are displayed) Labs Reviewed  COMPREHENSIVE METABOLIC PANEL - Abnormal; Notable for the following components:      Result Value   Glucose, Bld 101 (*)    All other components within normal limits  CBC WITH DIFFERENTIAL/PLATELET - Abnormal; Notable for the following components:   WBC 18.4 (*)    RBC 5.27 (*)    Hemoglobin 15.6 (*)    HCT 47.1 (*)    Neutro Abs 15.3 (*)    Monocytes Absolute 1.3 (*)    Abs Immature Granulocytes 0.08 (*)    All other components within normal limits  LIPASE, BLOOD  URINALYSIS, ROUTINE W REFLEX MICROSCOPIC  I-STAT BETA HCG BLOOD, ED ([facility], [facility], [facility] ONLY)  TROPONIN I (HIGH SENSITIVITY)  TROPONIN I (HIGH SENSITIVITY)    EKG EKG Interpretation  Date/Time:  Saturday March 11 2020 11:44:23 EDT Ventricular Rate:  144 PR Interval:    QRS Duration: 94 QT Interval:  284 QTC Calculation: 440 R Axis:   76 Text Interpretation: Sinus tachycardia LAE, consider biatrial enlargement Borderline repolarization abnormality Artifact Confirmed by Vanetta Mulders [phone]) on 03/11/2020 11:46:55 AM   Radiology CT ABDOMEN PELVIS W CONTRAST  Result Date: 03/11/2020 CLINICAL DATA:  Nausea, vomiting. EXAM: CT ABDOMEN AND PELVIS WITH CONTRAST TECHNIQUE: Multidetector CT imaging of the abdomen and pelvis  was performed using the standard protocol following bolus administration of intravenous contrast. CONTRAST:  19mL OMNIPAQUE IOHEXOL 300 MG/ML  SOLN COMPARISON:  None. FINDINGS: Lower chest: No acute  abnormality. Hepatobiliary: No focal liver abnormality is seen. No gallstones, gallbladder wall thickening, or biliary dilatation. Pancreas: Unremarkable. No pancreatic ductal dilatation or surrounding inflammatory changes. Spleen: Normal in size without focal abnormality. Adrenals/Urinary Tract: Adrenal glands are unremarkable. Kidneys are normal, without renal calculi, focal lesion, or hydronephrosis. Bladder is unremarkable. Stomach/Bowel: Stomach is within normal limits. Appendix appears normal. No evidence of bowel wall thickening, distention, or inflammatory changes. Vascular/Lymphatic: No significant vascular findings are present. No enlarged abdominal or pelvic lymph nodes. Reproductive: Status post hysterectomy. No adnexal masses. Other: No abdominal wall hernia or abnormality. No abdominopelvic ascites. Musculoskeletal: No acute or significant osseous findings. IMPRESSION: No acute abnormality seen in the abdomen or pelvis. Electronically Signed   By: Marijo Conception M.D.   On: 03/11/2020 15:41   DG Chest Portable 1 View  Result Date: 03/11/2020 CLINICAL DATA:  Chest pain. Woke up at 4:30 a.m. with abdominal pain and vomiting. History of pints disease. Adrenal insufficiency and lupus. EXAM: PORTABLE CHEST 1 VIEW COMPARISON:  07/20/2019 FINDINGS: The heart size and mediastinal contours are within normal limits. Both lungs are clear. The visualized skeletal structures are unremarkable. IMPRESSION: No active disease. Electronically Signed   By: Nolon Nations M.D.   On: 03/11/2020 13:11    Procedures Procedures (including critical care time)  Medications Ordered in ED Medications  sodium chloride 0.9 % bolus 1,000 mL (0 mLs Intravenous Stopped 03/11/20 1339)  ondansetron (ZOFRAN) injection 4 mg (4 mg Intravenous Given 03/11/20 1256)  methylPREDNISolone sodium succinate (SOLU-MEDROL) 125 mg/2 mL injection 125 mg (125 mg Intravenous Given 03/11/20 1343)  ketorolac (TORADOL) 30 MG/ML injection 15  mg (15 mg Intravenous Given 03/11/20 1352)  fentaNYL (SUBLIMAZE) injection 25 mcg (25 mcg Intravenous Given 03/11/20 1510)  metoCLOPramide (REGLAN) injection 10 mg (10 mg Intravenous Given 03/11/20 1510)  iohexol (OMNIPAQUE) 300 MG/ML solution 100 mL (100 mLs Intravenous Contrast Given 03/11/20 1517)    ED Course  I have reviewed the triage vital signs and the nursing notes.  Pertinent labs & imaging results that were available during my care of the patient were reviewed by me and considered in my medical decision making (see chart for details).    MDM Rules/Calculators/A&P                      Pt with hx of adrenal insufficiency, abd pain and vomiting.  No peritoneal signs.  Tachycardia on arrival, improved after IVF's. Chest tightness also improved.  EKG shows sinus tachycardia and delta troponin is unchanged.    Electrolytes reassuring and CT abd/pelvis is wnml.    On recheck, pt reports feeling better and vitals improved.  She is now tolerating oral fluids.  leukocytosis likely from steroids, no concerning sx's for sepsis. Current sx's likely from repeated vomiting. She appears appropriate for d/c home, and agrees to close out pt f/u.  Return precautions discussed.       Final Clinical Impression(s) / ED Diagnoses Final diagnoses:  Generalized abdominal pain  Non-intractable vomiting with nausea, unspecified vomiting type    Rx / DC Orders ED Discharge Orders    None       Kem Parkinson, PA-C 03/11/20 2143    Fredia Sorrow, MD 03/13/20 [phone]

## 2020-03-11 NOTE — ED Triage Notes (Signed)
Pt reports that she woke up approx 430 am with abdominal pain and started vomited . Reports approx 20 times. Initially food now bile. Pt has Potts disease, adrenal insuff and lupus and pt has been unable to take steroids.  Covid shot on wednesday

## 2020-03-11 NOTE — Discharge Instructions (Addendum)
Clear fluids as tolerated this evening.  Bland diet as tolerated starting tomorrow.  Follow-up with your primary doctor for recheck, return emergency department for any worsening symptoms.

## 2020-04-10 ENCOUNTER — Institutional Professional Consult (permissible substitution): Admit: 2020-04-10 | Discharge: 2020-04-11 | Payer: PRIVATE HEALTH INSURANCE

## 2020-04-10 DIAGNOSIS — N62 Hypertrophy of breast: Principal | ICD-10-CM

## 2020-04-14 DIAGNOSIS — N62 Hypertrophy of breast: Principal | ICD-10-CM

## 2020-04-28 ENCOUNTER — Emergency Department (HOSPITAL_COMMUNITY): Payer: BLUE CROSS/BLUE SHIELD

## 2020-04-28 ENCOUNTER — Emergency Department (HOSPITAL_COMMUNITY)
Admission: EM | Admit: 2020-04-28 | Discharge: 2020-04-29 | Disposition: A | Discharge status: Home / Self Care | Payer: BLUE CROSS/BLUE SHIELD | Attending: Emergency Medicine | Admitting: Emergency Medicine

## 2020-04-28 ENCOUNTER — Other Ambulatory Visit: Payer: Self-pay

## 2020-04-28 ENCOUNTER — Encounter (HOSPITAL_COMMUNITY): Payer: Self-pay | Admitting: *Deleted

## 2020-04-28 DIAGNOSIS — M3210 Systemic lupus erythematosus, organ or system involvement unspecified: Secondary | ICD-10-CM | POA: Diagnosis not present

## 2020-04-28 DIAGNOSIS — Z79899 Other long term (current) drug therapy: Secondary | ICD-10-CM | POA: Insufficient documentation

## 2020-04-28 DIAGNOSIS — R1013 Epigastric pain: Secondary | ICD-10-CM | POA: Diagnosis present

## 2020-04-28 DIAGNOSIS — Z8711 Personal history of peptic ulcer disease: Secondary | ICD-10-CM | POA: Diagnosis not present

## 2020-04-28 DIAGNOSIS — R110 Nausea: Secondary | ICD-10-CM | POA: Diagnosis not present

## 2020-04-28 DIAGNOSIS — K2900 Acute gastritis without bleeding: Secondary | ICD-10-CM | POA: Diagnosis not present

## 2020-04-28 DIAGNOSIS — K219 Gastro-esophageal reflux disease without esophagitis: Secondary | ICD-10-CM | POA: Diagnosis not present

## 2020-04-28 HISTORY — DX: Disorder of thyroid, unspecified: E07.9

## 2020-04-28 HISTORY — DX: Reserved for concepts with insufficient information to code with codable children: IMO0002

## 2020-04-28 HISTORY — DX: Systemic lupus erythematosus, unspecified: M32.9

## 2020-04-28 HISTORY — DX: Migraine, unspecified, not intractable, without status migrainosus: G43.909

## 2020-04-28 LAB — URINALYSIS, ROUTINE W REFLEX MICROSCOPIC
Bilirubin Urine: NEGATIVE
Glucose, UA: NEGATIVE mg/dL
Hgb urine dipstick: NEGATIVE
Ketones, ur: NEGATIVE mg/dL
Leukocytes,Ua: NEGATIVE
Nitrite: NEGATIVE
Protein, ur: NEGATIVE mg/dL
Specific Gravity, Urine: 1.014 (ref 1.005–1.030)
pH: 6 (ref 5.0–8.0)

## 2020-04-28 LAB — COMPREHENSIVE METABOLIC PANEL
ALT: 20 U/L (ref 0–44)
AST: 13 U/L — ABNORMAL LOW (ref 15–41)
Albumin: 4.3 g/dL (ref 3.5–5.0)
Alkaline Phosphatase: 44 U/L (ref 38–126)
Anion gap: 8 (ref 5–15)
BUN: 6 mg/dL (ref 6–20)
CO2: 25 mmol/L (ref 22–32)
Calcium: 8.9 mg/dL (ref 8.9–10.3)
Chloride: 105 mmol/L (ref 98–111)
Creatinine, Ser: 0.62 mg/dL (ref 0.44–1.00)
GFR calc Af Amer: >60 mL/min (ref >60)
GFR calc non Af Amer: >60 mL/min (ref >60)
Glucose, Bld: 97 mg/dL (ref 70–99)
Potassium: 3.5 mmol/L (ref 3.5–5.1)
Sodium: 138 mmol/L (ref 135–145)
Total Bilirubin: 0.4 mg/dL (ref 0.3–1.2)
Total Protein: 6.7 g/dL (ref 6.5–8.1)

## 2020-04-28 LAB — CBC
HCT: 37.5 % (ref 36.0–46.0)
Hemoglobin: 12.2 g/dL (ref 12.0–15.0)
MCH: 29.3 pg (ref 26.0–34.0)
MCHC: 32.5 g/dL (ref 30.0–36.0)
MCV: 89.9 fL (ref 80.0–100.0)
Platelets: 249 10*3/uL (ref 150–400)
RBC: 4.17 MIL/uL (ref 3.87–5.11)
RDW: 13.7 % (ref 11.5–15.5)
WBC: 8.1 10*3/uL (ref 4.0–10.5)
nRBC: 0 % (ref 0.0–0.2)

## 2020-04-28 LAB — LIPASE, BLOOD: Lipase: 31 U/L (ref 11–51)

## 2020-04-28 MED ORDER — SUCRALFATE 1 G PO TABS: 1.0000 g | ORAL_TABLET | Freq: Three times a day (TID) | ORAL | 0 refills | Status: DC

## 2020-04-28 MED ORDER — SODIUM CHLORIDE 0.9 % IV BOLUS
1000.0000 mL | Freq: Once | INTRAVENOUS | Status: AC
  Administered 2020-04-28: 21:00:00 1000 mL via INTRAVENOUS

## 2020-04-28 MED ORDER — PANTOPRAZOLE SODIUM 40 MG PO TBEC: 40.0000 mg | DELAYED_RELEASE_TABLET | Freq: Every day | ORAL | 0 refills | Status: DC

## 2020-04-28 MED ORDER — IOHEXOL 300 MG/ML  SOLN
100.0000 mL | Freq: Once | INTRAMUSCULAR | Status: AC | PRN
  Administered 2020-04-28: 100 mL via INTRAVENOUS

## 2020-04-28 MED ORDER — ONDANSETRON HCL 4 MG/2ML IJ SOLN
4.0000 mg | Freq: Once | INTRAMUSCULAR | Status: AC
  Administered 2020-04-28: 4 mg via INTRAVENOUS
  Filled 2020-04-28: qty 2

## 2020-04-28 MED ORDER — MORPHINE SULFATE (PF) 2 MG/ML IV SOLN
2.0000 mg | Freq: Once | INTRAVENOUS | Status: AC
  Administered 2020-04-28: 2 mg via INTRAVENOUS
  Filled 2020-04-28: qty 1

## 2020-04-28 MED ORDER — ALUM & MAG HYDROXIDE-SIMETH 200-200-20 MG/5ML PO SUSP
30.0000 mL | Freq: Once | ORAL | Status: AC
  Administered 2020-04-28: 23:00:00 30 mL via ORAL
  Filled 2020-04-28: qty 30

## 2020-04-28 NOTE — ED Provider Notes (Signed)
[facility] EMERGENCY DEPARTMENT Provider Note   CSN: [id number] Arrival date & time: 04/28/20  1837     History Chief Complaint  Patient presents with  . Abdominal Pain    Sharon Rowland is a 34 y.o. female with a history as outlined below, significant for lupus, GERD with history of peptic ulcer disease, pots syndrome and thyroid disease reporting epigastric pain that started several days ago in association with nausea without emesis.  Her pain is worse with eating, and slightly affected with fluid intake she describes a gnawing sensation similar to prior history of peptic ulcer disease which radiates into her back.  She has been on increased NSAIDs recently for treatment of her lupus symptoms, although she is recently been started on methotrexate and is in the process of weaning off of prednisone.  She does take omeprazole 40 mg daily.  She denies vomiting, her nausea is chronic and not worsened with the onset of this pain.  She denies shortness of breath, chest pain, fevers or chills, no abdominal distention, no dysuria or bowel changes.  The history is provided by the patient.       Past Medical History:  Diagnosis Date  . Allergic rhinitis   . Anemia   . Asthma    last used inhaler 1 month ago  . Dysphagia   . Dysrhythmia    SVT  . Female bladder prolapse   . GERD (gastroesophageal reflux disease)    with pregnancy only  . Lupus ([place])   . Migraines   . Pelvic pain in antepartum period in second trimester 01/17/2014  . Pregnant 08/01/2015  . PSVT (paroxysmal supraventricular tachycardia) ([place])    could not be confirmed with 2016 study  . Rectocele   . Thyroid disease   . Urethral prolapse   . Uterine prolapse   . Vaginal Pap smear, abnormal     Patient Active Problem List   Diagnosis Date Noted  . Fall 03/09/2019  . Urinary retention 03/04/2019  . Numbness 03/04/2019  . Lhermitte's sign positive 03/04/2019  . Dysphagia 03/04/2019  . Acute nasopharyngitis  11/25/2018  . Hematuria 11/25/2018  . Burning with urination 11/25/2018  . S/P vaginal hysterectomy 09/17/2017  . Normal labor 08/10/2017  . Uterine/bladder prolapse 02/13/2017  . History of recurrent UTI (urinary tract infection) 02/27/2016  . Migraines 10/03/2015  . Supervision of normal pregnancy 09/05/2015  . Trigger point of shoulder region 06/20/2015  . Rh negative state in antepartum period 01/31/2014  . Abnormal Pap smear of cervix 01/31/2014  . Paroxysmal SVT (supraventricular tachycardia) ([place]) 11/09/2013  . Asthma, stable 11/09/2013    Past Surgical History:  Procedure Laterality Date  . ANTERIOR AND POSTERIOR VAGINAL REPAIR    . CYSTOCELE REPAIR N/A 09/17/2017   Procedure: ANTERIOR REPAIR (CYSTOCELE);  Surgeon: Florian Buff, MD;  Location: [facility];  Service: Gynecology;  Laterality: N/A;  . SUPRAVENTRICULAR TACHYCARDIA ABLATION  01/05/2015   unsuccessful  . SUPRAVENTRICULAR TACHYCARDIA ABLATION N/A 01/05/2015   Procedure: SUPRAVENTRICULAR TACHYCARDIA ABLATION;  Surgeon: Evans Lance, MD;  Location: [facility] CATH LAB;  Service: Cardiovascular;  Laterality: N/A;  . VAGINAL HYSTERECTOMY N/A 09/17/2017   Procedure: HYSTERECTOMY VAGINAL WITH POSSIBLE BILATERAL SALPINGECTOMY;  Surgeon: Florian Buff, MD;  Location: [facility];  Service: Gynecology;  Laterality: N/A;     OB History    Gravida  6   Para  6   Term  6   Preterm      AB  Living  6     SAB      TAB      Ectopic      Multiple  0   Live Births  6           Family History  Problem Relation Age of Onset  . Hypertension Mother   . Hyperlipidemia Mother   . Multiple sclerosis Mother   . COPD Mother   . Cancer Mother        melanoma x 2  . Diabetes Mother   . Heart disease Father        PSVT  . Hyperlipidemia Brother   . Hypertension Brother   . Heart disease Maternal Grandfather        heart attack  . Cancer Paternal Grandmother        pancreatic cancer  . Heart disease Paternal  Grandmother        MI  . Hyperlipidemia Paternal Grandmother   . Hypertension Paternal Grandmother   . Asthma Daughter   . Asthma Son   . Autism Son   . ADD / ADHD Son   . Tourette syndrome Son   . Other Son        Neurological and mental issues  . ADD / ADHD Son     Social History   Tobacco Use  . Smoking status: Never Smoker  . Smokeless tobacco: Never Used  Substance Use Topics  . Alcohol use: No  . Drug use: No    Home Medications Prior to Admission medications   Medication Sig Start Date End Date Taking? Authorizing Provider  albuterol (PROVENTIL HFA;VENTOLIN HFA) 108 (90 Base) MCG/ACT inhaler Inhale 2 puffs into the lungs every 6 (six) hours as needed for wheezing or shortness of breath.    Yes [provider]  Calcium-Vitamin D-Vitamin K (VIACTIV CALCIUM PLUS D) 650-12.5-40 MG-MCG-MCG CHEW Chew 1 each by mouth daily.   Yes [provider]  clobetasol cream (TEMOVATE) 0.05 % Apply 1 application topically 2 (two) times daily. 04/28/20  Yes [provider]  clobetasol ointment (TEMOVATE) 0.05 % Apply 1 application topically 2 (two) times daily. 04/24/20 04/24/21 Yes [provider]  desonide (DESOWEN) 0.05 % ointment Apply 1 application topically 2 (two) times daily. 04/24/20  Yes [provider]  fludrocortisone (FLORINEF) 0.1 MG tablet Take 100 mcg by mouth daily. 11/25/19  Yes [provider]  folic acid (FOLVITE) 1 MG tablet Take 1 mg by mouth daily. 03/09/20  Yes [provider]  hydroxychloroquine (PLAQUENIL) 200 MG tablet Take 400 mg by mouth daily. 02/03/20 05/03/20 Yes [provider]  ibuprofen (ADVIL) 200 MG tablet Take 800 mg by mouth every 6 (six) hours as needed for mild pain or moderate pain.   Yes [provider]  levothyroxine (SYNTHROID) 75 MCG tablet Take 75 mcg by mouth daily before breakfast.    Yes [provider]  methotrexate (RHEUMATREX) 2.5 MG tablet Take 15 mg by mouth  once a week. 03/01/20 05/31/20 Yes [provider]  metoCLOPramide (REGLAN) 5 MG tablet Take 5 mg by mouth 2 (two) times daily. 04/12/20  Yes [provider]  Multiple Vitamin (MULTIVITAMIN WITH MINERALS) TABS tablet Take 1 tablet by mouth daily.   Yes [provider]  naproxen sodium (ALEVE) 220 MG tablet Take 440 mg by mouth 2 (two) times daily as needed.   Yes [provider]  omeprazole (PRILOSEC) 40 MG capsule Take 40 mg by mouth daily.   Yes  [provider]  OnabotulinumtoxinA (BOTOX IJ) Inject 1 Dose as directed every 3 (three) months.   Yes [provider]  ondansetron (ZOFRAN ODT) 4 MG disintegrating tablet Take 1 tablet (4 mg total) by mouth every 8 (eight) hours as needed for nausea or vomiting. 03/11/20  Yes Triplett, Tammy, PA-C  ondansetron (ZOFRAN) 4 MG tablet Take 4 mg by mouth 3 (three) times daily as needed. 10/14/19  Yes [provider]  predniSONE (DELTASONE) 2.5 MG tablet Take 10 mg by mouth daily.  10/18/19  Yes [provider]  Rimegepant Sulfate 75 MG TBDP Take 1 tablet by mouth every other day. 04/12/20  Yes [provider]    Allergies    Epinephrine and Latex  Review of Systems   Review of Systems  Constitutional: Negative for chills and fever.  HENT: Negative for congestion and sore throat.   Eyes: Negative.   Respiratory: Negative for chest tightness and shortness of breath.   Cardiovascular: Negative for chest pain.  Gastrointestinal: Positive for abdominal pain and nausea. Negative for blood in stool, constipation, diarrhea and vomiting.  Genitourinary: Negative.   Musculoskeletal: Negative for arthralgias, joint swelling and neck pain.  Skin: Negative.  Negative for rash and wound.  Neurological: Negative for dizziness, weakness, light-headedness, numbness and headaches.  Psychiatric/Behavioral: Negative.     Physical Exam Updated Vital Signs BP 113/76 (BP Location: Right Arm)    Pulse 70   Temp 98.3 F (36.8 C) (Oral)   Resp 20   Ht 5\' 3"  (1.6 m)   Wt 81.6 kg   LMP 10/23/2016 (Approximate)   SpO2 100%   BMI 31.89 kg/m   Physical Exam Vitals and nursing note reviewed.  Constitutional:      Appearance: She is well-developed.  HENT:     Head: Normocephalic and atraumatic.  Eyes:     Conjunctiva/sclera: Conjunctivae normal.  Cardiovascular:     Rate and Rhythm: Normal rate and regular rhythm.     Heart sounds: Normal heart sounds.  Pulmonary:     Effort: Pulmonary effort is normal.     Breath sounds: Normal breath sounds. No wheezing.  Abdominal:     General: Bowel sounds are normal.     Palpations: Abdomen is soft.     Tenderness: There is abdominal tenderness in the epigastric area. There is no guarding or rebound. Negative signs include Murphy's sign.  Musculoskeletal:        General: Normal range of motion.     Cervical back: Normal range of motion.  Skin:    General: Skin is warm and dry.  Neurological:     General: No focal deficit present.     Mental Status: She is alert.     ED Results / Procedures / Treatments   Labs (all labs ordered are listed, but only abnormal results are displayed) Labs Reviewed  COMPREHENSIVE METABOLIC PANEL - Abnormal; Notable for the following components:      Result Value   AST 13 (*)    All other components within normal limits  URINALYSIS, ROUTINE W REFLEX MICROSCOPIC - Abnormal; Notable for the following components:   Color, Urine STRAW (*)    APPearance HAZY (*)    All other components within normal limits  LIPASE, BLOOD  CBC    EKG None  Radiology CT ABDOMEN PELVIS W CONTRAST  Result Date: 04/28/2020 CLINICAL DATA:  Epigastric pain. Pain radiates to the back. Pain began 3 days ago. EXAM: CT ABDOMEN AND PELVIS WITH CONTRAST  TECHNIQUE: Multidetector CT imaging of the abdomen and pelvis was performed using the standard protocol following bolus administration of intravenous contrast. CONTRAST:   OMNIPAQUE IOHEXOL 300 MG/ML  SOLN COMPARISON:  CT, 03/11/2020 FINDINGS: Lower chest: Clear lung bases.  Heart normal in size. Hepatobiliary: No focal liver abnormality is seen. No gallstones, gallbladder wall thickening, or biliary dilatation. Pancreas: Unremarkable. No pancreatic ductal dilatation or surrounding inflammatory changes. Spleen: Normal in size without focal abnormality. Adrenals/Urinary Tract: Adrenal glands are unremarkable. Kidneys are normal, without renal calculi, focal lesion, or hydronephrosis. Bladder is unremarkable. Stomach/Bowel: Normal stomach. Small bowel and colon are normal in caliber. No wall thickening. No inflammation. Mild increased colonic stool burden. Normal appendix visualized. Vascular/Lymphatic: No significant vascular findings are present. No enlarged abdominal or pelvic lymph nodes. Reproductive: Status post hysterectomy. Residual ovaries shows cysts but are otherwise unremarkable. Other: No abdominal wall hernia or abnormality. No abdominopelvic ascites. Musculoskeletal: No acute or significant osseous findings. IMPRESSION: 1. No acute findings within the abdomen or pelvis. 2. Mild generalized increased colonic stool burden. No bowel inflammation. 3. Status post hysterectomy.  No other abnormalities. Electronically Signed   By: Amie Portland M.D.   On: 04/28/2020 22:01    Procedures Procedures (including critical care time)  Medications Ordered in ED Medications  sodium chloride 0.9 % bolus 1,000 mL (1,000 mLs Intravenous New Bag/Given 04/28/20 2112)  morphine 2 MG/ML injection 2 mg (2 mg Intravenous Given 04/28/20 2113)  ondansetron (ZOFRAN) injection 4 mg (4 mg Intravenous Given 04/28/20 2113)  iohexol (OMNIPAQUE) 300 MG/ML solution 100 mL (100 mLs Intravenous Contrast Given 04/28/20 2142)  alum & mag hydroxide-simeth (MAALOX/MYLANTA) 200-200-20 MG/5ML suspension 30 mL (30 mLs Oral Given 04/28/20 2238)    ED Course  I have reviewed the triage vital signs and the  nursing notes.  Pertinent labs & imaging results that were available during my care of the patient were reviewed by me and considered in my medical decision making (see chart for details).    MDM Rules/Calculators/A&P                      Labs and imaging reviewed and discussed with patient.  She was given a small dose of morphine here along with some Zofran and her symptoms were fairly well controlled on that, it has been several hours since she ate as she notes that pain is triggered by meals.  Currently 3 out of 10.  She was given dose of Maalox and had complete resolution of her pain symptoms.  She was asked to hold her omeprazole, and place we will try Protonix 40 mg daily instead.  Decrease NSAID use.  Added Carafate, advised not to take within 4 hours of taking her levothyroid.  Patient had normal LFTs and lipase, CT scan negative for any acute obvious structural abnormalities with her gallbladder, no gallstones, however possible gallbladder dysfunction, patient may benefit from a HIDA scan.  Asked her to discuss this with her PCP in order if felt appropriate.  She endorses that all of her specialists are at [facility] and has a GI specialist referral, her appointment is early July.   Final Clinical Impression(s) / ED Diagnoses Final diagnoses:  Acute gastritis without hemorrhage, unspecified gastritis type    Rx / DC Orders ED Discharge Orders    None       Victoriano Lain 04/29/20 Bobbe Medico, MD 04/29/20 [phone]

## 2020-04-28 NOTE — ED Triage Notes (Signed)
Pt c/o epigastric pain that radiates to back area, started a few days ago, reports nausea that she has been able to manage with reglan and zofran at home, pain is worse after eating,

## 2020-04-28 NOTE — Discharge Instructions (Addendum)
As discussed, your symptoms are most suggestive of gastritis or possibly an early peptic ulcer.  I recommend switching to protonix from omeprazole which may help your symptoms better.  Also add the carafate before meals (but not within 4 hours of taking your thyroid medication).  Try to cut back on your nsaid use.  Your lab tests and CT scan are all reassuring with no obvious source of your symptoms.  One further test that may need to be considered is a HIDA scan of your gallbladder to ensure your gallbladder is functioning properly (you have no gallstones or evidence for infection in your gallbladder based on todays tests).  Follow up with Sharon Rowland to discuss whether he thinks this test would be helpful.

## 2020-06-06 ENCOUNTER — Encounter
Admit: 2020-06-06 | Discharge: 2020-06-07 | Payer: PRIVATE HEALTH INSURANCE | Attending: Rehabilitative and Restorative Service Providers" | Primary: Rehabilitative and Restorative Service Providers"

## 2020-06-06 DIAGNOSIS — M238X1 Other internal derangements of right knee: Principal | ICD-10-CM

## 2020-06-06 DIAGNOSIS — M25251 Flail joint, right hip: Principal | ICD-10-CM

## 2020-06-06 DIAGNOSIS — M7061 Trochanteric bursitis, right hip: Principal | ICD-10-CM

## 2020-06-12 ENCOUNTER — Ambulatory Visit: Admit: 2020-06-12 | Discharge: 2020-06-13 | Payer: PRIVATE HEALTH INSURANCE

## 2020-06-14 ENCOUNTER — Ambulatory Visit
Admit: 2020-06-14 | Discharge: 2020-06-15 | Payer: PRIVATE HEALTH INSURANCE | Attending: Rehabilitative and Restorative Service Providers" | Primary: Rehabilitative and Restorative Service Providers"

## 2020-06-14 DIAGNOSIS — M238X2 Other internal derangements of left knee: Principal | ICD-10-CM

## 2020-06-19 ENCOUNTER — Ambulatory Visit: Admit: 2020-06-19 | Payer: PRIVATE HEALTH INSURANCE

## 2020-06-19 ENCOUNTER — Encounter
Admit: 2020-06-19 | Payer: PRIVATE HEALTH INSURANCE | Attending: Rehabilitative and Restorative Service Providers" | Primary: Rehabilitative and Restorative Service Providers"

## 2020-06-21 ENCOUNTER — Encounter
Admit: 2020-06-21 | Discharge: 2020-06-22 | Payer: PRIVATE HEALTH INSURANCE | Attending: Emergency Medicine | Primary: Emergency Medicine

## 2020-06-21 DIAGNOSIS — J039 Acute tonsillitis, unspecified: Principal | ICD-10-CM

## 2020-06-21 DIAGNOSIS — J029 Acute pharyngitis, unspecified: Principal | ICD-10-CM

## 2020-06-21 MED ORDER — CLINDAMYCIN HCL 300 MG CAPSULE
ORAL_CAPSULE | Freq: Four times a day (QID) | ORAL | 0 refills | 7.00000 days | Status: CP
Start: 2020-06-21 — End: 2020-06-28

## 2020-07-23 ENCOUNTER — Ambulatory Visit: Admit: 2020-07-23 | Discharge: 2020-07-24 | Payer: PRIVATE HEALTH INSURANCE

## 2020-07-23 DIAGNOSIS — N3000 Acute cystitis without hematuria: Principal | ICD-10-CM

## 2020-07-23 DIAGNOSIS — R300 Dysuria: Principal | ICD-10-CM

## 2020-07-23 MED ORDER — KETOROLAC 10 MG TABLET
ORAL_TABLET | Freq: Four times a day (QID) | ORAL | 0 refills | 5 days | Status: CP | PRN
Start: 2020-07-23 — End: 2020-07-28

## 2020-07-23 MED ORDER — ONDANSETRON 4 MG DISINTEGRATING TABLET
ORAL_TABLET | Freq: Four times a day (QID) | ORAL | 0 refills | 3 days | Status: CP | PRN
Start: 2020-07-23 — End: ?

## 2020-07-23 MED ORDER — DOXYCYCLINE MONOHYDRATE 100 MG CAPSULE
ORAL_CAPSULE | Freq: Two times a day (BID) | ORAL | 0 refills | 10.00000 days | Status: CP
Start: 2020-07-23 — End: 2020-08-02

## 2020-07-23 MED ORDER — PHENAZOPYRIDINE 100 MG TABLET
ORAL_TABLET | Freq: Three times a day (TID) | ORAL | 0 refills | 4.00000 days | Status: CP | PRN
Start: 2020-07-23 — End: 2020-07-26

## 2020-08-06 ENCOUNTER — Other Ambulatory Visit: Payer: Self-pay

## 2020-08-06 ENCOUNTER — Encounter (HOSPITAL_COMMUNITY): Payer: Self-pay | Admitting: Emergency Medicine

## 2020-08-06 ENCOUNTER — Emergency Department (HOSPITAL_COMMUNITY)
Admission: EM | Admit: 2020-08-06 | Discharge: 2020-08-06 | Disposition: A | Discharge status: Home / Self Care | Payer: BLUE CROSS/BLUE SHIELD | Attending: Emergency Medicine | Admitting: Emergency Medicine

## 2020-08-06 DIAGNOSIS — M545 Low back pain, unspecified: Secondary | ICD-10-CM

## 2020-08-06 DIAGNOSIS — N3000 Acute cystitis without hematuria: Secondary | ICD-10-CM

## 2020-08-06 DIAGNOSIS — J45909 Unspecified asthma, uncomplicated: Secondary | ICD-10-CM | POA: Insufficient documentation

## 2020-08-06 DIAGNOSIS — Z91040 Latex allergy status: Secondary | ICD-10-CM | POA: Insufficient documentation

## 2020-08-06 LAB — URINALYSIS, ROUTINE W REFLEX MICROSCOPIC
Bilirubin Urine: NEGATIVE
Glucose, UA: NEGATIVE mg/dL
Ketones, ur: NEGATIVE mg/dL
Nitrite: POSITIVE — AB
Protein, ur: NEGATIVE mg/dL
Specific Gravity, Urine: 1.003 — ABNORMAL LOW (ref 1.005–1.030)
pH: 6 (ref 5.0–8.0)

## 2020-08-06 LAB — CBC WITH DIFFERENTIAL/PLATELET
Abs Immature Granulocytes: 0.01 10*3/uL (ref 0.00–0.07)
Basophils Absolute: 0 10*3/uL (ref 0.0–0.1)
Basophils Relative: 1 %
Eosinophils Absolute: 0 10*3/uL (ref 0.0–0.5)
Eosinophils Relative: 0 %
HCT: 41.3 % (ref 36.0–46.0)
Hemoglobin: 13.7 g/dL (ref 12.0–15.0)
Immature Granulocytes: 0 %
Lymphocytes Relative: 22 %
Lymphs Abs: 1.7 10*3/uL (ref 0.7–4.0)
MCH: 29.7 pg (ref 26.0–34.0)
MCHC: 33.2 g/dL (ref 30.0–36.0)
MCV: 89.4 fL (ref 80.0–100.0)
Monocytes Absolute: 0.8 10*3/uL (ref 0.1–1.0)
Monocytes Relative: 10 %
Neutro Abs: 5.3 10*3/uL (ref 1.7–7.7)
Neutrophils Relative %: 67 %
Platelets: 242 10*3/uL (ref 150–400)
RBC: 4.62 MIL/uL (ref 3.87–5.11)
RDW: 14.5 % (ref 11.5–15.5)
WBC: 7.8 10*3/uL (ref 4.0–10.5)
nRBC: 0 % (ref 0.0–0.2)

## 2020-08-06 LAB — BASIC METABOLIC PANEL
Anion gap: 12 (ref 5–15)
BUN: 5 mg/dL — ABNORMAL LOW (ref 6–20)
CO2: 24 mmol/L (ref 22–32)
Calcium: 9.3 mg/dL (ref 8.9–10.3)
Chloride: 103 mmol/L (ref 98–111)
Creatinine, Ser: 0.7 mg/dL (ref 0.44–1.00)
GFR calc Af Amer: >60 mL/min (ref >60)
GFR calc non Af Amer: >60 mL/min (ref >60)
Glucose, Bld: 96 mg/dL (ref 70–99)
Potassium: 3.7 mmol/L (ref 3.5–5.1)
Sodium: 139 mmol/L (ref 135–145)

## 2020-08-06 MED ORDER — PREDNISONE 10 MG PO TABS
20.0000 mg | ORAL_TABLET | Freq: Two times a day (BID) | ORAL | 0 refills | Status: DC
Start: 2020-08-06 — End: 2020-08-18

## 2020-08-06 MED ORDER — KETOROLAC TROMETHAMINE 30 MG/ML IJ SOLN
30.0000 mg | Freq: Once | INTRAMUSCULAR | Status: AC
  Administered 2020-08-06: 30 mg via INTRAVENOUS
  Filled 2020-08-06: qty 1

## 2020-08-06 MED ORDER — DOXYCYCLINE HYCLATE 100 MG PO CAPS
100.0000 mg | ORAL_CAPSULE | Freq: Two times a day (BID) | ORAL | 0 refills | Status: DC
Start: 2020-08-06 — End: 2020-08-18

## 2020-08-06 MED ORDER — KETOROLAC TROMETHAMINE 10 MG PO TABS: 10.0000 mg | ORAL_TABLET | Freq: Four times a day (QID) | ORAL | 0 refills | Status: DC | PRN

## 2020-08-06 MED ORDER — SODIUM CHLORIDE 0.9 % IV BOLUS
1000.0000 mL | Freq: Once | INTRAVENOUS | Status: AC
  Administered 2020-08-06: 1000 mL via INTRAVENOUS

## 2020-08-06 MED ORDER — DEXAMETHASONE SODIUM PHOSPHATE 10 MG/ML IJ SOLN
10.0000 mg | Freq: Once | INTRAMUSCULAR | Status: AC
  Administered 2020-08-06: 10 mg via INTRAVENOUS
  Filled 2020-08-06: qty 1

## 2020-08-06 NOTE — ED Triage Notes (Signed)
C/o back pain increasing for months.  Pt self cath and reports having issues with controlling bladder and possible UTI.  Rates pain 9/10.

## 2020-08-06 NOTE — ED Triage Notes (Signed)
Pt is scheduled for MRI on this coming Friday.

## 2020-08-06 NOTE — ED Provider Notes (Signed)
[facility] EMERGENCY DEPARTMENT Provider Note   CSN: [id number] Arrival date & time: 08/06/20  1222     History Chief Complaint  Patient presents with  . Back Pain    Nolita Kutter is a 34 y.o. female.  Patient is a 34 year old female with extensive past medical history including lupus, psoriatic arthritis, SVT, neurogenic bladder for which she performs occasional self catheterizations. She presents today for evaluation of low back pain. This has been present for several weeks, however has been worsening over the past several days. Patient has seen her primary doctor and is scheduled for an MRI later this week. She tells me she is having pain in the sacrum and coccyx region which her primary doctor believes is related to her arthritis. She denies any injury or trauma. She denies any bowel complaints. This pain has been become worse and presents today for this. She denies any fevers or chills. She denies abdominal pain.  The history is provided by the patient.  Back Pain Location:  Sacro-iliac joint Quality:  Aching Radiates to:  Does not radiate Pain severity:  Moderate Timing:  Constant Progression:  Worsening      Past Medical History:  Diagnosis Date  . Allergic rhinitis   . Anemia   . Asthma    last used inhaler 1 month ago  . Dysphagia   . Dysrhythmia    SVT  . Female bladder prolapse   . GERD (gastroesophageal reflux disease)    with pregnancy only  . Lupus ([facility])   . Migraines   . Pelvic pain in antepartum period in second trimester 01/17/2014  . Pregnant 08/01/2015  . PSVT (paroxysmal supraventricular tachycardia) ([facility])    could not be confirmed with 2016 study  . Rectocele   . Thyroid disease   . Urethral prolapse   . Uterine prolapse   . Vaginal Pap smear, abnormal     Patient Active Problem List   Diagnosis Date Noted  . Fall 03/09/2019  . Urinary retention 03/04/2019  . Numbness 03/04/2019  . Lhermitte's sign positive 03/04/2019  . Dysphagia  03/04/2019  . Acute nasopharyngitis 11/25/2018  . Hematuria 11/25/2018  . Burning with urination 11/25/2018  . S/P vaginal hysterectomy 09/17/2017  . Normal labor 08/10/2017  . Uterine/bladder prolapse 02/13/2017  . History of recurrent UTI (urinary tract infection) 02/27/2016  . Migraines 10/03/2015  . Supervision of normal pregnancy 09/05/2015  . Trigger point of shoulder region 06/20/2015  . Rh negative state in antepartum period 01/31/2014  . Abnormal Pap smear of cervix 01/31/2014  . Paroxysmal SVT (supraventricular tachycardia) ([facility]) 11/09/2013  . Asthma, stable 11/09/2013    Past Surgical History:  Procedure Laterality Date  . ANTERIOR AND POSTERIOR VAGINAL REPAIR    . CYSTOCELE REPAIR N/A 09/17/2017   Procedure: ANTERIOR REPAIR (CYSTOCELE);  Surgeon: Lazaro Arms, MD;  Location: [facility];  Service: Gynecology;  Laterality: N/A;  . SUPRAVENTRICULAR TACHYCARDIA ABLATION  01/05/2015   unsuccessful  . SUPRAVENTRICULAR TACHYCARDIA ABLATION N/A 01/05/2015   Procedure: SUPRAVENTRICULAR TACHYCARDIA ABLATION;  Surgeon: Marinus Maw, MD;  Location: [facility] CATH LAB;  Service: Cardiovascular;  Laterality: N/A;  . VAGINAL HYSTERECTOMY N/A 09/17/2017   Procedure: HYSTERECTOMY VAGINAL WITH POSSIBLE BILATERAL SALPINGECTOMY;  Surgeon: Lazaro Arms, MD;  Location: [facility];  Service: Gynecology;  Laterality: N/A;     OB History    Gravida  6   Para  6   Term  6   Preterm      AB  Living  6     SAB      TAB      Ectopic      Multiple  0   Live Births  6           Family History  Problem Relation Age of Onset  . Hypertension Mother   . Hyperlipidemia Mother   . Multiple sclerosis Mother   . COPD Mother   . Cancer Mother        melanoma x 2  . Diabetes Mother   . Heart disease Father        PSVT  . Hyperlipidemia Brother   . Hypertension Brother   . Heart disease Maternal Grandfather        heart attack  . Cancer Paternal Grandmother        pancreatic  cancer  . Heart disease Paternal Grandmother        MI  . Hyperlipidemia Paternal Grandmother   . Hypertension Paternal Grandmother   . Asthma Daughter   . Asthma Son   . Autism Son   . ADD / ADHD Son   . Tourette syndrome Son   . Other Son        Neurological and mental issues  . ADD / ADHD Son     Social History   Tobacco Use  . Smoking status: Never Smoker  . Smokeless tobacco: Never Used  Vaping Use  . Vaping Use: Never used  Substance Use Topics  . Alcohol use: No  . Drug use: No    Home Medications Prior to Admission medications   Medication Sig Start Date End Date Taking? Authorizing Provider  albuterol (PROVENTIL HFA;VENTOLIN HFA) 108 (90 Base) MCG/ACT inhaler Inhale 2 puffs into the lungs every 6 (six) hours as needed for wheezing or shortness of breath.     [provider]  Calcium-Vitamin D-Vitamin K (VIACTIV CALCIUM PLUS D) 650-12.5-40 MG-MCG-MCG CHEW Chew 1 each by mouth daily.    [provider]  clobetasol cream (TEMOVATE) 0.05 % Apply 1 application topically 2 (two) times daily. 04/28/20   [provider]  clobetasol ointment (TEMOVATE) 0.05 % Apply 1 application topically 2 (two) times daily. 04/24/20 04/24/21  [provider]  desonide (DESOWEN) 0.05 % ointment Apply 1 application topically 2 (two) times daily. 04/24/20   [provider]  fludrocortisone (FLORINEF) 0.1 MG tablet Take 100 mcg by mouth daily. 11/25/19   [provider]  folic acid (FOLVITE) 1 MG tablet Take 1 mg by mouth daily. 03/09/20   [provider]  ibuprofen (ADVIL) 200 MG tablet Take 800 mg by mouth every 6 (six) hours as needed for mild pain or moderate pain.    [provider]  levothyroxine (SYNTHROID) 75 MCG tablet Take 75 mcg by mouth daily before breakfast.     [provider]  metoCLOPramide (REGLAN) 5 MG tablet Take 5 mg by mouth 2 (two) times daily. 04/12/20   [provider]  Multiple Vitamin  (MULTIVITAMIN WITH MINERALS) TABS tablet Take 1 tablet by mouth daily.    [provider]  naproxen sodium (ALEVE) 220 MG tablet Take 440 mg by mouth 2 (two) times daily as needed.    [provider]  OnabotulinumtoxinA (BOTOX IJ) Inject 1 Dose as directed every 3 (three) months.    [provider]  ondansetron (ZOFRAN ODT) 4 MG disintegrating tablet Take 1 tablet (4 mg total) by mouth every 8 (eight) hours as needed for nausea or  vomiting. 03/11/20   Triplett, Tammy, PA-C  ondansetron (ZOFRAN) 4 MG tablet Take 4 mg by mouth 3 (three) times daily as needed. 10/14/19   [provider]  pantoprazole (PROTONIX) 40 MG tablet Take 1 tablet (40 mg total) by mouth daily. 04/28/20   Burgess Amor, PA-C  predniSONE (DELTASONE) 2.5 MG tablet Take 10 mg by mouth daily.  10/18/19   [provider]  Rimegepant Sulfate 75 MG TBDP Take 1 tablet by mouth every other day. 04/12/20   [provider]  sucralfate (CARAFATE) 1 g tablet Take 1 tablet (1 g total) by mouth 4 (four) times daily -  with meals and at bedtime. 04/28/20   Burgess Amor, PA-C    Allergies    Epinephrine and Latex  Review of Systems   Review of Systems  Musculoskeletal: Positive for back pain.  All other systems reviewed and are negative.   Physical Exam Updated Vital Signs BP (!) 134/93 (BP Location: Right Arm)   Pulse (!) 120   Temp 98.2 F (36.8 C) (Oral)   Resp 18   Ht 5\' 3"  (1.6 m)   Wt 74.8 kg   LMP 10/23/2016 (Approximate)   SpO2 100%   BMI 29.23 kg/m   Physical Exam Vitals and nursing note reviewed.  Constitutional:      General: She is not in acute distress.    Appearance: She is well-developed. She is not diaphoretic.  HENT:     Head: Normocephalic and atraumatic.  Cardiovascular:     Heart sounds: No murmur heard.  No friction rub. No gallop.   Pulmonary:     Effort: Pulmonary effort is normal.  Musculoskeletal:        General: Normal range of motion.      Comments: There is tenderness to palpation in the area of the sacrum. There is no obvious abnormality to palpation or visual exam.  Skin:    General: Skin is warm and dry.  Neurological:     Mental Status: She is alert and oriented to person, place, and time.     Comments: Strength is 5 out of 5 in both lower extremities. DTRs are trace and symmetrical in the patellar tendons.     ED Results / Procedures / Treatments   Labs (all labs ordered are listed, but only abnormal results are displayed) Labs Reviewed  BASIC METABOLIC PANEL  CBC WITH DIFFERENTIAL/PLATELET  URINALYSIS, ROUTINE W REFLEX MICROSCOPIC  I-STAT BETA HCG BLOOD, ED ([facility], [facility], [facility] ONLY)    EKG None  Radiology No results found.  Procedures Procedures (including critical care time)  Medications Ordered in ED Medications  sodium chloride 0.9 % bolus 1,000 mL (has no administration in time range)  ketorolac (TORADOL) 30 MG/ML injection 30 mg (has no administration in time range)    ED Course  I have reviewed the triage vital signs and the nursing notes.  Pertinent labs & imaging results that were available during my care of the patient were reviewed by me and considered in my medical decision making (see chart for details).    MDM Rules/Calculators/A&P  Patient is a 34 year old female presenting with complaints of low back pain. She has extensive past medical history as outlined in the HPI. Patient feels as though she has inflammation related to psoriatic arthritis that is causing her sacrum to hurt. She has also been performing self caths and is worried she may have a UTI.  On exam, there are no focal deficits and no red flags  that would suggest an emergent cause of her back pain. She does have evidence for UTI in her urinalysis, but laboratory studies are otherwise unremarkable.  She was initially tachycardic upon presentation, however this has improved with IV fluids and Toradol.  At this point, I see no  indication for admission. As she is feeling better, I feel as though she can safely be discharged with outpatient follow-up. She will be prescribed antibiotics, prednisone, and is to follow-up as needed/as scheduled.  Final Clinical Impression(s) / ED Diagnoses Final diagnoses:  None    Rx / DC Orders ED Discharge Orders    None       Geoffery Lyons, MD 08/06/20 1427

## 2020-08-06 NOTE — Discharge Instructions (Signed)
Begin taking doxycycline and prednisone as prescribed.  Take Toradol as prescribed as needed for pain.  Follow-up with your primary doctor if symptoms or not improving in the next few days. Keep your appointment for your MRI as previously arranged.

## 2020-08-07 LAB — URINE CULTURE: Culture: <10000 — AB

## 2020-08-18 ENCOUNTER — Encounter: Payer: Self-pay | Admitting: Obstetrics & Gynecology

## 2020-08-18 ENCOUNTER — Ambulatory Visit (INDEPENDENT_AMBULATORY_CARE_PROVIDER_SITE_OTHER): Payer: BLUE CROSS/BLUE SHIELD | Admitting: Obstetrics & Gynecology

## 2020-08-18 VITALS — BP 117/85 | HR 104 | Ht 63.0 in | Wt 174.0 lb

## 2020-08-18 DIAGNOSIS — B085 Enteroviral vesicular pharyngitis: Secondary | ICD-10-CM | POA: Diagnosis not present

## 2020-08-18 DIAGNOSIS — N3010 Interstitial cystitis (chronic) without hematuria: Secondary | ICD-10-CM

## 2020-08-18 MED ORDER — VALACYCLOVIR HCL 1 G PO TABS
1000.0000 mg | ORAL_TABLET | Freq: Three times a day (TID) | ORAL | 2 refills | Status: DC
Start: 2020-08-18 — End: 2020-11-09

## 2020-08-18 NOTE — Progress Notes (Signed)
Chief Complaint  Patient presents with  . discuss neurogenic bladder      34 y.o. [id number] Patient's last menstrual period was 10/23/2016 (approximate). The current method of family planning is status post hysterectomy.  Outpatient Encounter Medications as of 08/18/2020  Medication Sig Note  . albuterol (PROVENTIL HFA;VENTOLIN HFA) 108 (90 Base) MCG/ACT inhaler Inhale 2 puffs into the lungs every 6 (six) hours as needed for wheezing or shortness of breath.    . Calcium-Vitamin D-Vitamin K (VIACTIV CALCIUM PLUS D) 650-12.5-40 MG-MCG-MCG CHEW Chew 1 each by mouth daily.   . clobetasol ointment (TEMOVATE) 0.05 % Apply 1 application topically 2 (two) times daily.   Marland Kitchen desonide (DESOWEN) 0.05 % ointment Apply 1 application topically 2 (two) times daily.   . fludrocortisone (FLORINEF) 0.1 MG tablet Take 100 mcg by mouth daily.   . folic acid (FOLVITE) 1 MG tablet Take 1 mg by mouth daily.   Marland Kitchen ibuprofen (ADVIL) 200 MG tablet Take 800 mg by mouth every 6 (six) hours as needed for mild pain or moderate pain.   Marland Kitchen levothyroxine (SYNTHROID) 75 MCG tablet Take 75 mcg by mouth daily before breakfast.    . metoCLOPramide (REGLAN) 5 MG tablet Take 5 mg by mouth 2 (two) times daily.   . Multiple Vitamin (MULTIVITAMIN WITH MINERALS) TABS tablet Take 1 tablet by mouth daily.   . OnabotulinumtoxinA (BOTOX IJ) Inject 1 Dose as directed every 30 (thirty) days.    . Rimegepant Sulfate 75 MG TBDP Take 1 tablet by mouth daily as needed.    . [DISCONTINUED] ondansetron (ZOFRAN ODT) 4 MG disintegrating tablet Take 1 tablet (4 mg total) by mouth every 8 (eight) hours as needed for nausea or vomiting.   . [DISCONTINUED] ondansetron (ZOFRAN) 4 MG tablet Take 4 mg by mouth 3 (three) times daily as needed. 03/11/2020: Patient attempted to take two doses on 03/11/2020 but immediately got sick and vomited medication  . [DISCONTINUED] doxycycline (VIBRAMYCIN) 100 MG capsule Take 1 capsule (100 mg total) by mouth 2 (two)  times daily. One po bid x 7 days   . [DISCONTINUED] ketorolac (TORADOL) 10 MG tablet Take 1 tablet (10 mg total) by mouth every 6 (six) hours as needed.   . [DISCONTINUED] predniSONE (DELTASONE) 10 MG tablet Take 2 tablets (20 mg total) by mouth 2 (two) times daily with a meal.   . [DISCONTINUED] valACYclovir (VALTREX) 1000 MG tablet Take 1 tablet (1,000 mg total) by mouth 3 (three) times daily. (Patient not taking: Reported on 10/24/2020)    No facility-administered encounter medications on file as of 08/18/2020.    Subjective Sharon Rowland is in today appropriately concerned and somewhat disheartened about her ongoing neurogenic bladder issues She of course has several autoimmune issues ongoing which are being managed and does not really carry the diagnosis of multiple sclerosis I do not believe per se but has some variant of that and they are watching that closely She is followed by neurologist at [facility] She certainly has Hashimoto's and rheumatological and other vasculopathic symptoms Past Medical History:  Diagnosis Date  . Allergic rhinitis   . Anemia   . Arthritis   . Asthma    last used inhaler 1 month ago  . Dysphagia   . Dysrhythmia    SVT  . Female bladder prolapse   . GERD (gastroesophageal reflux disease)    with pregnancy only  . Lupus (HCC)   . Migraines   . Pelvic pain in antepartum period in  second trimester 01/17/2014  . Pregnant 08/01/2015  . PSVT (paroxysmal supraventricular tachycardia) (HCC)    could not be confirmed with 2016 study  . Rectocele   . Thyroid disease   . Urethral prolapse   . Uterine prolapse   . Vaginal Pap smear, abnormal     Past Surgical History:  Procedure Laterality Date  . ABDOMINAL HYSTERECTOMY    . ANTERIOR AND POSTERIOR VAGINAL REPAIR    . CYSTOCELE REPAIR N/A 09/17/2017   Procedure: ANTERIOR REPAIR (CYSTOCELE);  Surgeon: Lazaro Arms, MD;  Location: [facility];  Service: Gynecology;  Laterality: N/A;  . SUPRAVENTRICULAR TACHYCARDIA  ABLATION  01/05/2015   unsuccessful  . SUPRAVENTRICULAR TACHYCARDIA ABLATION N/A 01/05/2015   Procedure: SUPRAVENTRICULAR TACHYCARDIA ABLATION;  Surgeon: Marinus Maw, MD;  Location: [facility] CATH [facility];  Service: Cardiovascular;  Laterality: N/A;  . VAGINAL HYSTERECTOMY N/A 09/17/2017   Procedure: HYSTERECTOMY VAGINAL WITH POSSIBLE BILATERAL SALPINGECTOMY;  Surgeon: Lazaro Arms, MD;  Location: [facility];  Service: Gynecology;  Laterality: N/A;    OB History    Gravida  6   Para  6   Term  6   Preterm      AB      Living  6     SAB      IAB      Ectopic      Multiple  0   Live Births  6           Allergies  Allergen Reactions  . Plaquenil [Hydroxychloroquine] Other (See Comments)    Prolonged QT  . Latex Dermatitis    Social History   Socioeconomic History  . Marital status: Married    Spouse name: Not on file  . Number of children: 6  . Years of education: HS  . Highest education level: Not on file  Occupational History  . Occupation: Web designer  Tobacco Use  . Smoking status: Never Smoker  . Smokeless tobacco: Never Used  Vaping Use  . Vaping Use: Never used  Substance and Sexual Activity  . Alcohol use: No  . Drug use: No  . Sexual activity: Yes    Birth control/protection: Surgical    Comment: hyst  Other Topics Concern  . Not on file  Social History Narrative   Lives with husband   Right handed    Caffeine use: 1-2 diet cokes per day   Social Determinants of Health   Financial Resource Strain: Not on file  Food Insecurity: Not on file  Transportation Needs: Not on file  Physical Activity: Not on file  Stress: Not on file  Social Connections: Not on file    Family History  Problem Relation Age of Onset  . Hypertension Mother   . Hyperlipidemia Mother   . Multiple sclerosis Mother   . COPD Mother   . Cancer Mother        melanoma x 2  . Diabetes Mother   . Heart disease Father        PSVT  . Hyperlipidemia  Brother   . Hypertension Brother   . Heart disease Maternal Grandfather        heart attack  . Cancer Paternal Grandmother        pancreatic cancer  . Heart disease Paternal Grandmother        MI  . Hyperlipidemia Paternal Grandmother   . Hypertension Paternal Grandmother   . Asthma Daughter   . Asthma Son   . Autism Son   .  ADD / ADHD Son   . Tourette syndrome Son   . Other Son        Neurological and mental issues  . ADD / ADHD Son     Medications:       Current Outpatient Medications:  .  albuterol (PROVENTIL HFA;VENTOLIN HFA) 108 (90 Base) MCG/ACT inhaler, Inhale 2 puffs into the lungs every 6 (six) hours as needed for wheezing or shortness of breath. , Disp: , Rfl:  .  Calcium-Vitamin D-Vitamin K (VIACTIV CALCIUM PLUS D) 650-12.5-40 MG-MCG-MCG CHEW, Chew 1 each by mouth daily., Disp: , Rfl:  .  clobetasol ointment (TEMOVATE) 0.05 %, Apply 1 application topically 2 (two) times daily., Disp: , Rfl:  .  desonide (DESOWEN) 0.05 % ointment, Apply 1 application topically 2 (two) times daily., Disp: , Rfl:  .  fludrocortisone (FLORINEF) 0.1 MG tablet, Take 100 mcg by mouth daily., Disp: , Rfl:  .  folic acid (FOLVITE) 1 MG tablet, Take 1 mg by mouth daily., Disp: , Rfl:  .  ibuprofen (ADVIL) 200 MG tablet, Take 800 mg by mouth every 6 (six) hours as needed for mild pain or moderate pain., Disp: , Rfl:  .  levothyroxine (SYNTHROID) 75 MCG tablet, Take 75 mcg by mouth daily before breakfast. , Disp: , Rfl:  .  metoCLOPramide (REGLAN) 5 MG tablet, Take 5 mg by mouth 2 (two) times daily., Disp: , Rfl:  .  Multiple Vitamin (MULTIVITAMIN WITH MINERALS) TABS tablet, Take 1 tablet by mouth daily., Disp: , Rfl:  .  OnabotulinumtoxinA (BOTOX IJ), Inject 1 Dose as directed every 30 (thirty) days. , Disp: , Rfl:  .  Rimegepant Sulfate 75 MG TBDP, Take 1 tablet by mouth daily as needed. , Disp: , Rfl:  .  Liraglutide -Weight Management (SAXENDA) 18 MG/3ML SOPN, Inject 0.6 mLs into the skin daily.,  Disp: , Rfl:  .  methocarbamol (ROBAXIN) 500 MG tablet, Take 500 mg by mouth 2 (two) times daily., Disp: , Rfl:  .  nitrofurantoin, macrocrystal-monohydrate, (MACROBID) 100 MG capsule, Take 1 po as directed (Patient not taking: Reported on 11/12/2020), Disp: 30 capsule, Rfl: 3 .  phentermine (ADIPEX-P) 37.5 MG tablet, Take 37.5 mg by mouth daily before breakfast. , Disp: , Rfl:  .  pregabalin (LYRICA) 25 MG capsule, , Disp: , Rfl:  .  pregabalin (LYRICA) 50 MG capsule, Take 50 mg by mouth 2 (two) times daily., Disp: , Rfl:  .  UBRELVY 100 MG TABS, Take by mouth. (Patient not taking: Reported on 11/12/2020), Disp: , Rfl:   Objective Blood pressure 117/85, pulse (!) 104, height 5\' 3"  (1.6 m), weight 174 lb (78.9 kg), last menstrual period 10/23/2016.  Well-developed well-nourished no acute distress emotional today 79 kg  Pertinent ROS No burning with urination, frequency or urgency No nausea, vomiting or diarrhea Nor fever chills or other constitutional symptoms   Labs or studies No new    Impression Diagnoses this Encounter::   ICD-10-CM   1. Pharyngitis due to Coxsackie virus  B08.5   2. Interstitial cystitis, presumed, will start a course of DMSO irrigations  N30.10     Established relevant diagnosis(es):   Plan/Recommendations: Meds ordered this encounter  Medications  . DISCONTD: valACYclovir (VALTREX) 1000 MG tablet    Sig: Take 1 tablet (1,000 mg total) by mouth 3 (three) times daily.    Dispense:  30 tablet    Refill:  2    Labs or Scans Ordered: No orders of the defined types  were placed in this encounter.   Management:: >DMSO irrigations to begin next week  Follow up Return in about 1 week (around 08/25/2020) for DMSO irrigation, with Dr Despina Hidden.        Face to face time:  15 minutes  Greater than 50% of the visit time was spent in counseling and coordination of care with the patient.  The summary and outline of the counseling and care coordination is  summarized in the note above.   All questions were answered.

## 2020-08-21 ENCOUNTER — Other Ambulatory Visit
Admission: RE | Admit: 2020-08-21 | Discharge: 2020-08-21 | Disposition: A | Discharge status: Home / Self Care | Payer: BLUE CROSS/BLUE SHIELD | Source: Ambulatory Visit | Attending: Specialist | Admitting: Specialist

## 2020-08-21 DIAGNOSIS — R0789 Other chest pain: Secondary | ICD-10-CM | POA: Diagnosis present

## 2020-08-21 LAB — FIBRIN DERIVATIVES D-DIMER (ARMC ONLY): Fibrin derivatives D-dimer (ARMC): 102.95 ng/mL (FEU) (ref 0.00–499.00)

## 2020-08-25 ENCOUNTER — Ambulatory Visit: Payer: BLUE CROSS/BLUE SHIELD | Admitting: Obstetrics & Gynecology

## 2020-10-10 ENCOUNTER — Other Ambulatory Visit: Payer: Self-pay

## 2020-10-10 ENCOUNTER — Emergency Department (HOSPITAL_COMMUNITY): Payer: BLUE CROSS/BLUE SHIELD

## 2020-10-10 ENCOUNTER — Encounter (HOSPITAL_COMMUNITY): Payer: Self-pay | Admitting: *Deleted

## 2020-10-10 ENCOUNTER — Emergency Department (HOSPITAL_COMMUNITY)
Admission: EM | Admit: 2020-10-10 | Discharge: 2020-10-10 | Disposition: A | Discharge status: Home / Self Care | Payer: BLUE CROSS/BLUE SHIELD | Attending: Emergency Medicine | Admitting: Emergency Medicine

## 2020-10-10 DIAGNOSIS — Z5321 Procedure and treatment not carried out due to patient leaving prior to being seen by health care provider: Secondary | ICD-10-CM | POA: Diagnosis not present

## 2020-10-10 DIAGNOSIS — R079 Chest pain, unspecified: Secondary | ICD-10-CM | POA: Diagnosis not present

## 2020-10-10 MED ORDER — ASPIRIN 81 MG PO CHEW: 324.0000 mg | CHEWABLE_TABLET | Freq: Once | ORAL | Status: DC

## 2020-10-10 NOTE — ED Triage Notes (Signed)
Pt with mid chest pain, feels heavy.  Hard to breathe with laying down.

## 2020-10-13 DIAGNOSIS — L4050 Arthropathic psoriasis, unspecified: Principal | ICD-10-CM

## 2020-10-14 IMAGING — CT CT ABD-PELV W/ CM
2 of 4 series · 16 of 46 positions shown, 18 images · IV contrast (Omnipaque or Isovue)
Comparison: None.

CLINICAL DATA: Nausea, vomiting.

EXAM:
CT ABDOMEN AND PELVIS WITH CONTRAST
TECHNIQUE: Multidetector CT imaging of the abdomen and pelvis was performed
using the standard protocol following bolus administration of
intravenous contrast.
CONTRAST:  100mL OMNIPAQUE IOHEXOL 300 MG/ML  SOLN

[Series 2: axial st · axial · 0.77mm/px · z∈[+895,+1370]mm · 13 of 105 slices shown, 15 images]
[im 5/105  soft-tissue]
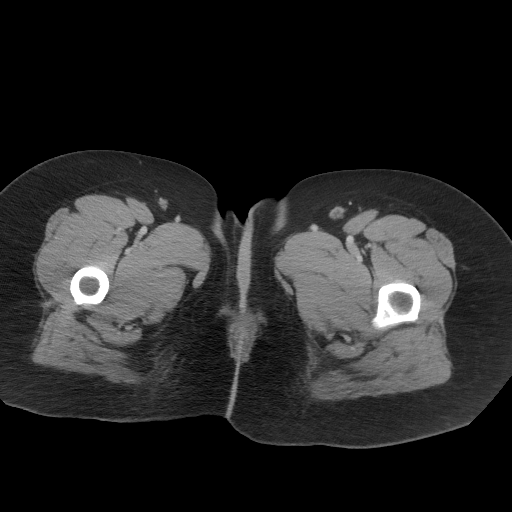
[im 5/105  bone]
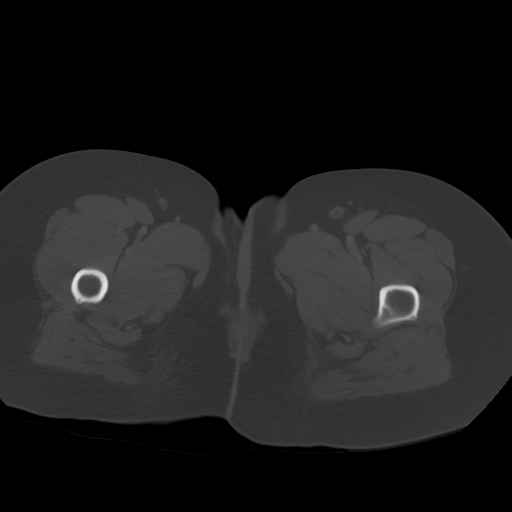
[im 15/105  soft-tissue]
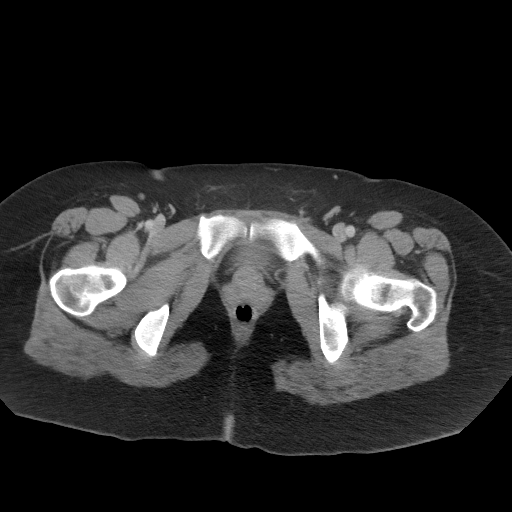
[im 20/105  soft-tissue]
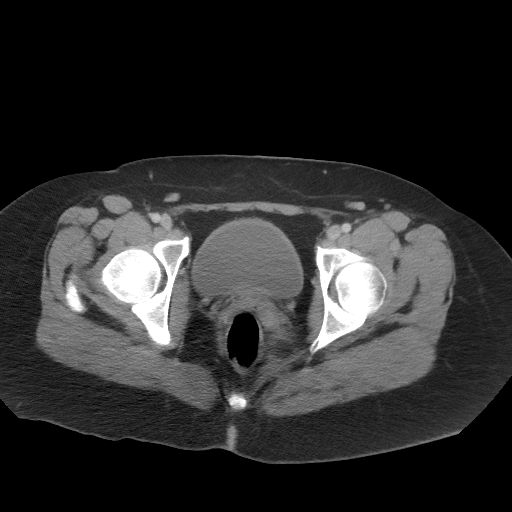
[im 30/105  soft-tissue]
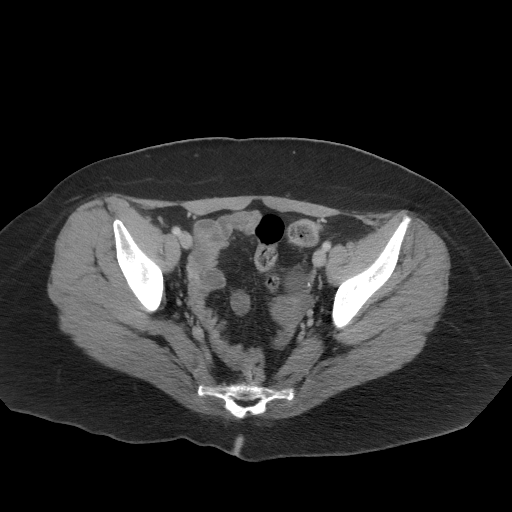
[im 35/105  soft-tissue]
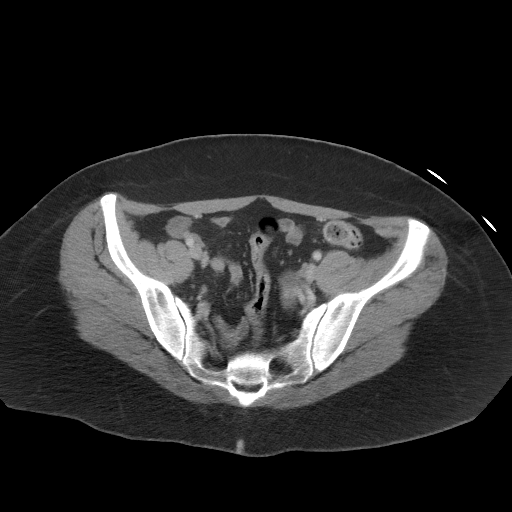
[im 45/105  soft-tissue]
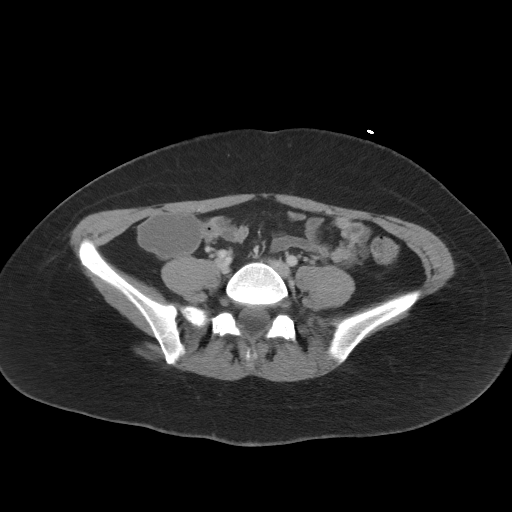
[im 55/105  soft-tissue]
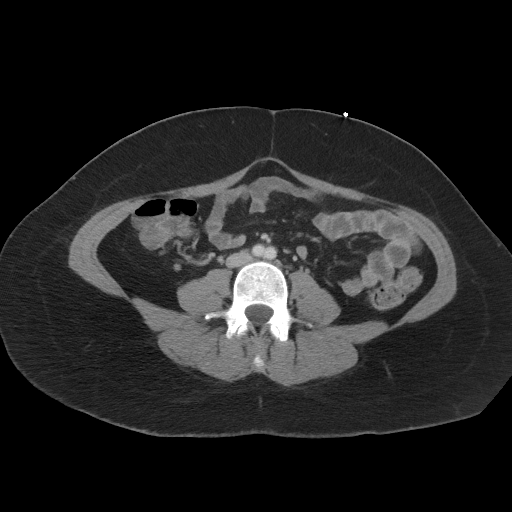
[im 60/105  soft-tissue]
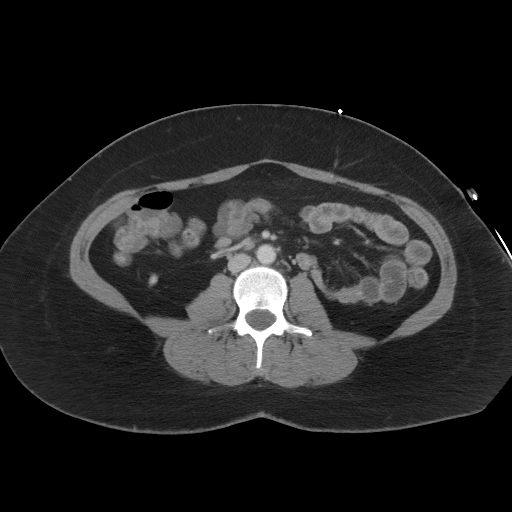
[im 70/105  soft-tissue]
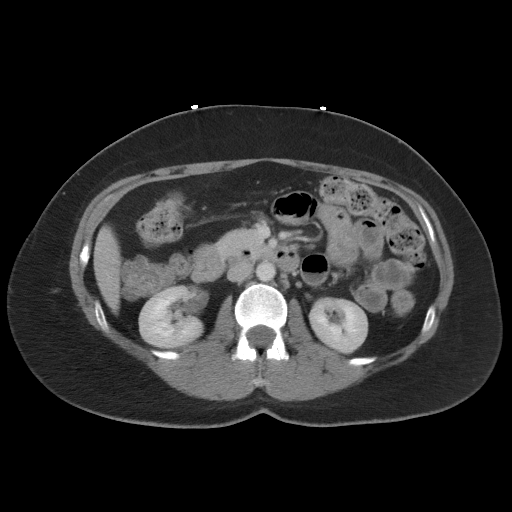
[im 70/105  bone]
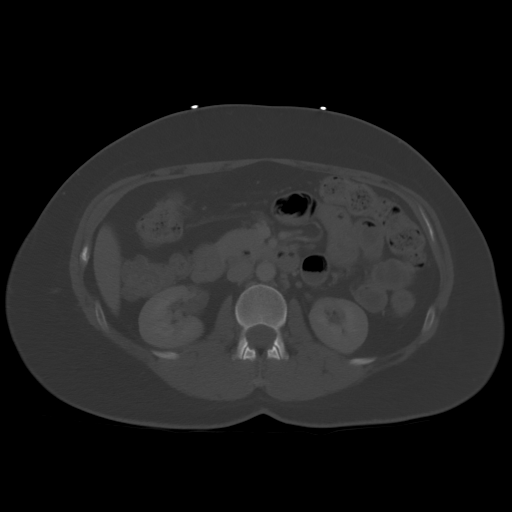
[im 75/105  soft-tissue]
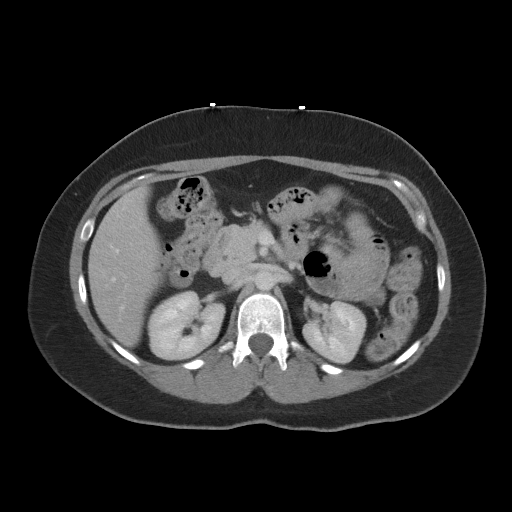
[im 85/105  soft-tissue]
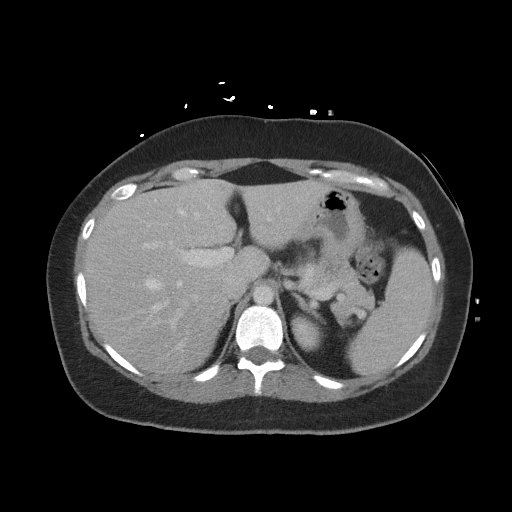
[im 90/105  soft-tissue]
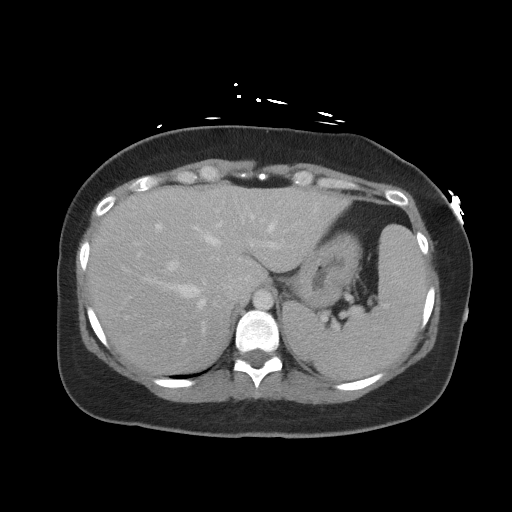
[im 100/105  soft-tissue]
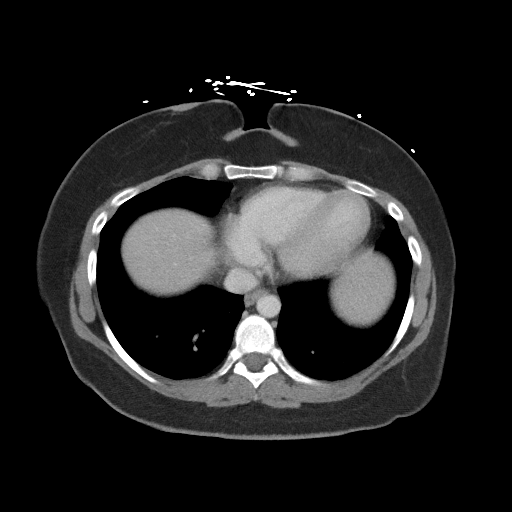

[Series 5: coronal st · coronal · 0.76mm/px · 3 of 99 slices shown]
[im 33/99  soft-tissue]
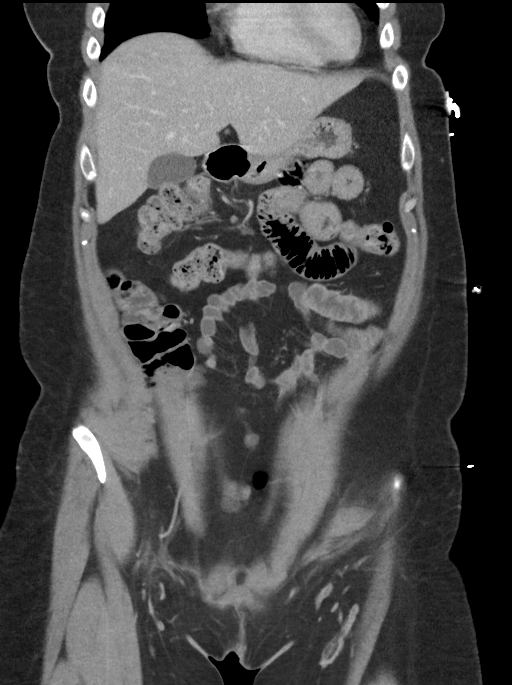
[im 44/99  soft-tissue]
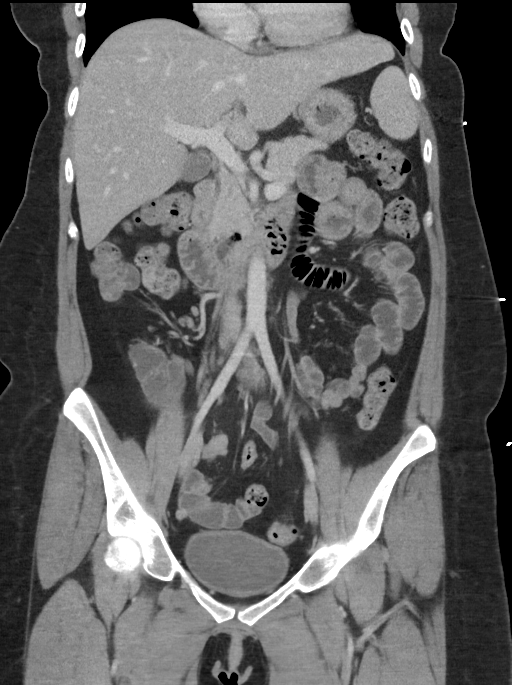
[im 55/99  soft-tissue]
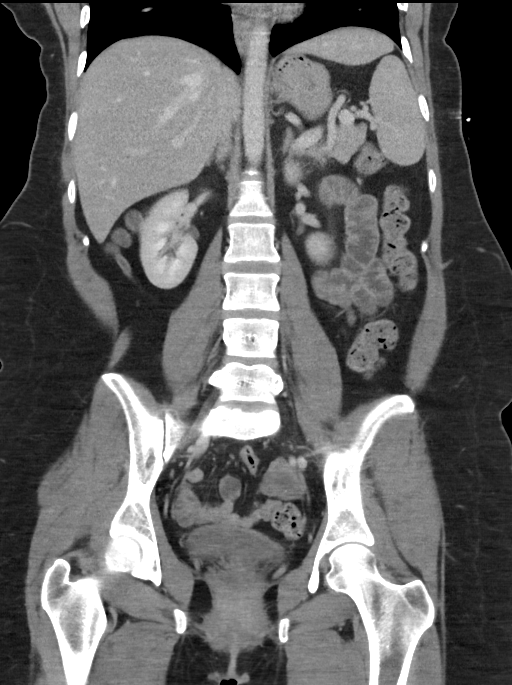

[16 of 46 positions shown; findings below may reference images not displayed]

FINDINGS: Lower chest: No acute abnormality.

Hepatobiliary: No focal liver abnormality is seen. No gallstones,
gallbladder wall thickening, or biliary dilatation.

Pancreas: Unremarkable. No pancreatic ductal dilatation or
surrounding inflammatory changes.

Spleen: Normal in size without focal abnormality.

Adrenals/Urinary Tract: Adrenal glands are unremarkable. Kidneys are
normal, without renal calculi, focal lesion, or hydronephrosis.
Bladder is unremarkable.

Stomach/Bowel: Stomach is within normal limits. Appendix appears
normal. No evidence of bowel wall thickening, distention, or
inflammatory changes.

Vascular/Lymphatic: No significant vascular findings are present. No
enlarged abdominal or pelvic lymph nodes.

Reproductive: Status post hysterectomy. No adnexal masses.

Other: No abdominal wall hernia or abnormality. No abdominopelvic
ascites.

Musculoskeletal: No acute or significant osseous findings.
IMPRESSION: No acute abnormality seen in the abdomen or pelvis.

## 2020-10-24 ENCOUNTER — Encounter (HOSPITAL_COMMUNITY): Payer: Self-pay

## 2020-10-24 ENCOUNTER — Emergency Department (HOSPITAL_COMMUNITY): Payer: BLUE CROSS/BLUE SHIELD

## 2020-10-24 ENCOUNTER — Other Ambulatory Visit: Payer: Self-pay

## 2020-10-24 ENCOUNTER — Encounter (HOSPITAL_COMMUNITY): Payer: Self-pay | Admitting: Student

## 2020-10-24 ENCOUNTER — Emergency Department (HOSPITAL_COMMUNITY)
Admission: EM | Admit: 2020-10-24 | Discharge: 2020-10-24 | Disposition: A | Discharge status: Home / Self Care | Payer: BLUE CROSS/BLUE SHIELD | Attending: Emergency Medicine | Admitting: Emergency Medicine

## 2020-10-24 DIAGNOSIS — Z20822 Contact with and (suspected) exposure to covid-19: Secondary | ICD-10-CM | POA: Diagnosis not present

## 2020-10-24 DIAGNOSIS — Z79899 Other long term (current) drug therapy: Secondary | ICD-10-CM | POA: Diagnosis not present

## 2020-10-24 DIAGNOSIS — J45909 Unspecified asthma, uncomplicated: Secondary | ICD-10-CM | POA: Insufficient documentation

## 2020-10-24 DIAGNOSIS — Z7984 Long term (current) use of oral hypoglycemic drugs: Secondary | ICD-10-CM | POA: Insufficient documentation

## 2020-10-24 DIAGNOSIS — Z91040 Latex allergy status: Secondary | ICD-10-CM | POA: Insufficient documentation

## 2020-10-24 DIAGNOSIS — R0602 Shortness of breath: Secondary | ICD-10-CM | POA: Diagnosis not present

## 2020-10-24 HISTORY — DX: Unspecified osteoarthritis, unspecified site: M19.90

## 2020-10-24 LAB — D-DIMER, QUANTITATIVE: D-Dimer, Quant: 0.74 ug/mL-FEU — ABNORMAL HIGH (ref 0.00–0.50)

## 2020-10-24 LAB — CBC WITH DIFFERENTIAL/PLATELET
Abs Immature Granulocytes: 0.03 10*3/uL (ref 0.00–0.07)
Basophils Absolute: 0 10*3/uL (ref 0.0–0.1)
Basophils Relative: 1 %
Eosinophils Absolute: 0 10*3/uL (ref 0.0–0.5)
Eosinophils Relative: 0 %
HCT: 40.8 % (ref 36.0–46.0)
Hemoglobin: 13.7 g/dL (ref 12.0–15.0)
Immature Granulocytes: 0 %
Lymphocytes Relative: 24 %
Lymphs Abs: 1.9 10*3/uL (ref 0.7–4.0)
MCH: 29 pg (ref 26.0–34.0)
MCHC: 33.6 g/dL (ref 30.0–36.0)
MCV: 86.4 fL (ref 80.0–100.0)
Monocytes Absolute: 0.6 10*3/uL (ref 0.1–1.0)
Monocytes Relative: 8 %
Neutro Abs: 5.4 10*3/uL (ref 1.7–7.7)
Neutrophils Relative %: 67 %
Platelets: 281 10*3/uL (ref 150–400)
RBC: 4.72 MIL/uL (ref 3.87–5.11)
RDW: 13 % (ref 11.5–15.5)
WBC: 8 10*3/uL (ref 4.0–10.5)
nRBC: 0 % (ref 0.0–0.2)

## 2020-10-24 LAB — RESP PANEL BY RT PCR (RSV, FLU A&B, COVID)
Influenza A by PCR: NEGATIVE
Influenza B by PCR: NEGATIVE
Respiratory Syncytial Virus by PCR: NEGATIVE
SARS Coronavirus 2 by RT PCR: NEGATIVE

## 2020-10-24 LAB — COMPREHENSIVE METABOLIC PANEL
ALT: 23 U/L (ref 0–44)
AST: 12 U/L — ABNORMAL LOW (ref 15–41)
Albumin: 4.7 g/dL (ref 3.5–5.0)
Alkaline Phosphatase: 45 U/L (ref 38–126)
Anion gap: 8 (ref 5–15)
BUN: 6 mg/dL (ref 6–20)
CO2: 24 mmol/L (ref 22–32)
Calcium: 9.2 mg/dL (ref 8.9–10.3)
Chloride: 102 mmol/L (ref 98–111)
Creatinine, Ser: 0.62 mg/dL (ref 0.44–1.00)
GFR, Estimated: >60 mL/min (ref >60)
Glucose, Bld: 90 mg/dL (ref 70–99)
Potassium: 3.5 mmol/L (ref 3.5–5.1)
Sodium: 134 mmol/L — ABNORMAL LOW (ref 135–145)
Total Bilirubin: 1 mg/dL (ref 0.3–1.2)
Total Protein: 7.5 g/dL (ref 6.5–8.1)

## 2020-10-24 LAB — TROPONIN I (HIGH SENSITIVITY)
Troponin I (High Sensitivity): <2 ng/L (ref <18)
Troponin I (High Sensitivity): <2 ng/L (ref <18)

## 2020-10-24 MED ORDER — SODIUM CHLORIDE 0.9 % IV BOLUS
1000.0000 mL | Freq: Once | INTRAVENOUS | Status: AC
  Administered 2020-10-24: 1000 mL via INTRAVENOUS

## 2020-10-24 MED ORDER — IOHEXOL 350 MG/ML SOLN
100.0000 mL | Freq: Once | INTRAVENOUS | Status: AC | PRN
  Administered 2020-10-24: 100 mL via INTRAVENOUS

## 2020-10-24 NOTE — ED Notes (Signed)
Pt transported to xray 

## 2020-10-24 NOTE — Discharge Instructions (Signed)
Seen for shortness of breath.  Lab work and imaging all looks reassuring.  I recommend continuing with your home medications as prescribed.  I would follow-up with your PCP for further evaluation management.  Come back to the emergency department if you develop chest pain, shortness of breath, severe abdominal pain, uncontrolled nausea, vomiting, diarrhea.

## 2020-10-24 NOTE — ED Triage Notes (Signed)
Pt has cardiac history, is SOB and has rib pain,  Denies covid exposure.  Chest pain 4/10 pain pt has lido patches

## 2020-10-24 NOTE — ED Provider Notes (Addendum)
[facility] EMERGENCY DEPARTMENT Provider Note   CSN: [id number] Arrival date & time: 10/24/20  1133     History Chief Complaint  Patient presents with  . Chest Pain  . Shortness of Breath    Sharon Rowland is a 34 y.o. female.  HPI   Patient with significant medical history of SVT, POTS, hypothyroid disease,  undifferential autoimmune disease presents to the emergency department with chief complaint of shortness of breath.  Patient endorses that for the last week she has been feeling more more short of breath.  She states she becomes short of breath on exertion and feels like she can never get a full breath of air.  She complains of bilateral rib pain which is restricting her from breathing properly.  She denies  chest pain but endorses that when she stands up she feels like her heart is racing.  She denies becoming diaphoretic, nausea, vomiting, dizziness paresthesias or weakness in the upper or lower extremities.  Patient states she is vaccinated for COVID-19, denies recent sick contacts or recent travels, she denies fevers, chills, nausea, vomiting, diarrhea or general malaise.  She denies history of DVTs or PEs, denies leg pain or leg swelling, recent surgeries, no long immobilization and is not on hormone therapy.  Patient was seen at her pulmonologist on 10/27 where she was seen for the same complaint and they felt she has neuromuscular issues.  She  denies recent changes in her thyroid medication, has been compliant with her POTS medication.  She denies headaches, fevers, chills, nasal congestion, sore throat, cough, abdominal pain, nausea, vomiting, diarrhea, pedal edema.  Past Medical History:  Diagnosis Date  . Allergic rhinitis   . Anemia   . Arthritis   . Asthma    last used inhaler 1 month ago  . Dysphagia   . Dysrhythmia    SVT  . Female bladder prolapse   . GERD (gastroesophageal reflux disease)    with pregnancy only  . Lupus (HCC)   . Migraines   . Pelvic pain  in antepartum period in second trimester 01/17/2014  . Pregnant 08/01/2015  . PSVT (paroxysmal supraventricular tachycardia) (HCC)    could not be confirmed with 2016 study  . Rectocele   . Thyroid disease   . Urethral prolapse   . Uterine prolapse   . Vaginal Pap smear, abnormal     Patient Active Problem List   Diagnosis Date Noted  . Fall 03/09/2019  . Urinary retention 03/04/2019  . Numbness 03/04/2019  . Lhermitte's sign positive 03/04/2019  . Dysphagia 03/04/2019  . Acute nasopharyngitis 11/25/2018  . Hematuria 11/25/2018  . Burning with urination 11/25/2018  . S/P vaginal hysterectomy 09/17/2017  . Normal labor 08/10/2017  . Uterine/bladder prolapse 02/13/2017  . History of recurrent UTI (urinary tract infection) 02/27/2016  . Migraines 10/03/2015  . Supervision of normal pregnancy 09/05/2015  . Trigger point of shoulder region 06/20/2015  . Rh negative state in antepartum period 01/31/2014  . Abnormal Pap smear of cervix 01/31/2014  . Paroxysmal SVT (supraventricular tachycardia) (HCC) 11/09/2013  . Asthma, stable 11/09/2013    Past Surgical History:  Procedure Laterality Date  . ABDOMINAL HYSTERECTOMY    . ANTERIOR AND POSTERIOR VAGINAL REPAIR    . CYSTOCELE REPAIR N/A 09/17/2017   Procedure: ANTERIOR REPAIR (CYSTOCELE);  Surgeon: Lazaro Arms, MD;  Location: [facility];  Service: Gynecology;  Laterality: N/A;  . SUPRAVENTRICULAR TACHYCARDIA ABLATION  01/05/2015   unsuccessful  . SUPRAVENTRICULAR TACHYCARDIA ABLATION N/A 01/05/2015  Procedure: SUPRAVENTRICULAR TACHYCARDIA ABLATION;  Surgeon: Marinus Maw, MD;  Location: [facility] CATH LAB;  Service: Cardiovascular;  Laterality: N/A;  . VAGINAL HYSTERECTOMY N/A 09/17/2017   Procedure: HYSTERECTOMY VAGINAL WITH POSSIBLE BILATERAL SALPINGECTOMY;  Surgeon: Lazaro Arms, MD;  Location: [facility];  Service: Gynecology;  Laterality: N/A;     OB History    Gravida  6   Para  6   Term  6   Preterm      AB      Living  6       SAB      TAB      Ectopic      Multiple  0   Live Births  6           Family History  Problem Relation Age of Onset  . Hypertension Mother   . Hyperlipidemia Mother   . Multiple sclerosis Mother   . COPD Mother   . Cancer Mother        melanoma x 2  . Diabetes Mother   . Heart disease Father        PSVT  . Hyperlipidemia Brother   . Hypertension Brother   . Heart disease Maternal Grandfather        heart attack  . Cancer Paternal Grandmother        pancreatic cancer  . Heart disease Paternal Grandmother        MI  . Hyperlipidemia Paternal Grandmother   . Hypertension Paternal Grandmother   . Asthma Daughter   . Asthma Son   . Autism Son   . ADD / ADHD Son   . Tourette syndrome Son   . Other Son        Neurological and mental issues  . ADD / ADHD Son     Social History   Tobacco Use  . Smoking status: Never Smoker  . Smokeless tobacco: Never Used  Vaping Use  . Vaping Use: Never used  Substance Use Topics  . Alcohol use: No  . Drug use: No    Home Medications Prior to Admission medications   Medication Sig Start Date End Date Taking? Authorizing Provider  albuterol (PROVENTIL HFA;VENTOLIN HFA) 108 (90 Base) MCG/ACT inhaler Inhale 2 puffs into the lungs every 6 (six) hours as needed for wheezing or shortness of breath.    Yes [provider]  Calcium-Vitamin D-Vitamin K (VIACTIV CALCIUM PLUS D) 650-12.5-40 MG-MCG-MCG CHEW Chew 1 each by mouth daily.   Yes [provider]  clobetasol ointment (TEMOVATE) 0.05 % Apply 1 application topically 2 (two) times daily. 04/24/20 04/24/21 Yes [provider]  colchicine 0.6 MG tablet Take by mouth. 10/18/20  Yes [provider]  desonide (DESOWEN) 0.05 % ointment Apply 1 application topically 2 (two) times daily. 04/24/20  Yes [provider]  fludrocortisone (FLORINEF) 0.1 MG tablet Take 100 mcg by mouth daily. 11/25/19  Yes [provider]  folic acid  (FOLVITE) 1 MG tablet Take 1 mg by mouth daily. 03/09/20  Yes [provider]  hydroxychloroquine (PLAQUENIL) 200 MG tablet Take 200 mg by mouth 2 (two) times daily. 09/10/20  Yes [provider]  ibuprofen (ADVIL) 200 MG tablet Take 800 mg by mouth every 6 (six) hours as needed for mild pain or moderate pain.   Yes [provider]  levothyroxine (SYNTHROID) 75 MCG tablet Take 75 mcg by mouth daily before breakfast.    Yes [provider]  lidocaine (LIDODERM) 5 %  Place onto the skin. 10/18/20 11/17/20 Yes [provider]  Liraglutide -Weight Management (SAXENDA) 18 MG/3ML SOPN Inject 0.6 mLs into the skin daily. 10/12/20  Yes [provider]  methotrexate 250 MG/10ML injection INJECT 0.6ML (15MG ) S.Q. ONCE A WEEK. 08/18/20  Yes [provider]  metoCLOPramide (REGLAN) 5 MG tablet Take 5 mg by mouth 2 (two) times daily. 04/12/20  Yes [provider]  Multiple Vitamin (MULTIVITAMIN WITH MINERALS) TABS tablet Take 1 tablet by mouth daily.   Yes [provider]  OnabotulinumtoxinA (BOTOX IJ) Inject 1 Dose as directed every 30 (thirty) days.    Yes [provider]  ondansetron (ZOFRAN ODT) 4 MG disintegrating tablet Take 1 tablet (4 mg total) by mouth every 8 (eight) hours as needed for nausea or vomiting. 03/11/20  Yes Triplett, Tammy, PA-C  ondansetron (ZOFRAN) 4 MG tablet Take 4 mg by mouth 3 (three) times daily as needed. 10/14/19  Yes [provider]  phentermine (ADIPEX-P) 37.5 MG tablet Take 37.5 mg by mouth daily before breakfast.  09/22/20  Yes [provider]  pregabalin (LYRICA) 50 MG capsule Take 50 mg by mouth 2 (two) times daily. 10/16/20  Yes [provider]  Rimegepant Sulfate 75 MG TBDP Take 1 tablet by mouth daily as needed.  04/12/20  Yes [provider]  valACYclovir (VALTREX) 1000 MG tablet Take 1 tablet (1,000 mg total) by mouth 3 (three) times daily. Patient not  taking: Reported on 10/24/2020 08/18/20   08/20/20, MD    Allergies    Latex  Review of Systems   Review of Systems  Constitutional: Negative for chills and fever.  HENT: Negative for congestion, tinnitus, trouble swallowing and voice change.   Eyes: Negative for visual disturbance.  Respiratory: Positive for shortness of breath.   Cardiovascular: Positive for palpitations. Negative for chest pain and leg swelling.  Gastrointestinal: Negative for abdominal pain, diarrhea, nausea and vomiting.  Genitourinary: Negative for enuresis and flank pain.  Musculoskeletal: Negative for back pain.  Skin: Negative for rash.  Neurological: Negative for dizziness and light-headedness.  Hematological: Does not bruise/bleed easily.    Physical Exam Updated Vital Signs BP (!) 139/112   Pulse (!) 118   Temp 98.1 F (36.7 C) (Oral)   Resp 17   Ht 5\' 3"  (1.6 m)   Wt 74.8 kg   LMP 10/23/2016 (Approximate)   SpO2 99%   BMI 29.23 kg/m   Physical Exam Vitals and nursing note reviewed.  Constitutional:      General: She is not in acute distress.    Appearance: She is not ill-appearing.  HENT:     Head: Normocephalic and atraumatic.     Nose: No congestion.     Mouth/Throat:     Mouth: Mucous membranes are moist.     Pharynx: Oropharynx is clear.  Eyes:     General: No scleral icterus. Neck:     Comments: Throat was palpated, nontender to palpation, no enlarged thyroid noted, no nodules or other abnormalities noted. Cardiovascular:     Rate and Rhythm: Regular rhythm. Tachycardia present.     Pulses: Normal pulses.     Heart sounds: No murmur heard.  No friction rub. No gallop.   Pulmonary:     Effort: No respiratory distress.     Breath sounds: No wheezing, rhonchi or rales.  Abdominal:     General: There is no distension.     Tenderness: There is no abdominal tenderness. There is no  right CVA tenderness, left CVA tenderness or guarding.  Musculoskeletal:        General: No  swelling.     Cervical back: Normal range of motion and neck supple. No tenderness.     Right lower leg: No edema.     Left lower leg: No edema.  Lymphadenopathy:     Cervical: No cervical adenopathy.  Skin:    General: Skin is warm and dry.     Findings: No rash.  Neurological:     Mental Status: She is alert.  Psychiatric:        Mood and Affect: Mood normal.     ED Results / Procedures / Treatments   Labs (all labs ordered are listed, but only abnormal results are displayed) Labs Reviewed  COMPREHENSIVE METABOLIC PANEL - Abnormal; Notable for the following components:      Result Value   Sodium 134 (*)    AST 12 (*)    All other components within normal limits  D-DIMER, QUANTITATIVE (NOT AT [facility]) - Abnormal; Notable for the following components:   D-Dimer, Quant 0.74 (*)    All other components within normal limits  RESP PANEL BY RT PCR (RSV, FLU A&B, COVID)  CBC WITH DIFFERENTIAL/PLATELET  TROPONIN I (HIGH SENSITIVITY)  TROPONIN I (HIGH SENSITIVITY)    EKG EKG Interpretation  Date/Time:  Tuesday October 24 2020 11:48:08 EDT Ventricular Rate:  126 PR Interval:    QRS Duration: 82 QT Interval:  414 QTC Calculation: 599 R Axis:   67 Text Interpretation: Sinus tachycardia Otherwise normal ECG QTc is long Confirmed by Jacalyn Lefevre [phone]) on 10/24/2020 3:41:10 PM   Radiology DG Chest 2 View  Result Date: 10/24/2020 CLINICAL DATA:  Shortness of breath. EXAM: CHEST - 2 VIEW COMPARISON:  February 29, 2020. FINDINGS: The heart size and mediastinal contours are within normal limits. Both lungs are clear. No pneumothorax or pleural effusion is noted. The visualized skeletal structures are unremarkable. IMPRESSION: No active cardiopulmonary disease. Electronically Signed   By: Lupita Raider M.D.   On: 10/24/2020 13:51   CT Angio Chest PE W and/or Wo Contrast  Result Date: 10/24/2020 CLINICAL DATA:  Elevated D-dimer.  Chest pain. EXAM: CT ANGIOGRAPHY CHEST WITH CONTRAST  TECHNIQUE: Multidetector CT imaging of the chest was performed using the standard protocol during bolus administration of intravenous contrast. Multiplanar CT image reconstructions and MIPs were obtained to evaluate the vascular anatomy. CONTRAST:  OMNIPAQUE IOHEXOL 350 MG/ML SOLN COMPARISON:  Radiograph same day FINDINGS: Cardiovascular: No filling defects within the pulmonary arteries to suggest acute pulmonary embolism. No significant vascular findings. Normal heart size. No pericardial effusion. Mediastinum/Nodes: No axillary or supraclavicular adenopathy. No mediastinal or hilar adenopathy. No pericardial fluid. Esophagus normal. Lungs/Pleura: No pulmonary infarction. No pneumonia. Airways normal. Upper Abdomen: Limited view of the liver, kidneys, pancreas are unremarkable. Normal adrenal glands. Musculoskeletal: No aggressive osseous lesion. Review of the MIP images confirms the above findings. IMPRESSION: 1. No evidence acute pulmonary embolism. 2. No pulmonary infarction or pneumonia. 3. No acute cardiopulmonary findings Electronically Signed   By: Genevive Bi M.D.   On: 10/24/2020 17:26    Procedures Procedures (including critical care time)  Medications Ordered in ED Medications  sodium chloride 0.9 % bolus 1,000 mL (1,000 mLs Intravenous New Bag/Given 10/24/20 1648)  iohexol (OMNIPAQUE) 350 MG/ML injection 100 mL (100 mLs Intravenous Contrast Given 10/24/20 1702)    ED Course  I have reviewed the triage vital signs and the nursing notes.  Pertinent labs & imaging results that were available during my care of the patient were reviewed by me and considered in my medical decision making (see chart for details).    MDM Rules/Calculators/A&P                          Patient presents with worsening shortness of breath.  She is alert, did not appear in acute distress, vital signs significant for tachycardia.  Will order chest pain work-up, add on a D-dimer, chest x-ray, EKG patient  after further evaluation.  CBC negative for leukocytosis or signs of anemia.  CMP showing slight hyponatremia of 134, no metabolic acidosis, no AKI, no elevation in liver enzymes, no anion gap noted.  Negative delta troponin.  D-dimer 0.74.  Respiratory panel negative for Covid, influenza A/B, RSV.  Will send patient for CT angio for further evaluation management.  CT angio chest did not reveal any acute findings. Chest x-ray does not reveal any acute findings.  EKG shows sinus tach without signs of ischemia no ST elevation or depression noted.  I have low suspicion for ACS as history is atypical, patient has no cardiac history, EKG was sinus rhythm without signs of ischemia, patient had negative delta troponin.  Low suspicion for PE as patient denies pleuritic chest pain, patient denies leg pain, no pedal edema noted on exam patient had a negative CT angio chest.  Low suspicion for AAA or aortic dissection as history is atypical, patient has low risk factors.  Low suspicion for hyper thyroidism as patient had no change in levothyroxine, last TSH was 3.5 on 09/24, no large thyroid noted on exam.  Low suspicion for systemic infection as patient is nontoxic-appearing, vital signs reassuring, no obvious source infection noted on exam no leukocytosis seen on CBC, negative respiratory panel, no signs of infection noted on her CT angiogram.  I suspect tachycardia may be secondary to dehydration as it improved with fluids.  Will recommend that she follows up with her PCP for further evaluation management.  Vital signs have remained stable, no indication for hospital admission.  Patient discussed with attending and they agreed with assessment and plan.  Patient given at home care as well strict return precautions.  Patient verbalized that they understood agreed to said plan.  Patient called the hospital for concerns of prolonged QT.  She was instructed to stop taking Zofran, Lyrica, and Reglan instructed to  follow-up with her PCP for med rec and reevaluation.     Final Clinical Impression(s) / ED Diagnoses Final diagnoses:  Shortness of breath    Rx / DC Orders ED Discharge Orders    None       Carroll Sage, PA-C 10/24/20 1800    Carroll Sage, PA-C 10/25/20 1133    Blane Ohara, MD 10/25/20 1410

## 2020-11-07 ENCOUNTER — Ambulatory Visit: Payer: BLUE CROSS/BLUE SHIELD | Admitting: Obstetrics & Gynecology

## 2020-11-09 ENCOUNTER — Other Ambulatory Visit: Payer: Self-pay

## 2020-11-09 ENCOUNTER — Ambulatory Visit (INDEPENDENT_AMBULATORY_CARE_PROVIDER_SITE_OTHER): Payer: BLUE CROSS/BLUE SHIELD | Admitting: Obstetrics & Gynecology

## 2020-11-09 VITALS — BP 122/85 | HR 109 | Ht 63.0 in | Wt 173.0 lb

## 2020-11-09 DIAGNOSIS — R399 Unspecified symptoms and signs involving the genitourinary system: Secondary | ICD-10-CM | POA: Diagnosis not present

## 2020-11-09 LAB — POCT URINALYSIS DIPSTICK OB
Blood, UA: NEGATIVE
Glucose, UA: NEGATIVE
Ketones, UA: NEGATIVE
Leukocytes, UA: NEGATIVE
Nitrite, UA: NEGATIVE
POC,PROTEIN,UA: NEGATIVE

## 2020-11-09 MED ORDER — NITROFURANTOIN MONOHYD MACRO 100 MG PO CAPS: ORAL_CAPSULE | ORAL | 3 refills | Status: DC

## 2020-11-09 NOTE — Progress Notes (Signed)
Chief Complaint  Patient presents with  . Follow-up    Bladder irrigation      34 y.o. [id number] Patient's last menstrual period was 10/23/2016 (approximate). The current method of family planning is status post hysterectomy.  Outpatient Encounter Medications as of 11/09/2020  Medication Sig Note  . albuterol (PROVENTIL HFA;VENTOLIN HFA) 108 (90 Base) MCG/ACT inhaler Inhale 2 puffs into the lungs every 6 (six) hours as needed for wheezing or shortness of breath.    . Calcium-Vitamin D-Vitamin K (VIACTIV CALCIUM PLUS D) 650-12.5-40 MG-MCG-MCG CHEW Chew 1 each by mouth daily.   . clobetasol ointment (TEMOVATE) 0.05 % Apply 1 application topically 2 (two) times daily.   Marland Kitchen desonide (DESOWEN) 0.05 % ointment Apply 1 application topically 2 (two) times daily.   . fludrocortisone (FLORINEF) 0.1 MG tablet Take 100 mcg by mouth daily.   . folic acid (FOLVITE) 1 MG tablet Take 1 mg by mouth daily.   Marland Kitchen ibuprofen (ADVIL) 200 MG tablet Take 800 mg by mouth every 6 (six) hours as needed for mild pain or moderate pain.   Marland Kitchen levothyroxine (SYNTHROID) 75 MCG tablet Take 75 mcg by mouth daily before breakfast.    . lidocaine (LIDODERM) 5 % Place onto the skin.   . Liraglutide -Weight Management (SAXENDA) 18 MG/3ML SOPN Inject 0.6 mLs into the skin daily.   . metoCLOPramide (REGLAN) 5 MG tablet Take 5 mg by mouth 2 (two) times daily.   . Multiple Vitamin (MULTIVITAMIN WITH MINERALS) TABS tablet Take 1 tablet by mouth daily.   . OnabotulinumtoxinA (BOTOX IJ) Inject 1 Dose as directed every 30 (thirty) days.    . phentermine (ADIPEX-P) 37.5 MG tablet Take 37.5 mg by mouth daily before breakfast.    . pregabalin (LYRICA) 50 MG capsule Take 50 mg by mouth 2 (two) times daily.   . Rimegepant Sulfate 75 MG TBDP Take 1 tablet by mouth daily as needed.    . nitrofurantoin, macrocrystal-monohydrate, (MACROBID) 100 MG capsule Take 1 po as directed   . [DISCONTINUED] colchicine 0.6 MG tablet Take by mouth.  10/24/2020: Pt has not started medication yet.  . [DISCONTINUED] hydroxychloroquine (PLAQUENIL) 200 MG tablet Take 200 mg by mouth 2 (two) times daily.   . [DISCONTINUED] methotrexate 250 MG/10ML injection INJECT 0.6ML (15MG ) S.Q. ONCE A WEEK.   . [DISCONTINUED] ondansetron (ZOFRAN ODT) 4 MG disintegrating tablet Take 1 tablet (4 mg total) by mouth every 8 (eight) hours as needed for nausea or vomiting.   . [DISCONTINUED] ondansetron (ZOFRAN) 4 MG tablet Take 4 mg by mouth 3 (three) times daily as needed. 03/11/2020: Patient attempted to take two doses on 03/11/2020 but immediately got sick and vomited medication  . [DISCONTINUED] valACYclovir (VALTREX) 1000 MG tablet Take 1 tablet (1,000 mg total) by mouth 3 (three) times daily. (Patient not taking: Reported on 10/24/2020)    No facility-administered encounter medications on file as of 11/09/2020.    Subjective Chief complaint is really not with the patient is here for today Were going to put a hold on any DMSO irrigations at this point Her bladder symptoms continue but may be not quite interstitial cystitis She certainly has episodic urinary tract infections because of intermittent catheterization she has to perform She feels like she is not emptying her bladder at times as well She is also complaining of neuropathic pain I believe Which is consistent with her overall autoimmune state I think she is also depressed and we discussed the possibility of adding  Cymbalta to her regimen which may have dual effects For the most part we sat and talked about her ongoing issues and hopefully that was of help Her autoimmune issues include Hashimoto's rheumatoid arthritis lupus picture with some neurological issues as well but not specifically diagnosis multiple sclerosis Her coxsackie pharyngitis improved greatly on Valtrex and has resolved Past Medical History:  Diagnosis Date  . Allergic rhinitis   . Anemia   . Arthritis   . Asthma    last used  inhaler 1 month ago  . Dysphagia   . Dysrhythmia    SVT  . Female bladder prolapse   . GERD (gastroesophageal reflux disease)    with pregnancy only  . Lupus (HCC)   . Migraines   . Pelvic pain in antepartum period in second trimester 01/17/2014  . Pregnant 08/01/2015  . PSVT (paroxysmal supraventricular tachycardia) (HCC)    could not be confirmed with 2016 study  . Rectocele   . Thyroid disease   . Urethral prolapse   . Uterine prolapse   . Vaginal Pap smear, abnormal     Past Surgical History:  Procedure Laterality Date  . ABDOMINAL HYSTERECTOMY    . ANTERIOR AND POSTERIOR VAGINAL REPAIR    . CYSTOCELE REPAIR N/A 09/17/2017   Procedure: ANTERIOR REPAIR (CYSTOCELE);  Surgeon: Lazaro Arms, MD;  Location: [facility];  Service: Gynecology;  Laterality: N/A;  . SUPRAVENTRICULAR TACHYCARDIA ABLATION  01/05/2015   unsuccessful  . SUPRAVENTRICULAR TACHYCARDIA ABLATION N/A 01/05/2015   Procedure: SUPRAVENTRICULAR TACHYCARDIA ABLATION;  Surgeon: Marinus Maw, MD;  Location: [facility] CATH [facility];  Service: Cardiovascular;  Laterality: N/A;  . VAGINAL HYSTERECTOMY N/A 09/17/2017   Procedure: HYSTERECTOMY VAGINAL WITH POSSIBLE BILATERAL SALPINGECTOMY;  Surgeon: Lazaro Arms, MD;  Location: [facility];  Service: Gynecology;  Laterality: N/A;    OB History    Gravida  6   Para  6   Term  6   Preterm      AB      Living  6     SAB      TAB      Ectopic      Multiple  0   Live Births  6           Allergies  Allergen Reactions  . Plaquenil [Hydroxychloroquine] Other (See Comments)    Prolonged QT  . Latex Dermatitis    Social History   Socioeconomic History  . Marital status: Married    Spouse name: Not on file  . Number of children: 6  . Years of education: HS  . Highest education level: Not on file  Occupational History  . Occupation: Web designer  Tobacco Use  . Smoking status: Never Smoker  . Smokeless tobacco: Never Used  Vaping Use  .  Vaping Use: Never used  Substance and Sexual Activity  . Alcohol use: No  . Drug use: No  . Sexual activity: Yes    Birth control/protection: Surgical    Comment: hyst  Other Topics Concern  . Not on file  Social History Narrative   Lives with husband   Right handed    Caffeine use: 1-2 diet cokes per day   Social Determinants of Health   Financial Resource Strain:   . Difficulty of Paying Living Expenses: Not on file  Food Insecurity:   . Worried About Programme researcher, broadcasting/film/video in the Last Year: Not on file  . Ran Out of Food in the Last Year: Not  on file  Transportation Needs:   . Lack of Transportation (Medical): Not on file  . Lack of Transportation (Non-Medical): Not on file  Physical Activity:   . Days of Exercise per Week: Not on file  . Minutes of Exercise per Session: Not on file  Stress:   . Feeling of Stress : Not on file  Social Connections:   . Frequency of Communication with Friends and Family: Not on file  . Frequency of Social Gatherings with Friends and Family: Not on file  . Attends Religious Services: Not on file  . Active Member of Clubs or Organizations: Not on file  . Attends Banker Meetings: Not on file  . Marital Status: Not on file    Family History  Problem Relation Age of Onset  . Hypertension Mother   . Hyperlipidemia Mother   . Multiple sclerosis Mother   . COPD Mother   . Cancer Mother        melanoma x 2  . Diabetes Mother   . Heart disease Father        PSVT  . Hyperlipidemia Brother   . Hypertension Brother   . Heart disease Maternal Grandfather        heart attack  . Cancer Paternal Grandmother        pancreatic cancer  . Heart disease Paternal Grandmother        MI  . Hyperlipidemia Paternal Grandmother   . Hypertension Paternal Grandmother   . Asthma Daughter   . Asthma Son   . Autism Son   . ADD / ADHD Son   . Tourette syndrome Son   . Other Son        Neurological and mental issues  . ADD / ADHD Son      Medications:       Current Outpatient Medications:  .  albuterol (PROVENTIL HFA;VENTOLIN HFA) 108 (90 Base) MCG/ACT inhaler, Inhale 2 puffs into the lungs every 6 (six) hours as needed for wheezing or shortness of breath. , Disp: , Rfl:  .  Calcium-Vitamin D-Vitamin K (VIACTIV CALCIUM PLUS D) 650-12.5-40 MG-MCG-MCG CHEW, Chew 1 each by mouth daily., Disp: , Rfl:  .  clobetasol ointment (TEMOVATE) 0.05 %, Apply 1 application topically 2 (two) times daily., Disp: , Rfl:  .  desonide (DESOWEN) 0.05 % ointment, Apply 1 application topically 2 (two) times daily., Disp: , Rfl:  .  fludrocortisone (FLORINEF) 0.1 MG tablet, Take 100 mcg by mouth daily., Disp: , Rfl:  .  folic acid (FOLVITE) 1 MG tablet, Take 1 mg by mouth daily., Disp: , Rfl:  .  ibuprofen (ADVIL) 200 MG tablet, Take 800 mg by mouth every 6 (six) hours as needed for mild pain or moderate pain., Disp: , Rfl:  .  levothyroxine (SYNTHROID) 75 MCG tablet, Take 75 mcg by mouth daily before breakfast. , Disp: , Rfl:  .  lidocaine (LIDODERM) 5 %, Place onto the skin., Disp: , Rfl:  .  Liraglutide -Weight Management (SAXENDA) 18 MG/3ML SOPN, Inject 0.6 mLs into the skin daily., Disp: , Rfl:  .  metoCLOPramide (REGLAN) 5 MG tablet, Take 5 mg by mouth 2 (two) times daily., Disp: , Rfl:  .  Multiple Vitamin (MULTIVITAMIN WITH MINERALS) TABS tablet, Take 1 tablet by mouth daily., Disp: , Rfl:  .  OnabotulinumtoxinA (BOTOX IJ), Inject 1 Dose as directed every 30 (thirty) days. , Disp: , Rfl:  .  phentermine (ADIPEX-P) 37.5 MG tablet, Take 37.5 mg by mouth  daily before breakfast. , Disp: , Rfl:  .  pregabalin (LYRICA) 50 MG capsule, Take 50 mg by mouth 2 (two) times daily., Disp: , Rfl:  .  Rimegepant Sulfate 75 MG TBDP, Take 1 tablet by mouth daily as needed. , Disp: , Rfl:  .  nitrofurantoin, macrocrystal-monohydrate, (MACROBID) 100 MG capsule, Take 1 po as directed, Disp: 30 capsule, Rfl: 3  Objective Blood pressure 122/85, pulse (!) 109,  height 5\' 3"  (1.6 m), weight 173 lb (78.5 kg), last menstrual period 10/23/2016.  Well-developed well-nourished no acute distress 79 kg  Pertinent ROS  No nausea, vomiting or diarrhea Nor fever chills or other constitutional symptoms   Labs or studies     Impression Diagnoses this Encounter::   ICD-10-CM   1. Urinary tract infection symptoms  R39.9 POC Urinalysis Dipstick OB    Urine Culture    Established relevant diagnosis(es):   Plan/Recommendations: Meds ordered this encounter  Medications  . nitrofurantoin, macrocrystal-monohydrate, (MACROBID) 100 MG capsule    Sig: Take 1 po as directed    Dispense:  30 capsule    Refill:  3    Labs or Scans Ordered: Orders Placed This Encounter  Procedures  . Urine Culture  . POC Urinalysis Dipstick OB    Management:: I believe it may be helpful to put on daily suppression with Macrodantin we will start off with Macrobid bid nightly 800 mg and if were successful try to titrate back to Macrodantin 50 mg nightly  I brought up the possibility of using Cymbalta which I think may have dual effect for her and she seems open to that she would discuss it with her rheumatologist  I look forward to seeing Ezrah back as needed going forward and will answer any question she has through my chart in the meantime  Follow up No follow-ups on file.        Face to face time:  15 minutes  Greater than 50% of the visit time was spent in counseling and coordination of care with the patient.  The summary and outline of the counseling and care coordination is summarized in the note above.   All questions were answered.

## 2020-11-12 ENCOUNTER — Emergency Department (HOSPITAL_COMMUNITY)
Admission: EM | Admit: 2020-11-12 | Discharge: 2020-11-12 | Disposition: A | Discharge status: Home / Self Care | Payer: BLUE CROSS/BLUE SHIELD | Attending: Emergency Medicine | Admitting: Emergency Medicine

## 2020-11-12 ENCOUNTER — Encounter (HOSPITAL_COMMUNITY): Payer: Self-pay | Admitting: Emergency Medicine

## 2020-11-12 ENCOUNTER — Other Ambulatory Visit: Payer: Self-pay

## 2020-11-12 DIAGNOSIS — Z91040 Latex allergy status: Secondary | ICD-10-CM | POA: Diagnosis not present

## 2020-11-12 DIAGNOSIS — G43809 Other migraine, not intractable, without status migrainosus: Secondary | ICD-10-CM | POA: Diagnosis not present

## 2020-11-12 DIAGNOSIS — R519 Headache, unspecified: Secondary | ICD-10-CM | POA: Diagnosis present

## 2020-11-12 DIAGNOSIS — J45909 Unspecified asthma, uncomplicated: Secondary | ICD-10-CM | POA: Insufficient documentation

## 2020-11-12 MED ORDER — DEXAMETHASONE SODIUM PHOSPHATE 10 MG/ML IJ SOLN: 10.0000 mg | Freq: Once | INTRAMUSCULAR | Status: DC

## 2020-11-12 MED ORDER — KETOROLAC TROMETHAMINE 30 MG/ML IJ SOLN
15.0000 mg | Freq: Once | INTRAMUSCULAR | Status: AC
  Administered 2020-11-12: 15 mg via INTRAVENOUS
  Filled 2020-11-12: qty 1

## 2020-11-12 MED ORDER — DEXAMETHASONE SODIUM PHOSPHATE 10 MG/ML IJ SOLN
10.0000 mg | Freq: Once | INTRAMUSCULAR | Status: AC
  Administered 2020-11-12: 10 mg via INTRAVENOUS
  Filled 2020-11-12: qty 1

## 2020-11-12 MED ORDER — SODIUM CHLORIDE 0.9 % IV BOLUS
500.0000 mL | Freq: Once | INTRAVENOUS | Status: AC
  Administered 2020-11-12: 500 mL via INTRAVENOUS

## 2020-11-12 MED ORDER — DIPHENHYDRAMINE HCL 50 MG/ML IJ SOLN
12.5000 mg | Freq: Once | INTRAMUSCULAR | Status: AC
  Administered 2020-11-12: 12.5 mg via INTRAVENOUS
  Filled 2020-11-12: qty 1

## 2020-11-12 MED ORDER — METOCLOPRAMIDE HCL 5 MG/ML IJ SOLN
10.0000 mg | Freq: Once | INTRAMUSCULAR | Status: AC
  Administered 2020-11-12: 10 mg via INTRAVENOUS
  Filled 2020-11-12: qty 2

## 2020-11-12 NOTE — ED Notes (Signed)
Pt reports pain relief  4/10  Her IV has been decreased due to both size and pt bending arm   Warm blanket given

## 2020-11-12 NOTE — ED Notes (Signed)
Dr Effie Shy notified about patient's facial droop and LKW.

## 2020-11-12 NOTE — ED Notes (Signed)
Pt reports she cannot raise her eyebrows due to botox injections   She request reglan currently

## 2020-11-12 NOTE — ED Notes (Signed)
Pt reports migraines daily  Is followed at [facility] as well [facility]  Her hx of trigeminal-ocular migraine involvement   Recently medicine for migraine prevention because insurance declined to pay and reports med isn't working   States she has appt with [facility] as well as the [facility]o clinic (Monday) but is going to called her neuro at [facility] to ask to be seen Monday to renew med that was discontinued  Dr Effie Shy in to assess

## 2020-11-12 NOTE — ED Notes (Signed)
Pt reports she is also referred to [facility]  She has normal MRI, but positive nerve conduction study per her report  And has been followed there and they referred her to the headache clinic

## 2020-11-12 NOTE — ED Provider Notes (Signed)
[facility] EMERGENCY DEPARTMENT Provider Note   CSN: [id number] Arrival date & time: 11/12/20  1307     History Chief Complaint  Patient presents with  . headache with complication    Sharon Rowland is a 34 y.o. female.  HPI Patient complains of persistent headache for 1 month, despite using usual medications, and that her headache worsened upon awaking this morning.  She feels like she has droopiness of the right side of her face.  Her headache is mostly right-sided but is also present posteriorly and radiates to the forehead, bilaterally.  She has mild blurred vision in the right eye.  She denies double vision.  She denies fever, chills, difficulty walking, persistent nausea and vomiting, neck or back pain.  She saw her neurologist about a week ago who is working with her on trying to get a medication authorized to be paid for by her insurance.  She also saw her cardiologist recently for "prolonged QT syndrome."  This is a typical migraine for this patient.  She is tolerating her usual medications, but is not getting relief from it.  There are no other known modifying factors.    Past Medical History:  Diagnosis Date  . Allergic rhinitis   . Anemia   . Arthritis   . Asthma    last used inhaler 1 month ago  . Dysphagia   . Dysrhythmia    SVT  . Female bladder prolapse   . GERD (gastroesophageal reflux disease)    with pregnancy only  . Lupus (HCC)   . Migraines   . Pelvic pain in antepartum period in second trimester 01/17/2014  . Pregnant 08/01/2015  . PSVT (paroxysmal supraventricular tachycardia) (HCC)    could not be confirmed with 2016 study  . Rectocele   . Thyroid disease   . Urethral prolapse   . Uterine prolapse   . Vaginal Pap smear, abnormal     Patient Active Problem List   Diagnosis Date Noted  . Fall 03/09/2019  . Urinary retention 03/04/2019  . Numbness 03/04/2019  . Lhermitte's sign positive 03/04/2019  . Dysphagia 03/04/2019  . Acute  nasopharyngitis 11/25/2018  . Hematuria 11/25/2018  . Burning with urination 11/25/2018  . S/P vaginal hysterectomy 09/17/2017  . Normal labor 08/10/2017  . Uterine/bladder prolapse 02/13/2017  . History of recurrent UTI (urinary tract infection) 02/27/2016  . Migraines 10/03/2015  . Supervision of normal pregnancy 09/05/2015  . Trigger point of shoulder region 06/20/2015  . Rh negative state in antepartum period 01/31/2014  . Abnormal Pap smear of cervix 01/31/2014  . Paroxysmal SVT (supraventricular tachycardia) (HCC) 11/09/2013  . Asthma, stable 11/09/2013    Past Surgical History:  Procedure Laterality Date  . ABDOMINAL HYSTERECTOMY    . ANTERIOR AND POSTERIOR VAGINAL REPAIR    . CYSTOCELE REPAIR N/A 09/17/2017   Procedure: ANTERIOR REPAIR (CYSTOCELE);  Surgeon: Lazaro Arms, MD;  Location: [facility];  Service: Gynecology;  Laterality: N/A;  . SUPRAVENTRICULAR TACHYCARDIA ABLATION  01/05/2015   unsuccessful  . SUPRAVENTRICULAR TACHYCARDIA ABLATION N/A 01/05/2015   Procedure: SUPRAVENTRICULAR TACHYCARDIA ABLATION;  Surgeon: Marinus Maw, MD;  Location: [facility] CATH LAB;  Service: Cardiovascular;  Laterality: N/A;  . VAGINAL HYSTERECTOMY N/A 09/17/2017   Procedure: HYSTERECTOMY VAGINAL WITH POSSIBLE BILATERAL SALPINGECTOMY;  Surgeon: Lazaro Arms, MD;  Location: [facility];  Service: Gynecology;  Laterality: N/A;     OB History    Gravida  6   Para  6   Term  6  Preterm      AB      Living  6     SAB      TAB      Ectopic      Multiple  0   Live Births  6           Family History  Problem Relation Age of Onset  . Hypertension Mother   . Hyperlipidemia Mother   . Multiple sclerosis Mother   . COPD Mother   . Cancer Mother        melanoma x 2  . Diabetes Mother   . Heart disease Father        PSVT  . Hyperlipidemia Brother   . Hypertension Brother   . Heart disease Maternal Grandfather        heart attack  . Cancer Paternal Grandmother         pancreatic cancer  . Heart disease Paternal Grandmother        MI  . Hyperlipidemia Paternal Grandmother   . Hypertension Paternal Grandmother   . Asthma Daughter   . Asthma Son   . Autism Son   . ADD / ADHD Son   . Tourette syndrome Son   . Other Son        Neurological and mental issues  . ADD / ADHD Son     Social History   Tobacco Use  . Smoking status: Never Smoker  . Smokeless tobacco: Never Used  Vaping Use  . Vaping Use: Never used  Substance Use Topics  . Alcohol use: No  . Drug use: No    Home Medications Prior to Admission medications   Medication Sig Start Date End Date Taking? Authorizing Provider  albuterol (PROVENTIL HFA;VENTOLIN HFA) 108 (90 Base) MCG/ACT inhaler Inhale 2 puffs into the lungs every 6 (six) hours as needed for wheezing or shortness of breath.    Yes [provider]  Calcium-Vitamin D-Vitamin K (VIACTIV CALCIUM PLUS D) 650-12.5-40 MG-MCG-MCG CHEW Chew 1 each by mouth daily.   Yes [provider]  clobetasol ointment (TEMOVATE) 0.05 % Apply 1 application topically 2 (two) times daily. 04/24/20 04/24/21 Yes [provider]  desonide (DESOWEN) 0.05 % ointment Apply 1 application topically 2 (two) times daily. 04/24/20  Yes [provider]  fludrocortisone (FLORINEF) 0.1 MG tablet Take 100 mcg by mouth daily. 11/25/19  Yes [provider]  folic acid (FOLVITE) 1 MG tablet Take 1 mg by mouth daily. 03/09/20  Yes [provider]  ibuprofen (ADVIL) 200 MG tablet Take 800 mg by mouth every 6 (six) hours as needed for mild pain or moderate pain.   Yes [provider]  levothyroxine (SYNTHROID) 75 MCG tablet Take 75 mcg by mouth daily before breakfast.    Yes [provider]  lidocaine (LIDODERM) 5 % Place onto the skin. 10/18/20 11/17/20 Yes [provider]  Liraglutide -Weight Management (SAXENDA) 18 MG/3ML SOPN Inject 0.6 mLs into the skin daily. 10/12/20  Yes [provider]  methocarbamol (ROBAXIN) 500 MG tablet Take 500 mg by mouth 2 (two) times daily. 11/04/20  Yes [provider]  metoCLOPramide (REGLAN) 5 MG tablet Take 5 mg by mouth 2 (two) times daily. 04/12/20  Yes [provider]  Multiple Vitamin (MULTIVITAMIN WITH MINERALS) TABS tablet Take 1 tablet by mouth daily.   Yes [provider]  OnabotulinumtoxinA (BOTOX IJ) Inject 1 Dose as directed every 30 (thirty) days.    Yes [provider]  phentermine (ADIPEX-P) 37.5 MG tablet Take 37.5 mg by mouth daily before breakfast.  09/22/20  Yes [provider]  pregabalin (LYRICA) 25 MG capsule  11/08/20  Yes [provider]  pregabalin (LYRICA) 50 MG capsule Take 50 mg by mouth 2 (two) times daily. 10/16/20  Yes [provider]  Rimegepant Sulfate 75 MG TBDP Take 1 tablet by mouth daily as needed.  04/12/20  Yes [provider]  nitrofurantoin, macrocrystal-monohydrate, (MACROBID) 100 MG capsule Take 1 po as directed Patient not taking: Reported on 11/12/2020 11/09/20   Lazaro Arms, MD  UBRELVY 100 MG TABS Take by mouth. Patient not taking: Reported on 11/12/2020 10/25/20   [provider]    Allergies    Plaquenil [hydroxychloroquine] and Latex  Review of Systems   Review of Systems  All other systems reviewed and are negative.   Physical Exam Updated Vital Signs BP 129/77   Pulse (!) 53   Temp 98.4 F (36.9 C) (Oral)   Resp 18   Ht 5\' 3"  (1.6 m)   Wt 78.5 kg   LMP 10/23/2016 (Approximate)   SpO2 99%   BMI 30.65 kg/m   Physical Exam Vitals and nursing note reviewed.  Constitutional:      General: She is not in acute distress.    Appearance: She is well-developed. She is not ill-appearing, toxic-appearing or diaphoretic.  HENT:     Head: Normocephalic and atraumatic.     Right Ear: External ear normal.     Left Ear: External ear normal.  Eyes:     Conjunctiva/sclera: Conjunctivae normal.     Pupils:  Pupils are equal, round, and reactive to light.  Neck:     Trachea: Phonation normal.  Cardiovascular:     Rate and Rhythm: Normal rate and regular rhythm.     Heart sounds: Normal heart sounds.  Pulmonary:     Effort: Pulmonary effort is normal.     Breath sounds: Normal breath sounds.  Abdominal:     General: There is no distension.  Musculoskeletal:        General: Normal range of motion.     Cervical back: Normal range of motion and neck supple.  Skin:    General: Skin is warm and dry.  Neurological:     Mental Status: She is alert and oriented to person, place, and time.     Cranial Nerves: No cranial nerve deficit.     Sensory: No sensory deficit.     Motor: No abnormal muscle tone.     Coordination: Coordination normal.     Comments: No facial asymmetry.  No dysarthria, aphasia, nystagmus, ataxia.  Psychiatric:        Mood and Affect: Mood normal.        Behavior: Behavior normal.        Thought Content: Thought content normal.        Judgment: Judgment normal.     ED Results / Procedures / Treatments   Labs (all labs ordered are listed, but only abnormal results are displayed) Labs Reviewed - No data to display  EKG EKG Interpretation  Date/Time:  Sunday November 12 2020 13:40:09 EST Ventricular Rate:  96 PR Interval:    QRS Duration: 89 QT Interval:  375 QTC Calculation: 474 R Axis:   50 Text Interpretation: Sinus rhythm Low voltage, precordial leads Borderline T abnormalities, diffuse leads Baseline wander in lead(s) II Since last tracing QT has shortened and rate slower Otherwise no  significant change Confirmed by Mancel Bale [phone]) on 11/12/2020 1:43:36 PM   Radiology No results found.  Procedures Procedures (including critical care time)  Medications Ordered in ED Medications  dexamethasone (DECADRON) injection 10 mg (has no administration in time range)  sodium chloride 0.9 % bolus 500 mL (500 mLs Intravenous New Bag/Given 11/12/20 1406)    metoCLOPramide (REGLAN) injection 10 mg (10 mg Intravenous Given 11/12/20 1407)  ketorolac (TORADOL) 30 MG/ML injection 15 mg (15 mg Intravenous Given 11/12/20 1410)  diphenhydrAMINE (BENADRYL) injection 12.5 mg (12.5 mg Intravenous Given 11/12/20 1411)    ED Course  I have reviewed the triage vital signs and the nursing notes.  Pertinent labs & imaging results that were available during my care of the patient were reviewed by me and considered in my medical decision making (see chart for details).    MDM Rules/Calculators/A&P                           Patient Vitals for the past 24 hrs:  BP Temp Temp src Pulse Resp SpO2 Height Weight  11/12/20 1507 -- 98.4 F (36.9 C) Oral -- -- -- -- --  11/12/20 1500 129/77 -- -- (!) 53 18 99 % -- --  11/12/20 1430 112/71 -- -- 70 20 100 % -- --  11/12/20 1400 117/80 -- -- 80 (!) 21 100 % -- --  11/12/20 1331 -- -- -- -- -- -- 5\' 3"  (1.6 m) 78.5 kg  11/12/20 1330 (!) 155/89 98.4 F (36.9 C) Oral (!) 118 16 100 % -- --    3:36 PM Reevaluation with update and discussion. After initial assessment and treatment, an updated evaluation reveals she is feeling better and states she would like a dose of steroids, prior to discharge, to prevent headache from returning.  I ordered Decadron.  Findings discussed and questions answered. Mancel Bale   Medical Decision Making:  This patient is presenting for evaluation of headache, which does require a range of treatment options, and is a complaint that involves a moderate risk of morbidity and mortality. The differential diagnoses include, headache, migraine headache, nonspecific myalgia. I decided to review old records, and in summary patient with chronic migraines, currently being followed and treated by neurology with multiple modalities.  I did not require additional historical information from anyone.    Critical Interventions-clinical evaluation, medication treatment, reevaluation  After These  Interventions, the Patient was reevaluated and was found stable for discharge.  Patient appears to be having ongoing migraine pain, and is improved after treatment, stable for discharge.  CRITICAL CARE-no Performed by: Mancel Bale  Nursing Notes Reviewed/ Care Coordinated Applicable Imaging Reviewed Interpretation of Laboratory Data incorporated into ED treatment  The patient appears reasonably screened and/or stabilized for discharge and I doubt any other medical condition or other [facility] requiring further screening, evaluation, or treatment in the ED at this time prior to discharge.  Plan: Home Medications-continue usual; Home Treatments-rest, fluids; return here if the recommended treatment, does not improve the symptoms; Recommended follow up-neurology, as needed     Final Clinical Impression(s) / ED Diagnoses Final diagnoses:  Other migraine without status migrainosus, not intractable    Rx / DC Orders ED Discharge Orders    None       Mancel Bale, MD 11/12/20 1538

## 2020-11-12 NOTE — Discharge Instructions (Signed)
Continue your usual medications.

## 2020-11-12 NOTE — ED Triage Notes (Signed)
Pt c/o a migraine that began at 0400 this morning. Patient noticed right facial droop at 1130. Pt has a hx of bells palsy and complicated migraine. Pt states that she has some weakness from the on side of her body to the other but no more than normal. Patient has taken OTC meds without relief.

## 2020-11-13 ENCOUNTER — Telehealth: Payer: Self-pay | Admitting: Obstetrics & Gynecology

## 2020-11-13 NOTE — Telephone Encounter (Signed)
1 tablet orally after intercourse(if they must know)  Thanks

## 2020-11-13 NOTE — Telephone Encounter (Signed)
[facility] pharmacy needs clarification on directions for the Macrobid ordered on 11/09/2020  Please advise & call pharmacy  [phone]

## 2020-11-14 LAB — URINE CULTURE

## 2020-11-14 NOTE — Addendum Note (Signed)
Addended by: Annamarie Dawley on: 11/14/2020 11:04 AM   Modules accepted: Orders

## 2020-11-14 NOTE — Telephone Encounter (Signed)
Spoke with [facility] [employer] and informed that patient should be taking one tablet after intercourse.

## 2020-11-18 ENCOUNTER — Encounter: Admit: 2020-11-18 | Discharge: 2020-11-19 | Payer: PRIVATE HEALTH INSURANCE

## 2020-11-18 DIAGNOSIS — N390 Urinary tract infection, site not specified: Principal | ICD-10-CM

## 2020-11-18 DIAGNOSIS — R300 Dysuria: Principal | ICD-10-CM

## 2020-11-18 DIAGNOSIS — R319 Hematuria, unspecified: Principal | ICD-10-CM

## 2020-11-18 MED ORDER — NITROFURANTOIN MONOHYDRATE/MACROCRYSTALS 100 MG CAPSULE
ORAL_CAPSULE | Freq: Two times a day (BID) | ORAL | 0 refills | 7 days | Status: CP
Start: 2020-11-18 — End: 2020-11-25

## 2020-11-20 ENCOUNTER — Telehealth: Payer: Self-pay | Admitting: Obstetrics & Gynecology

## 2020-11-20 NOTE — Telephone Encounter (Signed)
[facility] calling again to confirm that Dr. Despina Hidden wants to use Macrobid. Pharmacist states that Macrodantin is usually used in this situation. Advised I would send message to Dr. Despina Hidden to be sure.

## 2020-11-20 NOTE — Telephone Encounter (Signed)
Yes I want to use macrobid, it has both forms of nitrofurantoin in it not just the one  Thank you

## 2020-11-20 NOTE — Telephone Encounter (Signed)
[facility] called to confirm formulation of Rx  States that normally Macrobid is not ordered for UTI prevention, usually Macrobantin is ordered  Please advise & notify pt

## 2020-12-24 ENCOUNTER — Encounter: Payer: Self-pay | Admitting: Obstetrics & Gynecology

## 2020-12-25 ENCOUNTER — Encounter: Admit: 2020-12-25 | Discharge: 2020-12-25 | Payer: PRIVATE HEALTH INSURANCE

## 2020-12-25 DIAGNOSIS — Z6829 Body mass index (BMI) 29.0-29.9, adult: Principal | ICD-10-CM

## 2020-12-25 DIAGNOSIS — R682 Dry mouth, unspecified: Principal | ICD-10-CM

## 2020-12-25 DIAGNOSIS — Z8619 Personal history of other infectious and parasitic diseases: Principal | ICD-10-CM

## 2020-12-25 DIAGNOSIS — M2550 Pain in unspecified joint: Principal | ICD-10-CM

## 2020-12-25 DIAGNOSIS — L4050 Arthropathic psoriasis, unspecified: Principal | ICD-10-CM

## 2021-01-02 ENCOUNTER — Other Ambulatory Visit: Payer: Self-pay

## 2021-01-02 ENCOUNTER — Emergency Department
Admission: EM | Admit: 2021-01-02 | Discharge: 2021-01-02 | Disposition: A | Discharge status: Home / Self Care | Payer: BLUE CROSS/BLUE SHIELD | Attending: Emergency Medicine | Admitting: Emergency Medicine

## 2021-01-02 ENCOUNTER — Emergency Department: Payer: BLUE CROSS/BLUE SHIELD

## 2021-01-02 DIAGNOSIS — R0602 Shortness of breath: Secondary | ICD-10-CM | POA: Insufficient documentation

## 2021-01-02 DIAGNOSIS — T413X5A Adverse effect of local anesthetics, initial encounter: Secondary | ICD-10-CM | POA: Insufficient documentation

## 2021-01-02 DIAGNOSIS — Z91040 Latex allergy status: Secondary | ICD-10-CM | POA: Diagnosis not present

## 2021-01-02 DIAGNOSIS — J45909 Unspecified asthma, uncomplicated: Secondary | ICD-10-CM | POA: Diagnosis not present

## 2021-01-02 DIAGNOSIS — R0789 Other chest pain: Secondary | ICD-10-CM | POA: Insufficient documentation

## 2021-01-02 DIAGNOSIS — Z79899 Other long term (current) drug therapy: Secondary | ICD-10-CM | POA: Diagnosis not present

## 2021-01-02 LAB — BASIC METABOLIC PANEL
Anion gap: 12 (ref 5–15)
BUN: 8 mg/dL (ref 6–20)
CO2: 25 mmol/L (ref 22–32)
Calcium: 9.6 mg/dL (ref 8.9–10.3)
Chloride: 102 mmol/L (ref 98–111)
Creatinine, Ser: 0.75 mg/dL (ref 0.44–1.00)
GFR, Estimated: >60 mL/min (ref >60)
Glucose, Bld: 112 mg/dL — ABNORMAL HIGH (ref 70–99)
Potassium: 3.6 mmol/L (ref 3.5–5.1)
Sodium: 139 mmol/L (ref 135–145)

## 2021-01-02 LAB — CBC
HCT: 41.1 % (ref 36.0–46.0)
Hemoglobin: 14 g/dL (ref 12.0–15.0)
MCH: 28.6 pg (ref 26.0–34.0)
MCHC: 34.1 g/dL (ref 30.0–36.0)
MCV: 83.9 fL (ref 80.0–100.0)
Platelets: 257 10*3/uL (ref 150–400)
RBC: 4.9 MIL/uL (ref 3.87–5.11)
RDW: 12.7 % (ref 11.5–15.5)
WBC: 7.2 10*3/uL (ref 4.0–10.5)
nRBC: 0 % (ref 0.0–0.2)

## 2021-01-02 LAB — TROPONIN I (HIGH SENSITIVITY): Troponin I (High Sensitivity): <2 ng/L (ref <18)

## 2021-01-02 MED ORDER — SODIUM CHLORIDE 0.9 % IV BOLUS
1000.0000 mL | Freq: Once | INTRAVENOUS | Status: AC
  Administered 2021-01-02: 1000 mL via INTRAVENOUS

## 2021-01-02 MED ORDER — IOHEXOL 350 MG/ML SOLN
75.0000 mL | Freq: Once | INTRAVENOUS | Status: AC | PRN
  Administered 2021-01-02: 75 mL via INTRAVENOUS
  Filled 2021-01-02: qty 75

## 2021-01-02 NOTE — Discharge Instructions (Addendum)
Your exam, labs, and CT screen are all normal and reassuring at this time.  No indication of an acute pulmonary embolism exist.  No other cardiac or respiratory abnormalities are found.  You should follow-up with your primary provider or neurologist for ongoing symptom management.  Return to the ED if needed.

## 2021-01-02 NOTE — ED Triage Notes (Signed)
First Nurse Note:  Arrives with c/o right rib pain and SOB.  Patient states she recently had nerve injections at neurologists office and after injections her pulse went up to 250.  HR has come down now to low 100's, according to patient.  Patietn is AAOx3.  Skin warm and dry. No SOB/ DOE.  Patient states "I feel like I can't catch my breath".

## 2021-01-02 NOTE — ED Triage Notes (Addendum)
Pt comes via POV with c/o left sided rib pain. Pt states she had some nerve injections and pt states following she is now having these pains.  Pt denies any recent injuries.  Pt state some chest pain and SOB. Pt states she feels like her heart is racing more when she stands and gets dizzy. Pt has hx of heart issues and currently has a heart monitor on.

## 2021-01-02 NOTE — ED Provider Notes (Signed)
[facility] Emergency Department Provider Note  ____________________________________________   Event Date/Time   First MD Initiated Contact with Patient 01/02/21 1148     (approximate)  I have reviewed the triage vital signs and the nursing notes.   HISTORY  Chief Complaint Rib Injury  HPI Sharon Rowland is a 35 y.o. female presents her self to the ED after being discharged from [facility] neurology, following occipital nerve blocks for her migraine history.  Patient reports is not the first time she has had the injections performed, but she noted in the recovery phase, she had elevated heart rate in the 250s.  Patient with a history of POTS, SVT, migraines, cervical radiculopathy, neurogenic bladder, presents for evaluation of shortness of breath, and severe right-sided chest wall pain.  She notes that her heart is racing more when she stands up that she gets dizzy.  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  She denies any syncope, cough, hemoptysis, distal paresthesias, lip tingling, or anxiety.      Past Medical History:  Diagnosis Date  . Allergic rhinitis   . Anemia   . Arthritis   . Asthma    last used inhaler 1 month ago  . Dysphagia   . Dysrhythmia    SVT  . Female bladder prolapse   . GERD (gastroesophageal reflux disease)    with pregnancy only  . Lupus (HCC)   . Migraines   . Pelvic pain in antepartum period in second trimester 01/17/2014  . Pregnant 08/01/2015  . PSVT (paroxysmal supraventricular tachycardia) (HCC)    could not be confirmed with 2016 study  . Rectocele   . Thyroid disease   . Urethral prolapse   . Uterine prolapse   . Vaginal Pap smear, abnormal     Patient Active Problem List   Diagnosis Date Noted  . Fall 03/09/2019  . Urinary retention 03/04/2019  . Numbness 03/04/2019  . Lhermitte's sign positive 03/04/2019  . Dysphagia 03/04/2019  . Acute nasopharyngitis  11/25/2018  . Hematuria 11/25/2018  . Burning with urination 11/25/2018  . S/P vaginal hysterectomy 09/17/2017  . Normal labor 08/10/2017  . Uterine/bladder prolapse 02/13/2017  . History of recurrent UTI (urinary tract infection) 02/27/2016  . Migraines 10/03/2015  . Supervision of normal pregnancy 09/05/2015  . Trigger point of shoulder region 06/20/2015  . Rh negative state in antepartum period 01/31/2014  . Abnormal Pap smear of cervix 01/31/2014  . Paroxysmal SVT (supraventricular tachycardia) (HCC) 11/09/2013  . Asthma, stable 11/09/2013    Past Surgical History:  Procedure Laterality Date  . ABDOMINAL HYSTERECTOMY    . ANTERIOR AND POSTERIOR VAGINAL REPAIR    . CYSTOCELE REPAIR N/A 09/17/2017   Procedure: ANTERIOR REPAIR (CYSTOCELE);  Surgeon: Lazaro Arms, MD;  Location: [facility];  Service: Gynecology;  Laterality: N/A;  . SUPRAVENTRICULAR TACHYCARDIA ABLATION  01/05/2015   unsuccessful  . SUPRAVENTRICULAR TACHYCARDIA ABLATION N/A 01/05/2015   Procedure: SUPRAVENTRICULAR TACHYCARDIA ABLATION;  Surgeon: Marinus Maw, MD;  Location: [facility] CATH LAB;  Service: Cardiovascular;  Laterality: N/A;  . VAGINAL HYSTERECTOMY N/A 09/17/2017   Procedure: HYSTERECTOMY VAGINAL WITH POSSIBLE BILATERAL SALPINGECTOMY;  Surgeon: Lazaro Arms, MD;  Location: [facility];  Service: Gynecology;  Laterality: N/A;    Prior to Admission medications   Medication Sig Start Date End Date Taking? Authorizing Provider  albuterol (PROVENTIL HFA;VENTOLIN HFA) 108 (90 Base) MCG/ACT inhaler Inhale 2 puffs into the lungs every 6 (six) hours as needed  for wheezing or shortness of breath.     [provider]  Calcium-Vitamin D-Vitamin K (VIACTIV CALCIUM PLUS D) 650-12.5-40 MG-MCG-MCG CHEW Chew 1 each by mouth daily.    [provider]  clobetasol ointment (TEMOVATE) 0.05 % Apply 1 application topically 2 (two) times daily. 04/24/20 04/24/21  [provider]  desonide (DESOWEN) 0.05 % ointment  Apply 1 application topically 2 (two) times daily. 04/24/20   [provider]  fludrocortisone (FLORINEF) 0.1 MG tablet Take 100 mcg by mouth daily. 11/25/19   [provider]  folic acid (FOLVITE) 1 MG tablet Take 1 mg by mouth daily. 03/09/20   [provider]  ibuprofen (ADVIL) 200 MG tablet Take 800 mg by mouth every 6 (six) hours as needed for mild pain or moderate pain.    [provider]  levothyroxine (SYNTHROID) 75 MCG tablet Take 75 mcg by mouth daily before breakfast.     [provider]  Liraglutide -Weight Management (SAXENDA) 18 MG/3ML SOPN Inject 0.6 mLs into the skin daily. 10/12/20   [provider]  methocarbamol (ROBAXIN) 500 MG tablet Take 500 mg by mouth 2 (two) times daily. 11/04/20   [provider]  metoCLOPramide (REGLAN) 5 MG tablet Take 5 mg by mouth 2 (two) times daily. 04/12/20   [provider]  Multiple Vitamin (MULTIVITAMIN WITH MINERALS) TABS tablet Take 1 tablet by mouth daily.    [provider]  nitrofurantoin, macrocrystal-monohydrate, (MACROBID) 100 MG capsule Take 1 po as directed Patient not taking: Reported on 11/12/2020 11/09/20   Lazaro Arms, MD  OnabotulinumtoxinA (BOTOX IJ) Inject 1 Dose as directed every 30 (thirty) days.     [provider]  phentermine (ADIPEX-P) 37.5 MG tablet Take 37.5 mg by mouth daily before breakfast.  09/22/20   [provider]  pregabalin (LYRICA) 25 MG capsule  11/08/20   [provider]  pregabalin (LYRICA) 50 MG capsule Take 50 mg by mouth 2 (two) times daily. 10/16/20   [provider]  Rimegepant Sulfate 75 MG TBDP Take 1 tablet by mouth daily as needed.  04/12/20   [provider]  UBRELVY 100 MG TABS Take by mouth. Patient not taking: Reported on 11/12/2020 10/25/20   [provider]    Allergies Plaquenil [hydroxychloroquine], Latex, and Zofran [ondansetron hcl]  Family History  Problem  Relation Age of Onset  . Hypertension Mother   . Hyperlipidemia Mother   . Multiple sclerosis Mother   . COPD Mother   . Cancer Mother        melanoma x 2  . Diabetes Mother   . Heart disease Father        PSVT  . Hyperlipidemia Brother   . Hypertension Brother   . Heart disease Maternal Grandfather        heart attack  . Cancer Paternal Grandmother        pancreatic cancer  . Heart disease Paternal Grandmother        MI  . Hyperlipidemia Paternal Grandmother   . Hypertension Paternal Grandmother   . Asthma Daughter   . Asthma Son   . Autism Son   . ADD / ADHD Son   . Tourette syndrome Son   . Other Son        Neurological and mental issues  . ADD / ADHD Son     Social History Social History   Tobacco Use  . Smoking status: Never Smoker  . Smokeless tobacco: Never Used  Vaping Use  . Vaping Use: Never used  Substance Use Topics  . Alcohol use: No  . Drug use: No    Review of Systems Constitutional: No fever/chills Eyes: No visual changes. ENT: No sore throat. Cardiovascular: Reports chest pain. Respiratory: Reports shortness of breath. Gastrointestinal: No abdominal pain.  No nausea, no vomiting.  No diarrhea.  No constipation. Genitourinary: Negative for dysuria. Musculoskeletal: Negative for back pain. Skin: Negative for rash. Neurological: Negative for headaches, focal weakness or numbness.  ____________________________________________   PHYSICAL EXAM:  VITAL SIGNS: ED Triage Vitals [01/02/21 1031]  Enc Vitals Group     BP (!) 155/96     Pulse Rate (!) 137     Resp 18     Temp 98.2 F (36.8 C)     Temp src      SpO2 100 %     Weight      Height      Head Circumference      Peak Flow      Pain Score 5     Pain Loc      Pain Edu?      Excl. in GC?    Constitutional: Alert and oriented. Well appearing and in no acute distress. Eyes: Conjunctivae are normal. PERRL. EOMI. Head: Atraumatic. Nose: No congestion/rhinnorhea. Mouth/Throat:  Mucous membranes are moist.  Oropharynx non-erythematous. Neck: No stridor.   Cardiovascular: Variable sinus rate, regular rhythm. Grossly normal heart sounds.  Good peripheral circulation. Respiratory: Normal respiratory effort.  No retractions. Lungs CTAB. Gastrointestinal: Soft and nontender. No distention. No abdominal bruits. No CVA tenderness. Musculoskeletal: No lower extremity tenderness nor edema.  No joint effusions. Neurologic:  Normal speech and language. No gross focal neurologic deficits are appreciated. No gait instability. Skin:  Skin is warm, dry and intact. No rash noted. Psychiatric: Mood and affect are normal. Speech and behavior are normal. ____________________________________________   LABS (all labs ordered are listed, but only abnormal results are displayed)  Labs Reviewed  BASIC METABOLIC PANEL - Abnormal; Notable for the following components:      Result Value   Glucose, Bld 112 (*)    All other components within normal limits  CBC  POC URINE PREG, ED  TROPONIN I (HIGH SENSITIVITY)   ____________________________________________  EKG  Sinus Tachycardia 107 bpm PR interval 132 ms QRS duration 78 ms Normal axis No STEMI ____________________________________________  RADIOLOGY I, Lissa Hoard, personally viewed and evaluated these images (plain radiographs) as part of my medical decision making, as well as reviewing the written report by the radiologist.  ED MD interpretation:  Agree with normal interpretation  Official radiology report(s): CT Angio Chest PE W and/or Wo Contrast  Result Date: 01/02/2021 CLINICAL DATA:  Chest pain and shortness of breath EXAM: CT ANGIOGRAPHY CHEST WITH CONTRAST TECHNIQUE: Multidetector CT imaging of the chest was performed using the standard protocol during bolus administration of intravenous contrast. Multiplanar CT image reconstructions and MIPs were obtained to evaluate the vascular anatomy. CONTRAST:  57mL  OMNIPAQUE IOHEXOL 350 MG/ML SOLN COMPARISON:  Chest radiograph and chest CT October 24, 2020 FINDINGS: Cardiovascular: There is no demonstrable pulmonary embolus. There is no thoracic aortic aneurysm or dissection. Visualized great vessels appear normal. There is no evident pericardial effusion or pericardial thickening. Mediastinum/Nodes: Visualized thyroid appears unremarkable. No evident thoracic adenopathy. No esophageal lesions are evident. Lungs/Pleura: The lungs are clear. No pleural effusions are evident. Upper Abdomen: Visualized upper abdominal structures appear unremarkable. Musculoskeletal: No blastic or lytic bone lesions.  No evident chest wall lesion. Review of the MIP images confirms the above findings. IMPRESSION: 1. No demonstrable pulmonary embolus. No thoracic aortic aneurysm or dissection. 2.  Lungs clear. 3.  No evident adenopathy. Electronically Signed   By: Bretta Bang III M.D.   On: 01/02/2021 13:20    ____________________________________________   PROCEDURES  Procedure(s) performed (including Critical Care):  Procedures   ____________________________________________   INITIAL IMPRESSION / ASSESSMENT AND PLAN / ED COURSE  As part of my medical decision making, I reviewed the following data within the electronic MEDICAL RECORD NUMBER EKG interpreted tachycardia, Old chart reviewed, Notes from prior ED visits and [place] Controlled Substance Database     Differential includes, but is not limited to, viral syndrome, bronchitis including COPD exacerbation, pneumonia, reactive airway disease including asthma, CHF including exacerbation with or without pulmonary/interstitial edema, pneumothorax, ACS, thoracic trauma, and pulmonary embolism.  Patient with ED evaluation following a nerve block procedure, presenting with chest pain, shortness of breath, and tachycardia. She has an interesting PMH including POTS, migraines, cervical radiculopathy, and new episodic bradycardia; for  which she has a Holter monitor.  She was evaluated for her symptom presentation, and was evaluated with labs.  Labs are reassuring at the time of presentation.  EKG is also negative and shows only sinus tachycardia without other abnormality.  CT angio was performed and reviewed which was negative for any acute PE or other pulmonary findings.  Patient has also been given a fluid bolus, and reports improvement of her symptoms overall at this time.  She is cleared to discharge to follow with primary provider or specialist as planned.  Return precautions have been discussed.   ____________________________________________   FINAL CLINICAL IMPRESSION(S) / ED DIAGNOSES  Final diagnoses:  Adverse effect of peripheral nerve- and plexus-blocking anesthetics, initial encounter  Shortness of breath     ED Discharge Orders    None      *Please note:  Nadelyn Enriques was evaluated in Emergency Department on 01/02/2021 for the symptoms described in the history of present illness. She was evaluated in the context of the global COVID-19 pandemic, which necessitated consideration that the patient might be at risk for infection with the SARS-CoV-2 virus that causes COVID-19. Institutional protocols and algorithms that pertain to the evaluation of patients at risk for COVID-19 are in a state of rapid change based on information released by regulatory bodies including the CDC and federal and state organizations. These policies and algorithms were followed during the patient's care in the ED.  Some ED evaluations and interventions may be delayed as a result of limited staffing during and the pandemic.*   Note:  This document was prepared using Dragon voice recognition software and may include unintentional dictation errors.    Lissa Hoard, PA-C 01/02/21 1552    Delton [place], MD 01/02/21 [phone]

## 2021-01-18 ENCOUNTER — Other Ambulatory Visit: Payer: Self-pay | Admitting: "Endocrinology

## 2021-01-18 ENCOUNTER — Other Ambulatory Visit (HOSPITAL_COMMUNITY): Payer: Self-pay | Admitting: "Endocrinology

## 2021-01-18 DIAGNOSIS — E041 Nontoxic single thyroid nodule: Secondary | ICD-10-CM

## 2021-01-23 DIAGNOSIS — M542 Cervicalgia: Principal | ICD-10-CM

## 2021-01-23 DIAGNOSIS — M5416 Radiculopathy, lumbar region: Principal | ICD-10-CM

## 2021-01-23 DIAGNOSIS — M5412 Radiculopathy, cervical region: Principal | ICD-10-CM

## 2021-01-24 ENCOUNTER — Encounter: Admit: 2021-01-24 | Discharge: 2021-01-25 | Payer: PRIVATE HEALTH INSURANCE

## 2021-01-24 DIAGNOSIS — M542 Cervicalgia: Principal | ICD-10-CM

## 2021-02-05 DIAGNOSIS — L409 Psoriasis, unspecified: Principal | ICD-10-CM

## 2021-02-09 ENCOUNTER — Emergency Department (HOSPITAL_COMMUNITY): Payer: BLUE CROSS/BLUE SHIELD

## 2021-02-09 ENCOUNTER — Encounter (HOSPITAL_COMMUNITY): Payer: Self-pay

## 2021-02-09 ENCOUNTER — Emergency Department (HOSPITAL_COMMUNITY)
Admission: EM | Admit: 2021-02-09 | Discharge: 2021-02-10 | Disposition: A | Discharge status: Home / Self Care | Payer: BLUE CROSS/BLUE SHIELD | Attending: Emergency Medicine | Admitting: Emergency Medicine

## 2021-02-09 ENCOUNTER — Other Ambulatory Visit: Payer: Self-pay

## 2021-02-09 DIAGNOSIS — U071 COVID-19: Secondary | ICD-10-CM | POA: Diagnosis not present

## 2021-02-09 DIAGNOSIS — R002 Palpitations: Secondary | ICD-10-CM | POA: Diagnosis not present

## 2021-02-09 DIAGNOSIS — J45909 Unspecified asthma, uncomplicated: Secondary | ICD-10-CM | POA: Insufficient documentation

## 2021-02-09 DIAGNOSIS — R059 Cough, unspecified: Secondary | ICD-10-CM | POA: Diagnosis present

## 2021-02-09 DIAGNOSIS — Z91040 Latex allergy status: Secondary | ICD-10-CM | POA: Insufficient documentation

## 2021-02-09 DIAGNOSIS — N3000 Acute cystitis without hematuria: Secondary | ICD-10-CM | POA: Diagnosis not present

## 2021-02-09 DIAGNOSIS — R000 Tachycardia, unspecified: Secondary | ICD-10-CM | POA: Diagnosis not present

## 2021-02-09 MED ORDER — KETOROLAC TROMETHAMINE 30 MG/ML IJ SOLN
30.0000 mg | Freq: Once | INTRAMUSCULAR | Status: AC
  Administered 2021-02-10: 30 mg via INTRAVENOUS
  Filled 2021-02-09: qty 1

## 2021-02-09 MED ORDER — LACTATED RINGERS IV BOLUS
2000.0000 mL | Freq: Once | INTRAVENOUS | Status: AC
  Administered 2021-02-10: 2000 mL via INTRAVENOUS

## 2021-02-09 NOTE — ED Triage Notes (Signed)
Pt reports having a Temp of 101 F at home PTA and taking Tylenol earlier today. Pt reports testing Positive for Covid as well as her spouse and children with self test kits at home. Pt present with tachycardia.

## 2021-02-10 ENCOUNTER — Emergency Department (HOSPITAL_COMMUNITY): Payer: BLUE CROSS/BLUE SHIELD

## 2021-02-10 LAB — URINALYSIS, ROUTINE W REFLEX MICROSCOPIC
Bilirubin Urine: NEGATIVE
Glucose, UA: NEGATIVE mg/dL
Hgb urine dipstick: NEGATIVE
Ketones, ur: NEGATIVE mg/dL
Leukocytes,Ua: NEGATIVE
Nitrite: POSITIVE — AB
Protein, ur: NEGATIVE mg/dL
Specific Gravity, Urine: 1.01 (ref 1.005–1.030)
pH: 7 (ref 5.0–8.0)

## 2021-02-10 LAB — COMPREHENSIVE METABOLIC PANEL
ALT: 16 U/L (ref 0–44)
AST: 11 U/L — ABNORMAL LOW (ref 15–41)
Albumin: 3.9 g/dL (ref 3.5–5.0)
Alkaline Phosphatase: 67 U/L (ref 38–126)
Anion gap: 7 (ref 5–15)
BUN: 8 mg/dL (ref 6–20)
CO2: 27 mmol/L (ref 22–32)
Calcium: 9.3 mg/dL (ref 8.9–10.3)
Chloride: 101 mmol/L (ref 98–111)
Creatinine, Ser: 0.63 mg/dL (ref 0.44–1.00)
GFR, Estimated: >60 mL/min (ref >60)
Glucose, Bld: 108 mg/dL — ABNORMAL HIGH (ref 70–99)
Potassium: 4.1 mmol/L (ref 3.5–5.1)
Sodium: 135 mmol/L (ref 135–145)
Total Bilirubin: 0.7 mg/dL (ref 0.3–1.2)
Total Protein: 6.8 g/dL (ref 6.5–8.1)

## 2021-02-10 LAB — CBC WITH DIFFERENTIAL/PLATELET
Abs Immature Granulocytes: 0.01 10*3/uL (ref 0.00–0.07)
Basophils Absolute: 0 10*3/uL (ref 0.0–0.1)
Basophils Relative: 0 %
Eosinophils Absolute: 0 10*3/uL (ref 0.0–0.5)
Eosinophils Relative: 0 %
HCT: 40.4 % (ref 36.0–46.0)
Hemoglobin: 13.2 g/dL (ref 12.0–15.0)
Immature Granulocytes: 0 %
Lymphocytes Relative: 10 %
Lymphs Abs: 0.8 10*3/uL (ref 0.7–4.0)
MCH: 28.4 pg (ref 26.0–34.0)
MCHC: 32.7 g/dL (ref 30.0–36.0)
MCV: 87.1 fL (ref 80.0–100.0)
Monocytes Absolute: 1 10*3/uL (ref 0.1–1.0)
Monocytes Relative: 13 %
Neutro Abs: 6.3 10*3/uL (ref 1.7–7.7)
Neutrophils Relative %: 77 %
Platelets: 240 10*3/uL (ref 150–400)
RBC: 4.64 MIL/uL (ref 3.87–5.11)
RDW: 13.2 % (ref 11.5–15.5)
WBC: 8.2 10*3/uL (ref 4.0–10.5)
nRBC: 0 % (ref 0.0–0.2)

## 2021-02-10 LAB — I-STAT BETA HCG BLOOD, ED (MC, WL, AP ONLY): I-stat hCG, quantitative: <5 m[IU]/mL (ref <5)

## 2021-02-10 LAB — POC SARS CORONAVIRUS 2 AG -  ED: SARS Coronavirus 2 Ag: POSITIVE — AB

## 2021-02-10 MED ORDER — NITROFURANTOIN MONOHYD MACRO 100 MG PO CAPS: 100.0000 mg | ORAL_CAPSULE | Freq: Two times a day (BID) | ORAL | 0 refills | Status: DC

## 2021-02-10 MED ORDER — DIPHENHYDRAMINE HCL 50 MG/ML IJ SOLN
25.0000 mg | Freq: Once | INTRAMUSCULAR | Status: AC
  Administered 2021-02-10: 25 mg via INTRAVENOUS
  Filled 2021-02-10: qty 1

## 2021-02-10 MED ORDER — METOCLOPRAMIDE HCL 5 MG/ML IJ SOLN
10.0000 mg | Freq: Once | INTRAMUSCULAR | Status: AC
  Administered 2021-02-10: 10 mg via INTRAVENOUS
  Filled 2021-02-10: qty 2

## 2021-02-10 NOTE — ED Provider Notes (Signed)
[facility] EMERGENCY DEPARTMENT Provider Note   CSN: [id number] Arrival date & time: 02/09/21  2130     History Chief Complaint  Patient presents with  . Tachycardia    Covid +    Sharon Rowland is a 35 y.o. female.  The history is provided by the patient.  Palpitations Palpitations quality:  Fast Onset quality:  Sudden Timing:  Constant Progression:  Worsening Chronicity:  New Relieved by:  Nothing Worsened by:  Nothing Associated symptoms: cough   Associated symptoms: no vomiting   Patient presents for multiple complaints.  Patient has a history of POTS, SVT, migraines presents with fever and reportedly tested positive for COVID-19 at home.  Patient reports she tested positive at home approximately 5 days ago.  She reports she had cough and congestion.  She now reports increased fatigue and decreased oral intake.  No vomiting.  No chest pain, but does feel short of breath when her heart rate is high.  She also reports symptoms of UTI Patient reports she will have frequent episodes of tachycardia but usually her resting heart rate is in the 80s.     Past Medical History:  Diagnosis Date  . Allergic rhinitis   . Anemia   . Arthritis   . Asthma    last used inhaler 1 month ago  . Dysphagia   . Dysrhythmia    SVT  . Female bladder prolapse   . GERD (gastroesophageal reflux disease)    with pregnancy only  . Lupus (HCC)   . Migraines   . Pelvic pain in antepartum period in second trimester 01/17/2014  . Pregnant 08/01/2015  . PSVT (paroxysmal supraventricular tachycardia) (HCC)    could not be confirmed with 2016 study  . Rectocele   . Thyroid disease   . Urethral prolapse   . Uterine prolapse   . Vaginal Pap smear, abnormal     Patient Active Problem List   Diagnosis Date Noted  . Fall 03/09/2019  . Urinary retention 03/04/2019  . Numbness 03/04/2019  . Lhermitte's sign positive 03/04/2019  . Dysphagia 03/04/2019  . Acute nasopharyngitis 11/25/2018  .  Hematuria 11/25/2018  . Burning with urination 11/25/2018  . S/P vaginal hysterectomy 09/17/2017  . Normal labor 08/10/2017  . Uterine/bladder prolapse 02/13/2017  . History of recurrent UTI (urinary tract infection) 02/27/2016  . Migraines 10/03/2015  . Supervision of normal pregnancy 09/05/2015  . Trigger point of shoulder region 06/20/2015  . Rh negative state in antepartum period 01/31/2014  . Abnormal Pap smear of cervix 01/31/2014  . Paroxysmal SVT (supraventricular tachycardia) (HCC) 11/09/2013  . Asthma, stable 11/09/2013    Past Surgical History:  Procedure Laterality Date  . ABDOMINAL HYSTERECTOMY    . ANTERIOR AND POSTERIOR VAGINAL REPAIR    . CYSTOCELE REPAIR N/A 09/17/2017   Procedure: ANTERIOR REPAIR (CYSTOCELE);  Surgeon: Lazaro Arms, MD;  Location: [facility];  Service: Gynecology;  Laterality: N/A;  . SUPRAVENTRICULAR TACHYCARDIA ABLATION  01/05/2015   unsuccessful  . SUPRAVENTRICULAR TACHYCARDIA ABLATION N/A 01/05/2015   Procedure: SUPRAVENTRICULAR TACHYCARDIA ABLATION;  Surgeon: Marinus Maw, MD;  Location: [facility] CATH LAB;  Service: Cardiovascular;  Laterality: N/A;  . VAGINAL HYSTERECTOMY N/A 09/17/2017   Procedure: HYSTERECTOMY VAGINAL WITH POSSIBLE BILATERAL SALPINGECTOMY;  Surgeon: Lazaro Arms, MD;  Location: [facility];  Service: Gynecology;  Laterality: N/A;     OB History    Gravida  6   Para  6   Term  6   Preterm  AB      Living  6     SAB      IAB      Ectopic      Multiple  0   Live Births  6           Family History  Problem Relation Age of Onset  . Hypertension Mother   . Hyperlipidemia Mother   . Multiple sclerosis Mother   . COPD Mother   . Cancer Mother        melanoma x 2  . Diabetes Mother   . Heart disease Father        PSVT  . Hyperlipidemia Brother   . Hypertension Brother   . Heart disease Maternal Grandfather        heart attack  . Cancer Paternal Grandmother        pancreatic cancer  . Heart disease  Paternal Grandmother        MI  . Hyperlipidemia Paternal Grandmother   . Hypertension Paternal Grandmother   . Asthma Daughter   . Asthma Son   . Autism Son   . ADD / ADHD Son   . Tourette syndrome Son   . Other Son        Neurological and mental issues  . ADD / ADHD Son     Social History   Tobacco Use  . Smoking status: Never Smoker  . Smokeless tobacco: Never Used  Vaping Use  . Vaping Use: Never used  Substance Use Topics  . Alcohol use: No  . Drug use: No    Home Medications Prior to Admission medications   Medication Sig Start Date End Date Taking? Authorizing Provider  albuterol (PROVENTIL HFA;VENTOLIN HFA) 108 (90 Base) MCG/ACT inhaler Inhale 2 puffs into the lungs every 6 (six) hours as needed for wheezing or shortness of breath.     [provider]  Calcium-Vitamin D-Vitamin K (VIACTIV CALCIUM PLUS D) 650-12.5-40 MG-MCG-MCG CHEW Chew 1 each by mouth daily.    [provider]  clobetasol ointment (TEMOVATE) 0.05 % Apply 1 application topically 2 (two) times daily. 04/24/20 04/24/21  [provider]  desonide (DESOWEN) 0.05 % ointment Apply 1 application topically 2 (two) times daily. 04/24/20   [provider]  fludrocortisone (FLORINEF) 0.1 MG tablet Take 100 mcg by mouth daily. 11/25/19   [provider]  folic acid (FOLVITE) 1 MG tablet Take 1 mg by mouth daily. 03/09/20   [provider]  ibuprofen (ADVIL) 200 MG tablet Take 800 mg by mouth every 6 (six) hours as needed for mild pain or moderate pain.    [provider]  levothyroxine (SYNTHROID) 75 MCG tablet Take 75 mcg by mouth daily before breakfast.     [provider]  Liraglutide -Weight Management (SAXENDA) 18 MG/3ML SOPN Inject 0.6 mLs into the skin daily. 10/12/20   [provider]  methocarbamol (ROBAXIN) 500 MG tablet Take 500 mg by mouth 2 (two) times daily. 11/04/20   [provider]  metoCLOPramide (REGLAN) 5 MG tablet  Take 5 mg by mouth 2 (two) times daily. 04/12/20   [provider]  Multiple Vitamin (MULTIVITAMIN WITH MINERALS) TABS tablet Take 1 tablet by mouth daily.    [provider]  nitrofurantoin, macrocrystal-monohydrate, (MACROBID) 100 MG capsule Take 1 po as directed Patient not taking: Reported on 11/12/2020 11/09/20   Lazaro Arms, MD  OnabotulinumtoxinA (BOTOX IJ) Inject 1 Dose as directed every 30 (thirty) days.  [provider]  phentermine (ADIPEX-P) 37.5 MG tablet Take 37.5 mg by mouth daily before breakfast.  09/22/20   [provider]  pregabalin (LYRICA) 25 MG capsule  11/08/20   [provider]  pregabalin (LYRICA) 50 MG capsule Take 50 mg by mouth 2 (two) times daily. 10/16/20   [provider]  Rimegepant Sulfate 75 MG TBDP Take 1 tablet by mouth daily as needed.  04/12/20   [provider]  UBRELVY 100 MG TABS Take by mouth. Patient not taking: Reported on 11/12/2020 10/25/20   [provider]    Allergies    Plaquenil [hydroxychloroquine], Latex, Tape, and Zofran [ondansetron hcl]  Review of Systems   Review of Systems  Constitutional: Positive for appetite change, fatigue and fever.  Respiratory: Positive for cough.   Cardiovascular: Positive for palpitations.  Gastrointestinal: Negative for vomiting.  Genitourinary: Positive for dysuria.  All other systems reviewed and are negative.   Physical Exam Updated Vital Signs BP 122/89   Pulse (!) 138   Temp 99.9 F (37.7 C) (Oral)   Resp (!) 26   Ht 1.6 m (5\' 3" )   Wt 72.6 kg   LMP 10/23/2016 (Approximate)   SpO2 100%   BMI 28.34 kg/m   Physical Exam CONSTITUTIONAL: Well developed/well nourished HEAD: Normocephalic/atraumatic EYES: EOMI/PERRL ENMT: Mask in place NECK: supple no meningeal signs SPINE/BACK:entire spine nontender CV: S1/S2 noted, tachycardic LUNGS: Lungs are clear to auscultation bilaterally, no apparent distress ABDOMEN:  soft, nontender, no rebound or guarding, bowel sounds noted throughout abdomen GU:no cva tenderness NEURO: Pt is awake/alert/appropriate, moves all extremitiesx4.  No facial droop.   EXTREMITIES: pulses normal/equal, full ROM SKIN: warm, color normal PSYCH: no abnormalities of mood noted, alert and oriented to situation  ED Results / Procedures / Treatments   Labs (all labs ordered are listed, but only abnormal results are displayed) Labs Reviewed  COMPREHENSIVE METABOLIC PANEL - Abnormal; Notable for the following components:      Result Value   Glucose, Bld 108 (*)    AST 11 (*)    All other components within normal limits  URINALYSIS, ROUTINE W REFLEX MICROSCOPIC - Abnormal; Notable for the following components:   APPearance HAZY (*)    Nitrite POSITIVE (*)    Bacteria, UA RARE (*)    All other components within normal limits  POC SARS CORONAVIRUS 2 AG -  ED - Abnormal; Notable for the following components:   SARS Coronavirus 2 Ag POSITIVE (*)    All other components within normal limits  CBC WITH DIFFERENTIAL/PLATELET  I-STAT BETA HCG BLOOD, ED ([facility], [facility], [facility] ONLY)    EKG EKG Interpretation  Date/Time:  Friday February 09 2021 22:19:35 EST Ventricular Rate:  124 PR Interval:    QRS Duration: 86 QT Interval:  273 QTC Calculation: 392 R Axis:   69 Text Interpretation: Sinus tachycardia Consider right atrial enlargement Borderline T wave abnormalities Confirmed by 06-21-1995 (Zadie Rhine) on 02/09/2021 11:09:55 PM   Radiology No results found.  Procedures Procedures   Medications Ordered in ED Medications  lactated ringers bolus 2,000 mL (0 mLs Intravenous Stopped 02/10/21 0249)  ketorolac (TORADOL) 30 MG/ML injection 30 mg (30 mg Intravenous Given 02/10/21 0011)  metoCLOPramide (REGLAN) injection 10 mg (10 mg Intravenous Given 02/10/21 0049)  diphenhydrAMINE (BENADRYL) injection 25 mg (25 mg Intravenous Given 02/10/21 0047)    ED Course  I have reviewed the triage  vital signs and the nursing notes.  Pertinent labs & imaging results  that were available during my care of the patient were reviewed by me and considered in my medical decision making (see chart for details).    MDM Rules/Calculators/A&P                          12:31 AM Patient with history of POTS, dysautonomia presents with fevers, tachycardia and recent Covid infection.  Patient is tachycardic but is overall in no acute distress.  Patient reports she feels dehydrated.  We will give IV fluids, check a chest x-ray and labs.   After monitoring and treatment in the ER for several hours, patient feels dramatically improved.  Chest x-ray was reviewed and no signs of pneumonia.  No hypoxia.  I have very low suspicion for acute PE. Vital signs have responded to IV fluids and tachycardia is now around 108 Patient does have evidence of UTI.  She reports good response to Macrobid in the past, this was prescribed for 7 days  Overall patient is well-appearing and clinically nontoxic.  She feels comfortable with discharge home  Sharon Rowland was evaluated in Emergency Department on 02/10/2021 for the symptoms described in the history of present illness. She was evaluated in the context of the global COVID-19 pandemic, which necessitated consideration that the patient might be at risk for infection with the SARS-CoV-2 virus that causes COVID-19. Institutional protocols and algorithms that pertain to the evaluation of patients at risk for COVID-19 are in a state of rapid change based on information released by regulatory bodies including the CDC and federal and state organizations. These policies and algorithms were followed during the patient's care in the ED.  Final Clinical Impression(s) / ED Diagnoses Final diagnoses:  COVID-19  Acute cystitis without hematuria  Palpitations    Rx / DC Orders ED Discharge Orders         Ordered    nitrofurantoin, macrocrystal-monohydrate, (MACROBID) 100 MG  capsule  2 times daily        02/10/21 0240           Zadie Rhine, MD 02/10/21 0423

## 2021-02-11 ENCOUNTER — Telehealth: Payer: Self-pay | Admitting: Unknown Physician Specialty

## 2021-02-11 NOTE — Telephone Encounter (Signed)
Called to discuss with patient about COVID-19 symptoms and the use of one of the available treatments for those with mild to moderate Covid symptoms and at a high risk of hospitalization.  Pt appears to qualify for outpatient treatment due to co-morbid conditions and/or a member of an at-risk group in accordance with the FDA Emergency Use Authorization.    Symptom onset: 2/15? Vaccinated: ? Booster?   Unable to reach pt - LMOM and mychart   Gabriel Cirri

## 2021-02-12 ENCOUNTER — Other Ambulatory Visit: Payer: Self-pay | Admitting: Physician Assistant

## 2021-02-12 DIAGNOSIS — J45909 Unspecified asthma, uncomplicated: Secondary | ICD-10-CM

## 2021-02-12 DIAGNOSIS — U071 COVID-19: Secondary | ICD-10-CM

## 2021-02-12 DIAGNOSIS — L4050 Arthropathic psoriasis, unspecified: Secondary | ICD-10-CM

## 2021-02-12 DIAGNOSIS — I471 Supraventricular tachycardia: Secondary | ICD-10-CM

## 2021-02-12 NOTE — Progress Notes (Signed)
I connected by phone with Sharon Rowland on 02/12/2021 at 4:43 PM to discuss the potential use of a new treatment for mild to moderate COVID-19 viral infection in non-hospitalized patients.  This patient is a 35 y.o. female that meets the FDA criteria for Emergency Use Authorization of COVID monoclonal antibody sotrovimab.   Has a (+) direct SARS-CoV-2 viral test result  Has mild or moderate COVID-19   Is NOT hospitalized due to COVID-19  Is within 10 days of symptom onset  Has at least one of the high risk factor(s) for progression to severe COVID-19 and/or hospitalization as defined in EUA.  Specific high risk criteria : BMI > 25, Immunosuppressive Disease or Treatment and Cardiovascular disease or hypertension   I have spoken and communicated the following to the patient or parent/caregiver regarding COVID monoclonal antibody treatment:  1. FDA has authorized the emergency use for the treatment of mild to moderate COVID-19 in adults and pediatric patients with positive results of direct SARS-CoV-2 viral testing who are 76 years of age and older weighing at least 40 kg, and who are at high risk for progressing to severe COVID-19 and/or hospitalization.  2. The significant known and potential risks and benefits of COVID monoclonal antibody, and the extent to which such potential risks and benefits are unknown.  3. Information on available alternative treatments and the risks and benefits of those alternatives, including clinical trials.  4. Patients treated with COVID monoclonal antibody should continue to self-isolate and use infection control measures (e.g., wear mask, isolate, social distance, avoid sharing personal items, clean and disinfect "high touch" surfaces, and frequent handwashing) according to CDC guidelines.   5. The patient or parent/caregiver has the option to accept or refuse COVID monoclonal antibody treatment.  After reviewing this information with the patient, the  patient has agreed to receive one of the available covid 19 monoclonal antibodies and will be provided an appropriate fact sheet prior to infusion.  Sx onset 2/13. Set up for infusion on 2/22 @ 10:30am. Directions given to [facility]. Pt is aware that insurance will be charged an infusion fee. Pt is fully vaccinated and boosted.   [name] 02/12/2021 4:43 PM

## 2021-02-12 NOTE — Telephone Encounter (Signed)
Second attempt to reach pt. Left VM and mychart message.   [name] PA-C  [facility]

## 2021-02-13 ENCOUNTER — Telehealth (HOSPITAL_COMMUNITY): Payer: Self-pay

## 2021-02-13 ENCOUNTER — Ambulatory Visit (HOSPITAL_COMMUNITY): Payer: BLUE CROSS/BLUE SHIELD

## 2021-02-13 NOTE — Telephone Encounter (Signed)
Patient scheduled for 10:30 appt for MAB infusion. Called patient to inquire if she was still coming. Left voicemail requesting call back. Patient marked as no show

## 2021-02-23 ENCOUNTER — Encounter
Admit: 2021-02-23 | Discharge: 2021-02-24 | Payer: PRIVATE HEALTH INSURANCE | Attending: Student in an Organized Health Care Education/Training Program | Primary: Student in an Organized Health Care Education/Training Program

## 2021-02-23 DIAGNOSIS — R21 Rash and other nonspecific skin eruption: Principal | ICD-10-CM

## 2021-02-23 DIAGNOSIS — L4050 Arthropathic psoriasis, unspecified: Principal | ICD-10-CM

## 2021-02-23 DIAGNOSIS — L409 Psoriasis, unspecified: Principal | ICD-10-CM

## 2021-02-23 MED ORDER — CLOBETASOL 0.05 % TOPICAL OINTMENT
OPHTHALMIC | 1 refills | 0.00000 days | Status: CP
Start: 2021-02-23 — End: ?

## 2021-02-26 ENCOUNTER — Other Ambulatory Visit: Payer: Self-pay | Admitting: Obstetrics & Gynecology

## 2021-02-28 MED ORDER — CALCIPOTRIENE 0.005 % TOPICAL OINTMENT
Freq: Two times a day (BID) | TOPICAL | 3 refills | 0.00000 days | Status: CP
Start: 2021-02-28 — End: 2022-02-28

## 2021-03-01 DIAGNOSIS — L4050 Arthropathic psoriasis, unspecified: Principal | ICD-10-CM

## 2021-03-01 DIAGNOSIS — L409 Psoriasis, unspecified: Principal | ICD-10-CM

## 2021-03-01 MED ORDER — APREMILAST 30 MG TABLET
ORAL_TABLET | Freq: Two times a day (BID) | ORAL | 11 refills | 30.00000 days | Status: CP
Start: 2021-03-01 — End: ?

## 2021-03-01 MED ORDER — OTEZLA STARTER 10 MG (4)-20 MG (4)-30 MG(47) TABLETS IN A DOSE PACK
ORAL_TABLET | ORAL | 0 refills | 0.00000 days | Status: CP
Start: 2021-03-01 — End: ?
  Filled 2021-03-14: qty 55, 28d supply, fill #0

## 2021-03-06 NOTE — Unmapped (Signed)
[facility] Specialty Medication Onboarding    Specialty Medication: Otezla starter pack and maintenance  Prior Authorization: Approved   Financial Assistance: No - copay  <$25  Final Copay/Day Supply: $0 / 28 days (starter) $ 0/ 30 days (maintenance)     Insurance Restrictions: Yes - max 1 month supply     Notes to Pharmacist:     The triage team has completed the benefits investigation and has determined that the patient is able to fill this medication at [facility]. Please contact the patient to complete the onboarding or follow up with the prescribing physician as needed.

## 2021-03-09 NOTE — Unmapped (Signed)
NEW PATIENT SCREENING-Psychiatry @ [facility]    **LOCAL SERVICES-Medicaid LME-[facility], insurance provider, [email or web address], ???google??? psychiatry/psychology in local area**    **IF PATIENT IS PART OF [facility], WE WILL ACCEPT THEM**    Procedure  Did clinic staff reach out to you already, or was a referral sent in on your behalf that you're aware of?  [] YES-proceed to EPIC to review referral/documentation encounter  [x] NO-are you self-referred?   [x] YES-proceed to screening questions   [] NO-referred by who?     SCREENING QUESTIONS:  What services are you looking for?     [x] Meds/diagnosis review   [] Therapy only (suggest use of local except WMD)    [] WMD therapy follow psychotherapy algorithm or email community resources   [] Psychological/Neuropsychological testing (suggest use of local)      ADULT 18-64 (MEDICATIONS/DIAGNOSIS REVIEW):  Do you have a confirmed diagnosis?   [] YES-  [] YES to schizophrenia/manic/psychosis/bipolar with mania or psychosis or recent hospitalization  refer to STEP Dava Najjar [phone] or [facility] STEP [phone]  [] YES to new onset psychosis/psychotic break within 3 years, ages 63-36  refer to [facility] [phone]  [x] NO-follow screening below, according to age/diagnosis:      Is patient under 40 with general diagnosis anxiety/depression/OCD/mood disorder/etc  [x] YES, patient may be appropriate for ADTC/NP    Does patient have a [place] PCP or under care of a [place] Specialist (within last 12 months)?  [x] YES-add to waitlist (NP-insured/non-complicated or ADTC resident-uninsured/complicated)   **Send adult packet if not enough psych information in Epic**  [] NO-does patient live in [place]?  [] YES-send packet (not added to wait list until packet returned and reviewed for acceptance)  [] NO-suggest use of local services      ADULT 65+ (MEDICATIONS/DIAGNOSIS REVIEW):  Is patient 65+ OR have confirmed cognitive diagnosis?  [] YES-patient may be appropriate for GERO    Does patient have a [place] PCP or under care of a [employer] (within last 12 months)?  [] YES-add to waitlist (cognitive or complicated-GERO resident, non-cognitive/non-complicated-NP)   *Send adult packet if not enough psych information in Epic**  [] NO-does patient live in [place]?  [] YES-send packet (not added to wait list until packet returned and reviewed for acceptance)  [] NO-suggest use of local services      WMD/PERINATAL/POSTPARTUM up to 12 months (MEDICATIONS/DIAGNOSIS REVIEW):  Is patient perinatal/postpartum (up to 12 months)/preconception/PMDD/perimenopause?  [] YES-patient is appropriate for WMD (no packet required)   [] Insured ([facility]), uninsured (WMD resident-add to waitlist)   [] PMDD-smart phrase screening (WMD resident-add to waitlist)  [] Preconception(WMD resident-add to waitlist)  [] Perimenopause-smart phrase screening (schedule with Harrold Donath)        Child/DD-adult & child (MEDICATION/DIAGNOSIS REVIEW): (not added to wait list until packet returned and reviewed for acceptance)  Is there a confirmed/suspected developmental or neurodevelopmental diagnosis?  [] YES-send packet (child/DD)  [] NO-patient live in [place] or have a [place] PCP or under the care of a [employer] (within last 12 months)?  [] YES-send packet  [] NO-suggest use of local services      OUTCOME: Emailed new packet to :[email or web address]      [x]  Packet sent:  patient aware packet will need to be returned within 60 days to remain on wait list or for referral to remain in active status

## 2021-03-12 NOTE — Unmapped (Signed)
[facility]   Patient Onboarding/Medication Counseling    [name] is a 35 y.o. female with psoriasis and psoriatic arthritis who I am counseling today on initiation of therapy.  I am speaking to the patient.    Was a Nurse, learning disability used for this call? No    Verified patient's date of birth / HIPAA.    Specialty medication(s) to be sent: Inflammatory Disorders: Otezla      Non-specialty medications/supplies to be sent: na      Medications not needed at this time: na         Otezla (apremilast)    Medication & Administration     Dosage: Psoriasis: Take 10mg  by mouth in the morning on day 1, 10mg  twice daily on day 2, 10mg  in the morning and 20mg  in the evening on day 3, 20mg  twice daily on day 4, 20mg  in the morning and 30mg  in the evening on day 5, then 30mg  twice daily thereafter    Administration: Take tablets by mouth.  Swallow whole ??? do not chew, break, or crush.    Adherence/Missed dose instructions:  Take a missed dose as soon as you think about it if it's within 6 hours of your normal dosing time.  Never take two doses at the same time to try and catch up after a missed dose.    Goals of Therapy     Beh??et disease  ??? Suppress inflammatory exacerbations and recurrences  ??? Prevent irreversible organ damage  ??? Achieve low disease activity  ??? Maintenance of effective psychosocial functioning    Plaque Psoriasis  ??? Minimize areas of skin involvement (% BSA)  ??? Avoidance of long term glucocorticoid use  ??? Maintenance of effective psychosocial functioning    Psoriatic arthritis  ??? Achieve remission/inactive disease or low/minimal disease activity  ??? Maintenance of function  ??? Minimization of systemic manifestations and comorbidities  ??? Maintenance of effective psychosocial functioning      Side Effects & Monitoring Parameters     ??? Upset stomach and/or diarrhea  ??? Headache  ??? Weight loss    The following side effects should be reported to the provider:  ??? Signs of a hypersensitivity reaction ??? rash; hives; itching; red, swollen, blistered, or peeling skin; wheezing; tightness in the chest or throat; difficulty breathing, swallowing, or talking; swelling of the mouth, face, lips, tongue, or throat; etc.  ??? Excessive weight loss  ??? Changes in mood (depression) or behavior      Contraindications, Warnings, & Precautions     ??? Have your bloodwork checked as you have been told by your prescriber  ??? Talk with your doctor if you are pregnant, planning to become pregnant, or breastfeeding      Drug/Food Interactions     ??? Medication list reviewed in Epic. The patient was instructed to inform the care team before taking any new medications or supplements. No drug interactions identified.   ??? Apremilast is a major substrate for CYP3A4 ??? consult care team before starting any new medication (Rx or OTC) or supplements    Storage, Handling Precautions, & Disposal     ??? Store at room temperature  ??? Protect from moisture          Current Medications (including OTC/herbals), Comorbidities and Allergies     Current Outpatient Medications   Medication Sig Dispense Refill   ??? acetaminophen (TYLENOL) 500 MG tablet Take 1,000 mg by mouth daily as needed.     ??? albuterol  HFA 90 mcg/actuation inhaler Inhale 1 puff daily as needed.     ??? apremilast (OTEZLA STARTER) 10 mg (4)-20 mg (4)-30 mg (47) DsPk Take as directed on package. 55 tablet 0   ??? apremilast 30 mg Tab Take 1 tablet (30 mg total) by mouth Two (2) times a day (Maintenance Dose) 60 tablet 11   ??? calcipotriene (DOVONOX) 0.005 % ointment Apply topically Two (2) times a day. To psoriasis. Mix with Clobetasol ointment. 60 g 3   ??? calcium-vitamin D3-vitamin K 500-100-40 mg-unit-mcg Chew Chew.     ??? cholecalciferol, vitamin D3-50 mcg, 2,000 unit,, 50 mcg (2,000 unit) cap Take 3 capsules daily for 3 months, then reduce to 1 capsule daily thereafter for Vitamin D Deficiency.     ??? clobetasoL (TEMOVATE) 0.05 % ointment Apply twice a day to affected areas of body (red/raised/itchy) until clear/smooth. Avoid face and folds. 60 g 1   ??? desonide (DESOWEN) 0.05 % ointment      ??? fludrocortisone (FLORINEF) 0.1 mg tablet      ??? insulin syringe-needle U-100 1 mL 27 gauge x 5/8 Syrg 1 Syringe.     ??? levothyroxine (SYNTHROID) 75 MCG tablet Take 1 tablet by mouth every morning 30 to 60 minutes before breakfast.     ??? lidocaine (LIDODERM) 5 % patch      ??? lidocaine 2% mucosal gel (XYLOCAINE) 2 % jelly Apply topically every hour as needed.     ??? methocarbamoL (ROBAXIN) 500 MG tablet      ??? metoclopramide (REGLAN) 5 MG tablet      ??? NURTEC ODT 75 mg TbDL      ??? onabotulinumtoxinA (BOTOX) 200 unit SolR injection Inject 1 Dose as directed every 3 (three) months.     ??? pediatric multivitamin Chew tablet Chew 1 tablet daily.     ??? pen needle, diabetic 32 gauge x 5/32 (4 mm) Ndle 1 each.     ??? phentermine (ADIPEX-P) 37.5 mg tablet Take 37.5 mg by mouth every other day.     ??? pregabalin (LYRICA) 50 MG capsule Take 50 mg by mouth.     ??? pregabalin (LYRICA) 50 MG capsule      ??? SAXENDA 3 mg/0.5 mL (18 mg/3 mL) injection pen      ??? valACYclovir (VALTREX) 1000 MG tablet  (Patient not taking: Reported on 12/25/2020)       No current facility-administered medications for this visit.       Allergies   Allergen Reactions   ??? Hydroxychloroquine Other (See Comments)     Prolonged QT  Other reaction(s): Unknown   ??? Ondansetron Hcl Other (See Comments)     Prolonged QT  Prolonged QT   ??? Latex Dermatitis       Patient Active Problem List   Diagnosis   ??? Large breasts   ??? Paroxysmal SVT (supraventricular tachycardia) (CMS-HCC)   ??? POTS (postural orthostatic tachycardia syndrome)   ??? Rh negative state in antepartum period   ??? Sjogren's syndrome (CMS-HCC)   ??? Undifferentiated connective tissue disease (CMS-HCC)   ??? Raynaud's disease without gangrene   ??? Psoriatic arthritis (CMS-HCC)       Reviewed and up to date in Epic.    Appropriateness of Therapy     Is medication and dose appropriate based on diagnosis? Yes    Prescription has been clinically reviewed: Yes    Baseline Quality of Life Assessment      How many days over the past month did  your ps/psa  keep you from your normal activities? For example, brushing your teeth or getting up in the morning. Patient declined to answer    Financial Information     Medication Assistance provided: Prior Authorization    Anticipated copay of $0 reviewed with patient. Verified delivery address.    Delivery Information     Scheduled delivery date: Thurs, 3/24    Expected start date: Thurs, 3/24    Medication will be delivered via UPS to the prescription address in [facility].  This shipment will not require a signature.      Explained the services we provide at [facility] and that each month we would call to set up refills.  Stressed importance of returning phone calls so that we could ensure they receive their medications in time each month.  Informed patient that we should be setting up refills 7-10 days prior to when they will run out of medication.  A pharmacist will reach out to perform a clinical assessment periodically.  Informed patient that a welcome packet, containing information about our pharmacy and other support services, a Notice of Privacy Practices, and a drug information handout will be sent.      Patient verbalized understanding of the above information as well as how to contact the pharmacy at [phone] option 4 with any questions/concerns.  The pharmacy is open Monday through Friday 8:30am-4:30pm.  A pharmacist is available 24/7 via pager to answer any clinical questions they may have.    Patient Specific Needs     - Does the patient have any physical, cognitive, or cultural barriers? No    - Patient prefers to have medications discussed with  Patient     - Is the patient or caregiver able to read and understand education materials at a high school level or above? Yes    - Patient's primary language is  English     - Is the patient high risk? No    - Does the patient require a Care Management Plan? No     - Does the patient require physician intervention or other additional services (i.e. nutrition, smoking cessation, social work)? No      Marcell Chavarin A Desiree [facility] Specialty Pharmacist

## 2021-03-19 DIAGNOSIS — N3090 Cystitis, unspecified without hematuria: Principal | ICD-10-CM

## 2021-03-19 DIAGNOSIS — R399 Unspecified symptoms and signs involving the genitourinary system: Principal | ICD-10-CM

## 2021-03-19 LAB — CBC W/ AUTO DIFF
BASOPHILS ABSOLUTE COUNT: 0 10*9/L (ref 0.0–0.1)
BASOPHILS RELATIVE PERCENT: 0.5 %
EOSINOPHILS ABSOLUTE COUNT: 0.1 10*9/L (ref 0.0–0.5)
EOSINOPHILS RELATIVE PERCENT: 0.6 %
HEMATOCRIT: 39.5 % (ref 34.0–44.0)
HEMOGLOBIN: 13.6 g/dL (ref 11.3–14.9)
LYMPHOCYTES ABSOLUTE COUNT: 2.9 10*9/L (ref 1.1–3.6)
LYMPHOCYTES RELATIVE PERCENT: 33 %
MEAN CORPUSCULAR HEMOGLOBIN CONC: 34.5 g/dL (ref 32.0–36.0)
MEAN CORPUSCULAR HEMOGLOBIN: 28.2 pg (ref 25.9–32.4)
MEAN CORPUSCULAR VOLUME: 81.8 fL (ref 77.6–95.7)
MEAN PLATELET VOLUME: 8.9 fL (ref 6.8–10.7)
MONOCYTES ABSOLUTE COUNT: 0.7 10*9/L (ref 0.3–0.8)
MONOCYTES RELATIVE PERCENT: 8.6 %
NEUTROPHILS ABSOLUTE COUNT: 5 10*9/L (ref 1.8–7.8)
NEUTROPHILS RELATIVE PERCENT: 57.3 %
NUCLEATED RED BLOOD CELLS: 0 /100{WBCs} (ref <=4)
PLATELET COUNT: 268 10*9/L (ref 150–450)
RED BLOOD CELL COUNT: 4.83 10*12/L (ref 3.95–5.13)
RED CELL DISTRIBUTION WIDTH: 13.7 % (ref 12.2–15.2)
WBC ADJUSTED: 8.7 10*9/L (ref 3.6–11.2)

## 2021-03-19 LAB — BASIC METABOLIC PANEL
ANION GAP: 8 mmol/L (ref 5–14)
BLOOD UREA NITROGEN: <5 mg/dL — ABNORMAL LOW (ref 9–23)
CALCIUM: 9.8 mg/dL (ref 8.7–10.4)
CHLORIDE: 105 mmol/L (ref 98–107)
CO2: 25.7 mmol/L (ref 20.0–31.0)
CREATININE: 0.7 mg/dL
EGFR CKD-EPI AA FEMALE: >90 mL/min/{1.73_m2} (ref >=60)
EGFR CKD-EPI NON-AA FEMALE: >90 mL/min/{1.73_m2} (ref >=60)
GLUCOSE RANDOM: 99 mg/dL (ref 70–179)
POTASSIUM: 3.3 mmol/L — ABNORMAL LOW (ref 3.4–4.8)
SODIUM: 139 mmol/L (ref 135–145)

## 2021-03-19 LAB — PROTIME-INR
INR: 1.12
PROTIME: 13.1 s (ref 10.3–13.4)

## 2021-03-19 LAB — APTT
APTT: 32.6 s (ref 24.9–36.9)
HEPARIN CORRELATION: 0.2

## 2021-03-19 LAB — LACTATE, VENOUS, WHOLE BLOOD
LACTATE BLOOD VENOUS: 1.7 mmol/L (ref 0.5–1.8)
LACTATE BLOOD VENOUS: 3.4 mmol/L — ABNORMAL HIGH (ref 0.5–1.8)

## 2021-03-19 MED ORDER — NITROFURANTOIN MONOHYDRATE/MACROCRYSTALS 100 MG CAPSULE
ORAL_CAPSULE | Freq: Two times a day (BID) | ORAL | 0 refills | 5.00000 days | Status: CP
Start: 2021-03-19 — End: 2021-03-24

## 2021-03-20 ENCOUNTER — Encounter
Admit: 2021-03-20 | Discharge: 2021-03-20 | Disposition: A | Discharge status: Home / Self Care | Payer: PRIVATE HEALTH INSURANCE | Attending: Emergency Medicine

## 2021-03-20 ENCOUNTER — Ambulatory Visit
Admit: 2021-03-19 | Discharge: 2021-03-20 | Disposition: A | Discharge status: Home / Self Care | Payer: PRIVATE HEALTH INSURANCE | Attending: Emergency Medicine | Primary: Emergency Medicine

## 2021-03-20 MED ORDER — CEFDINIR 300 MG CAPSULE
ORAL_CAPSULE | Freq: Every day | ORAL | 0 refills | 7 days | Status: CP
Start: 2021-03-20 — End: 2021-03-27

## 2021-03-20 MED ADMIN — lactated ringers bolus 1,000 mL: 1000 mL | INTRAVENOUS | @ 02:00:00 | Stop: 2021-03-19

## 2021-03-20 MED ADMIN — lactated ringers bolus 1,000 mL: 1000 mL | INTRAVENOUS | @ 05:00:00 | Stop: 2021-03-20

## 2021-03-20 MED ADMIN — cefTRIAXone (ROCEPHIN) 1 g in sodium chloride 0.9 % (NS) 100 mL IVPB-connector bag: 1 g | INTRAVENOUS | @ 05:00:00 | Stop: 2021-03-20

## 2021-03-20 MED ADMIN — lactated ringers bolus 1,000 mL: 1000 mL | INTRAVENOUS | @ 03:00:00 | Stop: 2021-03-19

## 2021-03-20 NOTE — Unmapped (Signed)
Patient has not been able to eat for a week. No vomiting or diarrhea. Extreme nausea. Was seen at [facility] and sent here.

## 2021-03-20 NOTE — Unmapped (Signed)
[facility]      [address], [place], [place]  Subjective:     Triage Notes:  Chief Complaint   Patient presents with   ??? Dysuria     burning and odor x 1wk       HPI: Allison Davenport is a pleasant 35 y.o. female paroxysmal SVT, POTS, surgeons, Raynaud's disease, psoriatic arthritis, history of bladder retention and intermittent self-cath here for possible UTI.    The patient reports onset of malodorous urine, burning with urination, lower abdominal pains over the last week.  Continues with daily intermittent self cathing.    Denies associated fevers, back pain, hematuria.     No medications taken prior to arrival for her symptoms.  The patient is status post hysterectomy.  Denies any other prior abdominal surgeries.  Notes a history of ovarian cysts.    The patient has a history of urinary tract infections.  Last UTI was 11/18/2020.  Reviewed previous urine testing and culture data available in the chart, urine culture from 11/18/2020 grew E. coli resistant to ampicillin only.  Was treated with Macrobid at that time.  No other positive culture data available.    I have reviewed past medical, surgical, medications, allergies, social and family histories today.      Current Outpatient Medications:   ???  buPROPion (WELLBUTRIN XL) 150 MG 24 hr tablet, Take 150 mg by mouth daily., Disp: , Rfl:   ???  phentermine (ADIPEX-P) 37.5 mg tablet, Take 37.5 mg by mouth every other day., Disp: , Rfl:   ???  acetaminophen (TYLENOL) 500 MG tablet, Take 1,000 mg by mouth daily as needed., Disp: , Rfl:   ???  albuterol HFA 90 mcg/actuation inhaler, Inhale 1 puff daily as needed., Disp: , Rfl:   ???  apremilast (OTEZLA STARTER) 10 mg (4)-20 mg (4)-30 mg (47) DsPk, Take as directed on package., Disp: 55 tablet, Rfl: 0  ???  apremilast 30 mg Tab, Take 1 tablet (30 mg total) by mouth Two (2) times a day (Maintenance Dose), Disp: 60 tablet, Rfl: 11  ???  calcipotriene (DOVONOX) 0.005 % ointment, Apply topically Two (2) times a day. To psoriasis. Mix with Clobetasol ointment., Disp: 60 g, Rfl: 3  ???  calcium-vitamin D3-vitamin K 500-100-40 mg-unit-mcg Chew, Chew., Disp: , Rfl:   ???  cholecalciferol, vitamin D3-50 mcg, 2,000 unit,, 50 mcg (2,000 unit) cap, Take 3 capsules daily for 3 months, then reduce to 1 capsule daily thereafter for Vitamin D Deficiency., Disp: , Rfl:   ???  clobetasoL (TEMOVATE) 0.05 % ointment, Apply twice a day to affected areas of body (red/raised/itchy) until clear/smooth. Avoid face and folds., Disp: 60 g, Rfl: 1  ???  desonide (DESOWEN) 0.05 % ointment, , Disp: , Rfl:   ???  fludrocortisone (FLORINEF) 0.1 mg tablet, , Disp: , Rfl:   ???  insulin syringe-needle U-100 1 mL 27 gauge x 5/8 Syrg, 1 Syringe., Disp: , Rfl:   ???  levothyroxine (SYNTHROID) 75 MCG tablet, Take 1 tablet by mouth every morning 30 to 60 minutes before breakfast., Disp: , Rfl:   ???  lidocaine (LIDODERM) 5 % patch, , Disp: , Rfl:   ???  lidocaine 2% mucosal gel (XYLOCAINE) 2 % jelly, Apply topically every hour as needed., Disp: , Rfl:   ???  methocarbamoL (ROBAXIN) 500 MG tablet, , Disp: , Rfl:   ???  metoclopramide (REGLAN) 5 MG tablet, , Disp: , Rfl:   ???  nitrofurantoin, macrocrystal-monohydrate, (MACROBID) 100 MG  capsule, Take 1 capsule (100 mg total) by mouth Two (2) times a day for 5 days., Disp: 10 capsule, Rfl: 0  ???  NURTEC ODT 75 mg TbDL, , Disp: , Rfl:   ???  onabotulinumtoxinA (BOTOX) 200 unit SolR injection, Inject 1 Dose as directed every 3 (three) months., Disp: , Rfl:   ???  pediatric multivitamin Chew tablet, Chew 1 tablet daily., Disp: , Rfl:   ???  pen needle, diabetic 32 gauge x 5/32 (4 mm) Ndle, 1 each., Disp: , Rfl:   ???  pregabalin (LYRICA) 50 MG capsule, Take 50 mg by mouth., Disp: , Rfl:   ???  pregabalin (LYRICA) 50 MG capsule, , Disp: , Rfl:   ???  SAXENDA 3 mg/0.5 mL (18 mg/3 mL) injection pen, , Disp: , Rfl:   ???  valACYclovir (VALTREX) 1000 MG tablet, , Disp: , Rfl:      ROS:     Review of Systems    Review of systems negative unless otherwise noted as per HPI.    Objective:     Vitals:    03/19/21 1939   BP: 134/86   Pulse: 83   Resp: 18   Temp: 36.7 ??C (98.1 ??F)   SpO2: 98%   Height: 160 cm (5' 3)       Body mass index is 29.23 kg/m??.    Physical Exam  Vitals reviewed.   Constitutional:       General: She is not in acute distress.     Appearance: Normal appearance. She is not ill-appearing or diaphoretic.   HENT:      Head: Normocephalic and atraumatic.   Eyes:      General: No scleral icterus.     Conjunctiva/sclera: Conjunctivae normal.   Cardiovascular:      Rate and Rhythm: Normal rate and regular rhythm.      Heart sounds: Normal heart sounds.   Pulmonary:      Effort: Pulmonary effort is normal. No respiratory distress.      Breath sounds: Normal breath sounds.   Abdominal:      General: Bowel sounds are normal. There is no distension.      Palpations: Abdomen is soft.      Tenderness: There is abdominal tenderness (  Suprapubic) in the right upper quadrant and suprapubic area. There is no right CVA tenderness, left CVA tenderness or guarding. Negative signs include Murphy's sign.   Skin:     General: Skin is warm and dry.   Neurological:      General: No focal deficit present.      Mental Status: She is alert and oriented to person, place, and time.   Psychiatric:         Mood and Affect: Mood normal.         Behavior: Behavior normal.            Assessment and Plan:     Differential Diagnosis: Differential diagnosis includes, but is not limited to: UTI, pyelonephritis, sepsis, STI, skin defect.    Patient presenting with dysuria and malodorous urine over the last week, history of intermittent self cathing and UTI.  Urine dip is concerning for infection with trace leuks and positive nitrites, will send culture and initiate empiric treatment with Macrobid.  Patient additionally reported she has been feeling unwell the last several days and has had poor appetite, ate a donut today, not much else.  Discussed with patient her urine shows ketonuria which suggest some dehydration and  we would recommend oral rehydration strategies.  Her abdomen was soft without guarding, however she did indicate tenderness in the suprapubic as well as right upper quadrant area.  Negative Murphy sign.  Reports that she is being followed by rheumatologist and treated for POTS and some of her medications have been titrated lately.  Advised patient that if she continues to feel worse rather than better despite treatment with antibiotics for UTI, should seek additional evaluation.    Victorino Dike was seen today for dysuria.    Diagnoses and all orders for this visit:    Urinary symptom or sign  -     POCT urinalysis dipstick  -     Urine Culture    Cystitis  -     nitrofurantoin, macrocrystal-monohydrate, (MACROBID) 100 MG capsule; Take 1 capsule (100 mg total) by mouth Two (2) times a day for 5 days.         Results    Results for orders placed or performed in visit on 03/19/21   POCT urinalysis dipstick   Result Value Ref Range    Color, UA Yellow     Clarity, UA Cloudy     Glucose, UA Negative Negative    Bilirubin, UA Negative Negative    Ketones, POC 40 mg/dL Negative    Spec Grav, UA 1.020 1.005 - 1.030    Blood, UA Negative Negative    pH, UA 6.0 5.0 - 9.0    Protein, UA Negative Negative    Urobilinogen, UA 0.2 mg/dL Negative (0.2 mg/dL)    Leukocytes, UA Trace Negative    Nitrite, UA Positive Negative    STRIP LOT NUMBER [id number]     STRIP LOT EXPIRATION 06/21/21      PHQ-2 Score: 6    PHQ-9 Score: 21    Edinburgh Score:      Screening complete, depression identified / today's follow-up action documented in note   Patient reports she has good support for her mental health.  No SI today.    Tobacco Use: Low Risk    ??? Smoking Tobacco Use: Never Smoker   ??? Smokeless Tobacco Use: Never Used     No SDOH tobacco intervention needed today.    Discussed my evaluation, clinical impression, and discharge plan. Also discussed anticipatory guidance and return precautions. Patient expressed understanding, has no questions or concerns at this time.       Note - This record has been created using [employer]. Chart creation errors have been sought, but may not always have been located. Such creation errors do not reflect on the standard of medical care.    Roylene Reason, PA-C  [facility]

## 2021-03-20 NOTE — Unmapped (Signed)
[facility] Emergency Department Provider Note      ED Course, Assessment and Plan     Initial Clinical Impression:    March 19, 2021 9:30 PM   Allison Davenport is a 35 y.o. female with a PMH of psoriatic arthritis, Sjogren's syndrome, Ehlers-Danlos syndrome, paroxysmal SVT, POTS, systemic lupus erythematosus, Hashimoto's disease, anemia, asthma, and hypercholesteremia who presents to the ED for 1 week of reduced PO intake 2/2 nausea and abdominal pain with associated that acutely worsened 3 days ago as described below.     On exam, vital signs significant for tachycardia to tachycardia 161 that improved to low 100s without fluid resuscitation and 60s with 2 L IV fluid.  Slightly dry mucous membranes.  Normal neurologic exam.  Mild suprapubic tenderness.  No abdominal tenderness palpation otherwise.  No CVA tenderness.    BP 104/80  - Pulse 67  - Temp 37.1 ??C (98.8 ??F) (Oral)  - Resp 17  - Ht 160 cm (5' 3)  - Wt 71.2 kg (157 lb)  - SpO2 98%  - BMI 27.81 kg/m??     Patient has dysautonomia, multiple rheumatologic disorders, and has had several recent medication changes.  Here today, she is presenting primarily with tachycardia and generally feeling unwell.  At the time my evaluation, she had 1.5 L of IV fluid and was feeling much improved.  She has mild suprapubic tenderness and had a positive UA at urgent care.  They had placed her on Macrobid.  However, given her elevated lactate at 3.4 that demonstrated a 1.6 I do feel that she warrants treatment for concern for pyelonephritis.  Other lab work is reassuring.  She was able to tolerate p.o. here in the emergency department and was feeling much better at the time of discharge.  For admission following up with her outpatient neurologist.  I suspect her symptoms are related to medication changes in her chronic medical issues    Discussed treatment plan and return precautions extensively. Patient voiced a clear understanding and was amenable with the plan.    Pt stable for discharge           _____________________________________________________________________    The case was discussed with attending physician who is in agreement with the above assessment and plan    Dictation software was used while making this note. Please excuse any errors made with dictation software.    Additional Medical Decision Making     I have reviewed the vital signs and the nursing notes. Labs and radiology results that were available during my care of the patient were independently reviewed by me and considered in my medical decision making.     I independently visualized the EKG tracing if performed  I independently visualized the radiology images if performed  I reviewed the patient's prior medical records if available.  Additional history obtained from family if available    History     CHIEF COMPLAINT: No chief complaint on file.      HPI: Allison Davenport is a 35 y.o. female with a PMH of psoriatic arthritis, Sjogren's syndrome, Ehlers-Danlos syndrome, paroxysmal SVT, POTS, systemic lupus erythematosus, Hashimoto's disease, anemia, asthma, and hypercholesteremia who presents to the ED for evaluation of dizziness. Patient reports 1 week of reduced PO intake 2/2 nausea and abdominal pain with associated that acutely worsened 3 days ago. She additionally endorses dizziness, feeling like she was going to pass out, and foul smelling urine. Patient presented to UC for her symptoms earlier today where  they told her she was dehydrated and discharged her with Macrobid. Of note, patient additionally started Wellbutrin recently, endorsing 1 day of tremors and dropping objects. Patient has had a recent 10 pound weight loss. Further, patient denies fever, chills, cough, chest pain, shortness of breath, vomiting, diarrhea, numbness, tingling, or weakness.    PAST MEDICAL HISTORY/PAST SURGICAL HISTORY:   Past Medical History:   Diagnosis Date   ??? Abnormal ECG     SVT Dysautonomia   ??? Anemia    ??? Arthritis     Psoriatic   ??? Asthma    ??? Brain concussion    ??? Chronic pain syndrome    ??? Disease of thyroid gland     Hashimoto???s   ??? Headache    ??? History of transfusion    ??? Peripheral neuropathy    ??? Pneumonia        Past Surgical History:   Procedure Laterality Date   ??? HYSTERECTOMY         MEDICATIONS:     Current Facility-Administered Medications:   ???  cefTRIAXone (ROCEPHIN) 1 g in sodium chloride 0.9 % (NS) 100 mL IVPB-connector bag, 1 g, Intravenous, Once, Marveen Reeks, MD, Last Rate: 200 mL/hr at 03/20/21 0048, 1 g at 03/20/21 0048  ???  lactated ringers bolus 1,000 mL, 1,000 mL, Intravenous, Once, Harriet Pho, MD    Current Outpatient Medications:   ???  acetaminophen (TYLENOL) 500 MG tablet, Take 1,000 mg by mouth daily as needed., Disp: , Rfl:   ???  albuterol HFA 90 mcg/actuation inhaler, Inhale 1 puff daily as needed., Disp: , Rfl:   ???  apremilast (OTEZLA STARTER) 10 mg (4)-20 mg (4)-30 mg (47) DsPk, Take as directed on package., Disp: 55 tablet, Rfl: 0  ???  apremilast 30 mg Tab, Take 1 tablet (30 mg total) by mouth Two (2) times a day (Maintenance Dose), Disp: 60 tablet, Rfl: 11  ???  buPROPion (WELLBUTRIN XL) 150 MG 24 hr tablet, Take 150 mg by mouth daily., Disp: , Rfl:   ???  calcipotriene (DOVONOX) 0.005 % ointment, Apply topically Two (2) times a day. To psoriasis. Mix with Clobetasol ointment. (Patient not taking: Reported on 03/19/2021), Disp: 60 g, Rfl: 3  ???  calcium-vitamin D3-vitamin K 500-100-40 mg-unit-mcg Chew, Chew., Disp: , Rfl:   ???  cefdinir (OMNICEF) 300 MG capsule, Take 2 capsules (600 mg total) by mouth daily for 7 days., Disp: 14 capsule, Rfl: 0  ???  cholecalciferol, vitamin D3-50 mcg, 2,000 unit,, 50 mcg (2,000 unit) cap, Take 3 capsules daily for 3 months, then reduce to 1 capsule daily thereafter for Vitamin D Deficiency., Disp: , Rfl:   ???  clobetasoL (TEMOVATE) 0.05 % ointment, Apply twice a day to affected areas of body (red/raised/itchy) until clear/smooth. Avoid face and folds., Disp: 60 g, Rfl: 1  ???  desonide (DESOWEN) 0.05 % ointment, , Disp: , Rfl:   ???  fludrocortisone (FLORINEF) 0.1 mg tablet, , Disp: , Rfl:   ???  insulin syringe-needle U-100 1 mL 27 gauge x 5/8 Syrg, 1 Syringe., Disp: , Rfl:   ???  levothyroxine (SYNTHROID) 75 MCG tablet, Take 1 tablet by mouth every morning 30 to 60 minutes before breakfast., Disp: , Rfl:   ???  lidocaine (LIDODERM) 5 % patch, , Disp: , Rfl:   ???  lidocaine 2% mucosal gel (XYLOCAINE) 2 % jelly, Apply topically every hour as needed. (Patient not taking: Reported on 03/19/2021), Disp: , Rfl:   ???  metoclopramide (REGLAN) 5 MG tablet, , Disp: , Rfl:   ???  nitrofurantoin, macrocrystal-monohydrate, (MACROBID) 100 MG capsule, Take 1 capsule (100 mg total) by mouth Two (2) times a day for 5 days., Disp: 10 capsule, Rfl: 0  ???  NURTEC ODT 75 mg TbDL, , Disp: , Rfl:   ???  onabotulinumtoxinA (BOTOX) 200 unit SolR injection, Inject 1 Dose as directed every 3 (three) months., Disp: , Rfl:   ???  pediatric multivitamin Chew tablet, Chew 1 tablet daily., Disp: , Rfl:   ???  pen needle, diabetic 32 gauge x 5/32 (4 mm) Ndle, 1 each., Disp: , Rfl:   ???  phentermine (ADIPEX-P) 37.5 mg tablet, Take 37.5 mg by mouth every other day. (Patient not taking: Reported on 03/19/2021), Disp: , Rfl:   ???  pregabalin (LYRICA) 50 MG capsule, Take 50 mg by mouth., Disp: , Rfl:   ???  pregabalin (LYRICA) 50 MG capsule, , Disp: , Rfl:   ???  SAXENDA 3 mg/0.5 mL (18 mg/3 mL) injection pen, , Disp: , Rfl:     ALLERGIES:   Hydroxychloroquine, Ondansetron hcl, and Latex    SOCIAL HISTORY:   Social History     Tobacco Use   ??? Smoking status: Never Smoker   ??? Smokeless tobacco: Never Used   Substance Use Topics   ??? Alcohol use: Never       FAMILY HISTORY:  Family History   Problem Relation Age of Onset   ??? Multiple sclerosis Mother    ??? Melanoma Mother    ??? Cancer Mother         Melanoma   ??? COPD Mother    ??? Hyperlipidemia Mother    ??? Neuropathy Mother    ??? Osteoporosis Mother    ??? ALS Other    ??? Migraines Father    ??? No Known Problems Sister    ??? No Known Problems Brother    ??? No Known Problems Maternal Aunt    ??? No Known Problems Maternal Uncle    ??? No Known Problems Paternal Aunt    ??? No Known Problems Paternal Uncle    ??? Diabetes Maternal Grandmother    ??? Heart attack Maternal Grandfather    ??? Cancer Paternal Grandmother         Pancreatic   ??? Heart attack Paternal Grandmother    ??? No Known Problems Paternal Grandfather    ??? Anesthesia problems Neg Hx    ??? Broken bones Neg Hx    ??? Cancer Neg Hx    ??? Clotting disorder Neg Hx    ??? Collagen disease Neg Hx    ??? Diabetes Neg Hx    ??? Dislocations Neg Hx    ??? Fibromyalgia Neg Hx    ??? Gout Neg Hx    ??? Hemophilia Neg Hx    ??? Osteoporosis Neg Hx    ??? Rheumatologic disease Neg Hx    ??? Scoliosis Neg Hx    ??? Severe sprains Neg Hx    ??? Sickle cell anemia Neg Hx    ??? Spinal Compression Fracture Neg Hx    ??? Basal cell carcinoma Neg Hx    ??? Squamous cell carcinoma Neg Hx       Review of Systems    A 10 point review of systems was performed and is negative other than positive elements noted in HPI   Constitutional: Negative for fever.  Eyes: Negative for visual changes.  ENT: Negative for sore throat.  Cardiovascular: No chest pain.  Respiratory: Negative for shortness of breath.  Gastrointestinal: Negative for abdominal pain, vomiting or diarrhea.  Genitourinary: Negative for dysuria.  Musculoskeletal: Negative for back pain.  Skin: Negative for rash.  Neurological: Negative for headaches, focal weakness or numbness.    Physical Exam     VITAL SIGNS:    BP 104/80  - Pulse 67  - Temp 37.1 ??C (98.8 ??F) (Oral)  - Resp 17  - Ht 160 cm (5' 3)  - Wt 71.2 kg (157 lb)  - SpO2 98%  - BMI 27.81 kg/m??     Constitutional: Alert and oriented. Well appearing and in no distress.  Eyes: Conjunctivae are normal.  ENT       Head: Normocephalic and atraumatic.       Nose: No congestion.       Mouth/Throat: Mucous membranes are moist.       Neck: No stridor.  Cardiovascular: Normal rate, regular rhythm. 2+ radial pulses equal bilaterally. <2 second cap refill.  Respiratory: Normal respiratory effort. Breath sounds are normal.  Gastrointestinal: Soft and nontender. No rebound tenderness or guarding   Genitourinary: mild suprapubic tenderness  Musculoskeletal: Normal range of motion in all extremities.   Neurologic: Normal speech and language. No gross focal neurologic deficits are appreciated.  Skin: Skin is warm, dry. No rash noted.  Psychiatric: Mood and affect are normal. Speech and behavior are normal.         Radiology     No orders to display     ECG 12 Lead    Result Date: 03/19/2021  NORMAL SINUS RHYTHM CANNOT RULE OUT ANTERIOR INFARCT  , AGE UNDETERMINED ABNORMAL ECG NO PREVIOUS ECGS AVAILABLE          Pertinent labs & imaging results that were available during my care of the patient were reviewed by me and considered in my medical decision making (see chart for details).    Please note- This chart has been created using [employer]. Chart creation errors have been sought, but may not always be located and such creation errors, especially pronoun confusion, do NOT reflect on the standard of medical care.      Documentation assistance was provided by Malena Catholic, Scribe on March 19, 2021 at 10:38 PM for Marveen Reeks, MD.    Documentation assistance was provided by the scribe in my presence.  The documentation recorded by the scribe has been reviewed by me and accurately reflects the services I personally performed.        Marveen Reeks, MD  Resident  03/20/21 [phone]

## 2021-03-20 NOTE — Unmapped (Signed)
Pt called and left VM informing that she has been seen in the ER recently for dehydration and was informed by the ER doctor to have a conversation about her medications. Pt has currently stopped taking [place] and feels much better.

## 2021-03-20 NOTE — Unmapped (Addendum)
It was a pleasure taking care of you today!    You were seen at [facility] for a urinary tract infection.  Please take all of your antibiotics as prescribed, even if you feel better before they are finished.  If you are experiencing pain with urination, for the first 48 hours you can take over-the-counter Azo (or prescription Pyridium), which will improve pain and also turn your urine orange.  Do not take for longer than 48 hours, as we do not want to mask any potential persistent symptoms.    If you develop new back pain, high fevers, or your symptoms are not improving in the next few days after starting antibiotics, please seek medical attention.    If we have decided to send off a urine culture, you will be contacted with results in the next several days.    Urinary Tract Infection (UTI) in Women: Care Instructions  Overview     A urinary tract infection, or UTI, is a general term for an infection anywhere between the kidneys and the urethra (where urine comes out). Most UTIs are bladder infections. They often cause pain or burning when you urinate.  UTIs are caused by bacteria and can be cured with antibiotics. Be sure to complete your treatment so that the infection does not get worse.  Follow-up care is a key part of your treatment and safety. Be sure to make and go to all appointments, and call your doctor if you are having problems. It's also a good idea to know your test results and keep a list of the medicines you take.  How can you care for yourself at home?  ?? Take your antibiotics as directed. Do not stop taking them just because you feel better. You need to take the full course of antibiotics.  ?? Drink extra water and other fluids for the next day or two. This will help make the urine less concentrated and help wash out the bacteria that are causing the infection. (If you have kidney, heart, or liver disease and have to limit fluids, talk with your doctor before you increase the amount of fluids you drink.)  ?? Avoid drinks that are carbonated or have caffeine. They can irritate the bladder.  ?? Urinate often. Try to empty your bladder each time.  ?? To relieve pain, take a hot bath or lay a heating pad set on low over your lower belly or genital area. Never go to sleep with a heating pad in place.  To prevent UTIs  ?? Drink plenty of water each day. This helps you urinate often, which clears bacteria from your system. (If you have kidney, heart, or liver disease and have to limit fluids, talk with your doctor before you increase the amount of fluids you drink.)  ?? Urinate when you need to.  ?? If you are sexually active, urinate right after you have sex.  ?? Change sanitary pads often.  ?? Avoid douches, bubble baths, feminine hygiene sprays, and other feminine hygiene products that have deodorants.  ?? After going to the bathroom, wipe from front to back.  When should you call for help?   Call your doctor now or seek immediate medical care if:  ?? ?? Symptoms such as fever, chills, nausea, or vomiting get worse or appear for the first time.   ?? ?? You have new pain in your back just below your rib cage. This is called flank pain.   ?? ?? There is new  blood or pus in your urine.   ?? ?? You have any problems with your antibiotic medicine.   Watch closely for changes in your health, and be sure to contact your doctor if:  ?? ?? You are not getting better after taking an antibiotic for 2 days.   ?? ?? Your symptoms go away but then come back.   Where can you learn more?  Go to [facility] at [email or web address] [email or web address] in the Menu. Enter [phone] in the search box to learn more about Urinary Tract Infection (UTI) in Women: Care Instructions.  Current as of: October 09, 2020??????????????????????????????Content Version: 13.2  ?? 2006-2022 [employer].   Care instructions adapted under license by [facility]. If you have questions about a medical condition or this instruction, always ask your healthcare professional. [employer] disclaims any warranty or liability for your use of this information.

## 2021-03-21 NOTE — Unmapped (Unsigned)
Rheumatology Clinic Follow-up Visit Note     Assessment and Plan: Allison Davenport is a 35 y.o.  female with a PMH of hypothyroidism, POTS, hypermobility, shingles, neurogenic bladder, history of UCTD (+ ANA, polyarthralgia, fatigue, facial rash, sicca symptoms, Raynauds and pleurisy?, dx at [facility]) being seen in follow-up for mild psoriatic arthritis with psoriasiform rash and nail changes. She is currently on Otezla 30mg  daily with improvement in her rash and joint symptoms. Given GI symptoms on the higher dosing she will continue 30mg  daily for now. We also re-discussed seeing ENT to clarify if she has Sjogren's syndrome with ongoing dry eyes and dry mouth. I do suspect some of her joint pain is also hypermobility related. She will be seeing an orthopedist about her R knee which has instability.      Nail pitting, psoriasaform skin rash, arthralgias - mild psoriatic arthritis:   - Continue Otezla 30mg  daily, did not tolerate 30mg  BID due to diarrhea   - Seen by [facility] dermatology   - MRI C/T/L/Sacroiliac reviewed previously - no evidence of axial inflammatory disease     History of UCTD (+ ANA, polyarthralgia, prior malar rash, sicca symptoms, Raynaud's, history of pleurisy?) dx at [facility]:  - Referral to ENT for minor salivary lip gland biopsy to confirm if she has Sjogren's- encouraged to reschedule   - Off HCQ due to prolonged QTC and off MTX due to recurrent infections   - Prior ENA and dsDNA reviewed - negative   - UA from 10/2020 reviewed, no blood or protein, prior C3/C4 reviewed and normal.   - CBC and BMP from 02/2021 reviewed      Hypermobility with R knee instability:   - She will be seeing an orthopedics provider     Return in about 5 months (around 08/26/2021).    Janae Bridgeman, MD  [facility] Rheumatology   Pager [phone]    HPI: Allison Davenport is a 35 y.o.  female with a PMH of hypothyroidism, POTS, hypermobility, shingles, neurogenic bladder, UCTD (+ ANA, polyarthralgia, fatigue, facial rash, sicca symptoms, Raynauds and pleurisy?) being seen in follow-up for mild psoriatic arthritis with psoriasiform rash and nail changes.     Disease history: She was first seen at [facility] in Jan 2022. She had been followed at [facility] for UCTD with polyarthralgia, fatigue, facial rash, sicca symptoms, Raynauds and pleurisy with + ANA but no lupus specific serologies. She has been treated with HCQ and MTX 15mg  weekly. She also had improvement with MTX but had infection such as UTI, shingles, coxsackie virus.  She also has been noted to be hypermobile and notes it is worst at her R hip. She has since had C/T/L spine and SI MRIS without axial inflammatory disease.     In 2021 she developed finger nail pitting starting with oncolysis as well. She also had a psoriasiform rash on her left upper arm/shoulder. She developed morning stiffness of the hands and left wrist, with finger sweling without redness. She also had toe swelling and pain.     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 She was started on Otezla earlier in March but had severe GI symptoms leading to dehydration. She was also diagnosed with a UTI at the time. She is currently just on Otezla 30mg  once a day instead of twice a day. [facility] [place] has helped with her her skin and back pain, as well as peripheral  joint pain. She does note her R knee has given out and feels unstable. She still has fatigue.     ROS: 14 systems were reviewed and are negative except for that mentioned in the HPI.     Allergies: Hydroxychloroquine, Ondansetron hcl, and Latex     PMHx:   Past Medical History:   Diagnosis Date   ??? Abnormal ECG     SVT Dysautonomia   ??? Anemia    ??? Arthritis     Psoriatic   ??? Asthma    ??? Brain concussion    ??? Chronic pain syndrome    ??? Disease of thyroid gland     Hashimoto???s   ??? Headache    ??? History of transfusion    ??? Peripheral neuropathy    ??? Pneumonia      PSHx:   Past Surgical History:   Procedure Laterality Date   ??? HYSTERECTOMY        Family Hx: Reviewed in Epic     Social Hx: Never smoker     Medications:   Current Outpatient Medications   Medication Sig Dispense Refill   ??? acetaminophen (TYLENOL) 500 MG tablet Take 1,000 mg by mouth daily as needed.     ??? albuterol HFA 90 mcg/actuation inhaler Inhale 1 puff daily as needed.     ??? apremilast 30 mg Tab Take 1 tablet (30 mg total) by mouth Two (2) times a day (Maintenance Dose) 60 tablet 11   ??? buPROPion (WELLBUTRIN XL) 150 MG 24 hr tablet Take 150 mg by mouth daily.     ??? calcium-vitamin D3-vitamin K 500-100-40 mg-unit-mcg [employer] daily.      ??? cefdinir (OMNICEF) 300 MG capsule Take 2 capsules (600 mg total) by mouth daily for 7 days. 14 capsule 0   ??? cholecalciferol, vitamin D3-50 mcg, 2,000 unit,, 50 mcg (2,000 unit) cap 50 mcg daily.      ??? clobetasoL (TEMOVATE) 0.05 % ointment Apply twice a day to affected areas of body (red/raised/itchy) until clear/smooth. Avoid face and folds. 60 g 1   ??? desonide (DESOWEN) 0.05 % ointment Take as directed.      ??? levothyroxine (SYNTHROID) 75 MCG tablet Take 1 tablet by mouth every morning 30 to 60 minutes before breakfast.     ??? metoclopramide (REGLAN) 5 MG tablet Take as directed.      ??? nitrofurantoin, macrocrystal-monohydrate, (MACROBID) 100 MG capsule Take 1 capsule (100 mg total) by mouth Two (2) times a day for 5 days. (Patient taking differently: Take 100 mg by mouth Take as directed. ) 10 capsule 0   ??? NURTEC ODT 75 mg TbDL 75 mg every other day.      ??? onabotulinumtoxinA (BOTOX) 200 unit SolR injection Inject 1 Dose as directed every 3 (three) months.     ??? pediatric multivitamin Chew tablet Chew 1 tablet daily.     ??? pregabalin (LYRICA) 50 MG capsule Take 25 mg by mouth Two (2) times a day.      ??? pregabalin (LYRICA) 50 MG capsule 50 mg Two (2) times a day.      ??? apremilast (OTEZLA STARTER) 10 mg (4)-20 mg (4)-30 mg (47) DsPk Take as directed on package. (Patient not taking: Reported on 03/26/2021) 55 tablet 0   ??? calcipotriene (DOVONOX) 0.005 % ointment Apply topically Two (2) times a day. To psoriasis. Mix with Clobetasol ointment. (Patient not taking: Reported on 03/19/2021) 60 g 3     No current facility-administered  medications for this visit.     Physical Exam:    Vitals:    03/26/21 0932   BP: 132/83   BP Site: L Arm   BP Position: Sitting   BP Cuff Size: Medium   Pulse: 114   Temp: 36.2 ??C (97.1 ??F)   Weight: 76.4 kg (168 lb 6.4 oz)     GEN:  In no acute distress.  Well nourished and well kempt.  HEENT: No alopecia. EOMI. PERRL. No conjunctival injection.  MSK:     Hands: Non-tender. No swelling.  Full ROM. Able to make fist. 5th digits extend to about 90 degrees.    Wrists: No wrist swelling, surgical incision on L dorsal wrist. Can touch forearms with thumbs.    Elbows: Non-tender. No swelling. Full ROM. No hypermobility.    Shoulders: Non-tender. Full ROM.    Back: Tender to palpation in lower back. Can almost touch floor with palms from standing, 1cm away.    Knees: Non-tender. No swelling. Full ROM. No hypermobility.    Ankles: Non-tender. No swelling.  Neuro:  AAO x 3.  No focal neurologic deficits. Strength 5/5 bilaterally in upper and lower extremities.  Skin:  Faint patches of erythema with scale over the left shoulder, much improved.  Bilateral thumb nail pitting previously, fake nail on today.   Psych: Normal mood and affect. Normal thought process.    Labs:    Lab Results   Component Value Date    WBC 8.7 03/19/2021    HGB 13.6 03/19/2021    HCT 39.5 03/19/2021    PLT 268 03/19/2021     Lab Results   Component Value Date    NA 139 03/19/2021    K 3.3 (L) 03/19/2021    CL 105 03/19/2021    CO2 25.7 03/19/2021    BUN <5 (L) 03/19/2021    CREATININE 0.70 03/19/2021    GLU 99 03/19/2021    CALCIUM 9.8 03/19/2021     Lab Results   Component Value Date    BILITOT 0.4 12/25/2020    PROT 6.9 12/25/2020    ALBUMIN 4.3 12/25/2020    ALT 18 12/25/2020 AST 10 12/25/2020    ALKPHOS 61 12/25/2020     UA 10/2020 - no blood or protein     ANA +  ENA negative  dsDNA negative   C3 and C4 normal 06/2020  ESR and CRP normal 03/2020     RF and CCP negative 2020     Hep C and Hep B Core Ab and Surface Ag negative     TSH normal     Left shoulder, punch 02/2021:  - Psoriasiform epidermal hyperplasia, spongiosis and 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 Left thigh, skin biopsy: skin with normal epidermal nerve fiber density.  B. Left calf, skin biopsy: skin with normal epidermal nerve fiber density     Imaging:    MRI L wrist 09/2020:  As clinically questioned, there is a small ganglion along the dorsum of the   carpus measuring 1 cm in maximal dimension.      MRI SI joints 07/2020: Normal SI joints.  MRI L Spine 07/2020: Mild L4-L5 posterior disc herniation without stenosis   MRI C/T Spine 09/2020: Normal MR of the cervical and thoracic spine with normal cord signal and no   relevant disc disease.     L foot xray 08/2020:   Metatarsophalangeal joints midtarsal  joints and subtalar joints are all   well-maintained. ??Raised hallux does not reveal fracture of the distal   phalanx or large periosteal bone reaction.     L knee xray 06/2020:   Xrays revealed no lesion or   defect no degenerative changes or osteophyte formation. ??The patella shows   good central tracking on the AP and lateral view. ??Patella femoral joint   appears to be normal with no lateral translation bilaterally.       Left hand and wrist [place] in clinic previously:    Left dorsal wrist long - trace tendon sheath effusion with negative CPD signal, no effusion         Left 4th PIP long with small osteophyte - no effusion/synovitis:        Left 2nd PIP long with small osteophyte - no effusion or synovitis: distal   phalanx or large periosteal bone reaction.     L knee xray 06/2020:   Xrays revealed no lesion or   defect no degenerative changes or osteophyte formation. ??The patella shows   good central tracking on the AP and lateral view. ??Patella femoral joint   appears to be normal with no lateral translation bilaterally.       Left hand and wrist [place] in clinic today:     Left dorsal wrist long - trace tendon sheath effusion with negative CPD signal, no effusion         Left 4th PIP long with small osteophyte - no effusion/synovitis:        Left 2nd PIP long with small osteophyte - no effusion or synovitis:

## 2021-03-26 ENCOUNTER — Encounter: Admit: 2021-03-26 | Discharge: 2021-03-27 | Payer: PRIVATE HEALTH INSURANCE

## 2021-03-26 DIAGNOSIS — L4050 Arthropathic psoriasis, unspecified: Principal | ICD-10-CM

## 2021-03-26 NOTE — Unmapped (Signed)
Continue Otezla 30mg  daily. If you have a flare we can consider increasing to 30mg  twice a day.     For lip gland biopsy for Sjogren's evaluation please contact ENT:   [place] (ENT) [address] [place]  [phone]

## 2021-03-27 ENCOUNTER — Encounter: Admit: 2021-03-27 | Discharge: 2021-03-28 | Payer: PRIVATE HEALTH INSURANCE

## 2021-03-27 NOTE — Unmapped (Signed)
ORTHOPAEDIC NEW CLINIC NOTE     ASSESSMENT:  Allison Davenport is a 35 y.o. female  with:  1. Right knee pain in the setting of Ehlers-Danlos Syndrome    PLAN:  We discussed with her that given her mechanical symptoms as well as medial based pain, we will obtain an MRI to evaluate for medial meniscus tear.  We will arrange for a phone visit afterwards to discuss the results.  Patient agreed with this plan, all questions answered.  - Follow-up plan: Phone visit  - X-rays to be ordered next visit: None    SUBJECTIVE:  Chief Complaint:   Right knee pain    History of Present Illness:   35 y.o.  female with a past history of Ehlers-Danlos syndrome, who presents today for new patient evaluation of right knee pain.  The patient states she has had right knee pain since she was 35 years old due to her hypermobility.  Her knee pain has been off and on since then however but has been worse in the last couple months.  She states it is mostly on the medial side and is mostly painful when ambulating or resting for long periods of time.  She states it gives out frequently when ambulating.  She also describes an episode 2 weeks ago where she had her leg crossed and got locked in flexion, she required help by her husband to straighten her knee and felt a pop when it straightened.  She did physical therapy approximately 1 year ago and has been doing home exercises daily.  She wears a brace at times which minimally helps.  She takes Tylenol and naproxen daily.  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      Medical History  Past Medical History:   Diagnosis Date   ??? Abnormal ECG     SVT Dysautonomia   ??? Anemia    ??? Arthritis     Psoriatic   ??? Asthma    ??? Brain concussion    ??? Chronic pain syndrome    ??? Disease of thyroid gland     Hashimoto???s   ??? Headache    ??? History of transfusion    ??? Peripheral neuropathy    ??? Pneumonia     Surgical History  Past Surgical History:   Procedure Laterality Date   ??? HYSTERECTOMY      Medications  has a current medication list which includes the following prescription(s): acetaminophen, albuterol, otezla starter, apremilast, bupropion, calcipotriene, calcium-vitamin d3-vitamin k, cefdinir, cholecalciferol (vitamin d3-50 mcg (2,000 unit)), clobetasol, desonide, levothyroxine, metoclopramide, nurtec odt, onabotulinumtoxina, pediatric multivitamin, pregabalin, and pregabalin.   Tobacco/Alcohol History  Social History     Tobacco Use   Smoking Status Never Smoker   Smokeless Tobacco Never Used     Social History     Substance and Sexual Activity   Alcohol Use Never    Social History        Occupational History   ??? Not on file       Home Address:  [address]  [place] [address] Family History  Family History   Problem Relation Age of Onset   ??? Multiple sclerosis Mother    ??? Melanoma Mother    ??? Cancer Mother         Melanoma   ??? COPD Mother    ??? Hyperlipidemia Mother    ???  Neuropathy Mother    ??? Osteoporosis Mother    ??? ALS Other    ??? Migraines Father    ??? No Known Problems Sister    ??? No Known Problems Brother    ??? No Known Problems Maternal Aunt    ??? No Known Problems Maternal Uncle    ??? No Known Problems Paternal Aunt    ??? No Known Problems Paternal Uncle    ??? Diabetes Maternal Grandmother    ??? Heart attack Maternal Grandfather    ??? Cancer Paternal Grandmother         Pancreatic   ??? Heart attack Paternal Grandmother    ??? No Known Problems Paternal Grandfather    ??? Anesthesia problems Neg Hx    ??? Broken bones Neg Hx    ??? Cancer Neg Hx    ??? Clotting disorder Neg Hx    ??? Collagen disease Neg Hx    ??? Diabetes Neg Hx    ??? Dislocations Neg Hx    ??? Fibromyalgia Neg Hx    ??? Gout Neg Hx    ??? Hemophilia Neg Hx    ??? Osteoporosis Neg Hx    ??? Rheumatologic disease Neg Hx    ??? Scoliosis Neg Hx    ??? Severe sprains Neg Hx    ??? Sickle cell anemia Neg Hx    ??? Spinal Compression Fracture Neg Hx    ??? Basal cell carcinoma Neg Hx    ??? Squamous cell carcinoma Neg Hx         Allergies   Hydroxychloroquine, Ondansetron hcl, Latex, and Adhesive tape-silicones       Review of Systems  .   Marland Kitchen  No chest pain, dyspnea, nausea, fevers, chills         DETAILED PHYSICAL EXAM  General Appearance ?? well-nourished and no acute distress   Mood and Affect ?? alert, cooperative and pleasant   Gait and Station ?? neutral standing alignment   Cardiovascular ?? well-perfused distally and no swelling   Sensation ?? Sensation intact to light touch distally      LEFT RIGHT   Knee   ??  ROM 0-135  ?? 3-4 quadrants patellar translation without apprehension     ?? No effusion  ?? ROM 0-135, no pain  ?? TTP at the medial joint line  ?? Mild pain medially with flexion pinch  ?? Ligamentous exam stable  ?? 3-4 quadrants patellar translation without apprehension         Imaging Studies  X-ray of the right knee was reviewed by myself and Dr. Olga Millers, per our interpretation there are no significant abnormalities.

## 2021-03-27 NOTE — Unmapped (Signed)
MRI Scheduling and follow up instructions:    Your [facility] Orth[facility]ed[facility] Provider has ordered an MRI test for you. The Radiology Department should call you directly in 2-3 business days to get this test scheduled. If you do not hear from the Radiology Department by this time, please feel free to call them , by calling: [phone], option 1. Once you have the MRI scheduled, please call the [facility] Call center to make a follow up telephone appointment with your provider to get the results of the test. The call center number is: [phone].       PLEASE NOTE:    For MRI to be done outside of :  Please call our financial counselor to obtain insurance authorization at the facility where the MRI will be performed.  Phone number:  [phone]    Thank you ,    [facility]

## 2021-03-28 ENCOUNTER — Encounter: Admit: 2021-03-28 | Discharge: 2021-03-29 | Payer: PRIVATE HEALTH INSURANCE

## 2021-03-28 NOTE — Unmapped (Signed)
Referring provider: Judithann Graves, MD  [address]  [place],  [place] [address]    Reason for clinic visit - Evaluation and surgical treatment of left neck pain    History of present illness    Allison Davenport, a 35 y.o. female,  lives in [place] presented unaccompanied. she was referred for evaluation and treatment of refractory disabling pain with Peripheral/Cranial Nerve Stimulation (PNS) trial.      Current pain symptoms -     Site Distribution Duration Character Intensity Course Evolution Aggravating factors Relieving factors   left neck Occiput, temporal, upper cervical upto the shoulder 8 years sharp and burning moderate to severe more in the evening worse bending, twisting, lifting weights, prolonged sitting, prolonged standing, walking, rainy weather and warm weather Occipital nerve block   Back and leg on the left Sacral and left S1 7 years Achy, vibration Mild to moderate More at night and early morning  worse Laying down none     Neck Pain contributes 90% to the overall pain related disability. Wakes up at night due to pain.    Pain is accompanied with memory problems, cognitive decline, anxiety, numbness and tingling    Pain procedure history   Procedure Year Center Outcome Complications   Occipital nerve blocks Since 2015 outside relief none   Trigger point injections 2016 outside relief none     Main disability associated with pain include inability to perform day-today activities, household work, professional work, take part in personal hobbies and attend family/social activities.    The patient's expectations for surgery include improve activities of daily living, engage in social activities, reduce pain, improve quality of life and medication reduction.    Past Medical History:   Diagnosis Date   ??? Abnormal ECG     SVT Dysautonomia   ??? Anemia    ??? Arthritis     Psoriatic   ??? Asthma    ??? Brain concussion    ??? Chronic pain syndrome    ??? Disease of thyroid gland     Hashimoto???s   ??? Headache    ??? History of transfusion    ??? Peripheral neuropathy    ??? Pneumonia        Past Surgical History:   Procedure Laterality Date   ??? HYSTERECTOMY         Current Outpatient Medications   Medication Sig Dispense Refill   ??? acetaminophen (TYLENOL) 500 MG tablet Take 1,000 mg by mouth daily as needed.     ??? albuterol HFA 90 mcg/actuation inhaler Inhale 1 puff daily as needed.     ??? apremilast (OTEZLA STARTER) 10 mg (4)-20 mg (4)-30 mg (47) DsPk Take as directed on package. 55 tablet 0   ??? apremilast 30 mg Tab Take 1 tablet (30 mg total) by mouth Two (2) times a day (Maintenance Dose) 60 tablet 11   ??? buPROPion (WELLBUTRIN XL) 150 MG 24 hr tablet Take 150 mg by mouth daily.     ??? calcipotriene (DOVONOX) 0.005 % ointment Apply topically Two (2) times a day. To psoriasis. Mix with Clobetasol ointment. 60 g 3   ??? calcium-vitamin D3-vitamin K 500-100-40 mg-unit-mcg Chew Chew daily.      ??? cholecalciferol, vitamin D3-50 mcg, 2,000 unit,, 50 mcg (2,000 unit) cap 50 mcg daily.      ??? clobetasoL (TEMOVATE) 0.05 % ointment Apply twice a day to affected areas of body (red/raised/itchy) until clear/smooth. Avoid face and folds. 60 g 1   ??? desonide (  DESOWEN) 0.05 % ointment Take as directed.      ??? levothyroxine (SYNTHROID) 75 MCG tablet Take 1 tablet by mouth every morning 30 to 60 minutes before breakfast.     ??? metoclopramide (REGLAN) 5 MG tablet Take as directed.      ??? nitrofurantoin, macrocrystal-monohydrate, (MACROBID) 100 MG capsule Take 1 capsule (100 mg total) by mouth Two (2) times a day for 5 days. (Patient taking differently: Take 100 mg by mouth Take as directed. ) 10 capsule 0   ??? NURTEC ODT 75 mg TbDL 75 mg every other day.      ??? onabotulinumtoxinA (BOTOX) 200 unit SolR injection Inject 1 Dose as directed every 3 (three) months.     ??? pediatric multivitamin Chew tablet Chew 1 tablet daily.     ??? pregabalin (LYRICA) 50 MG capsule Take 25 mg by mouth Two (2) times a day.      ??? pregabalin (LYRICA) 50 MG capsule 50 mg Two (2) times a day.        No current facility-administered medications for this visit.        Allergies:   Allergies   Allergen Reactions   ??? Hydroxychloroquine Other (See Comments)     Prolonged QT  Other reaction(s): Unknown   ??? Ondansetron Hcl Other (See Comments)     Prolonged QT  Prolonged QT   ??? Latex Dermatitis and Other (See Comments)   ??? Adhesive Tape-Silicones      Use paper tape       Family History   Problem Relation Age of Onset   ??? Multiple sclerosis Mother    ??? Melanoma Mother    ??? Cancer Mother         Melanoma   ??? COPD Mother    ??? Hyperlipidemia Mother    ??? Neuropathy Mother    ??? Osteoporosis Mother    ??? ALS Other    ??? Migraines Father    ??? No Known Problems Sister    ??? No Known Problems Brother    ??? No Known Problems Maternal Aunt    ??? No Known Problems Maternal Uncle    ??? No Known Problems Paternal Aunt    ??? No Known Problems Paternal Uncle    ??? Diabetes Maternal Grandmother    ??? Heart attack Maternal Grandfather    ??? Cancer Paternal Grandmother         Pancreatic   ??? Heart attack Paternal Grandmother    ??? No Known Problems Paternal Grandfather    ??? Anesthesia problems Neg Hx    ??? Broken bones Neg Hx    ??? Cancer Neg Hx    ??? Clotting disorder Neg Hx    ??? Collagen disease Neg Hx    ??? Diabetes Neg Hx    ??? Dislocations Neg Hx    ??? Fibromyalgia Neg Hx    ??? Gout Neg Hx    ??? Hemophilia Neg Hx    ??? Osteoporosis Neg Hx    ??? Rheumatologic disease Neg Hx    ??? Scoliosis Neg Hx    ??? Severe sprains Neg Hx    ??? Sickle cell anemia Neg Hx    ??? Spinal Compression Fracture Neg Hx    ??? Basal cell carcinoma Neg Hx    ??? Squamous cell carcinoma Neg Hx        Social History     Socioeconomic History   ??? Marital status: Married     Spouse name:  Not on file   ??? Number of children: Not on file   ??? Years of education: Not on file   ??? Highest education level: Not on file   Occupational History   ??? Not on file   Tobacco Use   ??? Smoking status: Never Smoker   ??? Smokeless tobacco: Never Used   Substance and Sexual Activity   ??? Alcohol use: Never   ??? Drug use: Never   ??? Sexual activity: Yes     Partners: Male     Birth control/protection: Surgical     Comment: Monogamous/Spouse   Other Topics Concern   ??? Do you use sunscreen? Yes   ??? Tanning bed use? No   ??? Are you easily burned? Yes   ??? Excessive sun exposure? Yes   ??? Blistering sunburns? Yes   ??? Exercise Yes   ??? Living Situation Yes     Comment: With husband and our children   Social History Narrative   ??? Not on file     Social Determinants of Health     Financial Resource Strain: Not on file   Food Insecurity: Not on file   Transportation Needs: Not on file   Physical Activity: Not on file   Stress: Not on file   Social Connections: Not on file       Review of Systems     Review of Systems   Constitutional: Positive for fever.   Eyes: Positive for blurred vision.   Respiratory: Positive for shortness of breath.    Cardiovascular: Positive for chest pain.   Gastrointestinal: Positive for heartburn.   Genitourinary: Positive for frequency.   Musculoskeletal: Positive for back pain, joint pain, myalgias and neck pain.   Skin: Positive for rash.   Psychiatric/Behavioral: Positive for depression. The patient is nervous/anxious.       Musculoskeletal: Ambulates with No assisted device    Physical Exam     Vitals: BP 116/74 (BP Site: L Arm, BP Position: Sitting, BP Cuff Size: Large)  - Pulse 91  - Wt 76.5 kg (168 lb 11.2 oz)  - SpO2 96%  - Breastfeeding No  - BMI 29.88 kg/m??    General/Constitutional: Well developed, well nourished female, who looks her stated age of 35 y.o..  No acute distress.  HEENT: Neck: full range of motion. PERL, EOMI  Cardiac: Regular rate and rhythm.  Abdominal:  Soft, non-tender, non-distended.    Extremities:  Normal range of motion in all four extremities, with normal strength equally and symmetrically. Neurological:  Conscious, alert and oriented.  No focal neurologic deficit. Skin: Skin is warm and dry. Psychiatric:  Appropriate mood and affect for her clinical situation.   Focused Neurologic Exam  Oriented to person, place, and time, speech clear and fluent.       R  L    Biceps   5  5     Triceps  5  5    HI   5  5    Quads   5  5    Hamstrings  5  5    DF   5  5    PF   5  5    Sensory deficits ??? abnormal in C6 and C7  On the left   Reflexes - normal quadriceps    Gait ??? normal  No occipital tenderness.     Imaging: MRI of the cervical spine reviewed. 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 The success of occipital nerve stimulation is variable. Cervical spinal cord stimulation can be tried to cover C2 below, however, again the success is limited. she failed adequate medication trial, physical therapy and nerve block. She is being evaluated for breast reduction. I think it can help with spinal alignment and may improve her neck pain. I explained to the patient the pathophysiology of neuropathic pain and the role of surgery as an adjunct for symptom control and quality of life improvement. Will hold off on any neuromodulation intervention. The surgery was discussed in detail including risks, benefits and alternatives. All patient's questions were answered to their satisfaction.

## 2021-03-31 NOTE — Unmapped (Signed)
I performed a history and physical examination of the patient and   discussed the patient's management with the Resident. I reviewed   the Resident's note and agree with the documented findings and plan   of care.

## 2021-04-01 NOTE — Unmapped (Signed)
Patient called on-call line due to concern about worsening depression and mood symptoms in the setting of Otezla. She reports that she has been on [place] for about 1 month. Last night, she had an angry outburst and felt like she wanted to hurt her husband, threw all his belongings out of the house. She is also on Wellbutrin, recently restarted, but this is not a new medication for her. She is concerned the [place] is making her depression worse and she wants an alternative agent for her psoriatic arthritis. No SI or HI currently.   I will forward this message to Dr. Marshall Cork so that they can discuss other options next week. She can stop [place] in the meantime. Encouraged patient to seek ER psych evaluation if symptoms worsen.    Tommy Rainwater, MD, PGY-5  [facility]  [phone]

## 2021-04-02 NOTE — Unmapped (Signed)
I called the patient and left a message she can call back or respond via MyChart regarding her concern about worsening mood symptoms on Otezla.    Janae Bridgeman, MD  [facility]   Pager [phone]  04/02/2021

## 2021-04-03 DIAGNOSIS — L409 Psoriasis, unspecified: Principal | ICD-10-CM

## 2021-04-03 DIAGNOSIS — L4050 Arthropathic psoriasis, unspecified: Principal | ICD-10-CM

## 2021-04-03 DIAGNOSIS — Z79899 Other long term (current) drug therapy: Principal | ICD-10-CM

## 2021-04-03 MED ORDER — COSENTYX PEN 300 MG/2 PENS (150 MG/ML) SUBCUTANEOUS
SUBCUTANEOUS | 11 refills | 0.00000 days | Status: CP
Start: 2021-04-03 — End: ?
  Filled 2021-04-16: qty 8, 28d supply, fill #0

## 2021-04-03 NOTE — Unmapped (Signed)
Did not tolerate Otezla due to side effects (depression, mood instability, GI). Conferred with Dr. Marshall Cork (Rheumatology) and agreed on next step with IL-17 inhibitors. Per patient, her insurance will cover Cosentyx. Reviewed immunosuppression. Negative personal or family history of IBD. Positive family history of MS steers [place] away from TNF alpha inhibitors. Will order Quant gold and ask patient to obtain at lab.      Allison Gianotti, MD  PGY-4 Resident Physician  [facility] Department of Dermatology

## 2021-04-03 NOTE — Unmapped (Signed)
Ordered Quant gold to [facility].

## 2021-04-04 DIAGNOSIS — L409 Psoriasis, unspecified: Principal | ICD-10-CM

## 2021-04-04 DIAGNOSIS — L4050 Arthropathic psoriasis, unspecified: Principal | ICD-10-CM

## 2021-04-05 ENCOUNTER — Encounter: Admit: 2021-04-05 | Discharge: 2021-04-06 | Payer: PRIVATE HEALTH INSURANCE

## 2021-04-07 LAB — QUANTIFERON TB GOLD PLUS
QUANTIFERON MITOGEN VALUE: >10 [IU]/mL
QUANTIFERON NIL VALUE: 0.01 [IU]/mL
QUANTIFERON TB GOLD: NEGATIVE
QUANTIFERON TB1 AG VALUE: 0.04 [IU]/mL
QUANTIFERON TB2 AG VALUE: 0.02 [IU]/mL

## 2021-04-07 NOTE — Unmapped (Signed)
Reviewed results of negative Quant gold. Patient notified through MyChart.    Theora Gianotti, MD  PGY-4 Resident Physician  [facility] Department of Dermatology

## 2021-04-09 NOTE — Unmapped (Unsigned)
ASSESSMENT / PLAN  Allison Davenport is a 35 y.o. 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    There are no diagnoses linked to this encounter.    No follow-ups on file.    No orders of the defined types were placed in this encounter.       Rudean Haskell, MD  ______________________________________________________________________    PCP: Lorenso Quarry, NP  [name]  DOB: 27-Nov-1986    SUBJECTIVE  Reason for visit:  Establish cardivascular care   HPI: Allison Davenport is a 35 y.o. 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          ______________________________________________________________________  Cardiovascular History & Procedures:    Cath / PCI:  ?? None     CV Surgery:  ??  None     EP Procedures and Devices:  ?? None     Non-Invasive Evaluation(s):  ?? None   ______________________________________________________________________  OTHER PAST MEDICAL HISTORY:  ?? Psoriatic arthritis  ?? Brain concussion   ?? Chronic pain syndrome   ?? Headache   ?? History of transfusion  ?? Peripheral neuropathy   ?? Pneumonia     ______________________________________________________________________   MEDICATIONS:  Reviewed in Epic:  Current Outpatient Medications   Medication Sig Dispense Refill   ??? acetaminophen (TYLENOL) 500 MG tablet Take 1,000 mg by mouth daily as needed.     ??? albuterol HFA 90 mcg/actuation inhaler Inhale 1 puff daily as needed.     ??? buPROPion (WELLBUTRIN XL) 150 MG 24 hr tablet Take 150 mg by mouth daily.     ??? calcipotriene (DOVONOX) 0.005 % ointment Apply topically Two (2) times a day. To psoriasis. Mix with Clobetasol ointment. 60 g 3   ??? calcium-vitamin D3-vitamin K 500-100-40 mg-unit-mcg Chew Chew daily.      ??? cholecalciferol, vitamin D3-50 mcg, 2,000 unit,, 50 mcg (2,000 unit) cap 50 mcg daily.      ??? clobetasoL (TEMOVATE) 0.05 % ointment Apply twice a day to affected areas of body (red/raised/itchy) until clear/smooth. Avoid face and folds. 60 g 1   ??? desonide (DESOWEN) 0.05 % ointment Take as directed.      ??? levothyroxine (SYNTHROID) 75 MCG tablet Take 1 tablet by mouth every morning 30 to 60 minutes before breakfast.     ??? metoclopramide (REGLAN) 5 MG tablet Take as directed.      ??? NURTEC ODT 75 mg TbDL 75 mg every other day.      ??? onabotulinumtoxinA (BOTOX) 200 unit SolR injection Inject 1 Dose as directed every 3 (three) months.     ??? pediatric multivitamin Chew tablet Chew 1 tablet daily.     ??? pregabalin (LYRICA) 50 MG capsule Take 25 mg by mouth Two (2) times a day.      ??? pregabalin (LYRICA) 50 MG capsule 50 mg Two (2) times a day.      ??? secukinumab (COSENTYX PEN, 2 PENS,) 150 mg/mL PnIj injection Inject the contents of 2 pens (300mg ) under the skin weekly for the first 5 doses. 10 mL 0   ??? secukinumab (COSENTYX PEN, 2 PENS,)  150 mg/mL PnIj injection Inject the contents of 2 pens (300mg ) under the skin every 4 weeks. 2 mL 11     No current facility-administered medications for this visit.       ______________________________________________________________________   ALLERGIES:  Reviewed in Epic:  Allergies   Allergen Reactions   ??? Hydroxychloroquine Other (See Comments)     Prolonged QT  Other reaction(s): Unknown   ??? Ondansetron Hcl Other (See Comments)     Prolonged QT  Prolonged QT   ??? Latex Dermatitis and Other (See Comments)   ??? Adhesive Tape-Silicones      Use paper tape ______________________________________________________________________  SOCIAL HISTORY:  She  reports that she has never smoked. She has never used smokeless tobacco. She reports that she does not drink alcohol and does not use drugs.   ______________________________________________________________________  FAMILY HISTORY:  Shefamily history includes ALS in an other family member; COPD in her mother; Cancer in her mother and paternal grandmother; Diabetes in her maternal grandmother; Heart attack in her maternal grandfather and paternal grandmother; Hyperlipidemia in her mother; Melanoma in her mother; Migraines in her father; Multiple sclerosis in her mother; Neuropathy in her mother; No Known Problems in her brother, maternal aunt, maternal uncle, paternal aunt, paternal grandfather, paternal uncle, and sister; Osteoporosis in her mother.   Family History   Problem Relation Age of Onset   ??? Multiple sclerosis Mother    ??? Melanoma Mother    ??? Cancer Mother         Melanoma   ??? COPD Mother    ??? Hyperlipidemia Mother    ??? Neuropathy Mother    ??? Osteoporosis Mother    ??? ALS Other    ??? Migraines Father    ??? No Known Problems Sister    ??? No Known Problems Brother    ??? No Known Problems Maternal Aunt    ??? No Known Problems Maternal Uncle    ??? No Known Problems Paternal Aunt    ??? No Known Problems Paternal Uncle    ??? Diabetes Maternal Grandmother    ??? Heart attack Maternal Grandfather    ??? Cancer Paternal Grandmother         Pancreatic   ??? Heart attack Paternal Grandmother    ??? No Known Problems Paternal Grandfather    ??? Anesthesia problems Neg Hx    ??? Broken bones Neg Hx    ??? Cancer Neg Hx    ??? Clotting disorder Neg Hx    ??? Collagen disease Neg Hx    ??? Diabetes Neg Hx    ??? Dislocations Neg Hx    ??? Fibromyalgia Neg Hx    ??? Gout Neg Hx    ??? Hemophilia Neg Hx    ??? Osteoporosis Neg Hx    ??? Rheumatologic disease Neg Hx    ??? Scoliosis Neg Hx    ??? Severe sprains Neg Hx    ??? Sickle cell anemia Neg Hx    ??? Spinal Compression Fracture Neg Hx    ??? Basal cell carcinoma Neg Hx    ??? Squamous cell carcinoma Neg Hx      ______________________________________________________________________  ROS:  All other systems reviewed and negative except for what is listed in the HPI  ______________________________________________________________________  OBJECTIVE  There were no vitals taken for this visit.    PHYSICAL EXAMINATION:   GENERAL:  Alert, NAD  EYES: Sclerae clear, EOMI b/l  ENT:  OP clear w/o exudate  NECK: Supple  CARDIOVASCULAR:  Regular rate and rhythm, normal S1/S2, no murmurs, rubs, or gallops. There is no JVD when the patient is sitting upright. No edema.  RESPIRATORY:  Clear to auscultation bilaterally.  No wheezes, crackles, or rhonchi. Normal work of breathing.  ABDOMEN/GI:  Soft, non-tender, non-distended with normoactive bowel sounds.  MSK: No effusions  NEUROLOGIC:  CN III-XII in tact, motor exam grossly non-focal.  SKIN: No rashes  PSYCH:  Normal mental status, mood, and affect.      ______________________________________________________________________  EKG  ***    OTHER PERTINENT LABS/STUDIES:  Lab Results   Component Value Date    Sodium 139 03/19/2021    Potassium 3.3 (L) 03/19/2021    Chloride 105 03/19/2021    CO2 25.7 03/19/2021    BUN <5 (L) 03/19/2021    Creatinine 0.70 03/19/2021     Lab Results   Component Value Date    HGB 13.6 03/19/2021    MCV 81.8 03/19/2021    Platelet 268 03/19/2021     Lab Results   Component Value Date    INR 1.12 03/19/2021         Portions of this record have been created using [employer]. Dictation errors have been sought, but may not have been identified and corrected.    I attest that I, Queen Blossom, personally documented this note while acting as scribe for Rudean Haskell, MD.    Joanne Gavel [place], Scribe.    The documentation recorded by the scribe accurately reflects the service I personally performed and the decisions made by me.    Rudean Haskell, MD.     *** make me Thereasa Parkin

## 2021-04-13 MED ORDER — EMPTY CONTAINER
2 refills | 0 days
Start: 2021-04-13 — End: ?

## 2021-04-13 NOTE — Unmapped (Signed)
[facility]   Patient Onboarding/Medication Counseling    [name] is a 35 y.o. female with psoriasis and psoriatic arthritis who I am counseling today on initiation of therapy.  I am speaking to the patient.    Was a Nurse, learning disability used for this call? No    Verified patient's date of birth / HIPAA.    Specialty medication(s) to be sent: Inflammatory Disorders: Cosentyx      Non-specialty medications/supplies to be sent: sharps kit      Medications not needed at this time: na         Cosentyx (secukinumab)    Medication & Administration     Dosage: Plaque psoriasis: Inject 300mg  under the skin at weeks 0, 1, 2, 3, and 4 followed by 300mg  every 4 weeks      Lab tests required prior to treatment initiation:  ??? Tuberculosis: Tuberculosis screening resulted in a non-reactive Quantiferon TB Gold assay.      Administration:     Prefilled Sensoready?? auto-injector pen  1. Gather all supplies needed for injection on a clean, flat working surface: medication pen removed from packaging, alcohol swab, sharps container, etc.  2. Look at the medication label ??? look for correct medication, correct dose, and check the expiration date  3. Look at the medication ??? the liquid visible in the window on the side of the pen device should appear clear and colorless to slightly yellow  4. Lay the auto-injector pen on a flat surface and allow it to warm up to room temperature for at least 15-30 minutes  5. Select injection site ??? you can use the front of your thigh or your belly (but not the area 2 inches around your belly button); if someone else is giving you the injection you can also use your upper arm in the skin covering your triceps muscle  6. Prepare injection site ??? wash your hands and clean the skin at the injection site with an alcohol swab and let it air dry, do not touch the injection site again before the injection  7. Twist off the purple safety cap in the direction of the arrow, do not remove until immediately prior to injection and do not touch the yellow needle cover  8. Put the white needle cover against your skin at the injection site at a 90 degree angle, hold the pen such that you can see the clear medication window  9. Press down and hold the pen firmly against your skin, there will be a click when the injection starts  10. Continue to hold the pen firmly against your skin for about 10-15 seconds ??? the window will start to turn solid green  11. There will be a second click sound when the injection is almost complete, verify the window is solid green to indicate the injection is complete and then pull the pen away from your skin  12. Dispose of the used auto-injector pen immediately in your sharps disposal container the needle will be covered automatically  13. If you see any blood at the injection site, press a cotton ball or gauze on the site and maintain pressure until the bleeding stops, do not rub the injection site      Adherence/Missed dose instructions:  If your injection is given more than 4 days after your scheduled injection date ??? consult your pharmacist for additional instructions on how to adjust your dosing schedule.        Goals of Therapy  Plaque Psoriasis  ??? Minimize areas of skin involvement (% BSA)  ??? Avoidance of long term glucocorticoid use  ??? Maintenance of effective psychosocial functioning       Side Effects & Monitoring Parameters     ??? Injection site reaction (redness, irritation, inflammation localized to the site of administration)  ??? Signs of a common cold ??? minor sore throat, runny or stuffy nose, etc.  ??? Diarrhea    The following side effects should be reported to the provider:  ??? Signs of a hypersensitivity reaction ??? rash; hives; itching; red, swollen, blistered, or peeling skin; wheezing; tightness in the chest or throat; difficulty breathing, swallowing, or talking; swelling of the mouth, face, lips, tongue, or throat; etc.  ??? Reduced immune function ??? report signs of infection such as fever; chills; body aches; very bad sore throat; ear or sinus pain; cough; more sputum or change in color of sputum; pain with passing urine; wound that will not heal, etc.  Also at a slightly higher risk of some malignancies (mainly skin and blood cancers) due to this reduced immune function.  o In the case of signs of infection ??? the patient should hold the next dose of Cosentyx?? and call your primary care provider to ensure adequate medical care.  Treatment may be resumed when infection is treated and patient is asymptomatic.  ??? Muscle pain or weakness  ??? Shortness of breath      Warnings, Precautions, & Contraindications     ??? Have your bloodwork checked as you have been told by your prescriber  ??? Talk with your doctor if you are pregnant, planning to become pregnant, or breastfeeding  ??? Discuss the possible need for holding your dose(s) of Cosentyx?? when a planned procedure is scheduled with the prescriber as it may delay healing/recovery timeline       Drug/Food Interactions     ??? Medication list reviewed in Epic. The patient was instructed to inform the care team before taking any new medications or supplements. No drug interactions identified.   ??? If you have a latex allergy use caution when handling, the needle cap of the Cosentyx?? prefilled syringe and the safety cap for the Cosentyx Sensoready?? pen contains a derivative of natural rubber latex  ??? Talk with you prescriber or pharmacist before receiving any live vaccinations while taking this medication and after you stop taking it      Storage, Handling Precautions, & Disposal     ??? Store this medication in the refrigerator.  Do not freeze  ??? If needed, you may store at room temperature for up to 1 hour  ??? Store in original packaging, protected from light  ??? Do not shake  ??? Dispose of used syringes/pens in a sharps disposal container           Current Medications (including OTC/herbals), Comorbidities and Allergies     Current Outpatient Medications   Medication Sig Dispense Refill   ??? acetaminophen (TYLENOL) 500 MG tablet Take 1,000 mg by mouth daily as needed.     ??? albuterol HFA 90 mcg/actuation inhaler Inhale 1 puff daily as needed.     ??? buPROPion (WELLBUTRIN XL) 150 MG 24 hr tablet Take 150 mg by mouth daily.     ??? calcipotriene (DOVONOX) 0.005 % ointment Apply topically Two (2) times a day. To psoriasis. Mix with Clobetasol ointment. 60 g 3   ??? calcium-vitamin D3-vitamin K 500-100-40 mg-unit-mcg Chew Chew daily.      ??? cholecalciferol,  vitamin D3-50 mcg, 2,000 unit,, 50 mcg (2,000 unit) cap 50 mcg daily.      ??? clobetasoL (TEMOVATE) 0.05 % ointment Apply twice a day to affected areas of body (red/raised/itchy) until clear/smooth. Avoid face and folds. 60 g 1   ??? desonide (DESOWEN) 0.05 % ointment Take as directed.      ??? levothyroxine (SYNTHROID) 75 MCG tablet Take 1 tablet by mouth every morning 30 to 60 minutes before breakfast.     ??? metoclopramide (REGLAN) 5 MG tablet Take as directed.      ??? NURTEC ODT 75 mg TbDL 75 mg every other day.      ??? onabotulinumtoxinA (BOTOX) 200 unit SolR injection Inject 1 Dose as directed every 3 (three) months.     ??? pediatric multivitamin Chew tablet Chew 1 tablet daily.     ??? pregabalin (LYRICA) 50 MG capsule Take 25 mg by mouth Two (2) times a day.      ??? pregabalin (LYRICA) 50 MG capsule 50 mg Two (2) times a day.      ??? secukinumab (COSENTYX PEN, 2 PENS,) 150 mg/mL PnIj injection Inject the contents of 2 pens (300mg ) under the skin weekly for the first 5 doses. 10 mL 0   ??? secukinumab (COSENTYX PEN, 2 PENS,) 150 mg/mL PnIj injection Inject the contents of 2 pens (300mg ) under the skin every 4 weeks. 2 mL 11     No current facility-administered medications for this visit.       Allergies   Allergen Reactions   ??? Hydroxychloroquine Other (See Comments)     Prolonged QT  Other reaction(s): Unknown   ??? Ondansetron Hcl Other (See Comments)     Prolonged QT  Prolonged QT   ??? Latex Dermatitis and Other (See Comments)   ??? Adhesive Tape-Silicones      Use paper tape       Patient Active Problem List   Diagnosis   ??? Large breasts   ??? Paroxysmal SVT (supraventricular tachycardia) (CMS-HCC)   ??? POTS (postural orthostatic tachycardia syndrome)   ??? Rh negative state in antepartum period   ??? Sjogren's syndrome (CMS-HCC)   ??? Undifferentiated connective tissue disease (CMS-HCC)   ??? Raynaud's disease without gangrene   ??? Psoriatic arthritis (CMS-HCC)       Reviewed and up to date in Epic.    Appropriateness of Therapy     Acute infections noted within Epic:  No active infections  Patient reported infection: None    Is medication and dose appropriate based on diagnosis and infection status? Yes    Prescription has been clinically reviewed: Yes      Baseline Quality of Life Assessment      How many days over the past month did your psoriasis  keep you from your normal activities? For example, brushing your teeth or getting up in the morning. 0    Financial Information     Medication Assistance provided: Prior Authorization    Anticipated copay of $0 reviewed with patient. Verified delivery address.    Delivery Information     Scheduled delivery date: Tues, April 26    Expected start date: Tues, 26    Medication will be delivered via UPS to the prescription address in [place].  This shipment will not require a signature.      Explained the services we provide at [facility] and that each month we would call to set up refills.  Stressed importance of returning  phone calls so that we could ensure they receive their medications in time each month.  Informed patient that we should be setting up refills 7-10 days prior to when they will run out of medication.  A pharmacist will reach out to perform a clinical assessment periodically.  Informed patient that a welcome packet, containing information about our pharmacy and other support services, a Notice of Privacy Practices, and a drug information handout will be sent.      Patient verbalized understanding of the above information as well as how to contact the pharmacy at [phone] option 4 with any questions/concerns.  The pharmacy is open Monday through Friday 8:30am-4:30pm.  A pharmacist is available 24/7 via pager to answer any clinical questions they may have.    Patient Specific Needs     - Does the patient have any physical, cognitive, or cultural barriers? No    - Patient prefers to have medications discussed with  Patient     - Is the patient or caregiver able to read and understand education materials at a high school level or above? Yes    - Patient's primary language is  English     - Is the patient high risk? No    - Does the patient require a Care Management Plan? No     - Does the patient require physician intervention or other additional services (i.e. nutrition, smoking cessation, social work)? No      Kaylob Wallen A Desiree Lucy [facility] Specialty Pharmacist

## 2021-04-13 NOTE — Unmapped (Signed)
[facility] Specialty Medication Onboarding    Specialty Medication: Cosentyx loading and Maintenance doses  Prior Authorization: Approved   Financial Assistance: No - copay  <$25  Final Copay/Day Supply: $0 / 28    Insurance Restrictions: Yes - max 1 month supply     Notes to Pharmacist: n/a    The triage team has completed the benefits investigation and has determined that the patient is able to fill this medication at [facility]. Please contact the patient to complete the onboarding or follow up with the prescribing physician as needed.

## 2021-04-16 MED FILL — EMPTY CONTAINER: 120 days supply | Qty: 1 | Fill #0

## 2021-04-17 ENCOUNTER — Encounter: Admit: 2021-04-17 | Discharge: 2021-04-18 | Payer: PRIVATE HEALTH INSURANCE

## 2021-04-17 DIAGNOSIS — M25569 Pain in unspecified knee: Principal | ICD-10-CM

## 2021-04-17 NOTE — Unmapped (Signed)
ORTHOPAEDIC CLINIC NOTE     ASSESSMENT:  Allison Davenport is a 35 y.o. female  with:  1. Right knee pain in the setting of Ehlers-Danlos Syndrome    PLAN:  We discussed the results of her MRI.  We discussed with her that the etiology of her knee pain is likely multifactorial in nature.  Given her ligamentous laxity and Ehlers-Danlos syndrome, we would not recommend any sort of patellar realignment at this time as she has only had a single dislocation and again her knee pain is likely multifactorial and does not seem to be focused on the patellofemoral joint.  At this time we would recommend continued anti-inflammatories as needed and continue to strengthen the muscles around the hip and knee.  Patient was agreement with this plan, questions were answered.  - Follow-up plan: PRN  - X-rays to be ordered next visit: None    SUBJECTIVE:  Chief Complaint:   Right knee pain    History of Present Illness:   35 y.o.  female with a past history of Ehlers-Danlos syndrome, who presents today for new patient evaluation of right knee pain.  The patient states she has had right knee pain since she was 35 years old due to her hypermobility.  Her knee pain has been off and on since then however but has been worse in the last couple months.  She states it is mostly on the medial side and is mostly painful when ambulating or resting for long periods of time.  She states it gives out frequently when ambulating.  She also describes an episode 4 weeks ago where she had her leg crossed and got locked in flexion, she required help by her husband to straighten her knee and felt a pop when it straightened.  She did physical therapy approximately 1 year ago and has been doing home exercises daily.  She wears a hinged knee brace at times which minimally helps.  She takes Tylenol and naproxen daily.  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  At our last visit, we ordered an MRI to rule out medial meniscus tear.  She is here today for review of the MRI.  On current evaluation she states that her symptoms are unchanged.  She clarified with her husband that during the dislocation event he did believe that the patella was dislocated and popped back into place.  She continues to endorse feelings of patellar instability in the knee and medial sided knee pain.      Medical History  Past Medical History:   Diagnosis Date   ??? Abnormal ECG     SVT Dysautonomia   ??? Anemia    ??? Arthritis     Psoriatic   ??? Asthma    ??? Brain concussion    ??? Chronic pain syndrome    ??? Disease of thyroid gland     Hashimoto???s   ??? Headache    ??? History of transfusion    ??? Peripheral neuropathy    ??? Pneumonia     Surgical History  Past Surgical History:   Procedure Laterality Date   ??? HYSTERECTOMY      Medications  has a current medication list which includes the following prescription(s): pregabalin, acetaminophen, albuterol, bupropion, calcipotriene, calcium-vitamin d3-vitamin k, cholecalciferol (vitamin d3-50 mcg (2,000 unit)), clobetasol, desonide, empty container, levothyroxine, metoclopramide, nurtec odt, onabotulinumtoxina, pediatric multivitamin, pregabalin, cosentyx pen (2 pens), and cosentyx  pen (2 pens).   Tobacco/Alcohol History  Social History     Tobacco Use   Smoking Status Never Smoker   Smokeless Tobacco Never Used     Social History     Substance and Sexual Activity   Alcohol Use Never    Social History        Occupational History   ??? Not on file       Home Address:  [address]  [place] [address] Family History  Family History   Problem Relation Age of Onset   ??? Multiple sclerosis Mother    ??? Melanoma Mother    ??? Cancer Mother         Melanoma   ??? COPD Mother    ??? Hyperlipidemia Mother    ??? Neuropathy Mother    ??? Osteoporosis Mother    ??? ALS Other    ??? Migraines Father    ??? No Known Problems Sister    ??? No Known Problems Brother    ??? No Known Problems Maternal Aunt    ??? No Known Problems Maternal Uncle    ??? No Known Problems Paternal Aunt    ??? No Known Problems Paternal Uncle    ??? Diabetes Maternal Grandmother    ??? Heart attack Maternal Grandfather    ??? Cancer Paternal Grandmother         Pancreatic   ??? Heart attack Paternal Grandmother    ??? No Known Problems Paternal Grandfather    ??? Anesthesia problems Neg Hx    ??? Broken bones Neg Hx    ??? Cancer Neg Hx    ??? Clotting disorder Neg Hx    ??? Collagen disease Neg Hx    ??? Diabetes Neg Hx    ??? Dislocations Neg Hx    ??? Fibromyalgia Neg Hx    ??? Gout Neg Hx    ??? Hemophilia Neg Hx    ??? Osteoporosis Neg Hx    ??? Rheumatologic disease Neg Hx    ??? Scoliosis Neg Hx    ??? Severe sprains Neg Hx    ??? Sickle cell anemia Neg Hx    ??? Spinal Compression Fracture Neg Hx    ??? Basal cell carcinoma Neg Hx    ??? Squamous cell carcinoma Neg Hx         Allergies   Hydroxychloroquine, Ondansetron hcl, Latex, and Adhesive tape-silicones       Review of Systems  .   Marland Kitchen  No chest pain, dyspnea, nausea, fevers, chills         DETAILED PHYSICAL EXAM  General Appearance ?? well-nourished and no acute distress   Mood and Affect ?? alert, cooperative and pleasant   Gait and Station ?? neutral standing alignment   Cardiovascular ?? well-perfused distally and no swelling   Sensation ?? Sensation intact to light touch distally      LEFT RIGHT   Knee   ??  ROM 0-135  ?? 3-4 quadrants patellar translation without apprehension     ?? No effusion  ?? ROM 0-135, no pain  ?? TTP at the medial joint line  ?? Mild pain medially with flexion pinch  ?? Ligamentous exam stable  ?? 3-4 quadrants patellar translation without apprehension         Imaging Studies  MRI of the right knee was reviewed by myself and Dr. Olga Millers, per our interpretation there is mild chondrosis on the lateral femoral condyle, potentially from patellar dislocation,  otherwise no other significant abnormalities appreciated.

## 2021-04-17 NOTE — Unmapped (Signed)
ASSESSMENT / PLAN  Allison Davenport is a 35 y.o. female with a history of connective tissue disease, hashimoto's thyroiditis, Sjogren's syndrome, Ehlers-Danlos syndrome, systemic lupus erythematosus, iatrogenic long QT interval (while taking plaquenil and Zofran), hypercholesteremia, and reported paroxysmal SVT, status post unsuccessful catheter ablation in 2015, which was only able to induce sinus tachycardia, who is seen in consultation at the request of Allison Davenport for evaluation of POTS.    POTS (postural orthostatic tachycardia syndrome)  Likely associated with hypermobile Ehlers-Danlos syndrome and autoimmune disease.  She has failed Florinef, beta-blocker, calcium channel blocker, and midodrine in the past, but reported good symptom improvment after IV fluid resuscitation.  However, that requires frequent hospital emergency room visits.  I discussed with the patient that home IV sets is not a viable option, given potential risks of infection with indwelling catheter.  - Oral fluid intake was encouraged to a target of 3 L daily and a daily salt intake of 8 to 12 g of sodium chloride. Available sources of sodium include table salt, sports tablets, sports beverages, oral rehydration salts, and some soups.   -She is encouraged to continue exercise at home with a stationary bike.  -She will continue to wear compressive garments.  -We discussed a trial of treatment with ivabradine twice a day.  Patient decided to put it off for now.    Return in about 6 months (around 10/18/2021).    Orders Placed This Encounter   Procedures   ??? ECG 12 Lead     Standing Status:   Future     Number of Occurrences:   1     Standing Expiration Date:   04/18/2022        Allison Haskell, MD  ______________________________________________________________________    PCP: Allison Quarry, NP  [name]  DOB: January 20, 1986    SUBJECTIVE  Reason for visit:  Establish cardivascular care   HPI: Allison Davenport is a 35 y.o. 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    Allison Davenport is a pleasant 35 years old young female with history of multiple connective tissue and autoimmune diseases as listed above.  She has been experiencing orthostatic intolerance with excessive tachycardia without hypotension after changing positions in the last few years and was diagnosed with postural orthostatic tachycardia syndrome.  She described that symptoms of tachycardia are brought on by standing relief by sitting down.  She states that her heart rate can go all the way up to 200 bpm, with which she reported lightheadedness and palpitations.  She reported frequent flareups that required hospital visits, where she reported improvement after receiving IV fluid bolus.  She was previously followed by Dr. Lupita Davenport and later by Dr. Darrold Davenport at [facility].  Patient reported that he was compliant on instructions and went through all treatments, but with unsatisfied results.  She was previously on Florinef with overall clinical improvement, however she cannot tolerate it due to side effects of headache.  She also tried a beta-blocker and calcium channel blocker, but reported bradycardia and lack control of symptoms.  She was not able to tolerate midodrine as well in the past.  She wears compressive garments, and drink plenty of fluids on a daily basis. She tries to stay active by riding a stationary bike for total of 2 hours over the course of  the day.  She was very frustrated with the fact that she has frequent flareups of those symptoms that required hospital emergency room visit.  She develops nausea and vomiting when she had an acute flare which prevents her from keeping fluids down orally.  However she cannot take Zofran or Phenergan due to she has prolonged QT of 600 ms.  She was a paramedics, and she is inquiring if she is able to obtain home IV sets to start IV fluid treatment herself at home.  She otherwise doing well, denied exertional chest pain, shortness of breath, leg swelling, presyncope or syncope.    2D echocardiogram on 11/26/2019 revealed normal left ventricular function with LVEF greater than 55% with trivial valvular insufficiencies. The patient is scheduled to undergo abdominoplasty and urology has recommended abdominal binding after the procedure. 14-day Holter monitor was performed which revealed predominant sinus rhythm with mean heart rate of 81 bpm. Sinus bradycardia was observed with minimal heart rate 40 bpm. Sinus tachycardia was observed with a maximum heart rate of 179 bpm. Brief atrial runs were observed the longest lasting 4 beats. Rare premature ventricular contractions were present. There was no correlation with diary entries.  ______________________________________________________________________  Cardiovascular History & Procedures:    Cath / PCI:  ?? None     CV Surgery:  ??  None     EP Procedures and Devices:  ?? None     Non-Invasive Evaluation(s):  ?? None   ______________________________________________________________________  OTHER PAST MEDICAL HISTORY:  ?? Psoriatic arthritis  ?? Chronic pain syndrome  ?? SLE  ?? Ehlers-Danlos syndrome  ?? Hashimoto thyroiditis      ______________________________________________________________________   MEDICATIONS:  Reviewed in Epic:  Current Outpatient Medications   Medication Sig Dispense Refill   ??? acetaminophen (TYLENOL) 500 MG tablet Take 1,000 mg by mouth daily as needed.     ??? albuterol HFA 90 mcg/actuation inhaler Inhale 1 puff daily as needed.     ??? buPROPion (WELLBUTRIN XL) 150 MG 24 hr tablet Take 150 mg by mouth daily.     ??? calcium-vitamin D3-vitamin K 500-100-40 mg-unit-mcg [employer] daily.      ??? cholecalciferol, vitamin D3-50 mcg, 2,000 unit,, 50 mcg (2,000 unit) cap 50 mcg daily.      ??? clobetasoL (TEMOVATE) 0.05 % ointment Apply twice a day to affected areas of body (red/raised/itchy) until clear/smooth. Avoid face and folds. 60 g 1   ??? desonide (DESOWEN) 0.05 % ointment Take as directed.      ??? empty container Misc Use as directed to dispose of Cosentyx pens. 1 each 2   ??? levothyroxine (SYNTHROID) 75 MCG tablet Take 1 tablet by mouth every morning 30 to 60 minutes before breakfast.     ??? metoclopramide (REGLAN) 5 MG tablet Take 5 mg by mouth Take as directed.      ??? naproxen (NAPROSYN) 500 MG tablet Take 500 mg by mouth daily.     ??? NURTEC ODT 75 mg TbDL 75 mg every other day.      ??? omeprazole (PRILOSEC) 20 MG capsule Take 20 mg by mouth daily.     ??? onabotulinumtoxinA (BOTOX) 200 unit SolR injection Inject 1 Dose as directed every 3 (three) months.     ??? pediatric multivitamin Chew tablet Chew 1 tablet daily.     ??? pregabalin (LYRICA) 50 MG capsule Take 25 mg by mouth Two (2) times a day.      ??? pregabalin (LYRICA) 50 MG capsule 50 mg Two (  2) times a day.      ??? secukinumab (COSENTYX PEN, 2 PENS,) 150 mg/mL PnIj injection Inject the contents of 2 pens (300mg ) under the skin weekly for the first 5 doses. 10 mL 0   ??? secukinumab (COSENTYX PEN, 2 PENS,) 150 mg/mL PnIj injection Inject the contents of 2 pens (300mg ) under the skin every 4 weeks. 2 mL 11   ??? calcipotriene (DOVONOX) 0.005 % ointment Apply topically Two (2) times a day. To psoriasis. Mix with Clobetasol ointment. (Patient not taking: Reported on 04/18/2021) 60 g 3     No current facility-administered medications for this visit.       ______________________________________________________________________   ALLERGIES:  Reviewed in Epic:  Allergies   Allergen Reactions   ??? Hydroxychloroquine Other (See Comments)     Prolonged QT  Other reaction(s): Unknown   ??? Ondansetron Hcl Other (See Comments)     Prolonged QT  Prolonged QT   ??? Otezla [Apremilast] Hallucinations     Pt stated this med makes her depression worse , makes her angry.   ??? Latex Dermatitis and Other (See Comments)   ??? Adhesive Tape-Silicones      Use paper tape       ______________________________________________________________________  SOCIAL HISTORY:  She  reports that she has never smoked. She has never used smokeless tobacco. She reports that she does not drink alcohol and does not use drugs.   ______________________________________________________________________  FAMILY HISTORY:  Shefamily history includes ALS in an other family member; COPD in her mother; Cancer in her mother and paternal grandmother; Diabetes in her maternal grandmother; Heart attack in her maternal grandfather and paternal grandmother; Hyperlipidemia in her mother; Melanoma in her mother; Migraines in her father; Multiple sclerosis in her mother; Neuropathy in her mother; No Known Problems in her brother, maternal aunt, maternal uncle, paternal aunt, paternal grandfather, paternal uncle, and sister; Osteoporosis in her mother.   Family History   Problem Relation Age of Onset   ??? Multiple sclerosis Mother    ??? Melanoma Mother    ??? Cancer Mother         Melanoma   ??? COPD Mother    ??? Hyperlipidemia Mother    ??? Neuropathy Mother    ??? Osteoporosis Mother    ??? ALS Other    ??? Migraines Father    ??? No Known Problems Sister    ??? No Known Problems Brother    ??? No Known Problems Maternal Aunt    ??? No Known Problems Maternal Uncle    ??? No Known Problems Paternal Aunt    ??? No Known Problems Paternal Uncle    ??? Diabetes Maternal Grandmother    ??? Heart attack Maternal Grandfather    ??? Cancer Paternal Grandmother         Pancreatic   ??? Heart attack Paternal Grandmother    ??? No Known Problems Paternal Grandfather    ??? Anesthesia problems Neg Hx    ??? Broken bones Neg Hx    ??? Cancer Neg Hx    ??? Clotting disorder Neg Hx    ??? Collagen disease Neg Hx    ??? Diabetes Neg Hx    ??? Dislocations Neg Hx    ??? Fibromyalgia Neg Hx    ??? Gout Neg Hx    ??? Hemophilia Neg Hx    ??? Osteoporosis Neg Hx    ??? Rheumatologic disease Neg Hx    ??? Scoliosis Neg Hx    ??? Severe  sprains Neg Hx    ??? Sickle cell anemia Neg Hx    ??? Spinal Compression Fracture Neg Hx    ??? Basal cell carcinoma Neg Hx    ??? Squamous cell carcinoma Neg Hx      ______________________________________________________________________  ROS:  All other systems reviewed and negative except for what is listed in the HPI  ______________________________________________________________________  OBJECTIVE  BP 131/88 (BP Site: L Arm, BP Position: Standing, BP Cuff Size: Large)  - Pulse 121  - Temp 36.2 ??C (97.2 ??F) (Tympanic)  - Resp 18  - Ht 160 cm (5' 3)  - Wt 76.7 kg (169 lb)  - SpO2 98%  - BMI 29.94 kg/m??     PHYSICAL EXAMINATION:   GENERAL:  Alert, NAD  EYES: Sclerae clear, EOMI b/l  ENT:  OP clear w/o exudate  NECK: Supple  CARDIOVASCULAR:  Regular rate and rhythm, normal S1/S2, no murmurs, rubs, or gallops. There is no JVD when the patient is sitting upright. No edema.  RESPIRATORY:  Clear to auscultation bilaterally.  No wheezes, crackles, or rhonchi. Normal work of breathing.  ABDOMEN/GI:  Soft, non-tender, non-distended with normoactive bowel sounds.  MSK: No effusions  NEUROLOGIC:  CN III-XII in tact, motor exam grossly non-focal.  SKIN: No rashes  PSYCH:  Normal mental status, mood, and affect.      ______________________________________________________________________  EKG  04/18/2021: Normal sinus rhythm with sinus arrhythmia, heart rate of 63 bpm    OTHER PERTINENT LABS/STUDIES:  Lab Results   Component Value Date    Sodium 139 03/19/2021    Potassium 3.3 (L) 03/19/2021    Chloride 105 03/19/2021    CO2 25.7 03/19/2021    BUN <5 (L) 03/19/2021    Creatinine 0.70 03/19/2021     Lab Results   Component Value Date    HGB 13.6 03/19/2021    MCV 81.8 03/19/2021    Platelet 268 03/19/2021     Lab Results   Component Value Date    INR 1.12 03/19/2021         Portions of this record have been created using [employer]. Dictation errors have been sought, but may not have been identified and corrected.    Allison Haskell, MD.

## 2021-04-18 ENCOUNTER — Encounter: Admit: 2021-04-18 | Discharge: 2021-04-19 | Payer: PRIVATE HEALTH INSURANCE

## 2021-04-18 ENCOUNTER — Encounter
Admit: 2021-04-18 | Discharge: 2021-04-19 | Payer: PRIVATE HEALTH INSURANCE | Attending: Physician Assistant | Primary: Physician Assistant

## 2021-04-18 DIAGNOSIS — S60222A Contusion of left hand, initial encounter: Principal | ICD-10-CM

## 2021-04-18 DIAGNOSIS — S6992XA Unspecified injury of left wrist, hand and finger(s), initial encounter: Principal | ICD-10-CM

## 2021-04-18 DIAGNOSIS — R21 Rash and other nonspecific skin eruption: Principal | ICD-10-CM

## 2021-04-18 DIAGNOSIS — Z7689 Persons encountering health services in other specified circumstances: Principal | ICD-10-CM

## 2021-04-18 MED ORDER — CLOBETASOL 0.05 % TOPICAL OINTMENT
OPHTHALMIC | 1 refills | 0.00000 days | Status: CP
Start: 2021-04-18 — End: ?

## 2021-04-18 NOTE — Unmapped (Signed)
ORTHOPAEDIC CLINIC NOTE      [place], [place]       Patient Name: Allison Davenport  MRN: [id number]  DOB: 28-Apr-1986    Date of Evaluation: 04/18/2021    PCP: Lorenso Quarry, NP      [facility] NEW CLINIC NOTE [phone])      ASSESSMENT:     Allison Davenport is a 35 y.o. female with the following diagnosis:      ICD-10-CM   1. Hand injury, left, initial encounter  S69.92XA   2. Contusion of left hand, initial encounter  Y78.295A       PLAN:   The diagnosis and treatment plan were discussed.  X-rays were reviewed with the patient.  No evidence of acute fracture.  I discussed the patient that she likely suffered a contusion from her injury today.  As I was discussing a treatment plan with the patient she became very upset and left prior to [place] reviewing her full treatment plan.  - Follow-up plan: No follow-ups on file..      I reviewed the diagnosis and discussed treatment options with the patient. The patient is amenable to the above plan. The patient was instructed to call or return to clinic if symptoms fail to improve as expected, there is any increasing pain, or any other concerns.    MEDICAL DECISION MAKING (level of service defined by 2/3 elements)     Number/Complexity of Problems Addressed 1 acute, uncomplicated illness or injury (99203/99213)   Amount/Complexity of Data to be Reviewed/Analyzed 2 points: Review prior notes (1 point per unique source); Review test results (1 point per unique test); Order tests (1 point per unique test) (99203/99213)   Risk of Complications/Morbidity/Mortality of Management Over-the-counter Medications (99203/99213)       SUBJECTIVE:     History of Present Illness:   Allison Davenport is a 35 y.o. female, PMH psoriatic arthritis, Ehlers-Danlos syndrome, seen in [facility] for evaluation of their chief complaint of left hand injury.  She reports she injured the hand earlier this morning.  She suffered an injury when an exercise bike fell and landed directly on her hand.  She is noted pain in the area since that time.  She does have a history of a left wrist dorsal ganglion excision performed by Dr. Kirt Boys at [facility] on 01/03/2021.  She reports that she continues to have pain in the wrist and hand that she believes is related to her psoriatic arthritis.  Pain has increased since her injury this morning.      Medical History   Past Medical History:   Diagnosis Date   ??? Abnormal ECG     SVT Dysautonomia   ??? Anemia    ??? Arthritis     Psoriatic   ??? Asthma    ??? Brain concussion    ??? Chronic pain syndrome    ??? Disease of thyroid gland     Hashimoto???s   ??? Headache    ??? History of transfusion    ??? Peripheral neuropathy    ??? Pneumonia         Surgical History   Past Surgical History:   Procedure Laterality Date   ??? HYSTERECTOMY          Allergies Hydroxychloroquine, Ondansetron hcl, Latex, and Adhesive tape-silicones   Medications   Current Outpatient Medications   Medication Sig Dispense Refill   ??? acetaminophen (TYLENOL) 500 MG tablet Take 1,000 mg by mouth daily as needed.     ??? albuterol  HFA 90 mcg/actuation inhaler Inhale 1 puff daily as needed.     ??? buPROPion (WELLBUTRIN XL) 150 MG 24 hr tablet Take 150 mg by mouth daily.     ??? calcipotriene (DOVONOX) 0.005 % ointment Apply topically Two (2) times a day. To psoriasis. Mix with Clobetasol ointment. 60 g 3   ??? calcium-vitamin D3-vitamin K 500-100-40 mg-unit-mcg Chew Chew daily.      ??? cholecalciferol, vitamin D3-50 mcg, 2,000 unit,, 50 mcg (2,000 unit) cap 50 mcg daily.      ??? clobetasoL (TEMOVATE) 0.05 % ointment Apply twice a day to affected areas of body (red/raised/itchy) until clear/smooth. Avoid face and folds. 60 g 1   ??? desonide (DESOWEN) 0.05 % ointment Take as directed.      ??? empty container Misc Use as directed to dispose of Cosentyx pens. 1 each 2   ??? levothyroxine (SYNTHROID) 75 MCG tablet Take 1 tablet by mouth every morning 30 to 60 minutes before breakfast.     ??? metoclopramide (REGLAN) 5 MG tablet Take as directed.      ??? NURTEC ODT 75 mg TbDL 75 mg every other day.      ??? onabotulinumtoxinA (BOTOX) 200 unit SolR injection Inject 1 Dose as directed every 3 (three) months.     ??? pediatric multivitamin Chew tablet Chew 1 tablet daily.     ??? pregabalin (LYRICA) 50 MG capsule Take 25 mg by mouth Two (2) times a day.      ??? pregabalin (LYRICA) 50 MG capsule 50 mg Two (2) times a day.      ??? secukinumab (COSENTYX PEN, 2 PENS,) 150 mg/mL PnIj injection Inject the contents of 2 pens (300mg ) under the skin weekly for the first 5 doses. 10 mL 0   ??? secukinumab (COSENTYX PEN, 2 PENS,) 150 mg/mL PnIj injection Inject the contents of 2 pens (300mg ) under the skin every 4 weeks. 2 mL 11     No current facility-administered medications for this visit.      Review of Systems Pertinent positives and negatives are documented in the HPI. All other systems reviewed are negative. Patient was instructed to follow-up with the appropriate provider as necessary for all pertinent positives not related to today's encounter.     Family History Family History   Problem Relation Age of Onset   ??? Multiple sclerosis Mother    ??? Melanoma Mother    ??? Cancer Mother         Melanoma   ??? COPD Mother    ??? Hyperlipidemia Mother    ??? Neuropathy Mother    ??? Osteoporosis Mother    ??? ALS Other    ??? Migraines Father    ??? No Known Problems Sister    ??? No Known Problems Brother    ??? No Known Problems Maternal Aunt    ??? No Known Problems Maternal Uncle    ??? No Known Problems Paternal Aunt    ??? No Known Problems Paternal Uncle    ??? Diabetes Maternal Grandmother    ??? Heart attack Maternal Grandfather    ??? Cancer Paternal Grandmother         Pancreatic   ??? Heart attack Paternal Grandmother    ??? No Known Problems Paternal Grandfather    ??? Anesthesia problems Neg Hx    ??? Broken bones Neg Hx    ??? Cancer Neg Hx    ??? Clotting disorder Neg Hx    ??? Collagen disease Neg Hx    ???  Diabetes Neg Hx    ??? Dislocations Neg Hx    ??? Fibromyalgia Neg Hx    ??? Gout Neg Hx    ??? Hemophilia Neg Hx    ??? Osteoporosis Neg Hx    ??? Rheumatologic disease Neg Hx    ??? Scoliosis Neg Hx    ??? Severe sprains Neg Hx    ??? Sickle cell anemia Neg Hx    ??? Spinal Compression Fracture Neg Hx    ??? Basal cell carcinoma Neg Hx    ??? Squamous cell carcinoma Neg Hx           Social History She reports that she has never smoked. She has never used smokeless tobacco. She reports that she does not drink alcohol and does not use drugs.Home address:[address]  [place] [address]        Occupational History   ??? Not on file    Employment:   Social History     Social History Narrative   ??? Not on file              OBJECTIVE:     DETAILED PHYSICAL EXAM  General Appearance Well developed, well-nourished in NAD   Mood and Affect Alert, Oriented       MUSCULOSKELETAL      Allison Davenport is a well appearing 35 y.o. female in no apparent distress.  Exam of her left upper extremity demonstrates mild ecchymosis at the PIP joints of the fourth and fifth fingers.  No obvious deformity.  No swelling, erythema, increased warmth.  Well-healed surgical incision on the dorsal aspect of the wrist.  Diffuse palpable tenderness about the ulnar aspect of the hand and at the small and ring fingers.  Patient is able to make a full fist with minimal pain.  No instability noted on exam.  Diffuse palpable tenderness about the wrist.  The hand is warm and well perfused. There is good capillary refill. Distal pulses are equal bilaterally.       Test Results  Imaging  3 views of the left hand obtained today independently reviewed and interpreted by myself, demonstrate no acute osseous abnormalities    cc:  Lorenso Quarry, NP

## 2021-04-18 NOTE — Unmapped (Signed)
Refill request for Clobetasol 0.05% Ointment   Patient last seen in clinic 02/23/21  Refill sent for your review

## 2021-04-22 DIAGNOSIS — B372 Candidiasis of skin and nail: Principal | ICD-10-CM

## 2021-04-22 MED ORDER — NYSTATIN 100,000 UNIT/GRAM TOPICAL CREAM
1 refills | 0.00000 days | Status: CP
Start: 2021-04-22 — End: ?

## 2021-04-26 ENCOUNTER — Encounter
Admit: 2021-04-26 | Discharge: 2021-04-27 | Payer: PRIVATE HEALTH INSURANCE | Attending: Dermatology | Primary: Dermatology

## 2021-04-26 DIAGNOSIS — R21 Rash and other nonspecific skin eruption: Secondary | ICD-10-CM | POA: Diagnosis not present

## 2021-04-26 MED ORDER — TRIAMCINOLONE ACETONIDE 0.1 % TOPICAL CREAM
Freq: Two times a day (BID) | TOPICAL | 1 refills | 0.00000 days | Status: CP
Start: 2021-04-26 — End: 2021-05-10

## 2021-04-26 NOTE — Unmapped (Signed)
Dermatology Note     Assessment and Plan    Morbilliform drug eruption  - no sore throat, fever etc to consider scarletina  -given timing, secukinumab is most likely  - d/c secukinumab  - Triamcinolone 0.1% Cream - apply twice daily to areas affected by rash for 10 days, then switch to twice daily, 4-5 days per week as needed.  Apply cream moisturizer over top.  If this is inadequate to control rash, patient directed to contact clinic.  Discussed side effects/signs of misuse such as steroid acne, striae, dyspigmentation and skin thinning.  - RTC 6 weeks, perhaps transition to ixekizumab could be helpful and avoid reaction        RTC: Return in about 6 weeks (around 06/07/2021) for Follow up. or sooner as needed    Chief Complaint:   Chief Complaint   Patient presents with   ??? Lesion Of Concern     rash neck, chest, and arms very itchy          HPI:   This is a 35 y.o. female who presents to [facility] for consultation at the request of Referred Self  for acute visit for erutption.  2 days after starting secukinumab, she had development of rash over the trunk.  With second injectino the rash spread down the arms.  It is very itchy.      No other new, changing, or symptomatic skin lesions of concern are reported today.    Past Medical History, Family History, Social History, Meds, Allergies, Problem List  reviewed in the electronic medical record.       Past Medical History:    Lorinda Creed  Psoriatic Arthritis and psoriasis  POTS        Review of Systems:  Aside from those items mentioned in the HPI, the patient reports no recent fever, no recent chills, no unintended weightloss    Aside from those items mentioned in the HPI, no other skin issues are presented during the visit.    Physical Examination:  General: Well-developed, conversant female in no acute distress.   Neuro : Appropriate affect, Alert and oriented, to person place and time, answers questions appropriately.  Skin: Examination performed included: Examination of the scalp, face, lids, lashes, lips, neck, arms, palms, nails, legs, feet, was performed and the following were noted:  Morbilliform papules and plaques over the trunk densley radiating to medial limbs

## 2021-04-26 NOTE — Unmapped (Addendum)
Stop Cosentyx      Start triamcinolone 0.1% cream twice per day for two weeks, then 5 days per week as needed.    RTC in 6 weeks - we can talk about taltz at that time perhaps

## 2021-04-27 NOTE — Unmapped (Signed)
Specialty Medication(s): Cosentyx    Ms.Judithann Sheen has been dis-enrolled from the [facility] specialty pharmacy services due to medication discontinuation resulting from side effect intolerance. Per derm notes 5/6, patient developed severe rash, and medication has been stopped.     Additional information provided to the patient: na    Neoma Uhrich A Desiree Lucy [facility] Specialty Pharmacist

## 2021-05-04 DIAGNOSIS — R399 Unspecified symptoms and signs involving the genitourinary system: Secondary | ICD-10-CM | POA: Diagnosis not present

## 2021-05-04 DIAGNOSIS — J4520 Mild intermittent asthma, uncomplicated: Secondary | ICD-10-CM | POA: Diagnosis not present

## 2021-05-04 DIAGNOSIS — F4321 Adjustment disorder with depressed mood: Secondary | ICD-10-CM | POA: Diagnosis not present

## 2021-05-04 DIAGNOSIS — E559 Vitamin D deficiency, unspecified: Secondary | ICD-10-CM | POA: Diagnosis not present

## 2021-05-04 DIAGNOSIS — E7800 Pure hypercholesterolemia, unspecified: Secondary | ICD-10-CM | POA: Diagnosis not present

## 2021-05-10 DIAGNOSIS — G43719 Chronic migraine without aura, intractable, without status migrainosus: Secondary | ICD-10-CM | POA: Diagnosis not present

## 2021-05-10 DIAGNOSIS — L4050 Arthropathic psoriasis, unspecified: Secondary | ICD-10-CM | POA: Diagnosis not present

## 2021-05-10 DIAGNOSIS — M7918 Myalgia, other site: Secondary | ICD-10-CM | POA: Diagnosis not present

## 2021-05-10 DIAGNOSIS — M5481 Occipital neuralgia: Secondary | ICD-10-CM | POA: Diagnosis not present

## 2021-05-15 ENCOUNTER — Encounter
Admit: 2021-05-15 | Discharge: 2021-05-16 | Payer: PRIVATE HEALTH INSURANCE | Attending: Orthopaedic Surgery | Primary: Orthopaedic Surgery

## 2021-05-15 DIAGNOSIS — M67432 Ganglion, left wrist: Secondary | ICD-10-CM | POA: Diagnosis not present

## 2021-05-17 ENCOUNTER — Encounter: Admit: 2021-05-17 | Discharge: 2021-05-18 | Payer: PRIVATE HEALTH INSURANCE

## 2021-05-17 DIAGNOSIS — R21 Rash and other nonspecific skin eruption: Secondary | ICD-10-CM | POA: Diagnosis not present

## 2021-05-17 DIAGNOSIS — L308 Other specified dermatitis: Secondary | ICD-10-CM | POA: Diagnosis not present

## 2021-05-17 DIAGNOSIS — M67432 Ganglion, left wrist: Secondary | ICD-10-CM | POA: Diagnosis not present

## 2021-05-17 DIAGNOSIS — L209 Atopic dermatitis, unspecified: Principal | ICD-10-CM

## 2021-05-17 MED ORDER — EUCRISA 2 % TOPICAL OINTMENT
6 refills | 0.00000 days | Status: CP
Start: 2021-05-17 — End: ?
  Filled 2021-06-01: qty 60, 15d supply, fill #0

## 2021-05-28 DIAGNOSIS — R21 Rash and other nonspecific skin eruption: Secondary | ICD-10-CM | POA: Diagnosis not present

## 2021-05-28 DIAGNOSIS — L209 Atopic dermatitis, unspecified: Principal | ICD-10-CM

## 2021-05-29 DIAGNOSIS — B372 Candidiasis of skin and nail: Principal | ICD-10-CM

## 2021-05-29 MED ORDER — NYSTATIN 100,000 UNIT/GRAM TOPICAL CREAM
1 refills | 0.00000 days
Start: 2021-05-29 — End: ?

## 2021-06-09 DIAGNOSIS — G43719 Chronic migraine without aura, intractable, without status migrainosus: Secondary | ICD-10-CM | POA: Diagnosis not present

## 2021-06-11 DIAGNOSIS — E041 Nontoxic single thyroid nodule: Secondary | ICD-10-CM | POA: Diagnosis not present

## 2021-06-12 ENCOUNTER — Ambulatory Visit
Admit: 2021-06-12 | Discharge: 2021-06-13 | Payer: PRIVATE HEALTH INSURANCE | Attending: Internal Medicine | Primary: Internal Medicine

## 2021-06-12 DIAGNOSIS — R21 Rash and other nonspecific skin eruption: Secondary | ICD-10-CM | POA: Diagnosis not present

## 2021-06-12 DIAGNOSIS — L509 Urticaria, unspecified: Secondary | ICD-10-CM | POA: Diagnosis not present

## 2021-06-12 DIAGNOSIS — Z6829 Body mass index (BMI) 29.0-29.9, adult: Secondary | ICD-10-CM | POA: Diagnosis not present

## 2021-06-12 MED ORDER — CROMOLYN 100 MG/5 ML ORAL CONCENTRATE
ORAL | 12 refills | 0.00000 days | Status: CP
Start: 2021-06-12 — End: 2021-06-12

## 2021-06-18 DIAGNOSIS — R21 Rash and other nonspecific skin eruption: Principal | ICD-10-CM

## 2021-06-18 DIAGNOSIS — L509 Urticaria, unspecified: Principal | ICD-10-CM

## 2021-06-18 MED ORDER — CROMOLYN 100 MG/5 ML ORAL CONCENTRATE
12 refills | 0 days | Status: CP
Start: 2021-06-18 — End: ?

## 2021-06-19 ENCOUNTER — Encounter
Admit: 2021-06-19 | Discharge: 2021-06-20 | Payer: PRIVATE HEALTH INSURANCE | Attending: Orthopaedic Surgery | Primary: Orthopaedic Surgery

## 2021-06-19 DIAGNOSIS — M67432 Ganglion, left wrist: Secondary | ICD-10-CM | POA: Diagnosis not present

## 2021-06-20 MED ORDER — METHYLPHENIDATE ER 10 MG TABLET,EXTENDED RELEASE
0 days
Start: 2021-06-20 — End: ?

## 2021-06-21 MED ORDER — ONDANSETRON 4 MG DISINTEGRATING TABLET
ORAL_TABLET | Freq: Three times a day (TID) | ORAL | 0 refills | 3.00000 days | PRN
Start: 2021-06-21 — End: 2021-06-24

## 2021-06-21 MED ORDER — GABAPENTIN 100 MG CAPSULE
ORAL_CAPSULE | Freq: Three times a day (TID) | ORAL | 0 refills | 3.00000 days
Start: 2021-06-21 — End: 2021-06-24

## 2021-06-21 MED ORDER — HYDROCODONE 5 MG-ACETAMINOPHEN 325 MG TABLET
ORAL_TABLET | Freq: Four times a day (QID) | ORAL | 0 refills | 2.00000 days | PRN
Start: 2021-06-21 — End: 2021-06-26

## 2021-06-21 MED ORDER — IBUPROFEN 600 MG TABLET
ORAL_TABLET | Freq: Four times a day (QID) | ORAL | 0 refills | 14.00000 days | PRN
Start: 2021-06-21 — End: 2021-07-05

## 2021-06-25 DIAGNOSIS — L509 Urticaria, unspecified: Principal | ICD-10-CM

## 2021-06-25 DIAGNOSIS — R21 Rash and other nonspecific skin eruption: Principal | ICD-10-CM

## 2021-06-27 DIAGNOSIS — M5481 Occipital neuralgia: Secondary | ICD-10-CM | POA: Diagnosis not present

## 2021-06-27 DIAGNOSIS — R5382 Chronic fatigue, unspecified: Secondary | ICD-10-CM | POA: Diagnosis not present

## 2021-06-27 DIAGNOSIS — L4050 Arthropathic psoriasis, unspecified: Secondary | ICD-10-CM | POA: Diagnosis not present

## 2021-06-27 DIAGNOSIS — G43719 Chronic migraine without aura, intractable, without status migrainosus: Secondary | ICD-10-CM | POA: Diagnosis not present

## 2021-06-30 DIAGNOSIS — M62831 Muscle spasm of calf: Secondary | ICD-10-CM | POA: Diagnosis not present

## 2021-06-30 MED ORDER — DULOXETINE 20 MG CAPSULE,DELAYED RELEASE
0 days
Start: 2021-06-30 — End: ?

## 2021-07-03 ENCOUNTER — Encounter: Admit: 2021-07-03 | Discharge: 2021-07-04 | Payer: PRIVATE HEALTH INSURANCE

## 2021-07-03 DIAGNOSIS — M25361 Other instability, right knee: Secondary | ICD-10-CM | POA: Diagnosis not present

## 2021-07-04 DIAGNOSIS — M25361 Other instability, right knee: Principal | ICD-10-CM

## 2021-07-05 ENCOUNTER — Encounter
Admit: 2021-07-05 | Discharge: 2021-07-06 | Payer: PRIVATE HEALTH INSURANCE | Attending: Physical Medicine & Rehabilitation | Primary: Physical Medicine & Rehabilitation

## 2021-07-05 ENCOUNTER — Encounter: Admit: 2021-07-05 | Discharge: 2021-07-06 | Payer: PRIVATE HEALTH INSURANCE

## 2021-07-05 DIAGNOSIS — L603 Nail dystrophy: Secondary | ICD-10-CM | POA: Diagnosis not present

## 2021-07-05 DIAGNOSIS — L309 Dermatitis, unspecified: Secondary | ICD-10-CM | POA: Diagnosis not present

## 2021-07-05 DIAGNOSIS — D492 Neoplasm of unspecified behavior of bone, soft tissue, and skin: Secondary | ICD-10-CM | POA: Diagnosis not present

## 2021-07-05 DIAGNOSIS — L820 Inflamed seborrheic keratosis: Secondary | ICD-10-CM | POA: Diagnosis not present

## 2021-07-05 DIAGNOSIS — D235 Other benign neoplasm of skin of trunk: Secondary | ICD-10-CM | POA: Diagnosis not present

## 2021-07-05 DIAGNOSIS — R21 Rash and other nonspecific skin eruption: Principal | ICD-10-CM

## 2021-07-06 DIAGNOSIS — M545 Low back pain, unspecified: Secondary | ICD-10-CM | POA: Diagnosis not present

## 2021-07-09 DIAGNOSIS — N390 Urinary tract infection, site not specified: Secondary | ICD-10-CM | POA: Diagnosis not present

## 2021-07-09 DIAGNOSIS — B373 Candidiasis of vulva and vagina: Secondary | ICD-10-CM | POA: Diagnosis not present

## 2021-07-09 DIAGNOSIS — N760 Acute vaginitis: Secondary | ICD-10-CM | POA: Diagnosis not present

## 2021-07-09 DIAGNOSIS — L509 Urticaria, unspecified: Principal | ICD-10-CM

## 2021-07-09 DIAGNOSIS — R21 Rash and other nonspecific skin eruption: Principal | ICD-10-CM

## 2021-07-09 MED ORDER — METRONIDAZOLE 0.75 % VAGINAL GEL
0 days
Start: 2021-07-09 — End: ?

## 2021-07-09 MED ORDER — PREGABALIN 75 MG CAPSULE
Freq: Two times a day (BID) | ORAL | 0 days
Start: 2021-07-09 — End: ?

## 2021-07-09 MED ORDER — CIPROFLOXACIN 500 MG TABLET
0 days
Start: 2021-07-09 — End: ?

## 2021-07-09 MED ORDER — FLUCONAZOLE 150 MG TABLET
0 days
Start: 2021-07-09 — End: ?

## 2021-07-10 ENCOUNTER — Encounter: Admit: 2021-07-10 | Discharge: 2021-07-11 | Payer: PRIVATE HEALTH INSURANCE

## 2021-07-10 ENCOUNTER — Encounter
Admit: 2021-07-10 | Discharge: 2021-07-11 | Payer: PRIVATE HEALTH INSURANCE | Attending: Orthopaedic Surgery | Primary: Orthopaedic Surgery

## 2021-07-10 DIAGNOSIS — M25361 Other instability, right knee: Secondary | ICD-10-CM | POA: Diagnosis not present

## 2021-07-12 DIAGNOSIS — E063 Autoimmune thyroiditis: Secondary | ICD-10-CM | POA: Diagnosis not present

## 2021-07-12 DIAGNOSIS — Z888 Allergy status to other drugs, medicaments and biological substances status: Secondary | ICD-10-CM | POA: Diagnosis not present

## 2021-07-12 DIAGNOSIS — Q796 Ehlers-Danlos syndrome, unspecified: Secondary | ICD-10-CM | POA: Diagnosis not present

## 2021-07-12 DIAGNOSIS — Z79890 Hormone replacement therapy (postmenopausal): Secondary | ICD-10-CM | POA: Diagnosis not present

## 2021-07-12 DIAGNOSIS — R55 Syncope and collapse: Secondary | ICD-10-CM | POA: Diagnosis not present

## 2021-07-12 DIAGNOSIS — J45909 Unspecified asthma, uncomplicated: Secondary | ICD-10-CM | POA: Diagnosis not present

## 2021-07-12 DIAGNOSIS — R42 Dizziness and giddiness: Secondary | ICD-10-CM | POA: Diagnosis not present

## 2021-07-12 DIAGNOSIS — I498 Other specified cardiac arrhythmias: Secondary | ICD-10-CM | POA: Diagnosis not present

## 2021-07-13 ENCOUNTER — Ambulatory Visit
Admit: 2021-07-13 | Discharge: 2021-07-13 | Disposition: A | Discharge status: Home / Self Care | Payer: PRIVATE HEALTH INSURANCE

## 2021-07-13 DIAGNOSIS — R55 Syncope and collapse: Secondary | ICD-10-CM | POA: Diagnosis not present

## 2021-07-16 DIAGNOSIS — R112 Nausea with vomiting, unspecified: Secondary | ICD-10-CM | POA: Diagnosis not present

## 2021-07-16 DIAGNOSIS — M5030 Other cervical disc degeneration, unspecified cervical region: Secondary | ICD-10-CM | POA: Diagnosis not present

## 2021-07-16 DIAGNOSIS — E063 Autoimmune thyroiditis: Secondary | ICD-10-CM | POA: Diagnosis not present

## 2021-07-16 DIAGNOSIS — M5416 Radiculopathy, lumbar region: Secondary | ICD-10-CM | POA: Diagnosis not present

## 2021-07-16 DIAGNOSIS — R140 Abdominal distension (gaseous): Secondary | ICD-10-CM | POA: Diagnosis not present

## 2021-07-16 DIAGNOSIS — E079 Disorder of thyroid, unspecified: Secondary | ICD-10-CM | POA: Diagnosis not present

## 2021-07-16 DIAGNOSIS — M5126 Other intervertebral disc displacement, lumbar region: Secondary | ICD-10-CM | POA: Diagnosis not present

## 2021-07-16 DIAGNOSIS — Z79890 Hormone replacement therapy (postmenopausal): Secondary | ICD-10-CM | POA: Diagnosis not present

## 2021-07-16 DIAGNOSIS — R519 Headache, unspecified: Secondary | ICD-10-CM | POA: Diagnosis not present

## 2021-07-16 DIAGNOSIS — M5136 Other intervertebral disc degeneration, lumbar region: Secondary | ICD-10-CM | POA: Diagnosis not present

## 2021-07-16 DIAGNOSIS — R300 Dysuria: Secondary | ICD-10-CM | POA: Diagnosis not present

## 2021-07-17 ENCOUNTER — Encounter
Admit: 2021-07-17 | Discharge: 2021-07-17 | Disposition: A | Discharge status: Home / Self Care | Payer: PRIVATE HEALTH INSURANCE | Attending: Emergency Medicine

## 2021-07-17 DIAGNOSIS — R112 Nausea with vomiting, unspecified: Secondary | ICD-10-CM | POA: Diagnosis not present

## 2021-07-18 DIAGNOSIS — M25461 Effusion, right knee: Secondary | ICD-10-CM | POA: Diagnosis not present

## 2021-07-18 DIAGNOSIS — M25462 Effusion, left knee: Secondary | ICD-10-CM | POA: Diagnosis not present

## 2021-07-18 DIAGNOSIS — M25561 Pain in right knee: Secondary | ICD-10-CM | POA: Diagnosis not present

## 2021-07-18 DIAGNOSIS — M25562 Pain in left knee: Secondary | ICD-10-CM | POA: Diagnosis not present

## 2021-07-19 ENCOUNTER — Encounter: Admit: 2021-07-19 | Discharge: 2021-07-20 | Payer: PRIVATE HEALTH INSURANCE

## 2021-07-19 ENCOUNTER — Encounter: Admit: 2021-07-19 | Discharge: 2021-07-20 | Payer: PRIVATE HEALTH INSURANCE | Attending: Family | Primary: Family

## 2021-07-19 DIAGNOSIS — R55 Syncope and collapse: Secondary | ICD-10-CM | POA: Diagnosis not present

## 2021-07-19 DIAGNOSIS — R9431 Abnormal electrocardiogram [ECG] [EKG]: Secondary | ICD-10-CM | POA: Diagnosis not present

## 2021-07-19 DIAGNOSIS — E079 Disorder of thyroid, unspecified: Secondary | ICD-10-CM | POA: Diagnosis not present

## 2021-07-19 DIAGNOSIS — M79676 Pain in unspecified toe(s): Secondary | ICD-10-CM | POA: Diagnosis not present

## 2021-07-19 DIAGNOSIS — M7121 Synovial cyst of popliteal space [Baker], right knee: Secondary | ICD-10-CM | POA: Diagnosis not present

## 2021-07-19 DIAGNOSIS — M2550 Pain in unspecified joint: Secondary | ICD-10-CM | POA: Diagnosis not present

## 2021-07-19 DIAGNOSIS — L4050 Arthropathic psoriasis, unspecified: Secondary | ICD-10-CM | POA: Diagnosis not present

## 2021-07-19 DIAGNOSIS — R5383 Other fatigue: Secondary | ICD-10-CM | POA: Diagnosis not present

## 2021-07-19 DIAGNOSIS — M7122 Synovial cyst of popliteal space [Baker], left knee: Secondary | ICD-10-CM | POA: Diagnosis not present

## 2021-07-19 DIAGNOSIS — R091 Pleurisy: Secondary | ICD-10-CM | POA: Diagnosis not present

## 2021-07-19 DIAGNOSIS — M25361 Other instability, right knee: Secondary | ICD-10-CM | POA: Diagnosis not present

## 2021-07-19 DIAGNOSIS — M79672 Pain in left foot: Secondary | ICD-10-CM | POA: Diagnosis not present

## 2021-07-19 DIAGNOSIS — R112 Nausea with vomiting, unspecified: Secondary | ICD-10-CM | POA: Diagnosis not present

## 2021-07-19 DIAGNOSIS — M25562 Pain in left knee: Secondary | ICD-10-CM | POA: Diagnosis not present

## 2021-07-19 DIAGNOSIS — Z6829 Body mass index (BMI) 29.0-29.9, adult: Secondary | ICD-10-CM | POA: Diagnosis not present

## 2021-07-19 DIAGNOSIS — I7300 Raynaud's syndrome without gangrene: Secondary | ICD-10-CM | POA: Diagnosis not present

## 2021-07-19 DIAGNOSIS — M79671 Pain in right foot: Secondary | ICD-10-CM | POA: Diagnosis not present

## 2021-07-19 DIAGNOSIS — M25561 Pain in right knee: Secondary | ICD-10-CM | POA: Diagnosis not present

## 2021-07-19 DIAGNOSIS — G894 Chronic pain syndrome: Secondary | ICD-10-CM | POA: Diagnosis not present

## 2021-07-20 DIAGNOSIS — M7061 Trochanteric bursitis, right hip: Principal | ICD-10-CM

## 2021-07-20 DIAGNOSIS — M25251 Flail joint, right hip: Principal | ICD-10-CM

## 2021-07-24 DIAGNOSIS — G901 Familial dysautonomia [Riley-Day]: Secondary | ICD-10-CM | POA: Diagnosis not present

## 2021-07-24 DIAGNOSIS — G43711 Chronic migraine without aura, intractable, with status migrainosus: Secondary | ICD-10-CM | POA: Diagnosis not present

## 2021-07-30 DIAGNOSIS — M5481 Occipital neuralgia: Secondary | ICD-10-CM | POA: Diagnosis not present

## 2021-07-30 DIAGNOSIS — M542 Cervicalgia: Secondary | ICD-10-CM | POA: Diagnosis not present

## 2021-07-31 DIAGNOSIS — Q796 Ehlers-Danlos syndrome, unspecified: Secondary | ICD-10-CM | POA: Diagnosis not present

## 2021-07-31 DIAGNOSIS — M25372 Other instability, left ankle: Secondary | ICD-10-CM | POA: Diagnosis not present

## 2021-08-04 MED ORDER — ACETAMINOPHEN 500 MG TABLET
ORAL_TABLET | Freq: Three times a day (TID) | ORAL | 0 refills | 5 days
Start: 2021-08-04 — End: 2021-08-09

## 2021-08-04 MED ORDER — ASPIRIN 81 MG TABLET,DELAYED RELEASE
ORAL_TABLET | Freq: Two times a day (BID) | ORAL | 0 refills | 21 days
Start: 2021-08-04 — End: 2021-08-25

## 2021-08-10 ENCOUNTER — Encounter
Admit: 2021-08-10 | Discharge: 2021-08-10 | Payer: PRIVATE HEALTH INSURANCE | Attending: Anesthesiology | Primary: Anesthesiology

## 2021-08-10 ENCOUNTER — Encounter: Admit: 2021-08-10 | Discharge: 2021-08-10 | Payer: PRIVATE HEALTH INSURANCE

## 2021-08-10 ENCOUNTER — Ambulatory Visit: Admit: 2021-08-10 | Discharge: 2021-08-10 | Payer: PRIVATE HEALTH INSURANCE

## 2021-08-10 DIAGNOSIS — Z91040 Latex allergy status: Secondary | ICD-10-CM | POA: Diagnosis not present

## 2021-08-10 DIAGNOSIS — K219 Gastro-esophageal reflux disease without esophagitis: Secondary | ICD-10-CM | POA: Diagnosis not present

## 2021-08-10 DIAGNOSIS — M25361 Other instability, right knee: Secondary | ICD-10-CM | POA: Diagnosis not present

## 2021-08-10 DIAGNOSIS — Q796 Ehlers-Danlos syndrome, unspecified: Secondary | ICD-10-CM | POA: Diagnosis not present

## 2021-08-10 DIAGNOSIS — Z8262 Family history of osteoporosis: Secondary | ICD-10-CM | POA: Diagnosis not present

## 2021-08-10 DIAGNOSIS — R279 Unspecified lack of coordination: Secondary | ICD-10-CM | POA: Diagnosis not present

## 2021-08-10 DIAGNOSIS — G629 Polyneuropathy, unspecified: Secondary | ICD-10-CM | POA: Diagnosis not present

## 2021-08-10 DIAGNOSIS — L4050 Arthropathic psoriasis, unspecified: Secondary | ICD-10-CM | POA: Diagnosis not present

## 2021-08-10 DIAGNOSIS — R2689 Other abnormalities of gait and mobility: Secondary | ICD-10-CM | POA: Diagnosis not present

## 2021-08-10 DIAGNOSIS — E063 Autoimmune thyroiditis: Secondary | ICD-10-CM | POA: Diagnosis not present

## 2021-08-10 DIAGNOSIS — M65861 Other synovitis and tenosynovitis, right lower leg: Secondary | ICD-10-CM | POA: Diagnosis not present

## 2021-08-10 DIAGNOSIS — M6751 Plica syndrome, right knee: Secondary | ICD-10-CM | POA: Diagnosis not present

## 2021-08-10 DIAGNOSIS — Z79899 Other long term (current) drug therapy: Secondary | ICD-10-CM | POA: Diagnosis not present

## 2021-08-10 DIAGNOSIS — Z791 Long term (current) use of non-steroidal anti-inflammatories (NSAID): Secondary | ICD-10-CM | POA: Diagnosis not present

## 2021-08-10 DIAGNOSIS — Z79890 Hormone replacement therapy (postmenopausal): Secondary | ICD-10-CM | POA: Diagnosis not present

## 2021-08-10 DIAGNOSIS — G8918 Other acute postprocedural pain: Secondary | ICD-10-CM | POA: Diagnosis not present

## 2021-08-10 DIAGNOSIS — G894 Chronic pain syndrome: Secondary | ICD-10-CM | POA: Diagnosis not present

## 2021-08-10 MED ORDER — GABAPENTIN 100 MG CAPSULE
ORAL_CAPSULE | Freq: Three times a day (TID) | ORAL | 0 refills | 5 days | Status: CP
Start: 2021-08-10 — End: 2021-08-10

## 2021-08-10 MED ORDER — OXYCODONE 5 MG TABLET
ORAL_TABLET | ORAL | 0 refills | 4 days | Status: CP | PRN
Start: 2021-08-10 — End: 2021-08-15
  Filled 2021-08-10: qty 42, 4d supply, fill #0

## 2021-08-10 MED ORDER — ASPIRIN 81 MG TABLET,DELAYED RELEASE
ORAL_TABLET | Freq: Two times a day (BID) | ORAL | 0 refills | 21 days | Status: CP
Start: 2021-08-10 — End: 2021-08-31

## 2021-08-10 MED ORDER — PROMETHAZINE 12.5 MG TABLET
ORAL_TABLET | Freq: Four times a day (QID) | ORAL | 0 refills | 7 days | Status: CP | PRN
Start: 2021-08-10 — End: 2021-08-24
  Filled 2021-08-10: qty 25, 6d supply, fill #0

## 2021-08-10 MED ORDER — METOCLOPRAMIDE 5 MG TABLET
ORAL_TABLET | Freq: Four times a day (QID) | ORAL | 0 refills | 8 days | Status: CP | PRN
Start: 2021-08-10 — End: ?
  Filled 2021-08-10: qty 30, 7d supply, fill #0

## 2021-08-10 MED ORDER — ONDANSETRON 4 MG DISINTEGRATING TABLET
ORAL_TABLET | Freq: Three times a day (TID) | ORAL | 0 refills | 5 days | Status: CP | PRN
Start: 2021-08-10 — End: 2021-08-10

## 2021-08-10 MED ORDER — ACETAMINOPHEN 500 MG TABLET
ORAL_TABLET | Freq: Three times a day (TID) | ORAL | 0 refills | 5 days | Status: CP
Start: 2021-08-10 — End: 2021-08-15

## 2021-08-16 DIAGNOSIS — N760 Acute vaginitis: Secondary | ICD-10-CM | POA: Diagnosis not present

## 2021-08-16 DIAGNOSIS — B3749 Other urogenital candidiasis: Secondary | ICD-10-CM | POA: Diagnosis not present

## 2021-08-22 DIAGNOSIS — B372 Candidiasis of skin and nail: Principal | ICD-10-CM

## 2021-08-22 MED ORDER — NYSTATIN 100,000 UNIT/GRAM TOPICAL CREAM
0 refills | 0.00000 days
Start: 2021-08-22 — End: ?

## 2021-08-23 ENCOUNTER — Ambulatory Visit: Admit: 2021-08-23 | Discharge: 2021-08-24 | Payer: PRIVATE HEALTH INSURANCE

## 2021-08-23 DIAGNOSIS — M25361 Other instability, right knee: Secondary | ICD-10-CM | POA: Diagnosis not present

## 2021-08-23 MED ORDER — NYSTATIN 100,000 UNIT/GRAM TOPICAL CREAM
6 refills | 0.00000 days | Status: CP
Start: 2021-08-23 — End: ?

## 2021-08-25 ENCOUNTER — Ambulatory Visit: Admit: 2021-08-25 | Payer: PRIVATE HEALTH INSURANCE

## 2021-08-25 ENCOUNTER — Ambulatory Visit
Admit: 2021-08-25 | Payer: PRIVATE HEALTH INSURANCE | Attending: Rehabilitative and Restorative Service Providers" | Primary: Rehabilitative and Restorative Service Providers"

## 2021-08-25 DIAGNOSIS — G8929 Other chronic pain: Secondary | ICD-10-CM | POA: Diagnosis not present

## 2021-08-25 DIAGNOSIS — M25661 Stiffness of right knee, not elsewhere classified: Secondary | ICD-10-CM | POA: Diagnosis not present

## 2021-08-25 DIAGNOSIS — R2681 Unsteadiness on feet: Secondary | ICD-10-CM | POA: Diagnosis not present

## 2021-08-25 DIAGNOSIS — M25561 Pain in right knee: Secondary | ICD-10-CM | POA: Diagnosis not present

## 2021-08-29 DIAGNOSIS — M5481 Occipital neuralgia: Secondary | ICD-10-CM | POA: Diagnosis not present

## 2021-08-29 DIAGNOSIS — G44099 Other trigeminal autonomic cephalgias (TAC), not intractable: Secondary | ICD-10-CM | POA: Diagnosis not present

## 2021-08-29 DIAGNOSIS — S7411XA Injury of femoral nerve at hip and thigh level, right leg, initial encounter: Secondary | ICD-10-CM | POA: Diagnosis not present

## 2021-08-29 DIAGNOSIS — G43719 Chronic migraine without aura, intractable, without status migrainosus: Secondary | ICD-10-CM | POA: Diagnosis not present

## 2021-08-31 DIAGNOSIS — R2681 Unsteadiness on feet: Secondary | ICD-10-CM | POA: Diagnosis not present

## 2021-08-31 DIAGNOSIS — M25561 Pain in right knee: Secondary | ICD-10-CM | POA: Diagnosis not present

## 2021-08-31 DIAGNOSIS — M25661 Stiffness of right knee, not elsewhere classified: Secondary | ICD-10-CM | POA: Diagnosis not present

## 2021-08-31 DIAGNOSIS — G8929 Other chronic pain: Secondary | ICD-10-CM | POA: Diagnosis not present

## 2021-09-02 MED ORDER — ONDANSETRON 4 MG DISINTEGRATING TABLET
ORAL_TABLET | Freq: Three times a day (TID) | ORAL | 0 refills | 3 days | PRN
Start: 2021-09-02 — End: 2021-09-05

## 2021-09-02 MED ORDER — HYDROCODONE 5 MG-ACETAMINOPHEN 325 MG TABLET
ORAL_TABLET | Freq: Four times a day (QID) | ORAL | 0 refills | 2.00000 days | PRN
Start: 2021-09-02 — End: 2021-09-07

## 2021-09-02 MED ORDER — GABAPENTIN 100 MG CAPSULE
ORAL_CAPSULE | Freq: Three times a day (TID) | ORAL | 0 refills | 3 days
Start: 2021-09-02 — End: 2021-09-05

## 2021-09-02 MED ORDER — DOCUSATE SODIUM 100 MG CAPSULE
ORAL_CAPSULE | Freq: Two times a day (BID) | ORAL | 0 refills | 14 days
Start: 2021-09-02 — End: 2021-09-16

## 2021-09-02 MED ORDER — IBUPROFEN 600 MG TABLET
ORAL_TABLET | Freq: Four times a day (QID) | ORAL | 0 refills | 14 days | PRN
Start: 2021-09-02 — End: 2021-09-16

## 2021-09-05 ENCOUNTER — Encounter
Admit: 2021-09-05 | Discharge: 2021-09-05 | Payer: PRIVATE HEALTH INSURANCE | Attending: Student in an Organized Health Care Education/Training Program | Primary: Student in an Organized Health Care Education/Training Program

## 2021-09-05 ENCOUNTER — Ambulatory Visit: Admit: 2021-09-05 | Discharge: 2021-09-05 | Payer: PRIVATE HEALTH INSURANCE

## 2021-09-05 DIAGNOSIS — Z79890 Hormone replacement therapy (postmenopausal): Secondary | ICD-10-CM | POA: Diagnosis not present

## 2021-09-05 DIAGNOSIS — I471 Supraventricular tachycardia: Secondary | ICD-10-CM | POA: Diagnosis not present

## 2021-09-05 DIAGNOSIS — M67432 Ganglion, left wrist: Secondary | ICD-10-CM | POA: Diagnosis not present

## 2021-09-05 DIAGNOSIS — L4050 Arthropathic psoriasis, unspecified: Secondary | ICD-10-CM | POA: Diagnosis not present

## 2021-09-05 DIAGNOSIS — J45909 Unspecified asthma, uncomplicated: Secondary | ICD-10-CM | POA: Diagnosis not present

## 2021-09-05 DIAGNOSIS — Q796 Ehlers-Danlos syndrome, unspecified: Secondary | ICD-10-CM | POA: Diagnosis not present

## 2021-09-05 DIAGNOSIS — D8940 Mast cell activation, unspecified: Secondary | ICD-10-CM | POA: Diagnosis not present

## 2021-09-05 DIAGNOSIS — G894 Chronic pain syndrome: Secondary | ICD-10-CM | POA: Diagnosis not present

## 2021-09-05 DIAGNOSIS — E063 Autoimmune thyroiditis: Secondary | ICD-10-CM | POA: Diagnosis not present

## 2021-09-05 DIAGNOSIS — K219 Gastro-esophageal reflux disease without esophagitis: Secondary | ICD-10-CM | POA: Diagnosis not present

## 2021-09-05 DIAGNOSIS — I4581 Long QT syndrome: Secondary | ICD-10-CM | POA: Diagnosis not present

## 2021-09-05 DIAGNOSIS — Z79899 Other long term (current) drug therapy: Secondary | ICD-10-CM | POA: Diagnosis not present

## 2021-09-05 DIAGNOSIS — G901 Familial dysautonomia [Riley-Day]: Secondary | ICD-10-CM | POA: Diagnosis not present

## 2021-09-05 DIAGNOSIS — M2351 Chronic instability of knee, right knee: Secondary | ICD-10-CM | POA: Diagnosis not present

## 2021-09-05 DIAGNOSIS — G629 Polyneuropathy, unspecified: Secondary | ICD-10-CM | POA: Diagnosis not present

## 2021-09-05 MED ORDER — DOCUSATE SODIUM 100 MG CAPSULE
ORAL_CAPSULE | Freq: Two times a day (BID) | ORAL | 0 refills | 14.00000 days | Status: CP
Start: 2021-09-05 — End: 2021-09-19

## 2021-09-05 MED ORDER — HYDROCODONE 5 MG-ACETAMINOPHEN 325 MG TABLET
ORAL_TABLET | Freq: Four times a day (QID) | ORAL | 0 refills | 2.00000 days | Status: CP | PRN
Start: 2021-09-05 — End: 2021-09-10
  Filled 2021-09-05: qty 5, 1d supply, fill #0

## 2021-09-05 MED ORDER — IBUPROFEN 600 MG TABLET
ORAL_TABLET | Freq: Four times a day (QID) | ORAL | 0 refills | 14.00000 days | Status: CP | PRN
Start: 2021-09-05 — End: 2021-09-19

## 2021-09-06 ENCOUNTER — Ambulatory Visit: Admit: 2021-09-06 | Discharge: 2021-09-07 | Payer: PRIVATE HEALTH INSURANCE

## 2021-09-06 DIAGNOSIS — D235 Other benign neoplasm of skin of trunk: Secondary | ICD-10-CM | POA: Diagnosis not present

## 2021-09-06 DIAGNOSIS — L309 Dermatitis, unspecified: Secondary | ICD-10-CM | POA: Diagnosis not present

## 2021-09-06 DIAGNOSIS — L720 Epidermal cyst: Secondary | ICD-10-CM | POA: Diagnosis not present

## 2021-09-06 DIAGNOSIS — D492 Neoplasm of unspecified behavior of bone, soft tissue, and skin: Secondary | ICD-10-CM | POA: Diagnosis not present

## 2021-09-06 DIAGNOSIS — R21 Rash and other nonspecific skin eruption: Principal | ICD-10-CM

## 2021-09-07 MED ORDER — OXYCODONE 5 MG TABLET
ORAL_TABLET | ORAL | 0 refills | 1 days | Status: CP | PRN
Start: 2021-09-07 — End: 2021-09-14

## 2021-09-15 DIAGNOSIS — G8929 Other chronic pain: Secondary | ICD-10-CM | POA: Diagnosis not present

## 2021-09-15 DIAGNOSIS — M25661 Stiffness of right knee, not elsewhere classified: Secondary | ICD-10-CM | POA: Diagnosis not present

## 2021-09-15 DIAGNOSIS — R2681 Unsteadiness on feet: Secondary | ICD-10-CM | POA: Diagnosis not present

## 2021-09-15 DIAGNOSIS — M25561 Pain in right knee: Secondary | ICD-10-CM | POA: Diagnosis not present

## 2021-09-18 ENCOUNTER — Ambulatory Visit
Admit: 2021-09-18 | Discharge: 2021-09-19 | Payer: PRIVATE HEALTH INSURANCE | Attending: Orthopaedic Surgery | Primary: Orthopaedic Surgery

## 2021-09-18 DIAGNOSIS — M67432 Ganglion, left wrist: Secondary | ICD-10-CM | POA: Diagnosis not present

## 2021-09-24 ENCOUNTER — Ambulatory Visit: Admit: 2021-09-24 | Discharge: 2021-09-25 | Payer: PRIVATE HEALTH INSURANCE

## 2021-09-24 DIAGNOSIS — G629 Polyneuropathy, unspecified: Secondary | ICD-10-CM | POA: Diagnosis not present

## 2021-09-24 DIAGNOSIS — M25551 Pain in right hip: Secondary | ICD-10-CM | POA: Diagnosis not present

## 2021-09-24 MED ORDER — DICLOFENAC SODIUM 75 MG TABLET,DELAYED RELEASE
ORAL_TABLET | Freq: Two times a day (BID) | ORAL | 2 refills | 30.00000 days | Status: CP
Start: 2021-09-24 — End: 2021-10-24

## 2021-09-27 DIAGNOSIS — Q796 Ehlers-Danlos syndrome, unspecified: Secondary | ICD-10-CM | POA: Diagnosis not present

## 2021-09-27 DIAGNOSIS — M359 Systemic involvement of connective tissue, unspecified: Secondary | ICD-10-CM | POA: Diagnosis not present

## 2021-09-27 DIAGNOSIS — G90A Postural orthostatic tachycardia syndrome (POTS): Secondary | ICD-10-CM | POA: Diagnosis not present

## 2021-09-27 DIAGNOSIS — L4050 Arthropathic psoriasis, unspecified: Secondary | ICD-10-CM | POA: Diagnosis not present

## 2021-10-02 DIAGNOSIS — M5126 Other intervertebral disc displacement, lumbar region: Secondary | ICD-10-CM | POA: Diagnosis not present

## 2021-10-02 DIAGNOSIS — M5416 Radiculopathy, lumbar region: Secondary | ICD-10-CM | POA: Diagnosis not present

## 2021-10-04 DIAGNOSIS — R768 Other specified abnormal immunological findings in serum: Secondary | ICD-10-CM | POA: Diagnosis not present

## 2021-10-11 DIAGNOSIS — M5412 Radiculopathy, cervical region: Secondary | ICD-10-CM | POA: Diagnosis not present

## 2021-10-15 ENCOUNTER — Ambulatory Visit: Admit: 2021-10-15 | Discharge: 2021-10-16 | Payer: PRIVATE HEALTH INSURANCE

## 2021-10-15 DIAGNOSIS — R21 Rash and other nonspecific skin eruption: Secondary | ICD-10-CM | POA: Diagnosis not present

## 2021-10-16 ENCOUNTER — Ambulatory Visit
Admit: 2021-10-16 | Payer: PRIVATE HEALTH INSURANCE | Attending: Rehabilitative and Restorative Service Providers" | Primary: Rehabilitative and Restorative Service Providers"

## 2021-10-17 DIAGNOSIS — R200 Anesthesia of skin: Secondary | ICD-10-CM | POA: Diagnosis not present

## 2021-10-19 ENCOUNTER — Ambulatory Visit
Admit: 2021-10-19 | Discharge: 2021-10-20 | Payer: PRIVATE HEALTH INSURANCE | Attending: Orthopaedic Surgery | Primary: Orthopaedic Surgery

## 2021-10-25 DIAGNOSIS — M5481 Occipital neuralgia: Secondary | ICD-10-CM | POA: Diagnosis not present

## 2021-10-25 DIAGNOSIS — M542 Cervicalgia: Secondary | ICD-10-CM | POA: Diagnosis not present

## 2021-10-26 DIAGNOSIS — E039 Hypothyroidism, unspecified: Secondary | ICD-10-CM | POA: Diagnosis not present

## 2021-10-30 DIAGNOSIS — R091 Pleurisy: Secondary | ICD-10-CM | POA: Diagnosis not present

## 2021-10-30 DIAGNOSIS — M359 Systemic involvement of connective tissue, unspecified: Secondary | ICD-10-CM | POA: Diagnosis not present

## 2021-10-31 ENCOUNTER — Ambulatory Visit
Admit: 2021-10-31 | Discharge: 2021-11-01 | Payer: PRIVATE HEALTH INSURANCE | Attending: Student in an Organized Health Care Education/Training Program | Primary: Student in an Organized Health Care Education/Training Program

## 2021-10-31 ENCOUNTER — Ambulatory Visit: Admit: 2021-10-31 | Discharge: 2021-11-01 | Payer: PRIVATE HEALTH INSURANCE

## 2021-10-31 DIAGNOSIS — M25551 Pain in right hip: Secondary | ICD-10-CM | POA: Diagnosis not present

## 2021-10-31 DIAGNOSIS — M76891 Other specified enthesopathies of right lower limb, excluding foot: Secondary | ICD-10-CM | POA: Diagnosis not present

## 2021-11-05 DIAGNOSIS — J4520 Mild intermittent asthma, uncomplicated: Secondary | ICD-10-CM | POA: Diagnosis not present

## 2021-11-05 DIAGNOSIS — G90A Postural orthostatic tachycardia syndrome (POTS): Secondary | ICD-10-CM | POA: Diagnosis not present

## 2021-11-05 DIAGNOSIS — I471 Supraventricular tachycardia: Secondary | ICD-10-CM | POA: Diagnosis not present

## 2021-11-05 DIAGNOSIS — I7300 Raynaud's syndrome without gangrene: Secondary | ICD-10-CM | POA: Diagnosis not present

## 2021-11-05 DIAGNOSIS — Z23 Encounter for immunization: Secondary | ICD-10-CM | POA: Diagnosis not present

## 2021-11-05 DIAGNOSIS — E559 Vitamin D deficiency, unspecified: Secondary | ICD-10-CM | POA: Diagnosis not present

## 2021-11-05 DIAGNOSIS — Z0000 Encounter for general adult medical examination without abnormal findings: Secondary | ICD-10-CM | POA: Diagnosis not present

## 2021-11-05 DIAGNOSIS — Z131 Encounter for screening for diabetes mellitus: Secondary | ICD-10-CM | POA: Diagnosis not present

## 2021-11-05 DIAGNOSIS — R200 Anesthesia of skin: Secondary | ICD-10-CM | POA: Diagnosis not present

## 2021-11-05 DIAGNOSIS — E063 Autoimmune thyroiditis: Secondary | ICD-10-CM | POA: Diagnosis not present

## 2021-11-05 DIAGNOSIS — E7800 Pure hypercholesterolemia, unspecified: Secondary | ICD-10-CM | POA: Diagnosis not present

## 2021-11-08 ENCOUNTER — Ambulatory Visit: Admit: 2021-11-08 | Discharge: 2021-11-09 | Payer: PRIVATE HEALTH INSURANCE

## 2021-11-09 DIAGNOSIS — M25362 Other instability, left knee: Principal | ICD-10-CM

## 2021-11-20 DIAGNOSIS — M5010 Cervical disc disorder with radiculopathy, unspecified cervical region: Secondary | ICD-10-CM | POA: Diagnosis not present

## 2021-11-20 DIAGNOSIS — M5030 Other cervical disc degeneration, unspecified cervical region: Secondary | ICD-10-CM | POA: Diagnosis not present

## 2021-11-20 DIAGNOSIS — M5412 Radiculopathy, cervical region: Secondary | ICD-10-CM | POA: Diagnosis not present

## 2021-11-20 DIAGNOSIS — Z8739 Personal history of other diseases of the musculoskeletal system and connective tissue: Secondary | ICD-10-CM | POA: Diagnosis not present

## 2021-11-22 HISTORY — PX: KNEE SURGERY: SHX244

## 2021-11-23 DIAGNOSIS — M5412 Radiculopathy, cervical region: Principal | ICD-10-CM

## 2021-11-23 DIAGNOSIS — M5030 Other cervical disc degeneration, unspecified cervical region: Principal | ICD-10-CM

## 2021-12-02 MED ORDER — OXYCODONE 5 MG TABLET
ORAL_TABLET | ORAL | 0 refills | 5 days | PRN
Start: 2021-12-02 — End: 2021-12-07

## 2021-12-02 MED ORDER — GABAPENTIN 100 MG CAPSULE
ORAL_CAPSULE | Freq: Three times a day (TID) | ORAL | 0 refills | 5.00000 days
Start: 2021-12-02 — End: 2021-12-07

## 2021-12-02 MED ORDER — ACETAMINOPHEN 500 MG TABLET
ORAL_TABLET | Freq: Three times a day (TID) | ORAL | 0 refills | 14 days
Start: 2021-12-02 — End: 2021-12-16

## 2021-12-02 MED ORDER — DOCUSATE SODIUM 100 MG CAPSULE
ORAL_CAPSULE | Freq: Two times a day (BID) | ORAL | 0 refills | 28 days | PRN
Start: 2021-12-02 — End: 2021-12-30

## 2021-12-02 MED ORDER — PROMETHAZINE 12.5 MG TABLET
ORAL_TABLET | Freq: Four times a day (QID) | ORAL | 0 refills | 5.00000 days | PRN
Start: 2021-12-02 — End: 2021-12-07

## 2021-12-02 MED ORDER — ASPIRIN 81 MG TABLET,DELAYED RELEASE
ORAL_TABLET | Freq: Every day | ORAL | 0 refills | 30 days
Start: 2021-12-02 — End: 2022-01-01

## 2021-12-05 ENCOUNTER — Ambulatory Visit: Admit: 2021-12-05 | Discharge: 2021-12-06 | Payer: PRIVATE HEALTH INSURANCE

## 2021-12-05 DIAGNOSIS — M5010 Cervical disc disorder with radiculopathy, unspecified cervical region: Secondary | ICD-10-CM | POA: Diagnosis not present

## 2021-12-05 DIAGNOSIS — M5030 Other cervical disc degeneration, unspecified cervical region: Secondary | ICD-10-CM | POA: Diagnosis not present

## 2021-12-05 DIAGNOSIS — M76891 Other specified enthesopathies of right lower limb, excluding foot: Secondary | ICD-10-CM | POA: Diagnosis not present

## 2021-12-05 DIAGNOSIS — M25551 Pain in right hip: Secondary | ICD-10-CM | POA: Diagnosis not present

## 2021-12-05 DIAGNOSIS — M5412 Radiculopathy, cervical region: Secondary | ICD-10-CM | POA: Diagnosis not present

## 2021-12-07 ENCOUNTER — Encounter
Admit: 2021-12-07 | Discharge: 2021-12-07 | Payer: PRIVATE HEALTH INSURANCE | Attending: Student in an Organized Health Care Education/Training Program | Primary: Student in an Organized Health Care Education/Training Program

## 2021-12-07 ENCOUNTER — Ambulatory Visit: Admit: 2021-12-07 | Discharge: 2021-12-07 | Payer: PRIVATE HEALTH INSURANCE

## 2021-12-07 DIAGNOSIS — G8918 Other acute postprocedural pain: Secondary | ICD-10-CM | POA: Diagnosis not present

## 2021-12-07 DIAGNOSIS — E063 Autoimmune thyroiditis: Secondary | ICD-10-CM | POA: Diagnosis not present

## 2021-12-07 DIAGNOSIS — K219 Gastro-esophageal reflux disease without esophagitis: Secondary | ICD-10-CM | POA: Diagnosis not present

## 2021-12-07 DIAGNOSIS — Q796 Ehlers-Danlos syndrome, unspecified: Secondary | ICD-10-CM | POA: Diagnosis not present

## 2021-12-07 DIAGNOSIS — M5412 Radiculopathy, cervical region: Secondary | ICD-10-CM | POA: Diagnosis not present

## 2021-12-07 DIAGNOSIS — Z888 Allergy status to other drugs, medicaments and biological substances status: Secondary | ICD-10-CM | POA: Diagnosis not present

## 2021-12-07 DIAGNOSIS — G43909 Migraine, unspecified, not intractable, without status migrainosus: Secondary | ICD-10-CM | POA: Diagnosis not present

## 2021-12-07 DIAGNOSIS — E785 Hyperlipidemia, unspecified: Secondary | ICD-10-CM | POA: Diagnosis not present

## 2021-12-07 DIAGNOSIS — M25362 Other instability, left knee: Secondary | ICD-10-CM | POA: Diagnosis not present

## 2021-12-07 DIAGNOSIS — J45909 Unspecified asthma, uncomplicated: Secondary | ICD-10-CM | POA: Diagnosis not present

## 2021-12-07 DIAGNOSIS — I499 Cardiac arrhythmia, unspecified: Secondary | ICD-10-CM | POA: Diagnosis not present

## 2021-12-07 DIAGNOSIS — M2352 Chronic instability of knee, left knee: Secondary | ICD-10-CM | POA: Diagnosis not present

## 2021-12-07 DIAGNOSIS — M65862 Other synovitis and tenosynovitis, left lower leg: Secondary | ICD-10-CM | POA: Diagnosis not present

## 2021-12-07 DIAGNOSIS — F32A Depression, unspecified: Secondary | ICD-10-CM | POA: Diagnosis not present

## 2021-12-07 DIAGNOSIS — Z87440 Personal history of urinary (tract) infections: Secondary | ICD-10-CM | POA: Diagnosis not present

## 2021-12-07 MED ORDER — PROMETHAZINE 12.5 MG TABLET
ORAL_TABLET | Freq: Four times a day (QID) | ORAL | 0 refills | 5 days | Status: CP | PRN
Start: 2021-12-07 — End: 2021-12-12
  Filled 2021-12-07: qty 20, 5d supply, fill #0

## 2021-12-07 MED ORDER — OXYCODONE 5 MG TABLET
ORAL_TABLET | ORAL | 0 refills | 5 days | Status: CP | PRN
Start: 2021-12-07 — End: 2021-12-12
  Filled 2021-12-07: qty 30, 5d supply, fill #0

## 2021-12-08 ENCOUNTER — Emergency Department
Admit: 2021-12-08 | Discharge: 2021-12-08 | Disposition: A | Discharge status: Home / Self Care | Payer: PRIVATE HEALTH INSURANCE

## 2021-12-08 ENCOUNTER — Ambulatory Visit
Admit: 2021-12-08 | Discharge: 2021-12-08 | Disposition: A | Discharge status: Home / Self Care | Payer: PRIVATE HEALTH INSURANCE

## 2021-12-08 DIAGNOSIS — L409 Psoriasis, unspecified: Secondary | ICD-10-CM | POA: Diagnosis not present

## 2021-12-08 DIAGNOSIS — Z888 Allergy status to other drugs, medicaments and biological substances status: Secondary | ICD-10-CM | POA: Diagnosis not present

## 2021-12-08 DIAGNOSIS — Q796 Ehlers-Danlos syndrome, unspecified: Secondary | ICD-10-CM | POA: Diagnosis not present

## 2021-12-08 DIAGNOSIS — M25552 Pain in left hip: Secondary | ICD-10-CM | POA: Diagnosis not present

## 2021-12-08 DIAGNOSIS — E7800 Pure hypercholesterolemia, unspecified: Secondary | ICD-10-CM | POA: Diagnosis not present

## 2021-12-08 DIAGNOSIS — S73005A Unspecified dislocation of left hip, initial encounter: Secondary | ICD-10-CM | POA: Diagnosis not present

## 2021-12-08 DIAGNOSIS — M1990 Unspecified osteoarthritis, unspecified site: Secondary | ICD-10-CM | POA: Diagnosis not present

## 2021-12-08 DIAGNOSIS — M25462 Effusion, left knee: Secondary | ICD-10-CM | POA: Diagnosis not present

## 2021-12-08 DIAGNOSIS — L910 Hypertrophic scar: Secondary | ICD-10-CM | POA: Diagnosis not present

## 2021-12-08 DIAGNOSIS — E079 Disorder of thyroid, unspecified: Secondary | ICD-10-CM | POA: Diagnosis not present

## 2021-12-08 DIAGNOSIS — G629 Polyneuropathy, unspecified: Secondary | ICD-10-CM | POA: Diagnosis not present

## 2021-12-08 DIAGNOSIS — M25359 Other instability, unspecified hip: Secondary | ICD-10-CM | POA: Diagnosis not present

## 2021-12-08 DIAGNOSIS — M329 Systemic lupus erythematosus, unspecified: Secondary | ICD-10-CM | POA: Diagnosis not present

## 2021-12-08 DIAGNOSIS — M25562 Pain in left knee: Secondary | ICD-10-CM | POA: Diagnosis not present

## 2021-12-08 DIAGNOSIS — Z9889 Other specified postprocedural states: Secondary | ICD-10-CM | POA: Diagnosis not present

## 2021-12-08 DIAGNOSIS — G894 Chronic pain syndrome: Secondary | ICD-10-CM | POA: Diagnosis not present

## 2021-12-10 DIAGNOSIS — N390 Urinary tract infection, site not specified: Secondary | ICD-10-CM | POA: Diagnosis not present

## 2021-12-13 ENCOUNTER — Ambulatory Visit: Admit: 2021-12-13 | Discharge: 2021-12-13 | Payer: PRIVATE HEALTH INSURANCE

## 2021-12-13 DIAGNOSIS — M542 Cervicalgia: Secondary | ICD-10-CM | POA: Diagnosis not present

## 2021-12-13 DIAGNOSIS — E063 Autoimmune thyroiditis: Secondary | ICD-10-CM | POA: Diagnosis not present

## 2021-12-13 DIAGNOSIS — L309 Dermatitis, unspecified: Secondary | ICD-10-CM | POA: Diagnosis not present

## 2021-12-13 DIAGNOSIS — M328 Other forms of systemic lupus erythematosus: Secondary | ICD-10-CM | POA: Diagnosis not present

## 2021-12-13 DIAGNOSIS — G629 Polyneuropathy, unspecified: Secondary | ICD-10-CM | POA: Diagnosis not present

## 2021-12-13 DIAGNOSIS — D492 Neoplasm of unspecified behavior of bone, soft tissue, and skin: Secondary | ICD-10-CM | POA: Diagnosis not present

## 2021-12-13 DIAGNOSIS — D2239 Melanocytic nevi of other parts of face: Secondary | ICD-10-CM | POA: Diagnosis not present

## 2021-12-13 DIAGNOSIS — Q796 Ehlers-Danlos syndrome, unspecified: Secondary | ICD-10-CM | POA: Diagnosis not present

## 2021-12-13 DIAGNOSIS — Z79890 Hormone replacement therapy (postmenopausal): Secondary | ICD-10-CM | POA: Diagnosis not present

## 2021-12-13 DIAGNOSIS — Z90710 Acquired absence of both cervix and uterus: Secondary | ICD-10-CM | POA: Diagnosis not present

## 2021-12-13 DIAGNOSIS — E7800 Pure hypercholesterolemia, unspecified: Secondary | ICD-10-CM | POA: Diagnosis not present

## 2021-12-13 DIAGNOSIS — G894 Chronic pain syndrome: Secondary | ICD-10-CM | POA: Diagnosis not present

## 2021-12-13 DIAGNOSIS — L308 Other specified dermatitis: Secondary | ICD-10-CM | POA: Diagnosis not present

## 2021-12-13 DIAGNOSIS — L409 Psoriasis, unspecified: Secondary | ICD-10-CM | POA: Diagnosis not present

## 2021-12-13 DIAGNOSIS — Z79899 Other long term (current) drug therapy: Secondary | ICD-10-CM | POA: Diagnosis not present

## 2021-12-13 DIAGNOSIS — J45909 Unspecified asthma, uncomplicated: Secondary | ICD-10-CM | POA: Diagnosis not present

## 2021-12-13 DIAGNOSIS — Z91040 Latex allergy status: Secondary | ICD-10-CM | POA: Diagnosis not present

## 2021-12-13 DIAGNOSIS — R21 Rash and other nonspecific skin eruption: Principal | ICD-10-CM

## 2021-12-14 ENCOUNTER — Telehealth
Admit: 2021-12-14 | Discharge: 2021-12-15 | Payer: PRIVATE HEALTH INSURANCE | Attending: Student in an Organized Health Care Education/Training Program | Primary: Student in an Organized Health Care Education/Training Program

## 2021-12-14 DIAGNOSIS — M25551 Pain in right hip: Secondary | ICD-10-CM | POA: Diagnosis not present

## 2021-12-14 DIAGNOSIS — M76891 Other specified enthesopathies of right lower limb, excluding foot: Secondary | ICD-10-CM | POA: Diagnosis not present

## 2021-12-20 ENCOUNTER — Ambulatory Visit: Admit: 2021-12-20 | Discharge: 2021-12-21 | Payer: PRIVATE HEALTH INSURANCE

## 2021-12-23 HISTORY — PX: KNEE SURGERY: SHX244

## 2022-01-02 ENCOUNTER — Ambulatory Visit: Admit: 2022-01-02 | Discharge: 2022-01-03 | Payer: PRIVATE HEALTH INSURANCE

## 2022-01-02 DIAGNOSIS — G8929 Other chronic pain: Secondary | ICD-10-CM | POA: Diagnosis not present

## 2022-01-02 DIAGNOSIS — M5442 Lumbago with sciatica, left side: Secondary | ICD-10-CM | POA: Diagnosis not present

## 2022-01-02 MED ORDER — DICLOFENAC SODIUM 75 MG TABLET,DELAYED RELEASE
ORAL_TABLET | Freq: Two times a day (BID) | ORAL | 2 refills | 30 days | Status: CP
Start: 2022-01-02 — End: 2022-02-01

## 2022-01-02 MED ORDER — METHYLPREDNISOLONE 4 MG TABLETS IN A DOSE PACK
0 refills | 0 days | Status: CP
Start: 2022-01-02 — End: ?

## 2022-01-03 ENCOUNTER — Ambulatory Visit: Admit: 2022-01-03 | Discharge: 2022-01-04 | Payer: PRIVATE HEALTH INSURANCE

## 2022-01-03 DIAGNOSIS — M5416 Radiculopathy, lumbar region: Secondary | ICD-10-CM | POA: Diagnosis not present

## 2022-01-03 DIAGNOSIS — Z6828 Body mass index (BMI) 28.0-28.9, adult: Secondary | ICD-10-CM | POA: Diagnosis not present

## 2022-01-03 DIAGNOSIS — M5481 Occipital neuralgia: Secondary | ICD-10-CM | POA: Diagnosis not present

## 2022-01-03 DIAGNOSIS — M5412 Radiculopathy, cervical region: Secondary | ICD-10-CM | POA: Diagnosis not present

## 2022-01-10 DIAGNOSIS — M5412 Radiculopathy, cervical region: Secondary | ICD-10-CM | POA: Diagnosis not present

## 2022-01-10 DIAGNOSIS — M5416 Radiculopathy, lumbar region: Secondary | ICD-10-CM | POA: Diagnosis not present

## 2022-01-10 DIAGNOSIS — Q796 Ehlers-Danlos syndrome, unspecified: Secondary | ICD-10-CM | POA: Diagnosis not present

## 2022-01-10 DIAGNOSIS — M542 Cervicalgia: Secondary | ICD-10-CM | POA: Diagnosis not present

## 2022-01-14 DIAGNOSIS — M5416 Radiculopathy, lumbar region: Principal | ICD-10-CM

## 2022-01-28 ENCOUNTER — Ambulatory Visit
Admit: 2022-01-28 | Discharge: 2022-01-28 | Payer: PRIVATE HEALTH INSURANCE | Attending: Anesthesiology | Primary: Anesthesiology

## 2022-01-28 DIAGNOSIS — M5481 Occipital neuralgia: Secondary | ICD-10-CM | POA: Diagnosis not present

## 2022-01-28 DIAGNOSIS — M5412 Radiculopathy, cervical region: Secondary | ICD-10-CM | POA: Diagnosis not present

## 2022-01-28 DIAGNOSIS — Q796 Ehlers-Danlos syndrome, unspecified: Secondary | ICD-10-CM | POA: Diagnosis not present

## 2022-01-28 DIAGNOSIS — G894 Chronic pain syndrome: Secondary | ICD-10-CM | POA: Diagnosis not present

## 2022-01-28 DIAGNOSIS — M5416 Radiculopathy, lumbar region: Secondary | ICD-10-CM | POA: Diagnosis not present

## 2022-01-28 MED ORDER — PREGABALIN 75 MG CAPSULE
ORAL_CAPSULE | Freq: Two times a day (BID) | ORAL | 5 refills | 30 days | Status: CP
Start: 2022-01-28 — End: 2022-07-27

## 2022-01-31 ENCOUNTER — Ambulatory Visit: Admit: 2022-01-31 | Discharge: 2022-02-01 | Payer: PRIVATE HEALTH INSURANCE

## 2022-01-31 ENCOUNTER — Ambulatory Visit: Admit: 2022-01-31 | Discharge: 2022-02-01 | Payer: PRIVATE HEALTH INSURANCE | Attending: Urology | Primary: Urology

## 2022-01-31 DIAGNOSIS — Z6829 Body mass index (BMI) 29.0-29.9, adult: Secondary | ICD-10-CM | POA: Diagnosis not present

## 2022-01-31 DIAGNOSIS — Z09 Encounter for follow-up examination after completed treatment for conditions other than malignant neoplasm: Secondary | ICD-10-CM | POA: Diagnosis not present

## 2022-01-31 DIAGNOSIS — G540 Brachial plexus disorders: Secondary | ICD-10-CM | POA: Diagnosis not present

## 2022-01-31 DIAGNOSIS — M6289 Other specified disorders of muscle: Secondary | ICD-10-CM | POA: Diagnosis not present

## 2022-02-01 DIAGNOSIS — N898 Other specified noninflammatory disorders of vagina: Secondary | ICD-10-CM | POA: Diagnosis not present

## 2022-02-01 DIAGNOSIS — N390 Urinary tract infection, site not specified: Secondary | ICD-10-CM | POA: Diagnosis not present

## 2022-02-01 DIAGNOSIS — R300 Dysuria: Secondary | ICD-10-CM | POA: Diagnosis not present

## 2022-02-05 ENCOUNTER — Ambulatory Visit: Admit: 2022-02-05 | Discharge: 2022-02-05 | Payer: PRIVATE HEALTH INSURANCE

## 2022-02-05 DIAGNOSIS — M47816 Spondylosis without myelopathy or radiculopathy, lumbar region: Secondary | ICD-10-CM | POA: Diagnosis not present

## 2022-02-05 DIAGNOSIS — M5416 Radiculopathy, lumbar region: Secondary | ICD-10-CM | POA: Diagnosis not present

## 2022-02-05 DIAGNOSIS — M5116 Intervertebral disc disorders with radiculopathy, lumbar region: Secondary | ICD-10-CM | POA: Diagnosis not present

## 2022-02-05 DIAGNOSIS — M4726 Other spondylosis with radiculopathy, lumbar region: Secondary | ICD-10-CM | POA: Diagnosis not present

## 2022-02-06 ENCOUNTER — Other Ambulatory Visit: Payer: Self-pay | Admitting: Obstetrics & Gynecology

## 2022-02-08 ENCOUNTER — Other Ambulatory Visit: Payer: Self-pay | Admitting: *Deleted

## 2022-02-08 MED ORDER — NITROFURANTOIN MONOHYD MACRO 100 MG PO CAPS
ORAL_CAPSULE | ORAL | 2 refills | Status: DC
Start: 2022-02-08 — End: 2022-08-07

## 2022-02-13 ENCOUNTER — Ambulatory Visit: Admit: 2022-02-13 | Discharge: 2022-02-14 | Payer: MEDICARE

## 2022-02-13 DIAGNOSIS — M5416 Radiculopathy, lumbar region: Secondary | ICD-10-CM | POA: Diagnosis not present

## 2022-02-13 DIAGNOSIS — M5126 Other intervertebral disc displacement, lumbar region: Secondary | ICD-10-CM | POA: Diagnosis not present

## 2022-02-13 DIAGNOSIS — Z6829 Body mass index (BMI) 29.0-29.9, adult: Secondary | ICD-10-CM | POA: Diagnosis not present

## 2022-02-28 ENCOUNTER — Ambulatory Visit: Admit: 2022-02-28 | Discharge: 2022-03-01 | Payer: BLUE CROSS/BLUE SHIELD | Attending: Specialist | Primary: Specialist

## 2022-02-28 ENCOUNTER — Ambulatory Visit: Admit: 2022-02-28 | Discharge: 2022-03-01 | Payer: BLUE CROSS/BLUE SHIELD

## 2022-02-28 DIAGNOSIS — Z6828 Body mass index (BMI) 28.0-28.9, adult: Secondary | ICD-10-CM | POA: Diagnosis not present

## 2022-02-28 DIAGNOSIS — R9389 Abnormal findings on diagnostic imaging of other specified body structures: Secondary | ICD-10-CM | POA: Diagnosis not present

## 2022-02-28 DIAGNOSIS — G540 Brachial plexus disorders: Secondary | ICD-10-CM | POA: Diagnosis not present

## 2022-03-09 ENCOUNTER — Emergency Department (HOSPITAL_COMMUNITY)
Admission: EM | Admit: 2022-03-09 | Discharge: 2022-03-09 | Disposition: A | Discharge status: Home / Self Care | Payer: Medicare Other | Attending: Emergency Medicine | Admitting: Emergency Medicine

## 2022-03-09 ENCOUNTER — Encounter (HOSPITAL_COMMUNITY): Payer: Self-pay | Admitting: *Deleted

## 2022-03-09 ENCOUNTER — Other Ambulatory Visit: Payer: Self-pay

## 2022-03-09 DIAGNOSIS — D72829 Elevated white blood cell count, unspecified: Secondary | ICD-10-CM | POA: Diagnosis not present

## 2022-03-09 DIAGNOSIS — Z91040 Latex allergy status: Secondary | ICD-10-CM | POA: Insufficient documentation

## 2022-03-09 DIAGNOSIS — R112 Nausea with vomiting, unspecified: Secondary | ICD-10-CM | POA: Diagnosis not present

## 2022-03-09 DIAGNOSIS — L509 Urticaria, unspecified: Secondary | ICD-10-CM | POA: Insufficient documentation

## 2022-03-09 DIAGNOSIS — R197 Diarrhea, unspecified: Secondary | ICD-10-CM | POA: Diagnosis not present

## 2022-03-09 DIAGNOSIS — E860 Dehydration: Secondary | ICD-10-CM | POA: Diagnosis not present

## 2022-03-09 DIAGNOSIS — R1084 Generalized abdominal pain: Secondary | ICD-10-CM | POA: Diagnosis not present

## 2022-03-09 DIAGNOSIS — R891 Abnormal level of hormones in specimens from other organs, systems and tissues: Secondary | ICD-10-CM | POA: Diagnosis not present

## 2022-03-09 DIAGNOSIS — R000 Tachycardia, unspecified: Secondary | ICD-10-CM | POA: Insufficient documentation

## 2022-03-09 HISTORY — DX: Mast cell activation, unspecified: D89.40

## 2022-03-09 HISTORY — DX: Postural orthostatic tachycardia syndrome (POTS): G90.A

## 2022-03-09 HISTORY — DX: Ehlers-Danlos syndrome, unspecified: Q79.60

## 2022-03-09 LAB — CBC WITH DIFFERENTIAL/PLATELET
Abs Immature Granulocytes: 0.03 10*3/uL (ref 0.00–0.07)
Basophils Absolute: 0 10*3/uL (ref 0.0–0.1)
Basophils Relative: 0 %
Eosinophils Absolute: 0 10*3/uL (ref 0.0–0.5)
Eosinophils Relative: 0 %
HCT: 40.7 % (ref 36.0–46.0)
Hemoglobin: 13.3 g/dL (ref 12.0–15.0)
Immature Granulocytes: 0 %
Lymphocytes Relative: 7 %
Lymphs Abs: 0.8 10*3/uL (ref 0.7–4.0)
MCH: 27.7 pg (ref 26.0–34.0)
MCHC: 32.7 g/dL (ref 30.0–36.0)
MCV: 84.6 fL (ref 80.0–100.0)
Monocytes Absolute: 0.9 10*3/uL (ref 0.1–1.0)
Monocytes Relative: 8 %
Neutro Abs: 9.3 10*3/uL — ABNORMAL HIGH (ref 1.7–7.7)
Neutrophils Relative %: 85 %
Platelets: 235 10*3/uL (ref 150–400)
RBC: 4.81 MIL/uL (ref 3.87–5.11)
RDW: 13.1 % (ref 11.5–15.5)
WBC: 11 10*3/uL — ABNORMAL HIGH (ref 4.0–10.5)
nRBC: 0 % (ref 0.0–0.2)

## 2022-03-09 LAB — COMPREHENSIVE METABOLIC PANEL
ALT: 15 U/L (ref 0–44)
AST: 10 U/L — ABNORMAL LOW (ref 15–41)
Albumin: 4.1 g/dL (ref 3.5–5.0)
Alkaline Phosphatase: 70 U/L (ref 38–126)
Anion gap: 10 (ref 5–15)
BUN: 14 mg/dL (ref 6–20)
CO2: 23 mmol/L (ref 22–32)
Calcium: 8.7 mg/dL — ABNORMAL LOW (ref 8.9–10.3)
Chloride: 104 mmol/L (ref 98–111)
Creatinine, Ser: 0.72 mg/dL (ref 0.44–1.00)
GFR, Estimated: >60 mL/min (ref >60)
Glucose, Bld: 109 mg/dL — ABNORMAL HIGH (ref 70–99)
Potassium: 3.8 mmol/L (ref 3.5–5.1)
Sodium: 137 mmol/L (ref 135–145)
Total Bilirubin: 0.8 mg/dL (ref 0.3–1.2)
Total Protein: 7.5 g/dL (ref 6.5–8.1)

## 2022-03-09 LAB — URINALYSIS, ROUTINE W REFLEX MICROSCOPIC
Bilirubin Urine: NEGATIVE
Glucose, UA: NEGATIVE mg/dL
Hgb urine dipstick: NEGATIVE
Ketones, ur: 20 mg/dL — AB
Leukocytes,Ua: NEGATIVE
Nitrite: NEGATIVE
Protein, ur: NEGATIVE mg/dL
Specific Gravity, Urine: 1.023 (ref 1.005–1.030)
pH: 7 (ref 5.0–8.0)

## 2022-03-09 LAB — I-STAT BETA HCG BLOOD, ED (MC, WL, AP ONLY): I-stat hCG, quantitative: 6 m[IU]/mL — ABNORMAL HIGH (ref <5)

## 2022-03-09 MED ORDER — DIPHENHYDRAMINE HCL 50 MG/ML IJ SOLN
25.0000 mg | Freq: Once | INTRAMUSCULAR | Status: AC
  Administered 2022-03-09: 25 mg via INTRAVENOUS
  Filled 2022-03-09: qty 1

## 2022-03-09 MED ORDER — SODIUM CHLORIDE 0.9 % IV BOLUS
1000.0000 mL | Freq: Once | INTRAVENOUS | Status: AC
  Administered 2022-03-09: 1000 mL via INTRAVENOUS

## 2022-03-09 MED ORDER — PROMETHAZINE HCL 25 MG RE SUPP
25.0000 mg | Freq: Once | RECTAL | Status: AC
  Administered 2022-03-09: 25 mg via RECTAL
  Filled 2022-03-09: qty 1

## 2022-03-09 MED ORDER — HYDROCORTISONE SOD SUC (PF) 100 MG IJ SOLR
100.0000 mg | Freq: Once | INTRAMUSCULAR | Status: AC
  Administered 2022-03-09: 100 mg via INTRAVENOUS
  Filled 2022-03-09: qty 2

## 2022-03-09 MED ORDER — KETOROLAC TROMETHAMINE 30 MG/ML IJ SOLN
30.0000 mg | Freq: Once | INTRAMUSCULAR | Status: AC
  Administered 2022-03-09: 30 mg via INTRAVENOUS
  Filled 2022-03-09: qty 1

## 2022-03-09 NOTE — ED Triage Notes (Signed)
Pt with emesis x 2 days, generalized weakness.  Not able to keep her medications down.  Diarrhea x 1.   Kids at home have been sick with same symptoms.  ?

## 2022-03-09 NOTE — ED Notes (Signed)
Pt states she have POTS ?

## 2022-03-09 NOTE — Discharge Instructions (Signed)
Keep your follow-up with your dermatologist this week.  Also call and schedule a follow-up with your PCP within the next 2 to 3 days for reevaluation and continued medical management. ? ?Return to the ED for new or worsening symptoms as discussed. ?

## 2022-03-09 NOTE — ED Provider Notes (Signed)
?Sharon Rowland ?Provider Note ? ? ?CSN: [id number] ?Arrival date & time: 03/09/22  1614 ? ?  ? ?History ? ?Chief Complaint  ?Patient presents with  ? Emesis  ? ? ?Sharon Rowland is a 36 y.o. female with a complex history of SLE, ehlers-danlos syndrome, and mast cell activation syndrome presenting today with two days of nausea, vomiting, and diarrhea.  States her kids had the same symptoms a few days prior.  Unable to keep down adequate fluids/food.  Endorses mild generalized abdominal pain.  Began feeling lightheaded and dizzy a few hours ago, and decided to come to the ED.  Hasn't been able to take her medications, including her chronic steroids.  Requesting treatment for dehydration.  Denies fever, shortness of breath, or chest pain.  Denies headache or vision changes.  Denies urinary symptoms.  Also complaining of a mast cell activation flare-up. ? ?The history is provided by the patient and medical records.  ?Emesis ?Associated symptoms: abdominal pain (Mild) and diarrhea   ? ?  ? ?Home Medications ?Prior to Admission medications   ?Medication Sig Start Date End Date Taking? Authorizing Provider  ?albuterol (PROVENTIL HFA;VENTOLIN HFA) 108 (90 Base) MCG/ACT inhaler Inhale 2 puffs into the lungs every 6 (six) hours as needed for wheezing or shortness of breath.     [provider]  ?Calcium-Vitamin D-Vitamin K (VIACTIV CALCIUM PLUS D) 650-12.5-40 MG-MCG-MCG CHEW Chew 1 each by mouth daily.    [provider]  ?desonide (DESOWEN) 0.05 % ointment Apply 1 application topically 2 (two) times daily. 04/24/20   [provider]  ?fludrocortisone (FLORINEF) 0.1 MG tablet Take 100 mcg by mouth daily. 11/25/19   [provider]  ?folic acid (FOLVITE) 1 MG tablet Take 1 mg by mouth daily. 03/09/20   [provider]  ?ibuprofen (ADVIL) 200 MG tablet Take 800 mg by mouth every 6 (six) hours as needed for mild pain or moderate pain.    [provider]   ?levothyroxine (SYNTHROID) 75 MCG tablet Take 75 mcg by mouth daily before breakfast.     [provider]  ?Liraglutide -Weight Management (SAXENDA) 18 MG/3ML SOPN Inject 0.6 mLs into the skin daily. 10/12/20   [provider]  ?methocarbamol (ROBAXIN) 500 MG tablet Take 500 mg by mouth 2 (two) times daily. 11/04/20   [provider]  ?metoCLOPramide (REGLAN) 5 MG tablet Take 5 mg by mouth 2 (two) times daily. 04/12/20   [provider]  ?Multiple Vitamin (MULTIVITAMIN WITH MINERALS) TABS tablet Take 1 tablet by mouth daily.    [provider]  ?nitrofurantoin, macrocrystal-monohydrate, (MACROBID) 100 MG capsule Take 1 capsule by mouth once a day only after intercourse to prevent urinary tract infection. Maximum daily dose of 1 capsule. 02/08/22   Lazaro Arms, MD  ?OnabotulinumtoxinA (BOTOX IJ) Inject 1 Dose as directed every 30 (thirty) days.     [provider]  ?phentermine (ADIPEX-P) 37.5 MG tablet Take 37.5 mg by mouth daily before breakfast.  09/22/20   [provider]  ?pregabalin (LYRICA) 25 MG capsule  11/08/20   [provider]  ?pregabalin (LYRICA) 50 MG capsule Take 50 mg by mouth 2 (two) times daily. 10/16/20   [provider]  ?Rimegepant Sulfate 75 MG TBDP Take 1 tablet by mouth daily as needed.  04/12/20   [provider]  ?UBRELVY 100 MG TABS Take by mouth. ?Patient not taking: Reported on 11/12/2020 10/25/20   [provider]  ?   ? ?  Allergies    ?Plaquenil [hydroxychloroquine], Latex, Tape, and Zofran [ondansetron hcl]   ? ?Review of Systems   ?Review of Systems  ?Gastrointestinal:  Positive for abdominal pain (Mild), diarrhea, nausea and vomiting.  ?Skin:  Positive for rash.  ? ?Physical Exam ?Updated Vital Signs ?BP 116/76 (BP Location: Left Arm)   Pulse (!) 101   Temp 98.4 ?F (36.9 ?C) (Oral)   Resp 15   Ht 5\' 4"  (1.626 m)   Wt 78.9 kg   LMP 10/23/2016 (Approximate)   SpO2 98%   BMI 29.87  kg/m?  ?Physical Exam ?Vitals and nursing note reviewed.  ?Constitutional:   ?   General: She is not in acute distress. ?   Appearance: She is well-developed.  ?HENT:  ?   Head: Normocephalic and atraumatic.  ?   Mouth/Throat:  ?   Mouth: Mucous membranes are dry.  ?   Pharynx: Oropharynx is clear.  ?Eyes:  ?   Conjunctiva/sclera: Conjunctivae normal.  ?Cardiovascular:  ?   Rate and Rhythm: Regular rhythm. Tachycardia present.  ?   Pulses: Normal pulses.  ?   Heart sounds: No murmur heard. ?   Comments: HR appreciated at 104 ?Pulmonary:  ?   Effort: Pulmonary effort is normal. No respiratory distress.  ?   Breath sounds: Normal breath sounds.  ?Chest:  ?   Chest wall: No tenderness.  ?Abdominal:  ?   General: Bowel sounds are normal.  ?   Palpations: Abdomen is soft.  ?   Tenderness: There is abdominal tenderness (Mild, generalized).  ?Musculoskeletal:     ?   General: No swelling.  ?   Cervical back: Neck supple.  ?Skin: ?   General: Skin is warm and dry.  ?   Capillary Refill: Capillary refill takes less than 2 seconds.  ?   Findings: Rash (3-4 small distinct areas of urticaria on neck) present.  ?Neurological:  ?   General: No focal deficit present.  ?   Mental Status: She is alert and oriented to person, place, and time.  ?Psychiatric:     ?   Mood and Affect: Mood normal.  ? ? ?ED Results / Procedures / Treatments   ?Labs ?(all labs ordered are listed, but only abnormal results are displayed) ?Labs Reviewed  ?CBC WITH DIFFERENTIAL/PLATELET - Abnormal; Notable for the following components:  ?    Result Value  ? WBC 11.0 (*)   ? Neutro Abs 9.3 (*)   ? All other components within normal limits  ?COMPREHENSIVE METABOLIC PANEL - Abnormal; Notable for the following components:  ? Glucose, Bld 109 (*)   ? Calcium 8.7 (*)   ? AST 10 (*)   ? All other components within normal limits  ?URINALYSIS, ROUTINE W REFLEX MICROSCOPIC - Abnormal; Notable for the following components:  ? Ketones, ur 20 (*)   ? All other components  within normal limits  ?I-STAT BETA HCG BLOOD, ED ([facility], [facility], [facility] ONLY) - Abnormal; Notable for the following components:  ? I-stat hCG, quantitative 6.0 (*)   ? All other components within normal limits  ? ? ?EKG ?EKG Interpretation ? ?Date/Time:  Saturday March 09 2022 18:43:55 EDT ?Ventricular Rate:  95 ?PR Interval:  160 ?QRS Duration: 76 ?QT Interval:  336 ?QTC Calculation: 422 ?R Axis:   14 ?Text Interpretation: Normal sinus rhythm Minimal voltage criteria for LVH, may be normal variant ( R in aVL ) Cannot rule out Anterior infarct , age undetermined Abnormal ECG When compared  with ECG of 09-Mar-2022 16:38, T wave inversion no longer evident in Inferior leads T wave inversion no longer evident in Lateral leads ? ? ? ? ? ?  this EKG is not Ms. Dynver Cordwell, EKG this is an error Confirmed by Bethann Berkshire [phone]) on 03/09/2022 7:08:23 PM ? ?Radiology ?No results found. ? ?Procedures ?Procedures  ? ? ?Medications Ordered in ED ?Medications  ?sodium chloride 0.9 % bolus 1,000 mL (0 mLs Intravenous Stopped 03/09/22 1751)  ?promethazine (PHENERGAN) suppository 25 mg (25 mg Rectal Given 03/09/22 1800)  ?sodium chloride 0.9 % bolus 1,000 mL (0 mLs Intravenous Stopped 03/09/22 1959)  ?diphenhydrAMINE (BENADRYL) injection 25 mg (25 mg Intravenous Given 03/09/22 1758)  ?hydrocortisone sodium succinate (SOLU-CORTEF) 100 MG injection 100 mg (100 mg Intravenous Given 03/09/22 1756)  ?ketorolac (TORADOL) 30 MG/ML injection 30 mg (30 mg Intravenous Given 03/09/22 1817)  ?sodium chloride 0.9 % bolus 1,000 mL (0 mLs Intravenous Stopped 03/09/22 2125)  ? ? ?ED Course/ Medical Decision Making/ A&P ?Clinical Course as of 03/10/22 1553  ?Wynelle Link Mar 10, 2022  ?1513 Comment 3:        [AC]  ?  ?Clinical Course User Index ?[AC] Cecil Cobbs, PA-C  ? ?                        ?Medical Decision Making ?Amount and/or Complexity of Data Reviewed ?External Data Reviewed: notes. ?Labs: ordered. Decision-making details documented in ED  Course. ?ECG/medicine tests: ordered and independent interpretation performed. Decision-making details documented in ED Course. ? ?Risk ?OTC drugs. ?Prescription drug management. ? ? ?36 y.o. female presents to the ED for conc

## 2022-03-11 NOTE — ED Notes (Signed)
EKG that resulted on 03/09/22 at 1903 in chart is incorrect and IT working on removal.  ?

## 2022-03-18 DIAGNOSIS — N319 Neuromuscular dysfunction of bladder, unspecified: Secondary | ICD-10-CM | POA: Diagnosis not present

## 2022-03-18 DIAGNOSIS — R32 Unspecified urinary incontinence: Secondary | ICD-10-CM | POA: Diagnosis not present

## 2022-03-18 DIAGNOSIS — R338 Other retention of urine: Secondary | ICD-10-CM | POA: Diagnosis not present

## 2022-03-18 DIAGNOSIS — R350 Frequency of micturition: Secondary | ICD-10-CM | POA: Diagnosis not present

## 2022-03-20 ENCOUNTER — Ambulatory Visit: Admit: 2022-03-20 | Discharge: 2022-03-21 | Payer: PRIVATE HEALTH INSURANCE

## 2022-03-20 DIAGNOSIS — G44099 Other trigeminal autonomic cephalgias (TAC), not intractable: Secondary | ICD-10-CM | POA: Diagnosis not present

## 2022-03-20 DIAGNOSIS — Z01818 Encounter for other preprocedural examination: Secondary | ICD-10-CM | POA: Diagnosis not present

## 2022-03-20 DIAGNOSIS — G43119 Migraine with aura, intractable, without status migrainosus: Secondary | ICD-10-CM | POA: Diagnosis not present

## 2022-03-20 DIAGNOSIS — M5481 Occipital neuralgia: Secondary | ICD-10-CM | POA: Diagnosis not present

## 2022-03-20 DIAGNOSIS — Z6829 Body mass index (BMI) 29.0-29.9, adult: Secondary | ICD-10-CM | POA: Diagnosis not present

## 2022-03-20 DIAGNOSIS — G500 Trigeminal neuralgia: Secondary | ICD-10-CM | POA: Diagnosis not present

## 2022-03-20 DIAGNOSIS — G44221 Chronic tension-type headache, intractable: Secondary | ICD-10-CM | POA: Diagnosis not present

## 2022-03-21 DIAGNOSIS — G43119 Migraine with aura, intractable, without status migrainosus: Principal | ICD-10-CM

## 2022-03-23 HISTORY — PX: LIPOSUCTION: SHX10

## 2022-03-27 ENCOUNTER — Ambulatory Visit: Admit: 2022-03-27 | Discharge: 2022-03-28 | Payer: MEDICARE

## 2022-03-27 DIAGNOSIS — M542 Cervicalgia: Secondary | ICD-10-CM | POA: Diagnosis not present

## 2022-03-27 DIAGNOSIS — G540 Brachial plexus disorders: Secondary | ICD-10-CM | POA: Diagnosis not present

## 2022-03-27 DIAGNOSIS — R930 Abnormal findings on diagnostic imaging of skull and head, not elsewhere classified: Secondary | ICD-10-CM | POA: Diagnosis not present

## 2022-03-27 DIAGNOSIS — R9389 Abnormal findings on diagnostic imaging of other specified body structures: Secondary | ICD-10-CM | POA: Diagnosis not present

## 2022-03-27 DIAGNOSIS — M79603 Pain in arm, unspecified: Secondary | ICD-10-CM | POA: Diagnosis not present

## 2022-03-28 ENCOUNTER — Ambulatory Visit: Admit: 2022-03-28 | Discharge: 2022-03-29 | Payer: MEDICARE

## 2022-03-28 DIAGNOSIS — M25372 Other instability, left ankle: Secondary | ICD-10-CM | POA: Diagnosis not present

## 2022-03-28 DIAGNOSIS — R1084 Generalized abdominal pain: Secondary | ICD-10-CM | POA: Diagnosis not present

## 2022-03-28 DIAGNOSIS — M1990 Unspecified osteoarthritis, unspecified site: Secondary | ICD-10-CM | POA: Diagnosis not present

## 2022-03-28 DIAGNOSIS — R112 Nausea with vomiting, unspecified: Secondary | ICD-10-CM | POA: Diagnosis not present

## 2022-03-28 DIAGNOSIS — M329 Systemic lupus erythematosus, unspecified: Secondary | ICD-10-CM | POA: Diagnosis not present

## 2022-03-28 DIAGNOSIS — G894 Chronic pain syndrome: Secondary | ICD-10-CM | POA: Diagnosis not present

## 2022-03-28 DIAGNOSIS — R9431 Abnormal electrocardiogram [ECG] [EKG]: Secondary | ICD-10-CM | POA: Diagnosis not present

## 2022-03-28 DIAGNOSIS — Z792 Long term (current) use of antibiotics: Secondary | ICD-10-CM | POA: Diagnosis not present

## 2022-03-28 DIAGNOSIS — E079 Disorder of thyroid, unspecified: Secondary | ICD-10-CM | POA: Diagnosis not present

## 2022-03-28 DIAGNOSIS — E7800 Pure hypercholesterolemia, unspecified: Secondary | ICD-10-CM | POA: Diagnosis not present

## 2022-03-28 DIAGNOSIS — Q796 Ehlers-Danlos syndrome, unspecified: Secondary | ICD-10-CM | POA: Diagnosis not present

## 2022-03-28 DIAGNOSIS — Z79890 Hormone replacement therapy (postmenopausal): Secondary | ICD-10-CM | POA: Diagnosis not present

## 2022-03-28 DIAGNOSIS — N3000 Acute cystitis without hematuria: Secondary | ICD-10-CM | POA: Diagnosis not present

## 2022-03-28 DIAGNOSIS — I7300 Raynaud's syndrome without gangrene: Secondary | ICD-10-CM | POA: Diagnosis not present

## 2022-03-28 DIAGNOSIS — R109 Unspecified abdominal pain: Secondary | ICD-10-CM | POA: Diagnosis not present

## 2022-03-28 DIAGNOSIS — Z7985 Long-term (current) use of injectable non-insulin antidiabetic drugs: Secondary | ICD-10-CM | POA: Diagnosis not present

## 2022-03-28 DIAGNOSIS — G540 Brachial plexus disorders: Secondary | ICD-10-CM | POA: Diagnosis not present

## 2022-03-28 DIAGNOSIS — G901 Familial dysautonomia [Riley-Day]: Secondary | ICD-10-CM | POA: Diagnosis not present

## 2022-03-29 ENCOUNTER — Ambulatory Visit: Payer: MEDICARE

## 2022-03-29 DIAGNOSIS — R1084 Generalized abdominal pain: Secondary | ICD-10-CM | POA: Diagnosis not present

## 2022-03-29 MED ORDER — SULFAMETHOXAZOLE 800 MG-TRIMETHOPRIM 160 MG TABLET
ORAL_TABLET | Freq: Two times a day (BID) | ORAL | 0 refills | 3 days | Status: CP
Start: 2022-03-29 — End: 2022-04-01

## 2022-03-30 DIAGNOSIS — K529 Noninfective gastroenteritis and colitis, unspecified: Secondary | ICD-10-CM | POA: Diagnosis not present

## 2022-03-30 DIAGNOSIS — N3000 Acute cystitis without hematuria: Secondary | ICD-10-CM | POA: Diagnosis not present

## 2022-03-30 DIAGNOSIS — R112 Nausea with vomiting, unspecified: Secondary | ICD-10-CM | POA: Diagnosis not present

## 2022-03-30 DIAGNOSIS — R110 Nausea: Secondary | ICD-10-CM | POA: Diagnosis not present

## 2022-03-30 DIAGNOSIS — E785 Hyperlipidemia, unspecified: Secondary | ICD-10-CM | POA: Diagnosis not present

## 2022-03-30 DIAGNOSIS — E860 Dehydration: Secondary | ICD-10-CM | POA: Diagnosis not present

## 2022-03-30 DIAGNOSIS — R000 Tachycardia, unspecified: Secondary | ICD-10-CM | POA: Diagnosis not present

## 2022-03-30 DIAGNOSIS — Q796 Ehlers-Danlos syndrome, unspecified: Secondary | ICD-10-CM | POA: Diagnosis not present

## 2022-03-30 DIAGNOSIS — M329 Systemic lupus erythematosus, unspecified: Secondary | ICD-10-CM | POA: Diagnosis not present

## 2022-03-30 DIAGNOSIS — R1084 Generalized abdominal pain: Secondary | ICD-10-CM | POA: Diagnosis not present

## 2022-03-30 DIAGNOSIS — Z6829 Body mass index (BMI) 29.0-29.9, adult: Secondary | ICD-10-CM | POA: Diagnosis not present

## 2022-03-30 DIAGNOSIS — R300 Dysuria: Secondary | ICD-10-CM | POA: Diagnosis not present

## 2022-03-30 MED ORDER — SULFAMETHOXAZOLE 800 MG-TRIMETHOPRIM 160 MG TABLET
ORAL_TABLET | Freq: Two times a day (BID) | ORAL | 0 refills | 3 days | Status: CP
Start: 2022-03-30 — End: 2022-04-02

## 2022-03-31 ENCOUNTER — Ambulatory Visit: Admit: 2022-03-30 | Discharge: 2022-03-31 | Payer: MEDICARE | Attending: Family Medicine | Primary: Family Medicine

## 2022-03-31 ENCOUNTER — Emergency Department: Admit: 2022-03-31 | Discharge: 2022-03-31 | Disposition: A | Discharge status: Home / Self Care | Payer: MEDICARE

## 2022-03-31 ENCOUNTER — Ambulatory Visit: Payer: MEDICARE

## 2022-03-31 MED ORDER — PROMETHAZINE 25 MG TABLET
ORAL_TABLET | Freq: Four times a day (QID) | ORAL | 0 refills | 8 days | Status: CP | PRN
Start: 2022-03-31 — End: 2022-04-07

## 2022-04-11 ENCOUNTER — Ambulatory Visit: Admit: 2022-04-11 | Discharge: 2022-04-11 | Payer: MEDICARE

## 2022-04-11 ENCOUNTER — Ambulatory Visit: Admit: 2022-04-11 | Discharge: 2022-04-11 | Payer: MEDICARE | Attending: Specialist | Primary: Specialist

## 2022-04-11 DIAGNOSIS — S93432A Sprain of tibiofibular ligament of left ankle, initial encounter: Secondary | ICD-10-CM | POA: Diagnosis not present

## 2022-04-11 DIAGNOSIS — M25372 Other instability, left ankle: Secondary | ICD-10-CM | POA: Diagnosis not present

## 2022-04-11 DIAGNOSIS — X58XXXD Exposure to other specified factors, subsequent encounter: Secondary | ICD-10-CM | POA: Diagnosis not present

## 2022-04-11 DIAGNOSIS — S93432D Sprain of tibiofibular ligament of left ankle, subsequent encounter: Secondary | ICD-10-CM | POA: Diagnosis not present

## 2022-04-11 DIAGNOSIS — R600 Localized edema: Secondary | ICD-10-CM | POA: Diagnosis not present

## 2022-04-11 DIAGNOSIS — Q796 Ehlers-Danlos syndrome, unspecified: Secondary | ICD-10-CM | POA: Diagnosis not present

## 2022-04-11 DIAGNOSIS — G540 Brachial plexus disorders: Secondary | ICD-10-CM | POA: Diagnosis not present

## 2022-04-11 DIAGNOSIS — Z6830 Body mass index (BMI) 30.0-30.9, adult: Secondary | ICD-10-CM | POA: Diagnosis not present

## 2022-04-12 DIAGNOSIS — M76891 Other specified enthesopathies of right lower limb, excluding foot: Secondary | ICD-10-CM | POA: Diagnosis not present

## 2022-04-12 DIAGNOSIS — M25851 Other specified joint disorders, right hip: Secondary | ICD-10-CM | POA: Diagnosis not present

## 2022-04-12 DIAGNOSIS — M25859 Other specified joint disorders, unspecified hip: Secondary | ICD-10-CM | POA: Diagnosis not present

## 2022-04-15 ENCOUNTER — Ambulatory Visit: Admit: 2022-04-15 | Discharge: 2022-04-16 | Payer: MEDICARE | Attending: Psychologist | Primary: Psychologist

## 2022-04-15 DIAGNOSIS — F331 Major depressive disorder, recurrent, moderate: Secondary | ICD-10-CM | POA: Diagnosis not present

## 2022-04-15 DIAGNOSIS — Q796 Ehlers-Danlos syndrome, unspecified: Secondary | ICD-10-CM | POA: Diagnosis not present

## 2022-04-15 DIAGNOSIS — F419 Anxiety disorder, unspecified: Secondary | ICD-10-CM | POA: Diagnosis not present

## 2022-04-15 DIAGNOSIS — Z87828 Personal history of other (healed) physical injury and trauma: Secondary | ICD-10-CM | POA: Diagnosis not present

## 2022-04-17 DIAGNOSIS — M25372 Other instability, left ankle: Principal | ICD-10-CM

## 2022-04-17 DIAGNOSIS — Q796 Ehlers-Danlos syndrome, unspecified: Principal | ICD-10-CM

## 2022-04-17 DIAGNOSIS — G90A POTS (postural orthostatic tachycardia syndrome): Principal | ICD-10-CM

## 2022-04-22 MED ORDER — DOCUSATE SODIUM 100 MG CAPSULE
ORAL_CAPSULE | Freq: Two times a day (BID) | ORAL | 0 refills | 10 days
Start: 2022-04-22 — End: 2022-05-02

## 2022-04-22 MED ORDER — HYDROCODONE 5 MG-ACETAMINOPHEN 325 MG TABLET
ORAL_TABLET | Freq: Four times a day (QID) | ORAL | 0 refills | 4 days | PRN
Start: 2022-04-22 — End: ?

## 2022-04-22 MED ORDER — DICLOFENAC SODIUM 50 MG TABLET,DELAYED RELEASE
ORAL_TABLET | Freq: Every day | ORAL | 0 refills | 14 days
Start: 2022-04-22 — End: 2022-05-06

## 2022-04-22 MED ORDER — ONDANSETRON 4 MG DISINTEGRATING TABLET
ORAL_TABLET | Freq: Three times a day (TID) | ORAL | 0 refills | 7 days | PRN
Start: 2022-04-22 — End: ?

## 2022-04-22 MED ORDER — GABAPENTIN 100 MG CAPSULE
ORAL_CAPSULE | Freq: Every day | ORAL | 0 refills | 14 days
Start: 2022-04-22 — End: 2022-05-06

## 2022-04-22 MED ORDER — ERGOCALCIFEROL (VITAMIN D2) 1,250 MCG (50,000 UNIT) CAPSULE
ORAL_CAPSULE | ORAL | 0 refills | 56 days
Start: 2022-04-22 — End: 2022-06-21

## 2022-04-22 MED ORDER — ASPIRIN 325 MG TABLET
ORAL_TABLET | Freq: Every day | ORAL | 0 refills | 14 days
Start: 2022-04-22 — End: 2022-05-06

## 2022-04-26 DIAGNOSIS — E039 Hypothyroidism, unspecified: Secondary | ICD-10-CM | POA: Diagnosis not present

## 2022-04-29 ENCOUNTER — Ambulatory Visit: Admit: 2022-04-29 | Discharge: 2022-04-30 | Payer: MEDICARE

## 2022-04-29 DIAGNOSIS — L4050 Arthropathic psoriasis, unspecified: Secondary | ICD-10-CM | POA: Diagnosis not present

## 2022-04-29 DIAGNOSIS — G901 Familial dysautonomia [Riley-Day]: Secondary | ICD-10-CM | POA: Diagnosis not present

## 2022-04-29 DIAGNOSIS — G540 Brachial plexus disorders: Secondary | ICD-10-CM | POA: Diagnosis not present

## 2022-04-29 DIAGNOSIS — M25372 Other instability, left ankle: Secondary | ICD-10-CM | POA: Diagnosis not present

## 2022-04-30 DIAGNOSIS — G540 Brachial plexus disorders: Principal | ICD-10-CM

## 2022-05-01 ENCOUNTER — Ambulatory Visit: Admit: 2022-05-01 | Discharge: 2022-05-01 | Payer: MEDICARE

## 2022-05-01 ENCOUNTER — Ambulatory Visit: Admit: 2022-05-01 | Discharge: 2022-05-01 | Payer: MEDICARE | Attending: Anesthesiology | Primary: Anesthesiology

## 2022-05-09 DIAGNOSIS — J4520 Mild intermittent asthma, uncomplicated: Secondary | ICD-10-CM | POA: Diagnosis not present

## 2022-05-09 DIAGNOSIS — I471 Supraventricular tachycardia: Secondary | ICD-10-CM | POA: Diagnosis not present

## 2022-05-09 DIAGNOSIS — R399 Unspecified symptoms and signs involving the genitourinary system: Secondary | ICD-10-CM | POA: Diagnosis not present

## 2022-05-09 DIAGNOSIS — Z09 Encounter for follow-up examination after completed treatment for conditions other than malignant neoplasm: Secondary | ICD-10-CM | POA: Diagnosis not present

## 2022-05-09 DIAGNOSIS — E7800 Pure hypercholesterolemia, unspecified: Secondary | ICD-10-CM | POA: Diagnosis not present

## 2022-05-10 DIAGNOSIS — G894 Chronic pain syndrome: Principal | ICD-10-CM

## 2022-05-17 ENCOUNTER — Ambulatory Visit: Admit: 2022-05-17 | Discharge: 2022-05-18 | Payer: MEDICARE

## 2022-05-17 DIAGNOSIS — G43119 Migraine with aura, intractable, without status migrainosus: Secondary | ICD-10-CM | POA: Diagnosis not present

## 2022-05-17 DIAGNOSIS — R1310 Dysphagia, unspecified: Secondary | ICD-10-CM | POA: Diagnosis not present

## 2022-05-24 ENCOUNTER — Ambulatory Visit (INDEPENDENT_AMBULATORY_CARE_PROVIDER_SITE_OTHER): Payer: Medicare Other | Admitting: Plastic Surgery

## 2022-05-24 VITALS — BP 121/83 | HR 84 | Temp 98.3°F | Resp 16 | Ht 64.0 in | Wt 173.0 lb

## 2022-05-24 DIAGNOSIS — M542 Cervicalgia: Secondary | ICD-10-CM

## 2022-05-24 DIAGNOSIS — M545 Low back pain, unspecified: Secondary | ICD-10-CM | POA: Diagnosis not present

## 2022-05-24 DIAGNOSIS — M546 Pain in thoracic spine: Secondary | ICD-10-CM

## 2022-05-24 DIAGNOSIS — Q796 Ehlers-Danlos syndrome, unspecified: Secondary | ICD-10-CM

## 2022-05-24 DIAGNOSIS — N62 Hypertrophy of breast: Secondary | ICD-10-CM | POA: Diagnosis not present

## 2022-05-24 DIAGNOSIS — G8929 Other chronic pain: Secondary | ICD-10-CM

## 2022-05-24 DIAGNOSIS — Z6829 Body mass index (BMI) 29.0-29.9, adult: Secondary | ICD-10-CM

## 2022-05-25 NOTE — Progress Notes (Signed)
Referring Provider [facility] [address] [place],  [place] [address]   CC:  Hypertrophy and back pain   MIAA Sharon Rowland is an 36 y.o. female.  HPI:   The patient is a 36 y.o. female with a history of mammary hyperplasia for several years.  She has a significant past medical history for Ehlers-Danlos syndrome.  She also needs surgery for thoracic outlet syndrome and her surgeon has recommended that she have breast reduction first.  She has extremely large breasts causing symptoms that include the following: Back pain in the upper and lower back, including neck pain. She pulls or pins her bra straps to provide better lift and relief of the pressure and pain. She notices relief by holding her breast up manually.  Her shoulder straps cause grooves and pain and pressure that requires padding for relief. Pain medication is sometimes required with motrin and tylenol.  Activities that are hindered by enlarged breasts include: exercise and running.  She has tried supportive clothing as well as fitted bras without improvement.     Mammogram history: None due to age.  Family history of breast cancer: None.  Tobacco use: None none.   The patient expresses the desire to pursue surgical intervention.  She has completed visits with chiropractor and physical therapy.  The BMI = 29.  Preoperative bra size = double D cup.   She is also interested in abdominoplasty.  She has had 6 pregnancies.  Allergies  Allergen Reactions   Plaquenil [Hydroxychloroquine] Other (See Comments)    Prolonged QT   Latex Dermatitis   Tape     Use paper tape   Zofran [Ondansetron Hcl]     Prolonged QT    Outpatient Encounter Medications as of 05/24/2022  Medication Sig Note   albuterol (PROVENTIL HFA;VENTOLIN HFA) 108 (90 Base) MCG/ACT inhaler Inhale 2 puffs into the lungs every 6 (six) hours as needed for wheezing or shortness of breath.     buPROPion (WELLBUTRIN XL) 300 MG 24 hr tablet Take 300 mg by  mouth daily.    Calcium-Vitamin D-Vitamin K (VIACTIV CALCIUM PLUS D) 650-12.5-40 MG-MCG-MCG CHEW Chew 1 each by mouth daily.    celecoxib (CELEBREX) 100 MG capsule Take 100 mg by mouth 2 (two) times daily.    clobetasol cream (TEMOVATE) 0.05 % clobetasol 0.05 % topical cream    cromolyn (GASTROCROM) 100 MG/5ML solution cromolyn 100 mg/5 mL oral concentrate 05/24/2022: Pt uses topically   desonide (DESOWEN) 0.05 % ointment Apply 1 application topically 2 (two) times daily.    ibuprofen (ADVIL) 200 MG tablet Take 800 mg by mouth every 6 (six) hours as needed for mild pain or moderate pain.    levothyroxine (SYNTHROID) 75 MCG tablet Take 75 mcg by mouth daily before breakfast.     lidocaine (LIDODERM) 5 % 2 patches daily.    methylphenidate 10 MG ER tablet Take 10 mg by mouth every morning.    metoCLOPramide (REGLAN) 5 MG tablet Take 5 mg by mouth 2 (two) times daily.    Multiple Vitamin (MULTIVITAMIN WITH MINERALS) TABS tablet Take 1 tablet by mouth daily.    nitrofurantoin, macrocrystal-monohydrate, (MACROBID) 100 MG capsule Take 1 capsule by mouth once a day only after intercourse to prevent urinary tract infection. Maximum daily dose of 1 capsule.    OnabotulinumtoxinA (BOTOX IJ) Inject 1 Dose as directed every 30 (thirty) days.     pregabalin (LYRICA) 75 MG capsule Take 75 mg by mouth 2 (two) times  daily.    promethazine (PHENERGAN) 25 MG suppository 12.5 mg as needed.    Rimegepant Sulfate 75 MG TBDP Take 1 tablet by mouth daily as needed.     [DISCONTINUED] pregabalin (LYRICA) 25 MG capsule     [DISCONTINUED] pregabalin (LYRICA) 50 MG capsule Take 50 mg by mouth 2 (two) times daily.    fludrocortisone (FLORINEF) 0.1 MG tablet Take 100 mcg by mouth daily.    folic acid (FOLVITE) 1 MG tablet Take 1 mg by mouth daily. (Patient not taking: Reported on 05/24/2022)    Liraglutide -Weight Management (SAXENDA) 18 MG/3ML SOPN Inject 0.6 mLs into the skin daily.    methocarbamol (ROBAXIN) 500 MG tablet  Take 500 mg by mouth 2 (two) times daily.    nitrofurantoin, macrocrystal-monohydrate, (MACROBID) 100 MG capsule Take 1 capsule by mouth once a day only after intercourse to prevent urinary tract infection. Maximum daily dose of 1 capsule.    phentermine (ADIPEX-P) 37.5 MG tablet Take 37.5 mg by mouth daily before breakfast.     UBRELVY 100 MG TABS Take by mouth. (Patient not taking: Reported on 11/12/2020)    No facility-administered encounter medications on file as of 05/24/2022.     Past Medical History:  Diagnosis Date   Allergic rhinitis    Anemia    Arthritis    Asthma    last used inhaler 1 month ago   Dysphagia    Dysrhythmia    SVT   Ehlers-Danlos syndrome    Female bladder prolapse    GERD (gastroesophageal reflux disease)    with pregnancy only   Lupus (HCC)    Mast cell activation syndrome (HCC)    Migraines    Pelvic pain in antepartum period in second trimester 01/17/2014   POTS (postural orthostatic tachycardia syndrome)    Pregnant 08/01/2015   PSVT (paroxysmal supraventricular tachycardia) (HCC)    could not be confirmed with 2016 study   Rectocele    Thyroid disease    Urethral prolapse    Uterine prolapse    Vaginal Pap smear, abnormal     Past Surgical History:  Procedure Laterality Date   ABDOMINAL HYSTERECTOMY     ANTERIOR AND POSTERIOR VAGINAL REPAIR     CYSTOCELE REPAIR N/A 09/17/2017   Procedure: ANTERIOR REPAIR (CYSTOCELE);  Surgeon: Lazaro Arms, MD;  Location: [facility];  Service: Gynecology;  Laterality: N/A;   SUPRAVENTRICULAR TACHYCARDIA ABLATION  01/05/2015   unsuccessful   SUPRAVENTRICULAR TACHYCARDIA ABLATION N/A 01/05/2015   Procedure: SUPRAVENTRICULAR TACHYCARDIA ABLATION;  Surgeon: Marinus Maw, MD;  Location: [facility] CATH LAB;  Service: Cardiovascular;  Laterality: N/A;   VAGINAL HYSTERECTOMY N/A 09/17/2017   Procedure: HYSTERECTOMY VAGINAL WITH POSSIBLE BILATERAL SALPINGECTOMY;  Surgeon: Lazaro Arms, MD;  Location: [facility];  Service:  Gynecology;  Laterality: N/A;    Family History  Problem Relation Age of Onset   Hypertension Mother    Hyperlipidemia Mother    Multiple sclerosis Mother    COPD Mother    Cancer Mother        melanoma x 2   Diabetes Mother    Heart disease Father        PSVT   Hyperlipidemia Brother    Hypertension Brother    Heart disease Maternal Grandfather        heart attack   Cancer Paternal Grandmother        pancreatic cancer   Heart disease Paternal Grandmother        MI  Hyperlipidemia Paternal Grandmother    Hypertension Paternal Grandmother    Asthma Daughter    Asthma Son    Autism Son    ADD / ADHD Son    Tourette syndrome Son    Other Son        Neurological and mental issues   ADD / ADHD Son     Social History   Social History Narrative   Lives with husband   Right handed    Caffeine use: 1-2 diet cokes per day     Review of Systems General: Denies fevers, chills, weight loss CV: Denies chest pain, shortness of breath, palpitations   Physical Exam    05/24/2022    2:37 PM 03/09/2022    9:15 PM 03/09/2022    7:30 PM  Vitals with BMI  Height 5\' 4"     Weight 173 lbs    BMI 29.68    Systolic 121 116  Diastolic 83 76 83  Pulse 84 101     General:  No acute distress,  Alert and oriented, Non-Toxic, Normal speech and affect Breast: No easily palpable breast masses on physical exam, significant breast ptosis and macromastia. Her breasts are extremely large and fairly symmetric with right possibly bigger 28.  She has hyperpigmentation of the inframammary area on both sides.  The sternal to nipple distance on the right is 28 cm and the left is 27 cm.  The IMF distance is 9 cm on the right and 10 cm on the left.  Base width is 16 bilaterally. Assessment/Plan   The patient has bilateral symptomatic macromastia.  She is a good candidate for a breast reduction.  She is interested in pursuing surgical treatment.  She has tried supportive garments and fitted bras  with no relief.  The details of breast reduction surgery were discussed.  I explained the procedure in detail along the with the expected scars.  The risks were discussed in detail and include bleeding, infection, damage to surrounding structures, need for additional procedures, nipple loss, change in nipple sensation, persistent pain, contour irregularities and asymmetries.  I explained that breast feeding is often not possible after breast reduction surgery.  We discussed the expected postoperative course with an overall recovery period of about 1 month.  She demonstrated full understanding of all risks.  We discussed her personal risk factors that include Ehlers-Danlos syndrome.  She is aware that she is significant increased risk for surgical wound complications.  The patient is interested in pursuing surgical treatment.  The estimated excess breast tissue to be removed at the time of surgery = 200 grams on the left and 200 grams on the right.  Patient is also interested in abdominoplasty.  She is a candidate for this but knows that due to her Ehlers-Danlos diagnosis she is at significant increased risk for wound complications.  She has healed previous wounds well before. 573 05/25/2022, 10:26 AM

## 2022-06-05 DIAGNOSIS — H40013 Open angle with borderline findings, low risk, bilateral: Secondary | ICD-10-CM | POA: Diagnosis not present

## 2022-06-05 DIAGNOSIS — H04123 Dry eye syndrome of bilateral lacrimal glands: Secondary | ICD-10-CM | POA: Diagnosis not present

## 2022-06-06 ENCOUNTER — Encounter: Payer: Self-pay | Admitting: *Deleted

## 2022-06-15 DIAGNOSIS — G43119 Migraine with aura, intractable, without status migrainosus: Principal | ICD-10-CM

## 2022-06-19 ENCOUNTER — Ambulatory Visit: Admit: 2022-06-19 | Discharge: 2022-06-20 | Payer: MEDICARE | Attending: Anesthesiology | Primary: Anesthesiology

## 2022-06-19 DIAGNOSIS — G500 Trigeminal neuralgia: Secondary | ICD-10-CM | POA: Diagnosis not present

## 2022-06-19 DIAGNOSIS — M5481 Occipital neuralgia: Secondary | ICD-10-CM | POA: Diagnosis not present

## 2022-06-19 DIAGNOSIS — G894 Chronic pain syndrome: Secondary | ICD-10-CM | POA: Diagnosis not present

## 2022-06-19 DIAGNOSIS — M5416 Radiculopathy, lumbar region: Secondary | ICD-10-CM | POA: Diagnosis not present

## 2022-06-19 DIAGNOSIS — M542 Cervicalgia: Secondary | ICD-10-CM | POA: Diagnosis not present

## 2022-06-19 MED ORDER — PREGABALIN 100 MG CAPSULE
ORAL_CAPSULE | Freq: Two times a day (BID) | ORAL | 1 refills | 90 days | Status: CP
Start: 2022-06-19 — End: 2022-12-16

## 2022-06-19 MED ORDER — CELECOXIB 200 MG CAPSULE
ORAL_CAPSULE | Freq: Every day | ORAL | 1 refills | 90 days | Status: CP
Start: 2022-06-19 — End: 2022-12-16

## 2022-07-04 ENCOUNTER — Telehealth: Payer: Self-pay | Admitting: *Deleted

## 2022-07-04 NOTE — Telephone Encounter (Signed)
Call placed to patient to update her that Medicare does not allow pre-authorization for breast reduction surgery so we cannot guarantee they will pay for the surgery in advance. Pt states she has already talked with them about the requirements for coverage of this surgery and feels she meets criteria (pt sent call transcript through [employer] and states she can fax supporting medical records to office). Advised her the first possible date is Aug 29 which is close to her reported ankle surgery scheduled for 9/12. Pt states she will reschedule the ankle surgery if she is able to get the breast reduction scheduled. Collie Siad who states she will call pt when she confirms the surgery date with [facility].

## 2022-07-04 NOTE — Telephone Encounter (Signed)
Call placed to pt to update on authorization process. Pt states she is interested in having the breast reduction surgery at this time. Pt states she has medicare and also has her medical records available for surgery authorization if needed and can fax them to the office. Fax number provided. Advised pt we would update her when more information is available.

## 2022-07-22 DIAGNOSIS — R1084 Generalized abdominal pain: Secondary | ICD-10-CM | POA: Diagnosis not present

## 2022-07-22 DIAGNOSIS — R933 Abnormal findings on diagnostic imaging of other parts of digestive tract: Secondary | ICD-10-CM | POA: Diagnosis not present

## 2022-07-22 DIAGNOSIS — R1013 Epigastric pain: Secondary | ICD-10-CM | POA: Diagnosis not present

## 2022-07-22 DIAGNOSIS — K625 Hemorrhage of anus and rectum: Secondary | ICD-10-CM | POA: Diagnosis not present

## 2022-07-23 DIAGNOSIS — M1611 Unilateral primary osteoarthritis, right hip: Secondary | ICD-10-CM | POA: Diagnosis not present

## 2022-07-23 DIAGNOSIS — M25551 Pain in right hip: Secondary | ICD-10-CM | POA: Diagnosis not present

## 2022-07-23 DIAGNOSIS — M76891 Other specified enthesopathies of right lower limb, excluding foot: Secondary | ICD-10-CM | POA: Diagnosis not present

## 2022-07-25 DIAGNOSIS — Z7960 Long term (current) use of unspecified immunomodulators and immunosuppressants: Secondary | ICD-10-CM | POA: Diagnosis not present

## 2022-07-25 DIAGNOSIS — M47819 Spondylosis without myelopathy or radiculopathy, site unspecified: Secondary | ICD-10-CM | POA: Diagnosis not present

## 2022-07-25 DIAGNOSIS — L4050 Arthropathic psoriasis, unspecified: Secondary | ICD-10-CM | POA: Diagnosis not present

## 2022-07-25 DIAGNOSIS — M359 Systemic involvement of connective tissue, unspecified: Secondary | ICD-10-CM | POA: Diagnosis not present

## 2022-07-29 ENCOUNTER — Ambulatory Visit (INDEPENDENT_AMBULATORY_CARE_PROVIDER_SITE_OTHER): Payer: Medicare Other | Admitting: Physician Assistant

## 2022-07-29 VITALS — BP 110/76 | HR 104 | Ht 64.0 in | Wt 178.4 lb

## 2022-07-29 DIAGNOSIS — D802 Selective deficiency of immunoglobulin A [IgA]: Secondary | ICD-10-CM | POA: Diagnosis not present

## 2022-07-29 DIAGNOSIS — N62 Hypertrophy of breast: Secondary | ICD-10-CM

## 2022-07-29 DIAGNOSIS — R198 Other specified symptoms and signs involving the digestive system and abdomen: Secondary | ICD-10-CM | POA: Diagnosis not present

## 2022-07-29 MED ORDER — OXYCODONE HCL 5 MG PO TABS: 5.0000 mg | ORAL_TABLET | Freq: Four times a day (QID) | ORAL | 0 refills | Status: AC | PRN

## 2022-07-29 NOTE — Progress Notes (Signed)
Patient ID: Sharon Rowland, female    DOB: September 20, 1986, 36 y.o.   MRN: [id number]  Chief Complaint  Patient presents with   Pre-op Exam      ICD-10-CM   1. Breast hypertrophy  N62        History of Present Illness: Sharon Rowland is a 36 y.o.  female  with a history of macromastia and Ehlers-Danlos syndrome.  She presents for preoperative evaluation for upcoming procedure, bilateral breast reduction, scheduled for 08/20/2022 with Dr.  Domenica Reamer .  The patient has not had problems with anesthesia.  She has had multiple previous surgeries without complication.  She is followed by Dr. Allena Katz at [facility] rheumatology for psoriatic arthritis and long-term use of venous breast medications.  She is holding her Cimzia until after surgery.  She is finishing a steroid taper for Bell's palsy flare and anticipates that she will be done in the next week.  She takes Celebrex chronically for her psoriatic arthritis, but will hold day prior to surgery.  She will also hold her multivitamins and supplements for 1 week prior to surgery.  She is no longer on phentermine or Saxenda.  She is on methylphenidate, but will hold day of surgery.  She confirms that she is a DD cup and would like to be a full C/small D cup.  She reports thoracic outlet syndrome and that this breast reduction surgery is a prerequisite for thoracic outlet syndrome surgery.  She has asthma, but mild and controlled.  She was seen by Dr. Tylene Fantasia cardiology at [facility] for her POTS.  She is also followed by neurology and rheumatology.  She has been experiencing rectal bleeding and symptoms of colitis and has colonoscopy planned with gastroenterology.  We will send for clearance from PCP.  Summary of Previous Visit: She was seen for initial consult on 05/24/2022.  No mammogram history due to age.  She complained of chronic upper back and neck discomfort in the context of large breasts.  Preoperative bra size equals DD cup.  STN 28 cm on the right, 27  cm on the left.  Base width 16 cm bilaterally.  Estimated excess breast tissue removed at time of surgery was 200 g each side.  BMI equals 29 kg/m.  She also reported history of 6 previous pregnancies and interested in abdominoplasty surgery, but understands that she is at increased risk for postoperative wounds due to EDS.  Given upcoming ankle and hip surgery, she decided against proceeding with abdominoplasty at this time.  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  PMH Significant for: Ehlers-Danlos syndrome, psoriatic arthritis, macromastia, POTS, multiparity, migraine disorder, thyroid disorder, obesity.   Past Medical History: Allergies: Allergies  Allergen Reactions   Plaquenil [Hydroxychloroquine] Other (See Comments)    Prolonged QT   Latex Dermatitis   Tape     Use paper tape   Zofran [Ondansetron Hcl]     Prolonged QT    Current Medications:  Current Outpatient Medications:    albuterol (PROVENTIL HFA;VENTOLIN HFA) 108 (90 Base) MCG/ACT inhaler, Inhale 2 puffs into the lungs every 6 (six) hours as needed for wheezing or shortness of breath. , Disp: , Rfl:    buPROPion (WELLBUTRIN XL) 300 MG 24 hr tablet, Take 300 mg by mouth daily., Disp: , Rfl:    Calcium-Vitamin D-Vitamin K (VIACTIV CALCIUM PLUS D) 650-12.5-40 MG-MCG-MCG CHEW, Chew 1 each by mouth daily., Disp: , Rfl:  celecoxib (CELEBREX) 100 MG capsule, Take 100 mg by mouth 2 (two) times daily., Disp: , Rfl:    clobetasol cream (TEMOVATE) 0.05 %, clobetasol 0.05 % topical cream, Disp: , Rfl:    cromolyn (GASTROCROM) 100 MG/5ML solution, cromolyn 100 mg/5 mL oral concentrate, Disp: , Rfl:    desonide (DESOWEN) 0.05 % ointment, Apply 1 application topically 2 (two) times daily., Disp: , Rfl:    folic acid (FOLVITE) 1 MG tablet, Take 1 mg by mouth daily., Disp: , Rfl:    ibuprofen (ADVIL) 200 MG tablet, Take 800 mg by mouth every 6 (six) hours as needed for mild pain or moderate  pain., Disp: , Rfl:    levothyroxine (SYNTHROID) 75 MCG tablet, Take 75 mcg by mouth daily before breakfast. , Disp: , Rfl:    lidocaine (LIDODERM) 5 %, 2 patches daily., Disp: , Rfl:    methylphenidate 10 MG ER tablet, Take 10 mg by mouth every morning., Disp: , Rfl:    metoCLOPramide (REGLAN) 5 MG tablet, Take 5 mg by mouth 2 (two) times daily., Disp: , Rfl:    Multiple Vitamin (MULTIVITAMIN WITH MINERALS) TABS tablet, Take 1 tablet by mouth daily., Disp: , Rfl:    nitrofurantoin, macrocrystal-monohydrate, (MACROBID) 100 MG capsule, Take 1 capsule by mouth once a day only after intercourse to prevent urinary tract infection. Maximum daily dose of 1 capsule., Disp: 30 capsule, Rfl: 11   nitrofurantoin, macrocrystal-monohydrate, (MACROBID) 100 MG capsule, Take 1 capsule by mouth once a day only after intercourse to prevent urinary tract infection. Maximum daily dose of 1 capsule., Disp: 30 capsule, Rfl: 2   OnabotulinumtoxinA (BOTOX IJ), Inject 1 Dose as directed every 30 (thirty) days. , Disp: , Rfl:    pregabalin (LYRICA) 75 MG capsule, Take 75 mg by mouth 2 (two) times daily., Disp: , Rfl:    promethazine (PHENERGAN) 25 MG suppository, 12.5 mg as needed., Disp: , Rfl:    Rimegepant Sulfate 75 MG TBDP, Take 1 tablet by mouth daily as needed. , Disp: , Rfl:   Past Medical Problems: Past Medical History:  Diagnosis Date   Allergic rhinitis    Anemia    Arthritis    Asthma    last used inhaler 1 month ago   Dysphagia    Dysrhythmia    SVT   Ehlers-Danlos syndrome    Female bladder prolapse    GERD (gastroesophageal reflux disease)    with pregnancy only   Lupus (HCC)    Mast cell activation syndrome (HCC)    Migraines    Pelvic pain in antepartum period in second trimester 01/17/2014   POTS (postural orthostatic tachycardia syndrome)    Pregnant 08/01/2015   PSVT (paroxysmal supraventricular tachycardia) (HCC)    could not be confirmed with 2016 study   Rectocele    Thyroid  disease    Urethral prolapse    Uterine prolapse    Vaginal Pap smear, abnormal     Past Surgical History: Past Surgical History:  Procedure Laterality Date   ABDOMINAL HYSTERECTOMY     ANTERIOR AND POSTERIOR VAGINAL REPAIR     CYSTOCELE REPAIR N/A 09/17/2017   Procedure: ANTERIOR REPAIR (CYSTOCELE);  Surgeon: Lazaro Arms, MD;  Location: [facility];  Service: Gynecology;  Laterality: N/A;   SUPRAVENTRICULAR TACHYCARDIA ABLATION  01/05/2015   unsuccessful   SUPRAVENTRICULAR TACHYCARDIA ABLATION N/A 01/05/2015   Procedure: SUPRAVENTRICULAR TACHYCARDIA ABLATION;  Surgeon: Marinus Maw, MD;  Location: [facility] CATH LAB;  Service: Cardiovascular;  Laterality: N/A;  VAGINAL HYSTERECTOMY N/A 09/17/2017   Procedure: HYSTERECTOMY VAGINAL WITH POSSIBLE BILATERAL SALPINGECTOMY;  Surgeon: Lazaro Arms, MD;  Location: [facility];  Service: Gynecology;  Laterality: N/A;    Social History: Social History   Socioeconomic History   Marital status: Married    Spouse name: Not on file   Number of children: 6   Years of education: HS   Highest education level: Not on file  Occupational History   Occupation: Web designer  Tobacco Use   Smoking status: Never   Smokeless tobacco: Never  Vaping Use   Vaping Use: Never used  Substance and Sexual Activity   Alcohol use: No   Drug use: No   Sexual activity: Yes    Birth control/protection: Surgical    Comment: hyst  Other Topics Concern   Not on file  Social History Narrative   Lives with husband   Right handed    Caffeine use: 1-2 diet cokes per day   Social Determinants of Health   Financial Resource Strain: Not on file  Food Insecurity: Not on file  Transportation Needs: Not on file  Physical Activity: Not on file  Stress: Not on file  Social Connections: Not on file  Intimate Partner Violence: Not on file    Family History: Family History  Problem Relation Age of Onset   Hypertension Mother    Hyperlipidemia  Mother    Multiple sclerosis Mother    COPD Mother    Cancer Mother        melanoma x 2   Diabetes Mother    Heart disease Father        PSVT   Hyperlipidemia Brother    Hypertension Brother    Heart disease Maternal Grandfather        heart attack   Cancer Paternal Grandmother        pancreatic cancer   Heart disease Paternal Grandmother        MI   Hyperlipidemia Paternal Grandmother    Hypertension Paternal Grandmother    Asthma Daughter    Asthma Son    Autism Son    ADD / ADHD Son    Tourette syndrome Son    Other Son        Neurological and mental issues   ADD / ADHD Son     Review of Systems: ROS Denies recent fevers, traumas, or hospitalizations.  Physical Exam: Vital Signs BP 110/76 (BP Location: Left Arm, Patient Position: Sitting, Cuff Size: Small)   Pulse (!) 104   Ht 5\' 4"  (1.626 m)   Wt 178 lb 6.4 oz (80.9 kg)   LMP 10/23/2016 (Approximate)   SpO2 97%   BMI 30.62 kg/m   Physical Exam Constitutional:      General: Not in acute distress.    Appearance: Normal appearance. Not ill-appearing.  HENT:     Head: Normocephalic and atraumatic.  Eyes:     Pupils: Pupils are equal, round. Cardiovascular:     Rate and Rhythm: Normal rate.    Pulses: Normal pulses.  Pulmonary:     Effort: No respiratory distress or increased work of breathing.  Speaks in full sentences. Abdominal:     General: Abdomen is flat. No distension.   Musculoskeletal: Normal range of motion. No lower extremity swelling or edema.  Skin:    General: Skin is warm and dry.     Findings: No erythema or rash.  Neurological:     Mental Status: Alert and oriented to  person, place, and time.  Psychiatric:        Mood and Affect: Mood normal.        Behavior: Behavior normal.    Assessment/Plan: The patient is scheduled for bilateral breast reduction with Dr. Domenica Reamer.  Risks, benefits, and alternatives of procedure discussed, questions answered and consent obtained.    Smoking  Status: Non-smoker. Last Mammogram: N/A due to age.  Caprini Score: 3; Risk Factors include: BMI greater than 25 and length of planned surgery. Recommendation for mechanical prophylaxis. Encourage early ambulation.   Pictures obtained: 05/24/2022.  Post-op Rx sent to pharmacy: Oxycodone (per Scheeler PA-C).  She reports having antiemetics at home and also reports history of long QTc.  Cautioned on combining oxycodone with her pregabalin given multiple sedating medications in conjunction with her history of falls (rheumatologic).  Patient was provided with the General Surgical Risk consent document and Pain Medication Agreement prior to their appointment.  They had adequate time to read through the risk consent documents and Pain Medication Agreement. We also discussed them in person together during this preop appointment. All of their questions were answered to their satisfaction.  Recommended calling if they have any further questions.  Risk consent form and Pain Medication Agreement to be scanned into patient's chart.  The risk that can be encountered with breast reduction were discussed and include the following but not limited to these:  Breast asymmetry, fluid accumulation, firmness of the breast, inability to breast feed, loss of nipple or areola, skin loss, decrease or no nipple sensation, fat necrosis of the breast tissue, bleeding, infection, healing delay.  There are risks of anesthesia, changes to skin sensation and injury to nerves or blood vessels.  The muscle can be temporarily or permanently injured.  You may have an allergic reaction to tape, suture, glue, blood products which can result in skin discoloration, swelling, pain, skin lesions, poor healing.  Any of these can lead to the need for revisonal surgery or stage procedures.  A reduction has potential to interfere with diagnostic procedures.  Nipple or breast piercing can increase risks of infection.  This procedure is best done when  the breast is fully developed.  Changes in the breast will continue to occur over time.  Pregnancy can alter the outcomes of previous breast reduction surgery, weight gain and weigh loss can also effect the long term appearance.     Electronically signed by: Evelena Leyden, PA-C 07/29/2022 2:36 PM

## 2022-07-30 ENCOUNTER — Telehealth: Payer: Self-pay

## 2022-07-30 NOTE — Telephone Encounter (Signed)
Faxed SX clearance to [facility]

## 2022-08-01 ENCOUNTER — Encounter: Payer: Self-pay | Admitting: Surgical

## 2022-08-01 NOTE — Progress Notes (Signed)
Surgical Clearance has been received from Laren Everts, NP for patient's upcoming bilateral breast reduction with Dr. Domenica Reamer.  Patient is medically cleared for surgery.  No specific recommendations in regards to medications

## 2022-08-01 NOTE — H&P (View-Only) (Signed)
Surgical Clearance has been received from Shannon Coward, NP for patient's upcoming bilateral breast reduction with Dr. Luppens.  Patient is medically cleared for surgery.  No specific recommendations in regards to medications  

## 2022-08-04 DIAGNOSIS — G43119 Migraine with aura, intractable, without status migrainosus: Principal | ICD-10-CM

## 2022-08-12 NOTE — Pre-Procedure Instructions (Signed)
Surgical Instructions    Your procedure is scheduled on Tuesday, August 29th.  Report to [facility] Main Entrance "A" at 6:00 A.M., then check in with the Admitting office.  Call this number if you have problems the morning of surgery:  [phone]   If you have any questions prior to your surgery date call [phone]: Open Monday-Friday 8am-4pm    Remember:  Do not eat after midnight the night before your surgery  You may drink clear liquids until 5:00 a.m. the morning of your surgery.   Clear liquids allowed are: Water, Non-Citrus Juices (without pulp), Carbonated Beverages, Clear Tea, Black Coffee Only (NO MILK, CREAM OR POWDERED CREAMER of any kind), and Gatorade.    Take these medicines the morning of surgery with A SIP OF WATER  buPROPion (WELLBUTRIN XL)  levothyroxine (SYNTHROID)  nitrofurantoin, macrocrystal-monohydrate, (MACROBID)  pregabalin (LYRICA)    Take these medications AS NEEDED: Albuterol inhaler-Please bring all inhalers with you the day of surgery.  cycloSPORINE (RESTASIS) metoCLOPramide (REGLAN) or promethazine (PHENERGAN). Rimegepant Sulfate (NURTEC)   As of today, STOP taking any Aspirin (unless otherwise instructed by your surgeon) Aleve, Naproxen, Ibuprofen, Motrin, Advil, Goody's, BC's, all herbal medications, fish oil, and all vitamins. This includes: celecoxib (CELEBREX).                     Do NOT Smoke (Tobacco/Vaping) for 24 hours prior to your procedure.  If you use a CPAP at night, you may bring your mask/headgear for your overnight stay.   Contacts, glasses, piercing's, hearing aid's, dentures or partials may not be worn into surgery, please bring cases for these belongings.    For patients admitted to the hospital, discharge time will be determined by your treatment team.   Patients discharged the day of surgery will not be allowed to drive home, and someone needs to stay with them for 24 hours.  SURGICAL WAITING ROOM  VISITATION Patients having surgery or a procedure may have no more than 2 support people in the waiting area - these visitors may rotate.   Children under the age of 57 must have an adult with them who is not the patient. If the patient needs to stay at the hospital during part of their recovery, the visitor guidelines for inpatient rooms apply. Pre-op nurse will coordinate an appropriate time for 1 support person to accompany patient in pre-op.  This support person may not rotate.   Please refer to the [facility] website for the visitor guidelines for Inpatients (after your surgery is over and you are in a regular room).    Special instructions:   [facility]- Preparing For Surgery  Before surgery, you can play an important role. Because skin is not sterile, your skin needs to be as free of germs as possible. You can reduce the number of germs on your skin by washing with CHG (chlorahexidine gluconate) Soap before surgery.  CHG is an antiseptic cleaner which kills germs and bonds with the skin to continue killing germs even after washing.    Oral Hygiene is also important to reduce your risk of infection.  Remember - BRUSH YOUR TEETH THE MORNING OF SURGERY WITH YOUR REGULAR TOOTHPASTE  Please do not use if you have an allergy to CHG or antibacterial soaps. If your skin becomes reddened/irritated stop using the CHG.  Do not shave (including legs and underarms) for at least 48 hours prior to first CHG shower. It is OK to shave your face.  Please  follow these instructions carefully.   Shower the NIGHT BEFORE SURGERY and the MORNING OF SURGERY  If you chose to wash your hair, wash your hair first as usual with your normal shampoo.  After you shampoo, rinse your hair and body thoroughly to remove the shampoo.  Use CHG Soap as you would any other liquid soap. You can apply CHG directly to the skin and wash gently with a scrungie or a clean washcloth.   Apply the CHG Soap to your body ONLY  FROM THE NECK DOWN.  Do not use on open wounds or open sores. Avoid contact with your eyes, ears, mouth and genitals (private parts). Wash Face and genitals (private parts)  with your normal soap.   Wash thoroughly, paying special attention to the area where your surgery will be performed.  Thoroughly rinse your body with warm water from the neck down.  DO NOT shower/wash with your normal soap after using and rinsing off the CHG Soap.  Pat yourself dry with a CLEAN TOWEL.  Wear CLEAN PAJAMAS to bed the night before surgery  Place CLEAN SHEETS on your bed the night before your surgery  DO NOT SLEEP WITH PETS.   Day of Surgery: Take a shower with CHG soap. Do not wear jewelry or makeup Do not wear lotions, powders, perfumes, or deodorant. Do not shave 48 hours prior to surgery.  Do not bring valuables to the hospital.  [facility] is not responsible for any belongings or valuables. Do not wear nail polish, gel polish, artificial nails, or any other type of covering on natural nails (fingers and toes) If you have artificial nails or gel coating that need to be removed by a nail salon, please have this removed prior to surgery. Artificial nails or gel coating may interfere with anesthesia's ability to adequately monitor your vital signs. Wear Clean/Comfortable clothing the morning of surgery Remember to brush your teeth WITH YOUR REGULAR TOOTHPASTE.   Please read over the following fact sheets that you were given.    If you received a COVID test during your pre-op visit  it is requested that you wear a mask when out in public, stay away from anyone that may not be feeling well and notify your surgeon if you develop symptoms. If you have been in contact with anyone that has tested positive in the last 10 days please notify you surgeon.

## 2022-08-13 ENCOUNTER — Encounter (HOSPITAL_COMMUNITY): Payer: Self-pay

## 2022-08-13 ENCOUNTER — Other Ambulatory Visit: Payer: Self-pay

## 2022-08-13 ENCOUNTER — Encounter (HOSPITAL_COMMUNITY)
Admission: RE | Admit: 2022-08-13 | Discharge: 2022-08-13 | Disposition: A | Discharge status: Home / Self Care | Payer: Medicare Other | Source: Ambulatory Visit | Attending: Plastic Surgery | Admitting: Plastic Surgery

## 2022-08-13 DIAGNOSIS — N62 Hypertrophy of breast: Secondary | ICD-10-CM | POA: Insufficient documentation

## 2022-08-13 DIAGNOSIS — Z01812 Encounter for preprocedural laboratory examination: Secondary | ICD-10-CM | POA: Insufficient documentation

## 2022-08-13 HISTORY — DX: Unspecified osteoarthritis, unspecified site: M19.90

## 2022-08-13 HISTORY — DX: Autoimmune thyroiditis: E06.3

## 2022-08-13 LAB — CBC WITH DIFFERENTIAL/PLATELET
Abs Immature Granulocytes: 0.02 10*3/uL (ref 0.00–0.07)
Basophils Absolute: 0.1 10*3/uL (ref 0.0–0.1)
Basophils Relative: 1 %
Eosinophils Absolute: 0 10*3/uL (ref 0.0–0.5)
Eosinophils Relative: 0 %
HCT: 40 % (ref 36.0–46.0)
Hemoglobin: 13.1 g/dL (ref 12.0–15.0)
Immature Granulocytes: 0 %
Lymphocytes Relative: 31 %
Lymphs Abs: 2.4 10*3/uL (ref 0.7–4.0)
MCH: 27.1 pg (ref 26.0–34.0)
MCHC: 32.8 g/dL (ref 30.0–36.0)
MCV: 82.6 fL (ref 80.0–100.0)
Monocytes Absolute: 1 10*3/uL (ref 0.1–1.0)
Monocytes Relative: 12 %
Neutro Abs: 4.5 10*3/uL (ref 1.7–7.7)
Neutrophils Relative %: 56 %
Platelets: 260 10*3/uL (ref 150–400)
RBC: 4.84 MIL/uL (ref 3.87–5.11)
RDW: 13.8 % (ref 11.5–15.5)
WBC: 8 10*3/uL (ref 4.0–10.5)
nRBC: 0 % (ref 0.0–0.2)

## 2022-08-13 LAB — BASIC METABOLIC PANEL
Anion gap: 8 (ref 5–15)
BUN: 6 mg/dL (ref 6–20)
CO2: 29 mmol/L (ref 22–32)
Calcium: 10.2 mg/dL (ref 8.9–10.3)
Chloride: 105 mmol/L (ref 98–111)
Creatinine, Ser: 0.75 mg/dL (ref 0.44–1.00)
GFR, Estimated: >60 mL/min (ref >60)
Glucose, Bld: 111 mg/dL — ABNORMAL HIGH (ref 70–99)
Potassium: 4 mmol/L (ref 3.5–5.1)
Sodium: 142 mmol/L (ref 135–145)

## 2022-08-13 NOTE — Progress Notes (Addendum)
PCP - Laren Everts, MSN, AGNP-C Cardiologist - Dr. Normand Sloop [place]  PPM/ICD - n/a  Chest x-ray - 10/30/21-CE EKG - 03/28/22-CE; requested from [facility] ED. Stress Test - denies ECHO - 11/26/19-CE Cardiac Cath - denies  Sleep Study - denies CPAP - denies  Blood Thinner Instructions: n/a Aspirin Instructions: n/a  ERAS Protcol -Clear liquids until 0500 DOS PRE-SURGERY Ensure or G2- none ordered.  COVID TEST- n/a  Anesthesia review: Yes, medical/cardiac hx. Pending EKG tracing from [facility] ED.  Patient denies shortness of breath, fever, cough and chest pain at PAT appointment   All instructions explained to the patient, with a verbal understanding of the material. Patient agrees to go over the instructions while at home for a better understanding. Patient also instructed to self quarantine after being tested for COVID-19. The opportunity to ask questions was provided.

## 2022-08-14 NOTE — Progress Notes (Signed)
Anesthesia Chart Review:  Case: [id number] Date/Time: 08/20/22 0740   Procedure: MAMMARY REDUCTION  (BREAST) (Bilateral: Breast) - 3 hours   Anesthesia type: General   Pre-op diagnosis: Breast Hypertrophy   [facility]: [facility] ROOM 17 / [facility] OR   Surgeons: Sharon Napoleon, MD       DISCUSSION: Patient is a 36 year old female scheduled for the above procedure.  History includes never smoker, PSVT (s/p EP study 01/05/15), asthma, anemia, POTS, SLE, Ehlers-Danlos syndrome, mast cell activation syndrome, acute ascending colitis (03/31/22), psoriatic arthritis, dysphagia, hysterectomy.    VS: BP (!) (P) 124/90   Pulse (P) 94   Resp (P) 18   Ht (P) 5\' 3"  (1.6 m)   Wt (P) 81.7 kg   LMP 10/23/2016 (Approximate)   SpO2 (P) 100%   BMI (P) 31.90 kg/m    PROVIDERS: [facility], MD is rheumatologist [phone]). Last visit 07/25/22 for follow-up "undifferentiated connective tissue disease and psoriatic arthritis". She requested steroid taper of flare of her Bell's Palsy.  She is required multiple surgeries due to her Ehlers-Danlos syndrome.  She has tried multiple medications for her connective tissue disease. "Previously on MTX-stopped due to recurrent infections, HCQ- stopped due to QT prolongation, Otezla- stopped due to mood changes, Cosentyx- stopped due to allergic reaction." Discussed starting Cimzia infusions.  67-month follow-up planned. - Last EP visit with 6-month, MD was on 01/05/15 for EP Procedure. only inducible tachycardia was ST.    LABS: Labs reviewed: Acceptable for surgery. (all labs ordered are listed, but only abnormal results are displayed)  Labs Reviewed  BASIC METABOLIC PANEL - Abnormal; Notable for the following components:      Result Value   Glucose, Bld 111 (*)    All other components within normal limits  CBC WITH DIFFERENTIAL/PLATELET     IMAGES: MRA Chest 03/27/22 [facility]): Impression: Mild dynamic narrowing of the subclavian veins bilaterally  with arms in abduction as detailed above. No well-defined collaterals or evidence of thrombosis.  CXR C-spine 12/13/21 [facility]): Impression: Normal cervical spine radiographs  MRI C-spine 12/05/21 [facility]): Impression: Straightening of cervical lordosis. Otherwise unremarkable MRI of cervical spine, with no significant spinal canal or neural foraminal narrowing.   EKG: 03/28/22 [facility]): Copy on chart ST at 123 bpm Nonspecific ST/T wave changes    CV: Echo 11/26/19 ([facility]): INTERPRETATION  NORMAL LEFT VENTRICULAR SYSTOLIC FUNCTION  NORMAL RIGHT VENTRICULAR SYSTOLIC FUNCTION  MILD VALVULAR REGURGITATION (trivial AR/MR/TR/PR)  NO VALVULAR STENOSIS   EP Procedure 01/05/15: - Radiofrequency energy application. Because we could only induce sinus tachycardia which was sustained, RF energy was not delivered. Conclusion:  Only inducible tachycardia was sinus tachycardia. There was non-sustained short RP tachycardia which could not be mapped.   Past Medical History:  Diagnosis Date   Allergic rhinitis    Anemia    Arthritis    Asthma    last used inhaler 1 month ago   Dysphagia    Dysrhythmia    SVT   Ehlers-Danlos syndrome    Female bladder prolapse    GERD (gastroesophageal reflux disease)    with pregnancy only   Lupus (HCC)    Mast cell activation syndrome (HCC)    Migraines    Osteoarthritis    Pelvic pain in antepartum period in second trimester 01/17/2014   POTS (postural orthostatic tachycardia syndrome)    Pregnant 08/01/2015   PSVT (paroxysmal supraventricular tachycardia) (HCC)    could not be confirmed with 2016 study  Rectocele    Thyroid disease    Urethral prolapse    Uterine prolapse    Vaginal Pap smear, abnormal     Past Surgical History:  Procedure Laterality Date   ABDOMINAL HYSTERECTOMY     ANTERIOR AND POSTERIOR VAGINAL REPAIR     CYSTOCELE REPAIR N/A 09/17/2017   Procedure: ANTERIOR REPAIR (CYSTOCELE);  Surgeon: Lazaro Arms,  MD;  Location: [facility]S;  Service: Gynecology;  Laterality: N/A;   KNEE SURGERY Left 11/2021   KNEE SURGERY Right 2023   LIPOSUCTION  03/2022   SUPRAVENTRICULAR TACHYCARDIA ABLATION  01/05/2015   unsuccessful   SUPRAVENTRICULAR TACHYCARDIA ABLATION N/A 01/05/2015   Procedure: SUPRAVENTRICULAR TACHYCARDIA ABLATION;  Surgeon: Marinus Maw, MD;  Location: [facility] CATH LAB;  Service: Cardiovascular;  Laterality: N/A;   VAGINAL HYSTERECTOMY N/A 09/17/2017   Procedure: HYSTERECTOMY VAGINAL WITH POSSIBLE BILATERAL SALPINGECTOMY;  Surgeon: Lazaro Arms, MD;  Location: [facility]S;  Service: Gynecology;  Laterality: N/A;   WRIST GANGLION EXCISION Left    x2    MEDICATIONS:  albuterol (PROVENTIL HFA;VENTOLIN HFA) 108 (90 Base) MCG/ACT inhaler   betamethasone dipropionate 0.05 % cream   botulinum toxin Type A (BOTOX) 200 units injection   buPROPion (WELLBUTRIN XL) 300 MG 24 hr tablet   Calcium-Vitamin D-Vitamin K (VIACTIV CALCIUM PLUS D) 650-12.5-40 MG-MCG-MCG CHEW   celecoxib (CELEBREX) 200 MG capsule   cholecalciferol (VITAMIN D3) 25 MCG (1000 UNIT) tablet   clobetasol cream (TEMOVATE) 0.05 %   cromolyn (GASTROCROM) 100 MG/5ML solution   cycloSPORINE (RESTASIS) 0.05 % ophthalmic emulsion   desonide (DESOWEN) 0.05 % ointment   levothyroxine (SYNTHROID) 75 MCG tablet   lidocaine (LIDODERM) 5 %   methylphenidate 10 MG ER tablet   metoCLOPramide (REGLAN) 5 MG tablet   Multiple Vitamin (MULTIVITAMIN WITH MINERALS) TABS tablet   nitrofurantoin, macrocrystal-monohydrate, (MACROBID) 100 MG capsule   nystatin cream (MYCOSTATIN)   pregabalin (LYRICA) 100 MG capsule   promethazine (PHENERGAN) 25 MG suppository   Rimegepant Sulfate (NURTEC) 75 MG TBDP   No current facility-administered medications for this encounter.

## 2022-08-15 ENCOUNTER — Encounter (HOSPITAL_COMMUNITY): Payer: Self-pay

## 2022-08-15 NOTE — Anesthesia Preprocedure Evaluation (Addendum)
Anesthesia Evaluation  Patient identified by MRN, date of birth, ID band Patient awake    Reviewed: Allergy & Precautions, NPO status , Patient's Chart, lab work & pertinent test results  Airway Mallampati: II  TM Distance: >3 FB Neck ROM: Full    Dental no notable dental hx.    Pulmonary asthma ,    Pulmonary exam normal        Cardiovascular hypertension, Pt. on medications and Pt. on home beta blockers + dysrhythmias Supra Ventricular Tachycardia  Rhythm:Regular Rate:Normal     Neuro/Psych  Headaches, negative psych ROS   GI/Hepatic Neg liver ROS, GERD  ,  Endo/Other  negative endocrine ROS  Renal/GU negative Renal ROS  negative genitourinary   Musculoskeletal  (+) Arthritis , SLE   Abdominal Normal abdominal exam  (+)   Peds negative pediatric ROS (+)  Hematology  (+) Blood dyscrasia, anemia ,   Anesthesia Other Findings   Reproductive/Obstetrics                            Anesthesia Physical Anesthesia Plan  ASA: 3  Anesthesia Plan: General   Post-op Pain Management: Celebrex PO (pre-op)* and Tylenol PO (pre-op)*   Induction: Intravenous  PONV Risk Score and Plan: 3 and Dexamethasone, Midazolam, Treatment may vary due to age or medical condition, Scopolamine patch - Pre-op and Amisulpride  Airway Management Planned: Mask and Oral ETT  Additional Equipment: None  Intra-op Plan:   Post-operative Plan: Extubation in OR  Informed Consent: I have reviewed the patients History and Physical, chart, labs and discussed the procedure including the risks, benefits and alternatives for the proposed anesthesia with the patient or authorized representative who has indicated his/her understanding and acceptance.     Dental advisory given  Plan Discussed with: CRNA  Anesthesia Plan Comments: (PAT note written 08/15/2022 by Shonna Chock, PA-C. Lab Results      Component                 Value               Date                      WBC                      8.0                 08/13/2022                HGB                      13.1                08/13/2022                HCT                      40.0                08/13/2022                MCV                      82.6                08/13/2022  PLT                      260                 08/13/2022           Lab Results      Component                Value               Date                      NA                       142                 08/13/2022                K                        4.0                 08/13/2022                CO2                      29                  08/13/2022                GLUCOSE                  111 (H)             08/13/2022                BUN                      6                   08/13/2022                CREATININE               0.75                08/13/2022                CALCIUM                  10.2                08/13/2022                GFRNONAA                 >60                 08/13/2022          )      Anesthesia Quick Evaluation

## 2022-08-19 ENCOUNTER — Encounter (HOSPITAL_COMMUNITY): Payer: Self-pay | Admitting: Plastic Surgery

## 2022-08-20 ENCOUNTER — Ambulatory Visit (HOSPITAL_BASED_OUTPATIENT_CLINIC_OR_DEPARTMENT_OTHER): Payer: Medicare Other | Admitting: Certified Registered Nurse Anesthetist

## 2022-08-20 ENCOUNTER — Other Ambulatory Visit: Payer: Self-pay

## 2022-08-20 ENCOUNTER — Encounter (HOSPITAL_COMMUNITY)
Admission: RE | Disposition: A | Discharge status: Home / Self Care | Payer: Self-pay | Source: Home / Self Care | Attending: Plastic Surgery

## 2022-08-20 ENCOUNTER — Ambulatory Visit (HOSPITAL_COMMUNITY): Payer: Medicare Other | Admitting: Vascular Surgery

## 2022-08-20 ENCOUNTER — Ambulatory Visit (HOSPITAL_COMMUNITY)
Admission: RE | Admit: 2022-08-20 | Discharge: 2022-08-20 | Disposition: A | Discharge status: Home / Self Care | Payer: Medicare Other | Attending: Plastic Surgery | Admitting: Plastic Surgery

## 2022-08-20 ENCOUNTER — Encounter (HOSPITAL_COMMUNITY): Payer: Self-pay | Admitting: Plastic Surgery

## 2022-08-20 DIAGNOSIS — I471 Supraventricular tachycardia: Secondary | ICD-10-CM

## 2022-08-20 DIAGNOSIS — M549 Dorsalgia, unspecified: Secondary | ICD-10-CM

## 2022-08-20 DIAGNOSIS — N62 Hypertrophy of breast: Secondary | ICD-10-CM | POA: Diagnosis not present

## 2022-08-20 DIAGNOSIS — M542 Cervicalgia: Secondary | ICD-10-CM | POA: Diagnosis not present

## 2022-08-20 DIAGNOSIS — I10 Essential (primary) hypertension: Secondary | ICD-10-CM

## 2022-08-20 DIAGNOSIS — J45909 Unspecified asthma, uncomplicated: Secondary | ICD-10-CM

## 2022-08-20 DIAGNOSIS — D649 Anemia, unspecified: Secondary | ICD-10-CM | POA: Diagnosis not present

## 2022-08-20 HISTORY — PX: BREAST REDUCTION SURGERY: SHX8

## 2022-08-20 SURGERY — MAMMOPLASTY, REDUCTION
Anesthesia: General | Site: Breast | Laterality: Bilateral

## 2022-08-20 MED ORDER — CHLORHEXIDINE GLUCONATE 0.12 % MT SOLN
15.0000 mL | Freq: Once | OROMUCOSAL | Status: AC
Start: 2022-08-20 — End: 2022-08-20

## 2022-08-20 MED ORDER — CHLORHEXIDINE GLUCONATE CLOTH 2 % EX PADS: 6.0000 | MEDICATED_PAD | Freq: Once | CUTANEOUS | Status: DC

## 2022-08-20 MED ORDER — DIPHENHYDRAMINE HCL 12.5 MG/5ML PO LIQD
25.0000 mg | Freq: Once | ORAL | Status: AC
  Administered 2022-08-20: 25 mg via ORAL
  Filled 2022-08-20: qty 10

## 2022-08-20 MED ORDER — LACTATED RINGERS IV SOLN: INTRAVENOUS | Status: DC

## 2022-08-20 MED ORDER — LIDOCAINE 2% (20 MG/ML) 5 ML SYRINGE
INTRAMUSCULAR | Status: DC | PRN
  Administered 2022-08-20: 60 mg via INTRAVENOUS

## 2022-08-20 MED ORDER — PROPOFOL 10 MG/ML IV BOLUS
INTRAVENOUS | Status: AC
  Filled 2022-08-20: qty 20

## 2022-08-20 MED ORDER — FENTANYL CITRATE (PF) 250 MCG/5ML IJ SOLN
INTRAMUSCULAR | Status: AC
  Filled 2022-08-20: qty 5

## 2022-08-20 MED ORDER — ROCURONIUM BROMIDE 10 MG/ML (PF) SYRINGE
PREFILLED_SYRINGE | INTRAVENOUS | Status: AC
  Filled 2022-08-20: qty 10

## 2022-08-20 MED ORDER — MIDAZOLAM HCL 2 MG/2ML IJ SOLN
INTRAMUSCULAR | Status: AC
  Filled 2022-08-20: qty 2

## 2022-08-20 MED ORDER — FENTANYL CITRATE (PF) 250 MCG/5ML IJ SOLN
INTRAMUSCULAR | Status: DC | PRN
  Administered 2022-08-20 (×4): 50 ug via INTRAVENOUS
  Administered 2022-08-20: 100 ug via INTRAVENOUS

## 2022-08-20 MED ORDER — DEXAMETHASONE SODIUM PHOSPHATE 10 MG/ML IJ SOLN
INTRAMUSCULAR | Status: DC | PRN
  Administered 2022-08-20: 10 mg via INTRAVENOUS

## 2022-08-20 MED ORDER — ROCURONIUM BROMIDE 10 MG/ML (PF) SYRINGE
PREFILLED_SYRINGE | INTRAVENOUS | Status: DC | PRN
  Administered 2022-08-20: 60 mg via INTRAVENOUS
  Administered 2022-08-20: 20 mg via INTRAVENOUS

## 2022-08-20 MED ORDER — AMISULPRIDE (ANTIEMETIC) 5 MG/2ML IV SOLN
10.0000 mg | Freq: Once | INTRAVENOUS | Status: DC | PRN
Start: 2022-08-20 — End: 2022-08-20

## 2022-08-20 MED ORDER — BUPIVACAINE-EPINEPHRINE 0.25% -1:200000 IJ SOLN
INTRAMUSCULAR | Status: DC | PRN
  Administered 2022-08-20: 30 mL

## 2022-08-20 MED ORDER — DIPHENHYDRAMINE HCL 50 MG/ML IJ SOLN
INTRAMUSCULAR | Status: DC | PRN
  Administered 2022-08-20: 12.5 mg via INTRAVENOUS

## 2022-08-20 MED ORDER — SUGAMMADEX SODIUM 200 MG/2ML IV SOLN
INTRAVENOUS | Status: DC | PRN
  Administered 2022-08-20: 200 mg via INTRAVENOUS

## 2022-08-20 MED ORDER — ACETAMINOPHEN 500 MG PO TABS
1000.0000 mg | ORAL_TABLET | Freq: Once | ORAL | Status: AC
  Administered 2022-08-20: 1000 mg via ORAL
  Filled 2022-08-20: qty 2

## 2022-08-20 MED ORDER — CELECOXIB 200 MG PO CAPS
200.0000 mg | ORAL_CAPSULE | Freq: Once | ORAL | Status: AC
  Administered 2022-08-20: 200 mg via ORAL
  Filled 2022-08-20: qty 1

## 2022-08-20 MED ORDER — FENTANYL CITRATE (PF) 100 MCG/2ML IJ SOLN
25.0000 ug | INTRAMUSCULAR | Status: DC | PRN
  Administered 2022-08-20: 50 ug via INTRAVENOUS

## 2022-08-20 MED ORDER — CHLORHEXIDINE GLUCONATE 0.12 % MT SOLN
OROMUCOSAL | Status: AC
  Administered 2022-08-20: 15 mL
  Filled 2022-08-20: qty 15

## 2022-08-20 MED ORDER — LACTATED RINGERS IV SOLN: INTRAVENOUS | Status: DC | PRN

## 2022-08-20 MED ORDER — DEXAMETHASONE SODIUM PHOSPHATE 10 MG/ML IJ SOLN
INTRAMUSCULAR | Status: AC
  Filled 2022-08-20: qty 1

## 2022-08-20 MED ORDER — SCOPOLAMINE 1 MG/3DAYS TD PT72
1.0000 | MEDICATED_PATCH | TRANSDERMAL | Status: DC
  Administered 2022-08-20: 1.5 mg via TRANSDERMAL
  Filled 2022-08-20: qty 1

## 2022-08-20 MED ORDER — PROPOFOL 10 MG/ML IV BOLUS
INTRAVENOUS | Status: DC | PRN
  Administered 2022-08-20: 160 mg via INTRAVENOUS

## 2022-08-20 MED ORDER — MIDAZOLAM HCL 2 MG/2ML IJ SOLN
INTRAMUSCULAR | Status: DC | PRN
  Administered 2022-08-20: 2 mg via INTRAVENOUS

## 2022-08-20 MED ORDER — DIPHENHYDRAMINE HCL 12.5 MG/5ML PO ELIX
ORAL_SOLUTION | ORAL | Status: AC
  Filled 2022-08-20: qty 10

## 2022-08-20 MED ORDER — ORAL CARE MOUTH RINSE: 15.0000 mL | Freq: Once | OROMUCOSAL | Status: AC

## 2022-08-20 MED ORDER — FENTANYL CITRATE (PF) 100 MCG/2ML IJ SOLN
INTRAMUSCULAR | Status: AC
  Filled 2022-08-20: qty 2

## 2022-08-20 MED ORDER — EPINEPHRINE PF 1 MG/ML IJ SOLN
INTRAMUSCULAR | Status: AC
  Filled 2022-08-20: qty 1

## 2022-08-20 MED ORDER — 0.9 % SODIUM CHLORIDE (POUR BTL) OPTIME
TOPICAL | Status: DC | PRN
  Administered 2022-08-20: 1000 mL

## 2022-08-20 MED ORDER — LIDOCAINE 2% (20 MG/ML) 5 ML SYRINGE
INTRAMUSCULAR | Status: AC
  Filled 2022-08-20: qty 5

## 2022-08-20 MED ORDER — CEFAZOLIN SODIUM-DEXTROSE 2-4 GM/100ML-% IV SOLN
2.0000 g | INTRAVENOUS | Status: AC
  Administered 2022-08-20: 2 g via INTRAVENOUS
  Filled 2022-08-20: qty 100

## 2022-08-20 MED ORDER — BUPIVACAINE HCL (PF) 0.25 % IJ SOLN
INTRAMUSCULAR | Status: AC
  Filled 2022-08-20: qty 30

## 2022-08-20 SURGICAL SUPPLY — 70 items
BAG COUNTER SPONGE SURGICOUNT (BAG) IMPLANT
BENZOIN TINCTURE PRP APPL 2/3 (GAUZE/BANDAGES/DRESSINGS) ×2 IMPLANT
BINDER BREAST LRG (GAUZE/BANDAGES/DRESSINGS) IMPLANT
BIOPATCH RED 1 DISK 7.0 (GAUZE/BANDAGES/DRESSINGS) ×2 IMPLANT
BLADE SURG 10 STRL SS (BLADE) ×5 IMPLANT
BLADE SURG 15 STRL LF DISP TIS (BLADE) IMPLANT
BLADE SURG 15 STRL SS (BLADE) ×3
BNDG GAUZE DERMACEA FLUFF 4 (GAUZE/BANDAGES/DRESSINGS) ×2 IMPLANT
CHLORAPREP W/TINT 26 (MISCELLANEOUS) ×2 IMPLANT
COVER SURGICAL LIGHT HANDLE (MISCELLANEOUS) IMPLANT
DEVICE DISSECT PLASMABLAD 3.0S (MISCELLANEOUS) IMPLANT
DRAIN CHANNEL 15F RND FF W/TCR (WOUND CARE) ×2 IMPLANT
DRAPE HALF SHEET 40X57 (DRAPES) ×2 IMPLANT
DRAPE ORTHO SPLIT 77X108 STRL (DRAPES) ×2
DRAPE SURG ORHT 6 SPLT 77X108 (DRAPES) ×2 IMPLANT
DRESSING MEPILEX FLEX 4X4 (GAUZE/BANDAGES/DRESSINGS) ×2 IMPLANT
DRSG MEPILEX BORDER 4X12 (GAUZE/BANDAGES/DRESSINGS) ×2 IMPLANT
DRSG MEPILEX FLEX 4X4 (GAUZE/BANDAGES/DRESSINGS) ×2
DRSG MEPILEX SACRM 8.7X9.8 (GAUZE/BANDAGES/DRESSINGS) ×1 IMPLANT
DRSG MEPITEL 4X7.2 (GAUZE/BANDAGES/DRESSINGS) IMPLANT
DRSG MEPITEL 8X12 (GAUZE/BANDAGES/DRESSINGS) IMPLANT
DRSG PAD ABDOMINAL 8X10 ST (GAUZE/BANDAGES/DRESSINGS) IMPLANT
DRSG TEGADERM 4X4.75 (GAUZE/BANDAGES/DRESSINGS) ×2 IMPLANT
ELECT BLADE 4.0 EZ CLEAN MEGAD (MISCELLANEOUS) ×2
ELECT REM PT RETURN 9FT ADLT (ELECTROSURGICAL) ×1
ELECTRODE BLDE 4.0 EZ CLN MEGD (MISCELLANEOUS) IMPLANT
ELECTRODE REM PT RTRN 9FT ADLT (ELECTROSURGICAL) ×1 IMPLANT
EVACUATOR SILICONE 100CC (DRAIN) ×2 IMPLANT
GLOVE BIO SURGEON STRL SZ 6.5 (GLOVE) IMPLANT
GLOVE BIO SURGEON STRL SZ7.5 (GLOVE) ×1 IMPLANT
GLOVE BIOGEL M STRL SZ7.5 (GLOVE) ×1 IMPLANT
GLOVE BIOGEL PI IND STRL 7.5 (GLOVE) ×1 IMPLANT
GLOVE BIOGEL PI INDICATOR 7.5 (GLOVE) ×3
GLOVE ECLIPSE 6.5 STRL STRAW (GLOVE) IMPLANT
GLOVE SURG SS PI 7.5 STRL IVOR (GLOVE) IMPLANT
GOWN STRL REUS W/ TWL LRG LVL3 (GOWN DISPOSABLE) ×2 IMPLANT
GOWN STRL REUS W/TWL LRG LVL3 (GOWN DISPOSABLE) ×4
GOWN STRL REUS W/TWL XL LVL3 (GOWN DISPOSABLE) ×1 IMPLANT
KIT BASIN OR (CUSTOM PROCEDURE TRAY) ×1 IMPLANT
MARKER SKIN DUAL TIP RULER LAB (MISCELLANEOUS) ×1 IMPLANT
NDL HYPO 25GX1X1/2 BEV (NEEDLE) IMPLANT
NDL SAFETY ECLIPSE 18X1.5 (NEEDLE) IMPLANT
NDL SPNL 18GX3.5 QUINCKE PK (NEEDLE) ×1 IMPLANT
NEEDLE HYPO 18GX1.5 SHARP (NEEDLE)
NEEDLE HYPO 25GX1X1/2 BEV (NEEDLE) IMPLANT
NEEDLE SPNL 18GX3.5 QUINCKE PK (NEEDLE) ×1 IMPLANT
NS IRRIG 1000ML POUR BTL (IV SOLUTION) ×1 IMPLANT
PACK GENERAL/GYN (CUSTOM PROCEDURE TRAY) ×1 IMPLANT
PENCIL SMOKE EVACUATOR (MISCELLANEOUS) IMPLANT
PIN SAFETY STERILE (MISCELLANEOUS) ×1 IMPLANT
PLASMABLADE 3.0S (MISCELLANEOUS)
SPONGE T-LAP 18X18 ~~LOC~~+RFID (SPONGE) IMPLANT
STAPLER INSORB 30 2030 C-SECTI (MISCELLANEOUS) ×2 IMPLANT
STAPLER VISISTAT 35W (STAPLE) ×1 IMPLANT
STRIP CLOSURE SKIN 1/2X4 (GAUZE/BANDAGES/DRESSINGS) ×3 IMPLANT
SUT MNCRL AB 4-0 PS2 18 (SUTURE) ×2 IMPLANT
SUT MON AB 5-0 PS2 18 (SUTURE) ×1 IMPLANT
SUT PDS AB 3-0 SH 27 (SUTURE) ×4 IMPLANT
SUT PDS AB 4-0 SH 27 (SUTURE) ×3 IMPLANT
SUT SILK 2 0 SH (SUTURE) ×2 IMPLANT
SUT VIC AB 2-0 SH 27 (SUTURE) ×4
SUT VIC AB 2-0 SH 27XBRD (SUTURE) ×2 IMPLANT
SUT VLOC 90 P-14 23 (SUTURE) ×2 IMPLANT
SYR 50ML LL SCALE MARK (SYRINGE) IMPLANT
SYR CONTROL 10ML LL (SYRINGE) IMPLANT
TOWEL GREEN STERILE FF (TOWEL DISPOSABLE) ×1 IMPLANT
TRAY FOLEY W/BAG SLVR 16FR (SET/KITS/TRAYS/PACK)
TRAY FOLEY W/BAG SLVR 16FR ST (SET/KITS/TRAYS/PACK) IMPLANT
TUBING INFILTRATION IT-10001 (TUBING) ×1 IMPLANT
TUBING SET GRADUATE ASPIR 12FT (MISCELLANEOUS) IMPLANT

## 2022-08-20 NOTE — Transfer of Care (Signed)
Immediate Anesthesia Transfer of Care Note  Patient: Sharon Rowland  Procedure(s) Performed: MAMMARY REDUCTION  (BREAST) with Liposuction (Bilateral: Breast)  Patient Location: PACU  Anesthesia Type:General  Level of Consciousness: drowsy  Airway & Oxygen Therapy: Patient Spontanous Breathing  Post-op Assessment: Report given to RN and Post -op Vital signs reviewed and stable  Post vital signs: Reviewed and stable  Last Vitals:  Vitals Value Taken Time  BP 115/74 08/20/22 1045  Temp 36.7 C 08/20/22 1045  Pulse 104 08/20/22 1048  Resp 18 08/20/22 1048  SpO2 98 % 08/20/22 1048  Vitals shown include unvalidated device data.  Last Pain:  Vitals:   08/20/22 1045  TempSrc:   PainSc: Asleep      Patients Stated Pain Goal: 2 (08/20/22 0726)  Complications: No notable events documented.

## 2022-08-20 NOTE — Discharge Instructions (Addendum)
Activity As tolerated. NO showers for 3 days. Keep binder on breasts until then. After showering, put binder back on, this is important for compression. NO driving while in pain, taking pain medication or if you are unable to safely react to traffic. No heavy activities  Take Pain medication (Oxycodone) as needed for severe pain. Otherwise, you can use ibuprofen or tylenol as needed. Avoid more than 3,000 mg of tylenol in 24 hours.  You do not need an antibiotic post-operatively unless this was discussed with the provider at your pre-op appointment.  Diet: Regular. Drink plenty of fluids and eat healthy (high protein, low carbs), Try to optimize your nutrition with plenty of fruits and vegetables to improve healing. Protein shakes are a good option.  Wound Care: Keep dressing clean & dry. You may change bandages after showering if you continue to notice some drainage. You can reuse bandages if they are not dirty/soiled. Mild wound drainage is common after breast reduction surgery and should not be cause for alarm.  Special Instructions: Call Doctor if any unusual problems occur such as pain, excessive Bleeding, unrelieved Nausea/vomiting, Fever &/or chills  Follow-up appointment: Previously scheduled.

## 2022-08-20 NOTE — Anesthesia Postprocedure Evaluation (Signed)
Anesthesia Post Note  Patient: Sharon Rowland  Procedure(s) Performed: MAMMARY REDUCTION  (BREAST) with Liposuction (Bilateral: Breast)     Patient location during evaluation: PACU Anesthesia Type: General Level of consciousness: awake and alert Pain management: pain level controlled Vital Signs Assessment: post-procedure vital signs reviewed and stable Respiratory status: spontaneous breathing, nonlabored ventilation, respiratory function stable and patient connected to nasal cannula oxygen Cardiovascular status: blood pressure returned to baseline and stable Postop Assessment: no apparent nausea or vomiting Anesthetic complications: no   No notable events documented.  Last Vitals:  Vitals:   08/20/22 1145 08/20/22 1200  BP: 123/77 119/73  Pulse: 92 76  Resp: 10 14  Temp:  36.7 C  SpO2: 97% 96%    Last Pain:  Vitals:   08/20/22 1059  TempSrc:   PainSc: 7                  [name]

## 2022-08-20 NOTE — Anesthesia Procedure Notes (Signed)
Procedure Name: Intubation Date/Time: 08/20/2022 7:59 AM  Performed by: Carolan Clines, CRNAPre-anesthesia Checklist: Patient identified, Emergency Drugs available, Suction available and Patient being monitored Patient Re-evaluated:Patient Re-evaluated prior to induction Oxygen Delivery Method: Circle System Utilized Preoxygenation: Pre-oxygenation with 100% oxygen Induction Type: IV induction Ventilation: Mask ventilation without difficulty Laryngoscope Size: Mac and 3 Grade View: Grade I Tube type: Oral Tube size: 7.0 mm Number of attempts: 1 Airway Equipment and Method: Stylet Placement Confirmation: ETT inserted through vocal cords under direct vision, positive ETCO2 and breath sounds checked- equal and bilateral Secured at: 22 cm Tube secured with: Tape Dental Injury: Teeth and Oropharynx as per pre-operative assessment

## 2022-08-20 NOTE — Op Note (Signed)
Operative Note   DATE OF OPERATION: 08/20/2022  LOCATION: [facility]   SURGICAL DEPARTMENT: Plastic Surgery  PREOPERATIVE DIAGNOSES: Bilateral symptomatic macromastia.  POSTOPERATIVE DIAGNOSES:  same  PROCEDURE: Bilateral breast reduction with superomedial pedicle.  SURGEON: Janne Napoleon, MD  ASSISTANT: Zadie Cleverly, [facility]  ANESTHESIA: General.  COMPLICATIONS: None.   INDICATIONS FOR PROCEDURE:  The patient, Sharon Rowland is a 36 y.o. female born on 1986/08/03, is here for treatment of bilateral symptomatic macromastia. MRN: [id number]  CONSENT:  Informed consent was obtained directly from the patient. Risks, benefits and alternatives were fully discussed. Specific risks including but not limited to bleeding, infection, hematoma, seroma, scarring, pain, infection, contracture, asymmetry, wound healing problems, and need for further surgery were all discussed. The patient did have an ample opportunity to have questions answered to satisfaction.   DESCRIPTION OF PROCEDURE:  The patient was marked preoperatively for a Wise pattern skin excision.  The patient was taken to the operating room. SCDs were placed and antibiotics were given. General anesthesia was administered.The patient's operative site was prepped and draped in a sterile fashion. A time out was performed and all information was confirmed to be correct.  Right Breast: The breast was infiltrated with tumescent solution to help with hemostasis.  The nipple was marked with a cookie cutter.  A superomedial pedicle was drawn out with the base of at least 8 cm in size.  A breast tourniquet was then applied and the pedicle was de-epithelialized.  Breast tourniquet was then let down and all incisions were made with a 10 blade.  The pedicle was then isolated down to the chest wall with cautery and the excision was performed removing tissue primarily inferiorly and laterally.  Hemostasis was obtained and the wound was  stapled closed.  Left breast:  The breast was infiltrated with tumescent solution to help with hemostasis.  The nipple was marked with a cookie cutter.  A superomedial pedicle was drawn out with the base of at least 8 cm in size.  A breast tourniquet was then applied and the pedicle was de-epithelialized.  Breast tourniquet was then let down and all incisions were made with a 10 blade.  The pedicle was then isolated down to the chest wall with cautery and the excision was performed removing tissue primarily inferiorly and laterally.  Hemostasis was obtained and the wound was stapled closed.  Patient was then set up to check for size and symmetry.  Minor modifications were made.  This resulted in a total of 275 g removed from the right side and 269 g removed from the left side.  The inframammary incision was closed with a combination of buried in-sorb staples, 3-0 PDS and a running V-lock 90.  The vertical and periareolar limbs were closed with interrupted buried 4-0 PDS and a running V-lock 90 suture.  Steri-Strips were then applied along with a soft dressing and breast binder.  The advanced practice practitioner (APP) assisted throughout the case.  The APP was essential in retraction and counter traction when needed to make the case progress smoothly.  This retraction and assistance made it possible to see the tissue planes for the procedure.  The assistance was needed for hemostasis, tissue re-approximation and closure of the incision site.  On the lateral breast there was noted to be some fullness.  Approximately 200 cc of tumescent solution was infiltrated into the lateral breast as it transitions into the axilla and 200 cc of Lipo aspirate was removed from each lateral  breast which resulted in improved contour.  The patient tolerated the procedure well.  There were no complications. The patient was allowed to wake from anesthesia, extubated and taken to the recovery room in satisfactory condition.  I  was present for the entire procedure.

## 2022-08-20 NOTE — Interval H&P Note (Signed)
History and Physical Interval Note:  08/20/2022 7:35 AM  Sharon Rowland  has presented today for surgery, with the diagnosis of Breast Hypertrophy.  The various methods of treatment have been discussed with the patient and family. After consideration of risks, benefits and other options for treatment, the patient has consented to  Procedure(s) with comments: MAMMARY REDUCTION  (BREAST) (Bilateral) - 3 hours as a surgical intervention.  The patient's history has been reviewed, patient examined, no change in status, stable for surgery.  I have reviewed the patient's chart and labs.  Questions were answered to the patient's satisfaction.     Janne Napoleon

## 2022-08-21 ENCOUNTER — Encounter (HOSPITAL_COMMUNITY): Payer: Self-pay | Admitting: Plastic Surgery

## 2022-08-21 LAB — SURGICAL PATHOLOGY

## 2022-08-22 ENCOUNTER — Encounter: Payer: Self-pay | Admitting: Plastic Surgery

## 2022-08-22 DIAGNOSIS — H52523 Paresis of accommodation, bilateral: Secondary | ICD-10-CM | POA: Diagnosis not present

## 2022-08-27 DIAGNOSIS — M25551 Pain in right hip: Secondary | ICD-10-CM | POA: Diagnosis not present

## 2022-08-27 DIAGNOSIS — S73191A Other sprain of right hip, initial encounter: Secondary | ICD-10-CM | POA: Diagnosis not present

## 2022-08-27 DIAGNOSIS — M76891 Other specified enthesopathies of right lower limb, excluding foot: Secondary | ICD-10-CM | POA: Diagnosis not present

## 2022-08-27 DIAGNOSIS — M25859 Other specified joint disorders, unspecified hip: Secondary | ICD-10-CM | POA: Diagnosis not present

## 2022-08-29 ENCOUNTER — Telehealth: Admit: 2022-08-29 | Discharge: 2022-08-30 | Payer: MEDICARE

## 2022-08-29 DIAGNOSIS — M24872 Other specific joint derangements of left ankle, not elsewhere classified: Secondary | ICD-10-CM | POA: Diagnosis not present

## 2022-08-29 DIAGNOSIS — I471 Supraventricular tachycardia: Secondary | ICD-10-CM | POA: Diagnosis not present

## 2022-08-29 DIAGNOSIS — M25572 Pain in left ankle and joints of left foot: Secondary | ICD-10-CM | POA: Diagnosis not present

## 2022-08-29 DIAGNOSIS — Z01818 Encounter for other preprocedural examination: Secondary | ICD-10-CM | POA: Diagnosis not present

## 2022-08-29 DIAGNOSIS — G90A POTS (postural orthostatic tachycardia syndrome): Principal | ICD-10-CM

## 2022-08-29 DIAGNOSIS — G43119 Migraine with aura, intractable, without status migrainosus: Principal | ICD-10-CM

## 2022-08-29 DIAGNOSIS — D8940 Mast cell activation, unspecified: Principal | ICD-10-CM

## 2022-08-29 DIAGNOSIS — E063 Autoimmune thyroiditis: Principal | ICD-10-CM

## 2022-08-29 DIAGNOSIS — G8929 Other chronic pain: Principal | ICD-10-CM

## 2022-08-29 DIAGNOSIS — L4050 Arthropathic psoriasis, unspecified: Principal | ICD-10-CM

## 2022-08-29 DIAGNOSIS — Q796 Ehlers-Danlos syndrome, unspecified: Principal | ICD-10-CM

## 2022-08-29 DIAGNOSIS — J45909 Unspecified asthma, uncomplicated: Principal | ICD-10-CM

## 2022-08-29 DIAGNOSIS — F4321 Adjustment disorder with depressed mood: Principal | ICD-10-CM

## 2022-08-30 ENCOUNTER — Encounter: Payer: Medicare Other | Admitting: Surgical

## 2022-08-30 ENCOUNTER — Encounter: Payer: Medicare Other | Admitting: Plastic Surgery

## 2022-08-30 NOTE — H&P (Signed)
07/29/22 H and P by Evelena Leyden, PAC reviewed and no interval changes noted.

## 2022-08-31 MED ORDER — ERGOCALCIFEROL (VITAMIN D2) 1,250 MCG (50,000 UNIT) CAPSULE
ORAL_CAPSULE | ORAL | 0 refills | 56 days
Start: 2022-08-31 — End: 2022-10-30

## 2022-08-31 MED ORDER — ONDANSETRON 4 MG DISINTEGRATING TABLET
ORAL_TABLET | Freq: Three times a day (TID) | ORAL | 0 refills | 7 days | PRN
Start: 2022-08-31 — End: ?

## 2022-08-31 MED ORDER — HYDROCODONE 5 MG-ACETAMINOPHEN 325 MG TABLET
ORAL_TABLET | Freq: Four times a day (QID) | ORAL | 0 refills | 4 days | PRN
Start: 2022-08-31 — End: ?

## 2022-08-31 MED ORDER — ASPIRIN 325 MG TABLET
ORAL_TABLET | Freq: Every day | ORAL | 0 refills | 14 days
Start: 2022-08-31 — End: 2022-09-14

## 2022-08-31 MED ORDER — DOCUSATE SODIUM 100 MG CAPSULE
ORAL_CAPSULE | Freq: Two times a day (BID) | ORAL | 0 refills | 10 days
Start: 2022-08-31 — End: 2022-09-10

## 2022-09-03 ENCOUNTER — Encounter: Admit: 2022-09-03 | Discharge: 2022-09-03 | Payer: MEDICARE

## 2022-09-03 ENCOUNTER — Encounter
Admit: 2022-09-03 | Discharge: 2022-09-03 | Payer: MEDICARE | Attending: Student in an Organized Health Care Education/Training Program | Primary: Student in an Organized Health Care Education/Training Program

## 2022-09-03 ENCOUNTER — Ambulatory Visit: Admit: 2022-09-03 | Discharge: 2022-09-03 | Payer: MEDICARE

## 2022-09-03 DIAGNOSIS — L4050 Arthropathic psoriasis, unspecified: Secondary | ICD-10-CM | POA: Diagnosis not present

## 2022-09-03 DIAGNOSIS — Q796 Ehlers-Danlos syndrome, unspecified: Secondary | ICD-10-CM | POA: Diagnosis not present

## 2022-09-03 DIAGNOSIS — K219 Gastro-esophageal reflux disease without esophagitis: Secondary | ICD-10-CM | POA: Diagnosis not present

## 2022-09-03 DIAGNOSIS — E063 Autoimmune thyroiditis: Secondary | ICD-10-CM | POA: Diagnosis not present

## 2022-09-03 DIAGNOSIS — G8918 Other acute postprocedural pain: Secondary | ICD-10-CM | POA: Diagnosis not present

## 2022-09-03 DIAGNOSIS — Z79899 Other long term (current) drug therapy: Secondary | ICD-10-CM | POA: Diagnosis not present

## 2022-09-03 DIAGNOSIS — G901 Familial dysautonomia [Riley-Day]: Secondary | ICD-10-CM | POA: Diagnosis not present

## 2022-09-03 DIAGNOSIS — G90A Postural orthostatic tachycardia syndrome (POTS): Secondary | ICD-10-CM | POA: Diagnosis not present

## 2022-09-03 DIAGNOSIS — Z79890 Hormone replacement therapy (postmenopausal): Secondary | ICD-10-CM | POA: Diagnosis not present

## 2022-09-03 DIAGNOSIS — E785 Hyperlipidemia, unspecified: Secondary | ICD-10-CM | POA: Diagnosis not present

## 2022-09-03 DIAGNOSIS — G540 Brachial plexus disorders: Secondary | ICD-10-CM | POA: Diagnosis not present

## 2022-09-03 DIAGNOSIS — I498 Other specified cardiac arrhythmias: Secondary | ICD-10-CM | POA: Diagnosis not present

## 2022-09-03 DIAGNOSIS — M25372 Other instability, left ankle: Secondary | ICD-10-CM | POA: Diagnosis not present

## 2022-09-05 ENCOUNTER — Encounter: Payer: Medicare Other | Admitting: Student

## 2022-09-05 NOTE — Progress Notes (Deleted)
Patient is a 36 year old female who underwent bilateral breast reduction with Dr. Domenica Reamer on 08/20/2022.  Intraoperatively, 275 g were removed from the right breast and 269 g were removed from the left breast.  Patient presents to the clinic for postoperative follow-up.  She is a little over 2 weeks postop.  Today,

## 2022-09-17 MED ORDER — NURTEC ODT 75 MG DISINTEGRATING TABLET
ORAL_TABLET | ORAL | 3 refills | 30 days | Status: CP
Start: 2022-09-17 — End: ?

## 2022-09-18 ENCOUNTER — Ambulatory Visit (INDEPENDENT_AMBULATORY_CARE_PROVIDER_SITE_OTHER): Payer: Medicare Other | Admitting: Physician Assistant

## 2022-09-18 DIAGNOSIS — Z9889 Other specified postprocedural states: Secondary | ICD-10-CM

## 2022-09-18 NOTE — Progress Notes (Signed)
Patient is a pleasant 36 year old female with PMH of macromastia s/p bilateral breast reduction performed 08/20/2022 by Dr. Erin Hearing who presents to clinic for postoperative follow-up.  Reviewed operative report and 275 g removed from right side, 269 g removed from the left side.  No drains were utilized.  Today, patient is doing well, no complaints.  She feels pleased with the surgical outcome.  She reports a small wound at left inferior T-zone, but otherwise no significant wounds.  She states that it is not draining and she has been applying Vaseline.  Denies any severe pain, swelling, fevers, or other systemic symptoms.  Physical exam is entirely reassuring.  Breasts with excellent shape and symmetry.  Small, less than 1 cm wound left inferior T-zone.  There is also a punctate incisional opening involving medial aspect of right inframammary incision.  Neither are draining.  NAC's are viable.  No keloiding.  Possible INSORB staples palpable beneath the skin, but will not attempt to remove.  Picture(s) obtained of the patient and placed in the chart were with the patient's or guardian's permission.  Discussed compressive bra and activity modifications for another 2 weeks, but she is doing quite well and no specific follow-up needed.  She will certainly call the clinic should she have any questions or concerns.  Additionally, we discussed surgery to address her abdominal pannus.  She reports extreme weight loss from 220 pounds down to 175 pounds.  The weight loss, combined with her skin laxity from Ehlers-Danlos syndrome, contributes to a pannus that is bothersome and causes functional impairment.  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  Discussed panniculectomy versus abdominoplasty.  She will return in 1 month for formal consult with Dr. Erin Hearing to discuss her options.

## 2022-09-19 ENCOUNTER — Ambulatory Visit: Admit: 2022-09-19 | Discharge: 2022-09-20 | Payer: MEDICARE

## 2022-09-20 ENCOUNTER — Ambulatory Visit (INDEPENDENT_AMBULATORY_CARE_PROVIDER_SITE_OTHER): Payer: Medicare Other | Admitting: Obstetrics & Gynecology

## 2022-09-20 ENCOUNTER — Encounter: Payer: Self-pay | Admitting: Obstetrics & Gynecology

## 2022-09-20 VITALS — BP 126/86 | HR 115 | Ht 63.0 in

## 2022-09-20 DIAGNOSIS — M3505 Sjogren syndrome with inflammatory arthritis: Secondary | ICD-10-CM

## 2022-09-20 DIAGNOSIS — N952 Postmenopausal atrophic vaginitis: Secondary | ICD-10-CM

## 2022-09-20 MED ORDER — PREMARIN 0.625 MG/GM VA CREA: 1.0000 | TOPICAL_CREAM | Freq: Every day | VAGINAL | 3 refills | Status: DC

## 2022-09-20 MED ORDER — METHYLPHENIDATE HCL ER 10 MG PO TBCR: 10.0000 mg | EXTENDED_RELEASE_TABLET | Freq: Every morning | ORAL | 0 refills | Status: DC

## 2022-09-20 NOTE — Progress Notes (Signed)
Chief Complaint  Patient presents with   Vaginal Pain    And dryness.       36 y.o. [id number] Patient's last menstrual period was 10/23/2016 (approximate). The current method of family planning is status post hysterectomy.  Outpatient Encounter Medications as of 09/20/2022  Medication Sig   albuterol (PROVENTIL HFA;VENTOLIN HFA) 108 (90 Base) MCG/ACT inhaler Inhale 2 puffs into the lungs every 6 (six) hours as needed for wheezing or shortness of breath.    betamethasone dipropionate 0.05 % cream Apply 1 Application topically 2 (two) times daily as needed (irritation).   botulinum toxin Type A (BOTOX) 200 units injection Inject 200 Units as directed every 3 (three) months.   buPROPion (WELLBUTRIN XL) 300 MG 24 hr tablet Take 300 mg by mouth daily.   cholecalciferol (VITAMIN D3) 25 MCG (1000 UNIT) tablet Take 1,000 Units by mouth daily.   clobetasol cream (TEMOVATE) 0.05 % Apply 1 Application topically daily as needed (irritation).   conjugated estrogens (PREMARIN) vaginal cream Place 1 Applicatorful vaginally daily. Use 1 gram nightly   cromolyn (GASTROCROM) 100 MG/5ML solution Take 100 mg by mouth daily.   cycloSPORINE (RESTASIS) 0.05 % ophthalmic emulsion Place 1 drop into both eyes 2 (two) times daily.   desonide (DESOWEN) 0.05 % ointment Apply 1 application  topically 2 (two) times daily as needed (irritation).   levothyroxine (SYNTHROID) 75 MCG tablet Take 75 mcg by mouth daily before breakfast.    lidocaine (LIDODERM) 5 % Place 3 patches onto the skin daily as needed (pain).   metoCLOPramide (REGLAN) 5 MG tablet Take 5 mg by mouth every 8 (eight) hours as needed for vomiting or nausea.   Multiple Vitamin (MULTIVITAMIN WITH MINERALS) TABS tablet Take 1 tablet by mouth daily.   nitrofurantoin, macrocrystal-monohydrate, (MACROBID) 100 MG capsule Take 1 capsule by mouth once a day only after intercourse to prevent urinary tract infection. Maximum daily dose of 1 capsule.   nystatin  cream (MYCOSTATIN) Apply 1 Application topically 2 (two) times daily as needed for dry skin (yeast).   pregabalin (LYRICA) 100 MG capsule Take 100 mg by mouth 2 (two) times daily.   promethazine (PHENERGAN) 25 MG suppository Place 25 mg rectally every 8 (eight) hours as needed for vomiting or nausea.   Rimegepant Sulfate (NURTEC) 75 MG TBDP Take 75 mg by mouth every other day. ODT   [DISCONTINUED] methylphenidate 10 MG ER tablet Take 10 mg by mouth every morning.   Calcium-Vitamin D-Vitamin K (VIACTIV CALCIUM PLUS D) 650-12.5-40 MG-MCG-MCG CHEW Chew 1 each by mouth daily. (Patient not taking: Reported on 09/20/2022)   celecoxib (CELEBREX) 200 MG capsule Take 200 mg by mouth 2 (two) times daily. (Patient not taking: Reported on 09/20/2022)   methylphenidate 10 MG ER tablet Take 1 tablet (10 mg total) by mouth every morning.   No facility-administered encounter medications on file as of 09/20/2022.    Subjective Pt with quite a long time of increasing vaginal complaints Specifically feels irritated raw uncomfortable Sex is quite painful with insertion and vaginally even using lubrication She has episodic BV and yeast, mostly external yeast She has Sjogren's which has negative impact on vaginal tissue and MS She self caths about 3 times per day because of a neurogenic bladder  Pt denies any vasomotor symptoms Endocrinologist recently did menospause evaluation and no lab evidence of menopause Past Medical History:  Diagnosis Date   Allergic rhinitis    Anemia    Arthritis    Asthma  last used inhaler 1 month ago   Dysphagia    Dysrhythmia    SVT   Ehlers-Danlos syndrome    Female bladder prolapse    GERD (gastroesophageal reflux disease)    with pregnancy only   Hashimoto's thyroiditis    Lupus ([place])    Mast cell activation syndrome (HCC)    Migraines    Osteoarthritis    Pelvic pain in antepartum period in second trimester 01/17/2014   POTS (postural orthostatic tachycardia  syndrome)    Pregnant 08/01/2015   PSVT (paroxysmal supraventricular tachycardia) (HCC)    could not be confirmed with 2016 study   Rectocele    Thyroid disease    Urethral prolapse    Uterine prolapse    Vaginal Pap smear, abnormal     Past Surgical History:  Procedure Laterality Date   ABDOMINAL HYSTERECTOMY     ANTERIOR AND POSTERIOR VAGINAL REPAIR     BREAST REDUCTION SURGERY Bilateral 08/20/2022   Procedure: MAMMARY REDUCTION  (BREAST) with Liposuction;  Surgeon: Lennice Sites, MD;  Location: [place];  Service: Plastics;  Laterality: Bilateral;  3 hours   CYSTOCELE REPAIR N/A 09/17/2017   Procedure: ANTERIOR REPAIR (CYSTOCELE);  Surgeon: Florian Buff, MD;  Location: [facility];  Service: Gynecology;  Laterality: N/A;   KNEE SURGERY Left 11/2021   KNEE SURGERY Right 2023   LIPOSUCTION  03/2022   SUPRAVENTRICULAR TACHYCARDIA ABLATION  01/05/2015   unsuccessful   SUPRAVENTRICULAR TACHYCARDIA ABLATION N/A 01/05/2015   Procedure: SUPRAVENTRICULAR TACHYCARDIA ABLATION;  Surgeon: Evans Lance, MD;  Location[facility] C[facility];  Service: Cardiovascular;  Laterality: N/A;   VAGINAL HYSTERECTOMY N/A 09/17/2017   Procedure: HYSTERECTOMY VAGINAL WITH POSSIBLE BILATERAL SALPINGECTOMY;  Surgeon: Florian Buff, MD;  Location: [facility];  Service: Gynecology;  Laterality: N/A;   WRIST GANGLION EXCISION Left    x2    OB History     Gravida  6   Para  6   Term  6   Preterm      AB      Living  6      SAB      IAB      Ectopic      Multiple  0   Live Births  6           Allergies  Allergen Reactions   Cosentyx [Secukinumab] Anaphylaxis   Plaquenil [Hydroxychloroquine] Other (See Comments)    Prolonged QT   Latex Dermatitis   Tape     Use paper tape   Zofran [Ondansetron Hcl]     Prolonged QT    Social History   Socioeconomic History   Marital status: Married    Spouse name: Not on file   Number of children: 6   Years of education: HS   Highest education  level: Not on file  Occupational History   Occupation: Programme researcher, broadcasting/film/video  Tobacco Use   Smoking status: Never   Smokeless tobacco: Never  Vaping Use   Vaping Use: Never used  Substance and Sexual Activity   Alcohol use: No   Drug use: No   Sexual activity: Yes    Birth control/protection: Surgical    Comment: hyst  Other Topics Concern   Not on file  Social History Narrative   Lives with husband   Right handed    Caffeine use: 1-2 diet cokes per day   Social Determinants of Health   Financial Resource Strain: Not on file  Food Insecurity: Not  on file  Transportation Needs: Not on file  Physical Activity: Not on file  Stress: Not on file  Social Connections: Not on file    Family History  Problem Relation Age of Onset   Hypertension Mother    Hyperlipidemia Mother    Multiple sclerosis Mother    COPD Mother    Cancer Mother        melanoma x 2   Diabetes Mother    Heart disease Father        PSVT   Hyperlipidemia Brother    Hypertension Brother    Heart disease Maternal Grandfather        heart attack   Cancer Paternal Grandmother        pancreatic cancer   Heart disease Paternal Grandmother        MI   Hyperlipidemia Paternal Grandmother    Hypertension Paternal Grandmother    Asthma Daughter    Asthma Son    Autism Son    ADD / ADHD Son    Tourette syndrome Son    Other Son        Neurological and mental issues   ADD / ADHD Son     Medications:       Current Outpatient Medications:    albuterol (PROVENTIL HFA;VENTOLIN HFA) 108 (90 Base) MCG/ACT inhaler, Inhale 2 puffs into the lungs every 6 (six) hours as needed for wheezing or shortness of breath. , Disp: , Rfl:    betamethasone dipropionate 0.05 % cream, Apply 1 Application topically 2 (two) times daily as needed (irritation)., Disp: , Rfl:    botulinum toxin Type A (BOTOX) 200 units injection, Inject 200 Units as directed every 3 (three) months., Disp: , Rfl:    buPROPion  (WELLBUTRIN XL) 300 MG 24 hr tablet, Take 300 mg by mouth daily., Disp: , Rfl:    cholecalciferol (VITAMIN D3) 25 MCG (1000 UNIT) tablet, Take 1,000 Units by mouth daily., Disp: , Rfl:    clobetasol cream (TEMOVATE) 0.05 %, Apply 1 Application topically daily as needed (irritation)., Disp: , Rfl:    conjugated estrogens (PREMARIN) vaginal cream, Place 1 Applicatorful vaginally daily. Use 1 gram nightly, Disp: 90 g, Rfl: 3   cromolyn (GASTROCROM) 100 MG/5ML solution, Take 100 mg by mouth daily., Disp: , Rfl:    cycloSPORINE (RESTASIS) 0.05 % ophthalmic emulsion, Place 1 drop into both eyes 2 (two) times daily., Disp: , Rfl:    desonide (DESOWEN) 0.05 % ointment, Apply 1 application  topically 2 (two) times daily as needed (irritation)., Disp: , Rfl:    levothyroxine (SYNTHROID) 75 MCG tablet, Take 75 mcg by mouth daily before breakfast. , Disp: , Rfl:    lidocaine (LIDODERM) 5 %, Place 3 patches onto the skin daily as needed (pain)., Disp: , Rfl:    metoCLOPramide (REGLAN) 5 MG tablet, Take 5 mg by mouth every 8 (eight) hours as needed for vomiting or nausea., Disp: , Rfl:    Multiple Vitamin (MULTIVITAMIN WITH MINERALS) TABS tablet, Take 1 tablet by mouth daily., Disp: , Rfl:    nitrofurantoin, macrocrystal-monohydrate, (MACROBID) 100 MG capsule, Take 1 capsule by mouth once a day only after intercourse to prevent urinary tract infection. Maximum daily dose of 1 capsule., Disp: 30 capsule, Rfl: 11   nystatin cream (MYCOSTATIN), Apply 1 Application topically 2 (two) times daily as needed for dry skin (yeast)., Disp: , Rfl:    pregabalin (LYRICA) 100 MG capsule, Take 100 mg by mouth 2 (two) times daily., Disp: ,  Rfl:    promethazine (PHENERGAN) 25 MG suppository, Place 25 mg rectally every 8 (eight) hours as needed for vomiting or nausea., Disp: , Rfl:    Rimegepant Sulfate (NURTEC) 75 MG TBDP, Take 75 mg by mouth every other day. ODT, Disp: , Rfl:    Calcium-Vitamin D-Vitamin K (VIACTIV CALCIUM PLUS  D) 650-12.5-40 MG-MCG-MCG CHEW, Chew 1 each by mouth daily. (Patient not taking: Reported on 09/20/2022), Disp: , Rfl:    celecoxib (CELEBREX) 200 MG capsule, Take 200 mg by mouth 2 (two) times daily. (Patient not taking: Reported on 09/20/2022), Disp: , Rfl:    methylphenidate 10 MG ER tablet, Take 1 tablet (10 mg total) by mouth every morning., Disp: 30 tablet, Rfl: 0  Objective Blood pressure 126/86, pulse (!) 115, height 5\' 3"  (1.6 m), last menstrual period 10/23/2016.  General WDWN female NAD Vulva:  normal appearing vulva with no masses, tenderness or lesions Vagina:  atrophic in appearance with poor reduncancy, tender with exam, no discharge    Pertinent ROS No burning with urination, frequency or urgency No nausea, vomiting or diarrhea Nor fever chills or other constitutional symptoms   Labs or studies Reviewed recent available studies    Impression + Management Plan: Diagnoses this Encounter::   ICD-10-CM   1. Atrophic vaginal changes, related to Sjogren's  N95.2    trial of vaginal cream, 1 gram nightly    2. Sjogren's syndrome   M35.05         Medications prescribed during  this encounter: Meds ordered this encounter  Medications   conjugated estrogens (PREMARIN) vaginal cream    Sig: Place 1 Applicatorful vaginally daily. Use 1 gram nightly    Dispense:  90 g    Refill:  3   methylphenidate 10 MG ER tablet    Sig: Take 1 tablet (10 mg total) by mouth every morning.    Dispense:  30 tablet    Refill:  0    Labs or Scans Ordered during this encounter: No orders of the defined types were placed in this encounter.     Follow up Return in about 1 month (around 10/20/2022) for Follow up, with Dr 10/22/2022.

## 2022-09-24 ENCOUNTER — Encounter: Payer: Self-pay | Admitting: Obstetrics & Gynecology

## 2022-09-26 ENCOUNTER — Ambulatory Visit (INDEPENDENT_AMBULATORY_CARE_PROVIDER_SITE_OTHER): Payer: Medicare Other | Admitting: Physician Assistant

## 2022-09-26 ENCOUNTER — Encounter: Payer: Self-pay | Admitting: Physician Assistant

## 2022-09-26 ENCOUNTER — Telehealth: Payer: Self-pay | Admitting: Plastic Surgery

## 2022-09-26 VITALS — BP 117/81 | HR 101 | Temp 98.2°F

## 2022-09-26 DIAGNOSIS — Z9889 Other specified postprocedural states: Secondary | ICD-10-CM

## 2022-09-26 NOTE — Telephone Encounter (Signed)
LVM and My Chart message to contact office to change appt to a different provider.

## 2022-09-26 NOTE — Progress Notes (Signed)
Patient is a pleasant 36 year old female with PMH of macromastia s/p bilateral breast reduction performed 08/20/2022 by Dr. Erin Hearing who presents to clinic for postoperative follow-up.  She was last seen here in clinic on 09/18/2022 for her initial postoperative encounter.  At that time, small, less than 1 cm wound was noted at left inferior T-zone.  There is also a punctate incisional opening involving medial aspect of right inframammary incision.  Other than that, exam was entirely reassuring.  Pictures were placed in chart.  She then was admitted to the hospital for urosepsis in [place], discharged 09/25/2022.  At that time, she also reports development of breast cellulitis with possible underlying abscess.  Today, patient reports that her redness and discomfort has improved considerably since discharge.  She was sent home with doxycycline and another antibiotic that she cannot recall.  She is accompanied by her husband at bedside.  She still feels fatigued, but reports that her fevers have been controlled since discharge.  Vital signs obtained here today are reassuring.  Heart rate minimally elevated at 101, but consistent with her baseline compared to prior encounters that were each > 100.  She tells me that the hospitalist told her that she had a breast abscess, but she does not have the imaging report and the status is unclear.  On exam, breasts with excellent shape and symmetry.  She does have very minimal erythema over inferior aspect of right breast, but no significant change from contralateral side and the skin does not feel indurated.  Breasts are soft.  She does have a 2 x 2 centimeter area of firmness with no overlying skin changes over most lateral aspect of right breast.  No fluctuance or erythema.  Feels more consistent with fat necrosis than seroma or abscess.  Mildly tender to palpation.  Given her improvement, recommend continued p.o. antibiotics.  Her vital signs are all reassuring and  her exam is not consistent with an abscess that is amenable to aspiration or an I&D here in the clinic.  Follow-up in 10 days.  Picture(s) obtained of the patient and placed in the chart were with the patient's or guardian's permission.

## 2022-10-07 ENCOUNTER — Ambulatory Visit: Payer: Medicare Other | Admitting: Physician Assistant

## 2022-10-17 ENCOUNTER — Encounter: Payer: Self-pay | Admitting: Obstetrics & Gynecology

## 2022-10-17 ENCOUNTER — Ambulatory Visit (INDEPENDENT_AMBULATORY_CARE_PROVIDER_SITE_OTHER): Payer: Medicare Other | Admitting: Obstetrics & Gynecology

## 2022-10-17 VITALS — BP 125/79 | HR 83 | Ht 63.0 in

## 2022-10-17 DIAGNOSIS — E038 Other specified hypothyroidism: Secondary | ICD-10-CM

## 2022-10-17 DIAGNOSIS — Z09 Encounter for follow-up examination after completed treatment for conditions other than malignant neoplasm: Secondary | ICD-10-CM | POA: Diagnosis not present

## 2022-10-17 DIAGNOSIS — Z87440 Personal history of urinary (tract) infections: Secondary | ICD-10-CM | POA: Diagnosis not present

## 2022-10-17 DIAGNOSIS — Z8619 Personal history of other infectious and parasitic diseases: Secondary | ICD-10-CM

## 2022-10-17 DIAGNOSIS — E063 Autoimmune thyroiditis: Secondary | ICD-10-CM

## 2022-10-17 DIAGNOSIS — R8290 Unspecified abnormal findings in urine: Secondary | ICD-10-CM

## 2022-10-17 NOTE — Progress Notes (Signed)
Chief Complaint  Patient presents with   Follow-up      36 y.o. [id number] Patient's last menstrual period was 10/23/2016 (approximate). The current method of family planning is status post hysterectomy.  Outpatient Encounter Medications as of 10/17/2022  Medication Sig   albuterol (PROVENTIL HFA;VENTOLIN HFA) 108 (90 Base) MCG/ACT inhaler Inhale 2 puffs into the lungs every 6 (six) hours as needed for wheezing or shortness of breath.    betamethasone dipropionate 0.05 % cream Apply 1 Application topically 2 (two) times daily as needed (irritation).   botulinum toxin Type A (BOTOX) 200 units injection Inject 200 Units as directed every 3 (three) months.   buPROPion (WELLBUTRIN XL) 300 MG 24 hr tablet Take 300 mg by mouth daily.   Calcium-Vitamin D-Vitamin K (VIACTIV CALCIUM PLUS D) 650-12.5-40 MG-MCG-MCG CHEW Chew 1 each by mouth daily.   cholecalciferol (VITAMIN D3) 25 MCG (1000 UNIT) tablet Take 1,000 Units by mouth daily.   clobetasol cream (TEMOVATE) 8.03 % Apply 1 Application topically daily as needed (irritation).   conjugated estrogens (PREMARIN) vaginal cream Place 1 Applicatorful vaginally daily. Use 1 gram nightly   cromolyn (GASTROCROM) 100 MG/5ML solution Take 100 mg by mouth daily.   cycloSPORINE (RESTASIS) 0.05 % ophthalmic emulsion Place 1 drop into both eyes 2 (two) times daily.   desonide (DESOWEN) 0.05 % ointment Apply 1 application  topically 2 (two) times daily as needed (irritation).   levothyroxine (SYNTHROID) 75 MCG tablet Take 75 mcg by mouth daily before breakfast.    lidocaine (LIDODERM) 5 % Place 3 patches onto the skin daily as needed (pain).   methylphenidate 10 MG ER tablet Take 1 tablet (10 mg total) by mouth every morning.   metoCLOPramide (REGLAN) 5 MG tablet Take 5 mg by mouth every 8 (eight) hours as needed for vomiting or nausea.   Multiple Vitamin (MULTIVITAMIN WITH MINERALS) TABS tablet Take 1 tablet by mouth daily.   nitrofurantoin,  macrocrystal-monohydrate, (MACROBID) 100 MG capsule Take 1 capsule by mouth once a day only after intercourse to prevent urinary tract infection. Maximum daily dose of 1 capsule.   nystatin cream (MYCOSTATIN) Apply 1 Application topically 2 (two) times daily as needed for dry skin (yeast).   pregabalin (LYRICA) 100 MG capsule Take 100 mg by mouth 2 (two) times daily.   promethazine (PHENERGAN) 25 MG suppository Place 25 mg rectally every 8 (eight) hours as needed for vomiting or nausea.   Rimegepant Sulfate (NURTEC) 75 MG TBDP Take 75 mg by mouth every other day. ODT   No facility-administered encounter medications on file as of 10/17/2022.    Subjective Pt was admitted in [place] for E coli sepsis Urinalysis was clear No UTI symptoms today Urine is clear today Will culture Will check CBC WBC with ESR, and TFTs Past Medical History:  Diagnosis Date   Allergic rhinitis    Anemia    Arthritis    Asthma    last used inhaler 1 month ago   Dysphagia    Dysrhythmia    SVT   Ehlers-Danlos syndrome    Female bladder prolapse    GERD (gastroesophageal reflux disease)    with pregnancy only   Hashimoto's thyroiditis    Lupus ([place])    Mast cell activation syndrome (HCC)    Migraines    Osteoarthritis    Pelvic pain in antepartum period in second trimester 01/17/2014   POTS (postural orthostatic tachycardia syndrome)    Pregnant 08/01/2015   PSVT (paroxysmal supraventricular  tachycardia)    could not be confirmed with 2016 study   Rectocele    Thyroid disease    Urethral prolapse    Uterine prolapse    Vaginal Pap smear, abnormal     Past Surgical History:  Procedure Laterality Date   ABDOMINAL HYSTERECTOMY     ANTERIOR AND POSTERIOR VAGINAL REPAIR     BREAST REDUCTION SURGERY Bilateral 08/20/2022   Procedure: MAMMARY REDUCTION  (BREAST) with Liposuction;  Surgeon: Lennice Sites, MD;  Location: [place];  Service: Plastics;  Laterality: Bilateral;  3 hours   CYSTOCELE REPAIR  N/A 09/17/2017   Procedure: ANTERIOR REPAIR (CYSTOCELE);  Surgeon: Florian Buff, MD;  Location: [facility];  Service: Gynecology;  Laterality: N/A;   KNEE SURGERY Left 11/2021   KNEE SURGERY Right 2023   LIPOSUCTION  03/2022   SUPRAVENTRICULAR TACHYCARDIA ABLATION  01/05/2015   unsuccessful   SUPRAVENTRICULAR TACHYCARDIA ABLATION N/A 01/05/2015   Procedure: SUPRAVENTRICULAR TACHYCARDIA ABLATION;  Surgeon: Evans Lance, MD;  Location[facility];  Service: Cardiovascular;  Laterality: N/A;   VAGINAL HYSTERECTOMY N/A 09/17/2017   Procedure: HYSTERECTOMY VAGINAL WITH POSSIBLE BILATERAL SALPINGECTOMY;  Surgeon: Florian Buff, MD;  Location: [facility];  Service: Gynecology;  Laterality: N/A;   WRIST GANGLION EXCISION Left    x2    OB History     Gravida  6   Para  6   Term  6   Preterm      AB      Living  6      SAB      IAB      Ectopic      Multiple  0   Live Births  6           Allergies  Allergen Reactions   Cosentyx [Secukinumab] Anaphylaxis   Plaquenil [Hydroxychloroquine] Other (See Comments)    Prolonged QT   Latex Dermatitis   Tape     Use paper tape   Zofran [Ondansetron Hcl]     Prolonged QT    Social History   Socioeconomic History   Marital status: Married    Spouse name: Not on file   Number of children: 6   Years of education: HS   Highest education level: Not on file  Occupational History   Occupation: Programme researcher, broadcasting/film/video  Tobacco Use   Smoking status: Never   Smokeless tobacco: Never  Vaping Use   Vaping Use: Never used  Substance and Sexual Activity   Alcohol use: No   Drug use: No   Sexual activity: Yes    Birth control/protection: Surgical    Comment: hyst  Other Topics Concern   Not on file  Social History Narrative   Lives with husband   Right handed    Caffeine use: 1-2 diet cokes per day   Social Determinants of Health   Financial Resource Strain: Not on file  Food Insecurity: Not on file   Transportation Needs: Not on file  Physical Activity: Not on file  Stress: Not on file  Social Connections: Not on file    Family History  Problem Relation Age of Onset   Hypertension Mother    Hyperlipidemia Mother    Multiple sclerosis Mother    COPD Mother    Cancer Mother        melanoma x 2   Diabetes Mother    Heart disease Father        PSVT   Hyperlipidemia Brother  Hypertension Brother    Heart disease Maternal Grandfather        heart attack   Cancer Paternal Grandmother        pancreatic cancer   Heart disease Paternal Grandmother        MI   Hyperlipidemia Paternal Grandmother    Hypertension Paternal Grandmother    Asthma Daughter    Asthma Son    Autism Son    ADD / ADHD Son    Tourette syndrome Son    Other Son        Neurological and mental issues   ADD / ADHD Son     Medications:       Current Outpatient Medications:    albuterol (PROVENTIL HFA;VENTOLIN HFA) 108 (90 Base) MCG/ACT inhaler, Inhale 2 puffs into the lungs every 6 (six) hours as needed for wheezing or shortness of breath. , Disp: , Rfl:    betamethasone dipropionate 0.05 % cream, Apply 1 Application topically 2 (two) times daily as needed (irritation)., Disp: , Rfl:    botulinum toxin Type A (BOTOX) 200 units injection, Inject 200 Units as directed every 3 (three) months., Disp: , Rfl:    buPROPion (WELLBUTRIN XL) 300 MG 24 hr tablet, Take 300 mg by mouth daily., Disp: , Rfl:    Calcium-Vitamin D-Vitamin K (VIACTIV CALCIUM PLUS D) 650-12.5-40 MG-MCG-MCG CHEW, Chew 1 each by mouth daily., Disp: , Rfl:    cholecalciferol (VITAMIN D3) 25 MCG (1000 UNIT) tablet, Take 1,000 Units by mouth daily., Disp: , Rfl:    clobetasol cream (TEMOVATE) 3.84 %, Apply 1 Application topically daily as needed (irritation)., Disp: , Rfl:    conjugated estrogens (PREMARIN) vaginal cream, Place 1 Applicatorful vaginally daily. Use 1 gram nightly, Disp: 90 g, Rfl: 3   cromolyn (GASTROCROM) 100 MG/5ML solution,  Take 100 mg by mouth daily., Disp: , Rfl:    cycloSPORINE (RESTASIS) 0.05 % ophthalmic emulsion, Place 1 drop into both eyes 2 (two) times daily., Disp: , Rfl:    desonide (DESOWEN) 0.05 % ointment, Apply 1 application  topically 2 (two) times daily as needed (irritation)., Disp: , Rfl:    levothyroxine (SYNTHROID) 75 MCG tablet, Take 75 mcg by mouth daily before breakfast. , Disp: , Rfl:    lidocaine (LIDODERM) 5 %, Place 3 patches onto the skin daily as needed (pain)., Disp: , Rfl:    methylphenidate 10 MG ER tablet, Take 1 tablet (10 mg total) by mouth every morning., Disp: 30 tablet, Rfl: 0   metoCLOPramide (REGLAN) 5 MG tablet, Take 5 mg by mouth every 8 (eight) hours as needed for vomiting or nausea., Disp: , Rfl:    Multiple Vitamin (MULTIVITAMIN WITH MINERALS) TABS tablet, Take 1 tablet by mouth daily., Disp: , Rfl:    nitrofurantoin, macrocrystal-monohydrate, (MACROBID) 100 MG capsule, Take 1 capsule by mouth once a day only after intercourse to prevent urinary tract infection. Maximum daily dose of 1 capsule., Disp: 30 capsule, Rfl: 11   nystatin cream (MYCOSTATIN), Apply 1 Application topically 2 (two) times daily as needed for dry skin (yeast)., Disp: , Rfl:    pregabalin (LYRICA) 100 MG capsule, Take 100 mg by mouth 2 (two) times daily., Disp: , Rfl:    promethazine (PHENERGAN) 25 MG suppository, Place 25 mg rectally every 8 (eight) hours as needed for vomiting or nausea., Disp: , Rfl:    Rimegepant Sulfate (NURTEC) 75 MG TBDP, Take 75 mg by mouth every other day. ODT, Disp: , Rfl:   Objective Blood  pressure 125/79, pulse 83, height _0  (1.6 m), last menstrual period 10/23/2016.  Gen emotional today NAD  Pertinent ROS No burning with urination, frequency or urgency No nausea, vomiting or diarrhea Nor fever chills or other constitutional symptoms   Labs or studies Urine is negative    Impression + Management Plan: Diagnoses this Encounter::   ICD-10-CM   1. History of  gram negative sepsis  Z86.19 CBC w/Diff    Sedimentation rate    2. Hypothyroidism due to Hashimoto's thyroiditis  E03.8 Thyroid Function Panel (THS+T3+T4+Free)   E06.3         Medications prescribed during  this encounter: No orders of the defined types were placed in this encounter.   Labs or Scans Ordered during this encounter: Orders Placed This Encounter  Procedures   CBC w/Diff   Thyroid Function Panel (THS+T3+T4+Free)   Sedimentation rate      Follow up Return if symptoms worsen or fail to improve.

## 2022-10-18 ENCOUNTER — Ambulatory Visit: Admit: 2022-10-18 | Discharge: 2022-10-19 | Payer: MEDICARE

## 2022-10-18 DIAGNOSIS — G43119 Migraine with aura, intractable, without status migrainosus: Principal | ICD-10-CM

## 2022-10-19 LAB — URINE CULTURE: Organism ID, Bacteria: NO GROWTH

## 2022-10-21 ENCOUNTER — Encounter: Payer: Self-pay | Admitting: Physician Assistant

## 2022-10-21 ENCOUNTER — Encounter: Payer: Self-pay | Admitting: *Deleted

## 2022-10-21 ENCOUNTER — Ambulatory Visit (INDEPENDENT_AMBULATORY_CARE_PROVIDER_SITE_OTHER): Payer: Medicare Other | Admitting: Physician Assistant

## 2022-10-21 DIAGNOSIS — Z9889 Other specified postprocedural states: Secondary | ICD-10-CM

## 2022-10-21 NOTE — Progress Notes (Signed)
Patient is a pleasant 36 year old female with PMH of macromastia s/p bilateral breast reduction performed 08/20/2022 by Dr. Erin Hearing who presents to clinic for postoperative follow-up.   She was last seen here in clinic on 09/26/2022.  At that time, she had just been discharged from the hospital after being admitted for urosepsis.  They had also found her to have a fluid collection concerning for abscess on right breast.  However, she felt as though it had improved and on exam there was no concern for abscess.  There was a 2 x 2 centimeter area of firmness with no overlying skin changes however over the most lateral aspect of right breast, consistent with fat necrosis.  Today, patient reports that she is completely improved.  She denies any residual pain, redness, swelling, fevers, or other symptoms.  She even feels as though the small area of firmness that was on lateral aspect of breast has softened considerably and now hardly noticeable.  She is overall quite pleased with her cosmetic outcome.  Physical exam is entirely reassuring.  Breasts with excellent shape and symmetry.  NAC's are viable.  No redness or other overlying skin changes.  Breasts are soft, no discrete areas of firmness.  The small, 2 x 2 cm area of firmness appreciated on last exam has largely resolved.  Incisions all appear to be well-healed.  She would like to return to clinic to discuss panniculectomy with Dr. Lovena Le.  She reports that this visit is already scheduled.  She is otherwise cleared from a postoperative standpoint regarding her breast reduction.  Pictures were obtained at last visit.

## 2022-10-22 ENCOUNTER — Ambulatory Visit: Payer: Medicare Other | Admitting: Obstetrics & Gynecology

## 2022-10-23 ENCOUNTER — Ambulatory Visit
Admit: 2022-10-23 | Discharge: 2022-10-24 | Payer: MEDICARE | Attending: Plastic and Reconstructive Surgery | Primary: Plastic and Reconstructive Surgery

## 2022-10-23 MED ORDER — DICLOFENAC SODIUM 50 MG PO TBEC
100.0000 mg | DELAYED_RELEASE_TABLET | Freq: Two times a day (BID) | ORAL | 3 refills | Status: DC
Start: 2022-10-23 — End: 2023-09-05

## 2022-10-23 MED ORDER — PREGABALIN 100 MG PO CAPS: 100.0000 mg | ORAL_CAPSULE | Freq: Two times a day (BID) | ORAL | 3 refills | Status: AC

## 2022-10-23 MED ORDER — BUPROPION HCL ER (XL) 300 MG PO TB24: 300.0000 mg | ORAL_TABLET | Freq: Every day | ORAL | 3 refills | Status: DC

## 2022-10-23 NOTE — Addendum Note (Signed)
Addended by: Florian Buff on: 10/23/2022 10:20 AM   Modules accepted: Orders

## 2022-10-24 ENCOUNTER — Ambulatory Visit: Admit: 2022-10-24 | Discharge: 2022-10-24 | Payer: MEDICARE

## 2022-10-24 ENCOUNTER — Ambulatory Visit: Admit: 2022-10-24 | Discharge: 2022-10-24 | Payer: MEDICARE | Attending: Specialist | Primary: Specialist

## 2022-10-24 DIAGNOSIS — G540 Brachial plexus disorders: Principal | ICD-10-CM

## 2022-10-25 DIAGNOSIS — G540 Brachial plexus disorders: Principal | ICD-10-CM

## 2022-10-25 LAB — CBC WITH DIFFERENTIAL/PLATELET
Basophils Absolute: 0.1 10*3/uL (ref 0.0–0.2)
Basos: 1 %
EOS (ABSOLUTE): 0.1 10*3/uL (ref 0.0–0.4)
Eos: 2 %
Hematocrit: 35.3 % (ref 34.0–46.6)
Hemoglobin: 11.7 g/dL (ref 11.1–15.9)
Immature Grans (Abs): 0 10*3/uL (ref 0.0–0.1)
Immature Granulocytes: 0 %
Lymphocytes Absolute: 2.5 10*3/uL (ref 0.7–3.1)
Lymphs: 37 %
MCH: 26.7 pg (ref 26.6–33.0)
MCHC: 33.1 g/dL (ref 31.5–35.7)
MCV: 81 fL (ref 79–97)
Monocytes Absolute: 0.7 10*3/uL (ref 0.1–0.9)
Monocytes: 11 %
Neutrophils Absolute: 3.3 10*3/uL (ref 1.4–7.0)
Neutrophils: 49 %
Platelets: 306 10*3/uL (ref 150–450)
RBC: 4.38 x10E6/uL (ref 3.77–5.28)
RDW: 13.9 % (ref 11.7–15.4)
WBC: 6.7 10*3/uL (ref 3.4–10.8)

## 2022-10-25 LAB — TSH+T3+FREE T4+T3 FREE
Free T-3: 2.7 pg/mL
Free T4 by Dialysis: 1.3 ng/dL
TSH: 2.5 uU/mL
Triiodothyronine (T-3), Serum: 147 ng/dL

## 2022-10-25 LAB — SEDIMENTATION RATE: Sed Rate: 6 mm/hr (ref 0–32)

## 2022-10-31 ENCOUNTER — Institutional Professional Consult (permissible substitution): Payer: Medicare Other | Admitting: Plastic Surgery

## 2022-11-02 ENCOUNTER — Other Ambulatory Visit: Payer: Self-pay | Admitting: Obstetrics & Gynecology

## 2022-11-03 MED ORDER — NITROFURANTOIN MONOHYD MACRO 100 MG PO CAPS: 100.0000 mg | ORAL_CAPSULE | Freq: Every day | ORAL | 11 refills | Status: DC

## 2022-11-03 NOTE — Addendum Note (Signed)
Addended by: Lazaro Arms on: 11/03/2022 09:27 PM   Modules accepted: Orders

## 2022-11-27 ENCOUNTER — Encounter: Payer: Self-pay | Admitting: Plastic Surgery

## 2022-11-27 ENCOUNTER — Ambulatory Visit (INDEPENDENT_AMBULATORY_CARE_PROVIDER_SITE_OTHER): Payer: Medicare Other | Admitting: Plastic Surgery

## 2022-11-27 VITALS — BP 127/84 | HR 113 | Ht 63.0 in | Wt 182.2 lb

## 2022-11-27 DIAGNOSIS — Z736 Limitation of activities due to disability: Secondary | ICD-10-CM | POA: Diagnosis not present

## 2022-11-27 DIAGNOSIS — E65 Localized adiposity: Secondary | ICD-10-CM

## 2022-11-27 NOTE — Progress Notes (Signed)
Referring Provider [facility] [address] [place],  [place] [address]   CC:  Chief Complaint  Patient presents with   Advice Only      Sharon Rowland is an 36 y.o. female.  HPI: Sharon Rowland is a 36 year old female who is well-known to the clinic.  Previously undergone bilateral breast reduction.  Returns today for discussion of a panniculectomy.  She is requesting removal of her pannus because it interferes with her ability to perform self-catheterization.  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  Further she states that she has had urosepsis in the past because of contaminating her catheter because she must hold her pannus out of the way with 1 hand and manipulate the catheter with 1 hand a procedure that usually takes to hands.  Allergies  Allergen Reactions   Cosentyx [Secukinumab] Anaphylaxis   Plaquenil [Hydroxychloroquine] Other (See Comments)    Prolonged QT   Latex Dermatitis   Tape     Use paper tape   Zofran [Ondansetron Hcl]     Prolonged QT    Outpatient Encounter Medications as of 11/27/2022  Medication Sig   albuterol (PROVENTIL HFA;VENTOLIN HFA) 108 (90 Base) MCG/ACT inhaler Inhale 2 puffs into the lungs every 6 (six) hours as needed for wheezing or shortness of breath.    betamethasone dipropionate 0.05 % cream Apply 1 Application topically 2 (two) times daily as needed (irritation).   botulinum toxin Type A (BOTOX) 200 units injection Inject 200 Units as directed every 3 (three) months.   buPROPion (WELLBUTRIN XL) 300 MG 24 hr tablet Take 1 tablet (300 mg total) by mouth daily.   Calcium-Vitamin D-Vitamin K (VIACTIV CALCIUM PLUS D) 650-12.5-40 MG-MCG-MCG CHEW Chew 1 each by mouth daily.   cholecalciferol (VITAMIN D3) 25 MCG (1000 UNIT) tablet Take 1,000 Units by mouth daily.   clobetasol cream (TEMOVATE) 0.05 % Apply 1 Application topically daily as needed (irritation).    conjugated estrogens (PREMARIN) vaginal cream Place 1 Applicatorful vaginally daily. Use 1 gram nightly   cromolyn (GASTROCROM) 100 MG/5ML solution Take 100 mg by mouth daily.   cycloSPORINE (RESTASIS) 0.05 % ophthalmic emulsion Place 1 drop into both eyes 2 (two) times daily.   desonide (DESOWEN) 0.05 % ointment Apply 1 application  topically 2 (two) times daily as needed (irritation).   diclofenac (VOLTAREN) 50 MG EC tablet Take 2 tablets (100 mg total) by mouth 2 (two) times daily.   levothyroxine (SYNTHROID) 75 MCG tablet Take 75 mcg by mouth daily before breakfast.    lidocaine (LIDODERM) 5 % Place 3 patches onto the skin daily as needed (pain).   methylphenidate 10 MG ER tablet TAKE ONE TABLET BY MOUTH EVERY MORNING   metoCLOPramide (REGLAN) 5 MG tablet Take 5 mg by mouth every 8 (eight) hours as needed for vomiting or nausea.   Multiple Vitamin (MULTIVITAMIN WITH MINERALS) TABS tablet Take 1 tablet by mouth daily.   nitrofurantoin, macrocrystal-monohydrate, (MACROBID) 100 MG capsule Take 1 capsule (100 mg total) by mouth at bedtime.   nystatin cream (MYCOSTATIN) Apply 1 Application topically 2 (two) times daily as needed for dry skin (yeast).   pregabalin (LYRICA) 100 MG capsule Take 1 capsule (100 mg total) by mouth 2 (two) times daily.   promethazine (PHENERGAN) 25 MG suppository Place 25 mg rectally every 8 (eight) hours as needed for vomiting or nausea.   Rimegepant Sulfate (NURTEC) 75 MG TBDP Take 75 mg by  mouth every other day. ODT   No facility-administered encounter medications on file as of 11/27/2022.     Past Medical History:  Diagnosis Date   Allergic rhinitis    Anemia    Arthritis    Asthma    last used inhaler 1 month ago   Dysphagia    Dysrhythmia    SVT   Ehlers-Danlos syndrome    Female bladder prolapse    GERD (gastroesophageal reflux disease)    with pregnancy only   Hashimoto's thyroiditis    Lupus (HCC)    Mast cell activation syndrome (HCC)     Migraines    Osteoarthritis    Pelvic pain in antepartum period in second trimester 01/17/2014   POTS (postural orthostatic tachycardia syndrome)    Pregnant 08/01/2015   PSVT (paroxysmal supraventricular tachycardia)    could not be confirmed with 2016 study   Rectocele    Thyroid disease    Urethral prolapse    Uterine prolapse    Vaginal Pap smear, abnormal     Past Surgical History:  Procedure Laterality Date   ABDOMINAL HYSTERECTOMY     ANTERIOR AND POSTERIOR VAGINAL REPAIR     BREAST REDUCTION SURGERY Bilateral 08/20/2022   Procedure: MAMMARY REDUCTION  (BREAST) with Liposuction;  Surgeon: Janne Napoleon, MD;  Location: [facility] OR;  Service: Plastics;  Laterality: Bilateral;  3 hours   CYSTOCELE REPAIR N/A 09/17/2017   Procedure: ANTERIOR REPAIR (CYSTOCELE);  Surgeon: Lazaro Arms, MD;  Location: [facility]S;  Service: Gynecology;  Laterality: N/A;   KNEE SURGERY Left 11/2021   KNEE SURGERY Right 2023   LIPOSUCTION  03/2022   SUPRAVENTRICULAR TACHYCARDIA ABLATION  01/05/2015   unsuccessful   SUPRAVENTRICULAR TACHYCARDIA ABLATION N/A 01/05/2015   Procedure: SUPRAVENTRICULAR TACHYCARDIA ABLATION;  Surgeon: Marinus Maw, MD;  Location: [facility] CATH LAB;  Service: Cardiovascular;  Laterality: N/A;   VAGINAL HYSTERECTOMY N/A 09/17/2017   Procedure: HYSTERECTOMY VAGINAL WITH POSSIBLE BILATERAL SALPINGECTOMY;  Surgeon: Lazaro Arms, MD;  Location: [facility];  Service: Gynecology;  Laterality: N/A;   WRIST GANGLION EXCISION Left    x2    Family History  Problem Relation Age of Onset   Hypertension Mother    Hyperlipidemia Mother    Multiple sclerosis Mother    COPD Mother    Cancer Mother        melanoma x 2   Diabetes Mother    Heart disease Father        PSVT   Hyperlipidemia Brother    Hypertension Brother    Heart disease Maternal Grandfather        heart attack   Cancer Paternal Grandmother        pancreatic cancer   Heart disease Paternal Grandmother        MI    Hyperlipidemia Paternal Grandmother    Hypertension Paternal Grandmother    Asthma Daughter    Asthma Son    Autism Son    ADD / ADHD Son    Tourette syndrome Son    Other Son        Neurological and mental issues   ADD / ADHD Son     Social History   Social History Narrative   Lives with husband   Right handed    Caffeine use: 1-2 diet cokes per day     Review of Systems General: Denies fevers, chills, weight loss CV: Denies chest pain, shortness of breath, palpitations Urogenital: Patient states she has a neurogenic bladder.  She self caths daily. Skin: Has a small pannus which does not hang down past the symphysis pubis.  She states that this interferes with her ability to maintain her urogenital health through self-catheterization.  Physical Exam    11/27/2022    9:44 AM 10/17/2022   10:05 AM 09/26/2022    9:45 AM  Vitals with BMI  Height 5\' 3"  5\' 3"    Weight 182 lbs 3 oz    BMI 32.28    Systolic 127 125  Diastolic 84 79 81  Pulse 113 83 101    General:  No acute distress,  Alert and oriented, Non-Toxic, Normal speech and affect Pannus: The patient has a very small pannus which does not hang past the symphysis pubis.  There is no evidence of panniculitis. Mammogram: Not applicable Assessment/Plan Pannus: The patient feels that she would benefit from removal of this small amount of skin to aid her and her self-catheterization.  I have discussed the procedure with the patient including the location of the scars and the amount of skin that she should anticipate having removed.  She was concerned that her umbilicus would be excised and I do not think that this is the case however there may be a small amount of distortion to the lower aspect of the umbilicus.  She has undergone multiple surgical procedures and understands that due to her connective tissue disorders that her scars are likely to be much larger than would normally be expected in patients undergoing this  procedure.  Gust the risks of bleeding infection seroma formation and she understands that I will likely use a drain postoperatively.  Will submit for insurance consideration at her request and schedule if she is approved.  11/27/2022, 1:11 PM

## 2022-12-05 DIAGNOSIS — G894 Chronic pain syndrome: Principal | ICD-10-CM

## 2022-12-05 MED ORDER — PREGABALIN 100 MG CAPSULE
ORAL_CAPSULE | 4 refills | 0 days
Start: 2022-12-05 — End: ?

## 2022-12-06 ENCOUNTER — Ambulatory Visit: Admit: 2022-12-06 | Discharge: 2022-12-06 | Payer: MEDICARE | Attending: Anesthesiology | Primary: Anesthesiology

## 2022-12-06 DIAGNOSIS — M5481 Occipital neuralgia: Secondary | ICD-10-CM | POA: Diagnosis not present

## 2022-12-06 DIAGNOSIS — G540 Brachial plexus disorders: Secondary | ICD-10-CM | POA: Diagnosis not present

## 2022-12-06 DIAGNOSIS — G43909 Migraine, unspecified, not intractable, without status migrainosus: Secondary | ICD-10-CM | POA: Diagnosis not present

## 2022-12-12 ENCOUNTER — Ambulatory Visit: Admit: 2022-12-12 | Discharge: 2022-12-13 | Payer: MEDICARE | Attending: Anesthesiology | Primary: Anesthesiology

## 2022-12-12 ENCOUNTER — Ambulatory Visit: Admit: 2022-12-12 | Discharge: 2022-12-13 | Payer: MEDICARE

## 2022-12-12 DIAGNOSIS — I7789 Other specified disorders of arteries and arterioles: Secondary | ICD-10-CM | POA: Diagnosis not present

## 2022-12-12 DIAGNOSIS — G540 Brachial plexus disorders: Principal | ICD-10-CM

## 2022-12-19 ENCOUNTER — Other Ambulatory Visit: Payer: Self-pay | Admitting: Obstetrics & Gynecology

## 2022-12-23 HISTORY — PX: OTHER SURGICAL HISTORY: SHX169

## 2022-12-26 HISTORY — PX: COLONOSCOPY WITH ESOPHAGOGASTRODUODENOSCOPY (EGD): SHX5779

## 2023-01-02 ENCOUNTER — Ambulatory Visit: Admit: 2023-01-02 | Discharge: 2023-01-03 | Payer: MEDICARE

## 2023-01-03 ENCOUNTER — Encounter: Admit: 2023-01-03 | Discharge: 2023-01-03 | Payer: MEDICARE | Attending: Anesthesiology | Primary: Anesthesiology

## 2023-01-03 ENCOUNTER — Ambulatory Visit: Admit: 2023-01-03 | Discharge: 2023-01-03 | Payer: MEDICARE

## 2023-01-03 DIAGNOSIS — I498 Other specified cardiac arrhythmias: Secondary | ICD-10-CM | POA: Diagnosis not present

## 2023-01-03 DIAGNOSIS — K219 Gastro-esophageal reflux disease without esophagitis: Secondary | ICD-10-CM | POA: Diagnosis not present

## 2023-01-03 DIAGNOSIS — J45909 Unspecified asthma, uncomplicated: Secondary | ICD-10-CM | POA: Diagnosis not present

## 2023-01-03 DIAGNOSIS — Z79890 Hormone replacement therapy (postmenopausal): Secondary | ICD-10-CM | POA: Diagnosis not present

## 2023-01-03 DIAGNOSIS — E785 Hyperlipidemia, unspecified: Secondary | ICD-10-CM | POA: Diagnosis not present

## 2023-01-03 DIAGNOSIS — Z7982 Long term (current) use of aspirin: Secondary | ICD-10-CM | POA: Diagnosis not present

## 2023-01-03 DIAGNOSIS — E039 Hypothyroidism, unspecified: Secondary | ICD-10-CM | POA: Diagnosis not present

## 2023-01-03 DIAGNOSIS — Q796 Ehlers-Danlos syndrome, unspecified: Secondary | ICD-10-CM | POA: Diagnosis not present

## 2023-01-03 DIAGNOSIS — Z79899 Other long term (current) drug therapy: Secondary | ICD-10-CM | POA: Diagnosis not present

## 2023-01-03 DIAGNOSIS — G90A Postural orthostatic tachycardia syndrome (POTS): Secondary | ICD-10-CM | POA: Diagnosis not present

## 2023-01-03 DIAGNOSIS — M25371 Other instability, right ankle: Secondary | ICD-10-CM | POA: Diagnosis not present

## 2023-01-03 MED ORDER — PROMETHAZINE 25 MG TABLET
ORAL_TABLET | Freq: Four times a day (QID) | ORAL | 0 refills | 5 days | Status: CP | PRN
Start: 2023-01-03 — End: 2023-01-08

## 2023-01-03 MED ORDER — ASPIRIN 325 MG TABLET
ORAL_TABLET | Freq: Every day | ORAL | 0 refills | 14 days | Status: CP
Start: 2023-01-03 — End: 2023-01-17

## 2023-01-07 ENCOUNTER — Encounter: Payer: Self-pay | Admitting: *Deleted

## 2023-01-09 DIAGNOSIS — M545 Low back pain, unspecified: Secondary | ICD-10-CM | POA: Diagnosis not present

## 2023-01-13 MED ORDER — ESTRADIOL 0.1 MG/GM VA CREA: TOPICAL_CREAM | VAGINAL | 12 refills | Status: DC

## 2023-01-17 DIAGNOSIS — M545 Low back pain, unspecified: Secondary | ICD-10-CM | POA: Diagnosis not present

## 2023-01-22 DIAGNOSIS — M24151 Other articular cartilage disorders, right hip: Secondary | ICD-10-CM | POA: Diagnosis not present

## 2023-01-22 DIAGNOSIS — Q6589 Other specified congenital deformities of hip: Secondary | ICD-10-CM | POA: Diagnosis not present

## 2023-01-22 DIAGNOSIS — M25552 Pain in left hip: Secondary | ICD-10-CM | POA: Diagnosis not present

## 2023-01-22 DIAGNOSIS — M25551 Pain in right hip: Secondary | ICD-10-CM | POA: Diagnosis not present

## 2023-01-22 DIAGNOSIS — G8929 Other chronic pain: Secondary | ICD-10-CM | POA: Diagnosis not present

## 2023-01-31 DIAGNOSIS — M545 Low back pain, unspecified: Secondary | ICD-10-CM | POA: Diagnosis not present

## 2023-02-02 ENCOUNTER — Encounter: Payer: Self-pay | Admitting: Plastic Surgery

## 2023-02-03 NOTE — Telephone Encounter (Signed)
Please see patient message re: insurance authorization

## 2023-02-04 ENCOUNTER — Telehealth: Payer: Self-pay | Admitting: *Deleted

## 2023-02-04 NOTE — Telephone Encounter (Signed)
Pending MCR DCN: [id number]  Pending [employer] [place]: [phone]

## 2023-02-10 ENCOUNTER — Other Ambulatory Visit: Payer: Self-pay | Admitting: *Deleted

## 2023-02-10 ENCOUNTER — Encounter: Payer: Self-pay | Admitting: Obstetrics & Gynecology

## 2023-02-12 DIAGNOSIS — R079 Chest pain, unspecified: Secondary | ICD-10-CM | POA: Diagnosis not present

## 2023-02-12 DIAGNOSIS — R0600 Dyspnea, unspecified: Secondary | ICD-10-CM | POA: Diagnosis not present

## 2023-02-12 DIAGNOSIS — R519 Headache, unspecified: Secondary | ICD-10-CM | POA: Diagnosis not present

## 2023-02-13 ENCOUNTER — Ambulatory Visit: Admit: 2023-02-13 | Discharge: 2023-02-13 | Payer: MEDICARE | Attending: Specialist | Primary: Specialist

## 2023-02-13 ENCOUNTER — Ambulatory Visit: Admit: 2023-02-13 | Discharge: 2023-02-13 | Payer: MEDICARE

## 2023-02-13 DIAGNOSIS — G540 Brachial plexus disorders: Secondary | ICD-10-CM | POA: Diagnosis not present

## 2023-02-13 DIAGNOSIS — Z01818 Encounter for other preprocedural examination: Principal | ICD-10-CM

## 2023-02-13 MED ORDER — PROMETHAZINE HCL 25 MG RE SUPP
25.0000 mg | Freq: Three times a day (TID) | RECTAL | 11 refills | Status: DC | PRN
Start: 2023-02-13 — End: 2024-02-25

## 2023-02-14 MED ORDER — METHYLPHENIDATE HCL ER 10 MG PO TBCR: 10.0000 mg | EXTENDED_RELEASE_TABLET | Freq: Every morning | ORAL | 0 refills | Status: DC

## 2023-02-20 ENCOUNTER — Ambulatory Visit: Admit: 2023-02-20 | Discharge: 2023-02-21 | Payer: MEDICARE

## 2023-02-21 ENCOUNTER — Telehealth: Payer: Self-pay | Admitting: *Deleted

## 2023-02-21 NOTE — Telephone Encounter (Signed)
LVM to let patient know I had to change surgery date

## 2023-02-28 ENCOUNTER — Ambulatory Visit: Payer: Medicare Other | Admitting: Physician Assistant

## 2023-02-28 NOTE — Progress Notes (Deleted)
   [id number] 

## 2023-03-14 ENCOUNTER — Ambulatory Visit (INDEPENDENT_AMBULATORY_CARE_PROVIDER_SITE_OTHER): Payer: Medicare Other | Admitting: Physician Assistant

## 2023-03-14 ENCOUNTER — Encounter: Payer: Self-pay | Admitting: Physician Assistant

## 2023-03-14 VITALS — Ht 63.0 in | Wt 162.8 lb

## 2023-03-14 DIAGNOSIS — E65 Localized adiposity: Secondary | ICD-10-CM

## 2023-03-14 NOTE — Progress Notes (Signed)
   Referring Provider Florian Buff, MD [address] [place],  [place] [address]   CC:  Chief Complaint  Patient presents with   Follow-up      Sharon Rowland is an 37 y.o. female.   HPI: This is a 37 year old female seen in our office for follow-up evaluation and further questions for upcoming panniculectomy.  The patient notes that she has lost some additional weight.  She would like to discuss if an abdominoplasty would be reasonable.  Review of Systems General: Positive for weight change  Physical Exam    03/14/2023   11:11 AM 11/27/2022    9:44 AM 10/17/2022   10:05 AM  Vitals with BMI  Height 5\' 3"  5\' 3"  5\' 3"   Weight 162 lbs 13 oz 182 lbs 3 oz   BMI [id number]   Systolic  [id number]  Diastolic  84 79  Pulse  [id number] 83    General:  No acute distress,  Alert and oriented, Non-Toxic, Normal speech and affect Pannus: The patient is a very small pannus, no signs of panniculitis  Assessment/Plan This is a 37 year old female following up in our office for further discussion.  She would like to discuss potential abdominoplasty.  Admittedly this would be something that I would need to discuss with Dr. Lovena Le to see if she would be candidate for this.  I will reach out to him and provide further recommendations at that time.  Sharon Rowland 03/14/2023, 4:11 PM

## 2023-03-21 ENCOUNTER — Encounter: Payer: Medicare Other | Admitting: Physician Assistant

## 2023-03-31 ENCOUNTER — Ambulatory Visit: Payer: Medicare Other | Admitting: Plastic Surgery

## 2023-03-31 DIAGNOSIS — M3500 Sicca syndrome, unspecified: Secondary | ICD-10-CM | POA: Diagnosis not present

## 2023-03-31 DIAGNOSIS — M359 Systemic involvement of connective tissue, unspecified: Secondary | ICD-10-CM | POA: Diagnosis not present

## 2023-04-02 ENCOUNTER — Encounter: Payer: Medicare Other | Admitting: Physician Assistant

## 2023-04-03 ENCOUNTER — Encounter: Payer: Self-pay | Admitting: Plastic Surgery

## 2023-04-03 ENCOUNTER — Ambulatory Visit (INDEPENDENT_AMBULATORY_CARE_PROVIDER_SITE_OTHER): Payer: BC Managed Care – PPO | Admitting: Plastic Surgery

## 2023-04-03 DIAGNOSIS — E65 Localized adiposity: Secondary | ICD-10-CM | POA: Diagnosis not present

## 2023-04-03 NOTE — Progress Notes (Signed)
Sharon Rowland returns today for reevaluation.  She states that she has lost 25 to 30 pounds since she last saw me and wants to ensure that she is still having the appropriate procedure.  On evaluation she still has a small pannus which is most evident when she sits down.  The pannus could certainly interfere with her daily hygiene and with her ability to self cath.  Do not believe that she needs any other procedure other than the ones currently scheduled.  She also question whether she has a small supraumbilical hernia.  On evaluation I do not feel 1.  As a matter fact she has a minimal rectus diastases given her history of 6 pregnancies.  Will proceed with panniculectomy as scheduled.

## 2023-04-04 ENCOUNTER — Other Ambulatory Visit: Payer: Self-pay | Admitting: Obstetrics & Gynecology

## 2023-04-07 ENCOUNTER — Ambulatory Visit (INDEPENDENT_AMBULATORY_CARE_PROVIDER_SITE_OTHER): Payer: BC Managed Care – PPO | Admitting: Physician Assistant

## 2023-04-07 VITALS — BP 113/82 | HR 98

## 2023-04-07 DIAGNOSIS — M793 Panniculitis, unspecified: Secondary | ICD-10-CM

## 2023-04-07 MED ORDER — OXYCODONE-ACETAMINOPHEN 5-325 MG PO TABS: 1.0000 | ORAL_TABLET | Freq: Four times a day (QID) | ORAL | 0 refills | Status: DC | PRN

## 2023-04-07 NOTE — Progress Notes (Signed)
Patient ID: Sharon Rowland, female    DOB: [birth year], 37 y.o.   MRN: [id number]  Chief Complaint  Patient presents with   Pre-op Exam    No diagnosis found.   History of Present Illness:  Sharon Rowland is a 37 y.o.  female  with a history of panniculitis.  She presents for preoperative evaluation for upcoming procedure, panniculectomy, scheduled for 04/28/2023 with Dr. Ladona Ridgel.  The patient has not had problems with anesthesia.   Summary of Previous Visit: The patient was last seen in the office on 04/03/2023 by Dr. Ladona Ridgel.  At that time she was following up to further discuss surgery.  They verified that she would be having panniculectomy only.  She was originally seen and evaluated on 11/27/2022 by Dr. Ladona Ridgel.  She had previously undergone bilateral breast reduction, she was interested in having her pannus removed because it interferes with her ability to perform self-catheterization.  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  Further she states she had urosepsis in the past because of contaminated catheter.  Since her last office visit she denies any changes to her baseline health.  No new medications.  She is not on any anticoagulation or antiplatelets.  She is not on any immune modulators.  She has not had any previous issues with surgery.  She does note hypertrophic scarring.  No history of DVT or PE or any significant risk factors.   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 , Disp: , Rfl:    betamethasone dipropionate 0.05 % cream, Apply 1 Application topically 2 (two) times daily as needed (irritation)., Disp: , Rfl:    botulinum toxin Type A (BOTOX) 200 units injection, Inject 200 Units as directed every 3 (three) months., Disp: , Rfl:    buPROPion (WELLBUTRIN XL) 300 MG 24 hr tablet, Take 1 tablet (300 mg total) by mouth daily., Disp: 90 tablet, Rfl: 3   Calcium-Vitamin D-Vitamin K (VIACTIV CALCIUM PLUS D) 650-12.5-40 MG-MCG-MCG CHEW, Chew 1 each by mouth daily., Disp: , Rfl:    cholecalciferol (VITAMIN D3) 25 MCG (1000 UNIT) tablet, Take 1,000 Units by mouth daily., Disp: , Rfl:    clobetasol cream (TEMOVATE) 0.05 %, Apply 1 Application topically daily as needed (irritation)., Disp: , Rfl:    conjugated estrogens (PREMARIN) vaginal cream, Place 1 Applicatorful vaginally daily. Use 1 gram nightly, Disp: 90 g, Rfl: 3   cromolyn (GASTROCROM) 100 MG/5ML solution, Take 100 mg by mouth daily., Disp: , Rfl:    cycloSPORINE (RESTASIS) 0.05 % ophthalmic emulsion, Place 1 drop into both eyes 2 (two) times daily., Disp: , Rfl:    desonide (DESOWEN) 0.05 % ointment, Apply 1 application  topically 2 (two) times daily as needed (irritation)., Disp: , Rfl:    diclofenac (VOLTAREN) 50 MG EC tablet, Take 2 tablets (100  mg total) by mouth 2 (two) times daily., Disp: 360 tablet, Rfl: 3   estradiol (ESTRACE) 0.1 MG/GM vaginal cream, 1 gram at bedtime, Disp: 30 g, Rfl: 12   levothyroxine (SYNTHROID) 75 MCG tablet, Take 75 mcg by mouth daily before breakfast. , Disp: , Rfl:    levothyroxine (SYNTHROID) 88 MCG tablet, Take by mouth., Disp: , Rfl:    lidocaine (LIDODERM) 5 %, Place 3 patches onto the skin daily as needed (pain)., Disp: , Rfl:    methylphenidate 10 MG ER tablet, Take 1 tablet (10 mg total) by mouth every  morning., Disp: 30 tablet, Rfl: 0   metoCLOPramide (REGLAN) 5 MG tablet, Take 5 mg by mouth every 8 (eight) hours as needed for vomiting or nausea., Disp: , Rfl:    Multiple Vitamin (MULTIVITAMIN WITH MINERALS) TABS tablet, Take 1 tablet by mouth daily., Disp: , Rfl:    nitrofurantoin, macrocrystal-monohydrate, (MACROBID) 100 MG capsule, Take 1 capsule (100 mg total) by mouth at bedtime., Disp: 30 capsule, Rfl: 11   nystatin cream (MYCOSTATIN), Apply 1 Application topically 2 (two) times daily as needed for dry skin (yeast)., Disp: , Rfl:    pregabalin (LYRICA) 100 MG capsule, Take 1 capsule (100 mg total) by mouth 2 (two) times daily., Disp: 180 capsule, Rfl: 3   promethazine (PHENERGAN) 25 MG suppository, Place 1 suppository (25 mg total) rectally every 8 (eight) hours as needed for vomiting or nausea., Disp: 12 each, Rfl: 11   QULIPTA 60 MG TABS, Take 1 tablet by mouth daily., Disp: , Rfl:    Rimegepant Sulfate (NURTEC) 75 MG TBDP, Take 75 mg by mouth every other day. ODT, Disp: , Rfl:   Past Medical Problems: Past Medical History:  Diagnosis Date   Allergic rhinitis    Anemia    Arthritis    Asthma    last used inhaler 1 month ago   Dysphagia    Dysrhythmia    SVT   Ehlers-Danlos syndrome    Female bladder prolapse    GERD (gastroesophageal reflux disease)    with pregnancy only   Hashimoto's thyroiditis    Lupus    Mast cell activation syndrome    Migraines    Osteoarthritis    Pelvic pain in antepartum period in second trimester 01/17/2014   POTS (postural orthostatic tachycardia syndrome)    Pregnant 08/01/2015   PSVT (paroxysmal supraventricular tachycardia)    could not be confirmed with 2016 study   Rectocele    Thyroid disease    Urethral prolapse    Uterine prolapse    Vaginal Pap smear, abnormal     Past Surgical History: Past Surgical History:  Procedure Laterality Date   ABDOMINAL HYSTERECTOMY     ANTERIOR AND POSTERIOR VAGINAL REPAIR     BREAST REDUCTION  SURGERY Bilateral 08/20/2022   Procedure: MAMMARY REDUCTION  (BREAST) with Liposuction;  Surgeon: Janne Napoleon, MD;  Location: [facility] OR;  Service: Plastics;  Laterality: Bilateral;  3 hours   CYSTOCELE REPAIR N/A 09/17/2017   Procedure: ANTERIOR REPAIR (CYSTOCELE);  Surgeon: Lazaro Arms, MD;  Location: [facility];  Service: Gynecology;  Laterality: N/A;   KNEE SURGERY Left 11/2021   KNEE SURGERY Right 2023   LIPOSUCTION  03/2022   SUPRAVENTRICULAR TACHYCARDIA ABLATION  01/05/2015   unsuccessful   SUPRAVENTRICULAR TACHYCARDIA ABLATION N/A 01/05/2015   Procedure: SUPRAVENTRICULAR TACHYCARDIA ABLATION;  Surgeon: Marinus Maw, MD;  Location: [facility] CATH LAB;  Service: Cardiovascular;  Laterality: N/A;   VAGINAL HYSTERECTOMY N/A 09/17/2017  Procedure: HYSTERECTOMY VAGINAL WITH POSSIBLE BILATERAL SALPINGECTOMY;  Surgeon: Lazaro Arms, MD;  Location: [facility];  Service: Gynecology;  Laterality: N/A;   WRIST GANGLION EXCISION Left    x2    Social History: Social History   Socioeconomic History   Marital status: Married    Spouse name: Not on file   Number of children: 6   Years of education: HS   Highest education level: Not on file  Occupational History   Occupation: Web designer  Tobacco Use   Smoking status: Never   Smokeless tobacco: Never  Vaping Use   Vaping Use: Never used  Substance and Sexual Activity   Alcohol use: No   Drug use: No   Sexual activity: Yes    Birth control/protection: Surgical    Comment: hyst  Other Topics Concern   Not on file  Social History Narrative   Lives with husband   Right handed    Caffeine use: 1-2 diet cokes per day   Social Determinants of Health   Financial Resource Strain: Not on file  Food Insecurity: Not on file  Transportation Needs: Not on file  Physical Activity: Not on file  Stress: Not on file  Social Connections: Not on file  Intimate Partner Violence: Not on file    Family History: Family History   Problem Relation Age of Onset   Hypertension Mother    Hyperlipidemia Mother    Multiple sclerosis Mother    COPD Mother    Cancer Mother        melanoma x 2   Diabetes Mother    Heart disease Father        PSVT   Hyperlipidemia Brother    Hypertension Brother    Heart disease Maternal Grandfather        heart attack   Cancer Paternal Grandmother        pancreatic cancer   Heart disease Paternal Grandmother        MI   Hyperlipidemia Paternal Grandmother    Hypertension Paternal Grandmother    Asthma Daughter    Asthma Son    Autism Son    ADD / ADHD Son    Tourette syndrome Son    Other Son        Neurological and mental issues   ADD / ADHD Son     Review of Systems: ROS  Physical Exam: Vital Signs BP 113/82 (BP Location: Left Arm, Patient Position: Sitting, Cuff Size: Small)   Pulse 98   LMP 10/23/2016 (Approximate)   SpO2 97%   Physical Exam  Constitutional:      General: Not in acute distress.    Appearance: Normal appearance. Not ill-appearing.  HENT:     Head: Normocephalic and atraumatic.  Eyes:     Pupils: Pupils are equal, round. Cardiovascular:  Normal rate Pulmonary:     Effort: No respiratory distress or increased work of breathing.  Speaks in full sentences. Musculoskeletal: Normal range of motion. No lower extremity swelling or edema. No varicosities.  Skin:    General: Skin is warm and dry.     Findings: No erythema or rash.  Neurological:     Mental Status: Alert and oriented to person, place, and time.  Psychiatric:        Mood and Affect: Mood normal.        Behavior: Behavior normal.    Assessment/Plan: The patient is scheduled for panniculectomy with Dr. Ladona Ridgel.  Risks, benefits, and alternatives of  procedure discussed, questions answered and consent obtained.    Smoking Status: Non-smoker  Caprini Score: 3; Risk Factors include: Age, BMI, length of surgery; Recommendation is early ambulation  Pictures obtained: Previous  visit  Post-op Rx sent to pharmacy: percocet, no antibiotics recommended per Dr. Ladona Ridgel  Patient was provided with the  General Surgical Risk consent document and Pain Medication Agreement prior to their appointment.  They had adequate time to read through the risk consent documents and Pain Medication Agreement. We also discussed them in person together during this preop appointment. All of their questions were answered to their satisfaction.  Recommended calling if they have any further questions.  Risk consent form and Pain Medication Agreement to be scanned into patient's chart.   Electronically signed by: Kelle Darting Ayvin Lipinski, PA-C 04/07/2023 12:31 PM

## 2023-04-07 NOTE — H&P (View-Only) (Signed)
   Patient ID: Sharon Rowland, female    DOB: 05/06/1986, 37 y.o.   MRN: [id number]  Chief Complaint  Patient presents with   Pre-op Exam    No diagnosis found.   History of Present Illness:  Sharon Rowland is a 37 y.o.  female  with a history of panniculitis.  She presents for preoperative evaluation for upcoming procedure, panniculectomy, scheduled for 04/28/2023 with Dr. Taylor.  The patient has not had problems with anesthesia.   Summary of Previous Visit: The patient was last seen in the office on 04/03/2023 by Dr. Taylor.  At that time she was following up to further discuss surgery.  They verified that she would be having panniculectomy only.  She was originally seen and evaluated on 11/27/2022 by Dr. Taylor.  She had previously undergone bilateral breast reduction, she was interested in having her pannus removed because it interferes with her ability to perform self-catheterization.  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  Further she states she had urosepsis in the past because of contaminated catheter.  Since her last office visit she denies any changes to her baseline health.  No new medications.  She is not on any anticoagulation or antiplatelets.  She is not on any immune modulators.  She has not had any previous issues with surgery.  She does note hypertrophic scarring.  No history of DVT or PE or any significant risk factors.   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 , Disp: , Rfl:    betamethasone dipropionate 0.05 % cream, Apply 1 Application topically 2 (two) times daily as needed (irritation)., Disp: , Rfl:    botulinum toxin Type A (BOTOX) 200 units injection, Inject 200 Units as directed every 3 (three) months., Disp: , Rfl:    buPROPion (WELLBUTRIN XL) 300 MG 24 hr tablet, Take 1 tablet (300 mg total) by mouth daily., Disp: 90 tablet, Rfl: 3   Calcium-Vitamin D-Vitamin K (VIACTIV CALCIUM PLUS D) 650-12.5-40 MG-MCG-MCG CHEW, Chew 1 each by mouth daily., Disp: , Rfl:    cholecalciferol (VITAMIN D3) 25 MCG (1000 UNIT) tablet, Take 1,000 Units by mouth daily., Disp: , Rfl:    clobetasol cream (TEMOVATE) 0.05 %, Apply 1 Application topically daily as needed (irritation)., Disp: , Rfl:    conjugated estrogens (PREMARIN) vaginal cream, Place 1 Applicatorful vaginally daily. Use 1 gram nightly, Disp: 90 g, Rfl: 3   cromolyn (GASTROCROM) 100 MG/5ML solution, Take 100 mg by mouth daily., Disp: , Rfl:    cycloSPORINE (RESTASIS) 0.05 % ophthalmic emulsion, Place 1 drop into both eyes 2 (two) times daily., Disp: , Rfl:    desonide (DESOWEN) 0.05 % ointment, Apply 1 application  topically 2 (two) times daily as needed (irritation)., Disp: , Rfl:    diclofenac (VOLTAREN) 50 MG EC tablet, Take 2 tablets (100   mg total) by mouth 2 (two) times daily., Disp: 360 tablet, Rfl: 3   estradiol (ESTRACE) 0.1 MG/GM vaginal cream, 1 gram at bedtime, Disp: 30 g, Rfl: 12   levothyroxine (SYNTHROID) 75 MCG tablet, Take 75 mcg by mouth daily before breakfast. , Disp: , Rfl:    levothyroxine (SYNTHROID) 88 MCG tablet, Take by mouth., Disp: , Rfl:    lidocaine (LIDODERM) 5 %, Place 3 patches onto the skin daily as needed (pain)., Disp: , Rfl:    methylphenidate 10 MG ER tablet, Take 1 tablet (10 mg total) by mouth every  morning., Disp: 30 tablet, Rfl: 0   metoCLOPramide (REGLAN) 5 MG tablet, Take 5 mg by mouth every 8 (eight) hours as needed for vomiting or nausea., Disp: , Rfl:    Multiple Vitamin (MULTIVITAMIN WITH MINERALS) TABS tablet, Take 1 tablet by mouth daily., Disp: , Rfl:    nitrofurantoin, macrocrystal-monohydrate, (MACROBID) 100 MG capsule, Take 1 capsule (100 mg total) by mouth at bedtime., Disp: 30 capsule, Rfl: 11   nystatin cream (MYCOSTATIN), Apply 1 Application topically 2 (two) times daily as needed for dry skin (yeast)., Disp: , Rfl:    pregabalin (LYRICA) 100 MG capsule, Take 1 capsule (100 mg total) by mouth 2 (two) times daily., Disp: 180 capsule, Rfl: 3   promethazine (PHENERGAN) 25 MG suppository, Place 1 suppository (25 mg total) rectally every 8 (eight) hours as needed for vomiting or nausea., Disp: 12 each, Rfl: 11   QULIPTA 60 MG TABS, Take 1 tablet by mouth daily., Disp: , Rfl:    Rimegepant Sulfate (NURTEC) 75 MG TBDP, Take 75 mg by mouth every other day. ODT, Disp: , Rfl:   Past Medical Problems: Past Medical History:  Diagnosis Date   Allergic rhinitis    Anemia    Arthritis    Asthma    last used inhaler 1 month ago   Dysphagia    Dysrhythmia    SVT   Ehlers-Danlos syndrome    Female bladder prolapse    GERD (gastroesophageal reflux disease)    with pregnancy only   Hashimoto's thyroiditis    Lupus    Mast cell activation syndrome    Migraines    Osteoarthritis    Pelvic pain in antepartum period in second trimester 01/17/2014   POTS (postural orthostatic tachycardia syndrome)    Pregnant 08/01/2015   PSVT (paroxysmal supraventricular tachycardia)    could not be confirmed with 2016 study   Rectocele    Thyroid disease    Urethral prolapse    Uterine prolapse    Vaginal Pap smear, abnormal     Past Surgical History: Past Surgical History:  Procedure Laterality Date   ABDOMINAL HYSTERECTOMY     ANTERIOR AND POSTERIOR VAGINAL REPAIR     BREAST REDUCTION  SURGERY Bilateral 08/20/2022   Procedure: MAMMARY REDUCTION  (BREAST) with Liposuction;  Surgeon: Luppens, Daniel, MD;  Location: [facility] OR;  Service: Plastics;  Laterality: Bilateral;  3 hours   CYSTOCELE REPAIR N/A 09/17/2017   Procedure: ANTERIOR REPAIR (CYSTOCELE);  Surgeon: Eure, Luther H, MD;  Location: [facility];  Service: Gynecology;  Laterality: N/A;   KNEE SURGERY Left 11/2021   KNEE SURGERY Right 2023   LIPOSUCTION  03/2022   SUPRAVENTRICULAR TACHYCARDIA ABLATION  01/05/2015   unsuccessful   SUPRAVENTRICULAR TACHYCARDIA ABLATION N/A 01/05/2015   Procedure: SUPRAVENTRICULAR TACHYCARDIA ABLATION;  Surgeon: Gregg W Taylor, MD;  Location: [facility] CATH LAB;  Service: Cardiovascular;  Laterality: N/A;   VAGINAL HYSTERECTOMY N/A 09/17/2017     Procedure: HYSTERECTOMY VAGINAL WITH POSSIBLE BILATERAL SALPINGECTOMY;  Surgeon: Eure, Luther H, MD;  Location: [facility];  Service: Gynecology;  Laterality: N/A;   WRIST GANGLION EXCISION Left    x2    Social History: Social History   Socioeconomic History   Marital status: Married    Spouse name: Not on file   Number of children: 6   Years of education: HS   Highest education level: Not on file  Occupational History   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  Tobacco Use   Smoking status: Never   Smokeless tobacco: Never  Vaping Use   Vaping Use: Never used  Substance and Sexual Activity   Alcohol use: No   Drug use: No   Sexual activity: Yes    Birth control/protection: Surgical    Comment: hyst  Other Topics Concern   Not on file  Social History Narrative   Lives with husband   Right handed    Caffeine use: 1-2 diet cokes per day   Social Determinants of Health   Financial Resource Strain: Not on file  Food Insecurity: Not on file  Transportation Needs: Not on file  Physical Activity: Not on file  Stress: Not on file  Social Connections: Not on file  Intimate Partner Violence: Not on file    Family History: Family History   Problem Relation Age of Onset   Hypertension Mother    Hyperlipidemia Mother    Multiple sclerosis Mother    COPD Mother    Cancer Mother        melanoma x 2   Diabetes Mother    Heart disease Father        PSVT   Hyperlipidemia Brother    Hypertension Brother    Heart disease Maternal Grandfather        heart attack   Cancer Paternal Grandmother        pancreatic cancer   Heart disease Paternal Grandmother        MI   Hyperlipidemia Paternal Grandmother    Hypertension Paternal Grandmother    Asthma Daughter    Asthma Son    Autism Son    ADD / ADHD Son    Tourette syndrome Son    Other Son        Neurological and mental issues   ADD / ADHD Son     Review of Systems: ROS  Physical Exam: Vital Signs BP 113/82 (BP Location: Left Arm, Patient Position: Sitting, Cuff Size: Small)   Pulse 98   LMP 10/23/2016 (Approximate)   SpO2 97%   Physical Exam  Constitutional:      General: Not in acute distress.    Appearance: Normal appearance. Not ill-appearing.  HENT:     Head: Normocephalic and atraumatic.  Eyes:     Pupils: Pupils are equal, round. Cardiovascular:  Normal rate Pulmonary:     Effort: No respiratory distress or increased work of breathing.  Speaks in full sentences. Musculoskeletal: Normal range of motion. No lower extremity swelling or edema. No varicosities.  Skin:    General: Skin is warm and dry.     Findings: No erythema or rash.  Neurological:     Mental Status: Alert and oriented to person, place, and time.  Psychiatric:        Mood and Affect: Mood normal.        Behavior: Behavior normal.    Assessment/Plan: The patient is scheduled for panniculectomy with Dr. Taylor.  Risks, benefits, and alternatives of   procedure discussed, questions answered and consent obtained.    Smoking Status: Non-smoker  Caprini Score: 3; Risk Factors include: Age, BMI, length of surgery; Recommendation is early ambulation  Pictures obtained: Previous  visit  Post-op Rx sent to pharmacy: percocet, no antibiotics recommended per Dr. Taylor  Patient was provided with the  General Surgical Risk consent document and Pain Medication Agreement prior to their appointment.  They had adequate time to read through the risk consent documents and Pain Medication Agreement. We also discussed them in person together during this preop appointment. All of their questions were answered to their satisfaction.  Recommended calling if they have any further questions.  Risk consent form and Pain Medication Agreement to be scanned into patient's chart.   Electronically signed by: Amrom Ore Todd Lois Ostrom, PA-C 04/07/2023 12:31 PM  

## 2023-04-15 DIAGNOSIS — Z01818 Encounter for other preprocedural examination: Principal | ICD-10-CM

## 2023-04-15 DIAGNOSIS — G540 Brachial plexus disorders: Principal | ICD-10-CM

## 2023-04-21 ENCOUNTER — Ambulatory Visit: Admit: 2023-04-21 | Discharge: 2023-04-22 | Payer: MEDICARE

## 2023-04-21 DIAGNOSIS — G43119 Migraine with aura, intractable, without status migrainosus: Principal | ICD-10-CM

## 2023-04-21 NOTE — Progress Notes (Signed)
Surgical Instructions    Your procedure is scheduled on Monday May 6th.  Report to [facility] Main Entrance "A" at 11 A.M., then check in with the Admitting office.  Call this number if you have problems the morning of surgery:  [phone]   If you have any questions prior to your surgery date call [phone]: Open Monday-Friday 8am-4pm If you experience any cold or flu symptoms such as cough, fever, chills, shortness of breath, etc. between now and your scheduled surgery, please notify us at the above number     Remember:  Do not eat after midnight the night before your surgery  You may drink clear liquids until 10am the morning of your surgery.   Clear liquids allowed are: Water, Non-Citrus Juices (without pulp), Carbonated Beverages, Clear Tea, Black Coffee ONLY (NO MILK, CREAM OR POWDERED CREAMER of any kind), and Gatorade    Take these medicines the morning of surgery with A SIP OF WATER: buPROPion (WELLBUTRIN XL) 300 MG 24 hr tablet  levothyroxine (SYNTHROID) 88 MCG tablet  pregabalin (LYRICA) 100 MG capsule   IF NEEDED  acetaminophen (TYLENOL) 500 MG tablet  albuterol (PROVENTIL HFA;VENTOLIN HFA) 108 (90 Base) MCG/ACT inhaler  nitrofurantoin, macrocrystal-monohydrate, (MACROBID) 100 MG capsule  oxyCODONE-acetaminophen (PERCOCET/ROXICET) 5-325 MG tablet    As of today, STOP taking any Aspirin (unless otherwise instructed by your surgeon) DICLOFENAC, Aleve, Naproxen, Ibuprofen, Motrin, Advil, Goody's, BC's, all herbal medications, fish oil, and all vitamins.           Do not wear jewelry or makeup. Do not wear lotions, powders, perfumes or deodorant. Do not shave 48 hours prior to surgery.   Do not bring valuables to the hospital. Do not wear nail polish, gel polish, artificial nails, or any other type of covering on natural nails (fingers and toes) If you have artificial nails or gel coating that need to be removed by a nail salon, please have this removed prior to  surgery. Artificial nails or gel coating may interfere with anesthesia's ability to adequately monitor your vital signs.  [facility] is not responsible for any belongings or valuables.    Do NOT Smoke (Tobacco/Vaping)  24 hours prior to your procedure  If you use a CPAP at night, you may bring your mask for your overnight stay.   Contacts, glasses, hearing aids, dentures or partials may not be worn into surgery, please bring cases for these belongings   For patients admitted to the hospital, discharge time will be determined by your treatment team.   Patients discharged the day of surgery will not be allowed to drive home, and someone needs to stay with them for 24 hours.   SURGICAL WAITING ROOM VISITATION Patients having surgery or a procedure may have no more than 2 support people in the waiting area - these visitors may rotate.   Children under the age of 92 must have an adult with them who is not the patient. If the patient needs to stay at the hospital during part of their recovery, the visitor guidelines for inpatient rooms apply. Pre-op nurse will coordinate an appropriate time for 1 support person to accompany patient in pre-op.  This support person may not rotate.   Please refer to [email or web address] for the visitor guidelines for Inpatients (after your surgery is over and you are in a regular room).    Special instructions:    Oral Hygiene is also important to reduce your risk of infection.  Remember - BRUSH YOUR TEETH THE  MORNING OF SURGERY WITH YOUR REGULAR TOOTHPASTE   [facility]- Preparing For Surgery  Before surgery, you can play an important role. Because skin is not sterile, your skin needs to be as free of germs as possible. You can reduce the number of germs on your skin by washing with CHG (chlorahexidine gluconate) Soap before surgery.  CHG is an antiseptic cleaner which kills germs and bonds with the skin to  continue killing germs even after washing.     Please do not use if you have an allergy to CHG or antibacterial soaps. If your skin becomes reddened/irritated stop using the CHG.  Do not shave (including legs and underarms) for at least 48 hours prior to first CHG shower. It is OK to shave your face.  Please follow these instructions carefully.     Shower the NIGHT BEFORE SURGERY and the MORNING OF SURGERY with CHG Soap.   If you chose to wash your hair, wash your hair first as usual with your normal shampoo. After you shampoo, rinse your hair and body thoroughly to remove the shampoo.  Then [employer] and genitals (private parts) with your normal soap and rinse thoroughly to remove soap.  After that Use CHG Soap as you would any other liquid soap. You can apply CHG directly to the skin and wash gently with a scrungie or a clean washcloth.   Apply the CHG Soap to your body ONLY FROM THE NECK DOWN.  Do not use on open wounds or open sores. Avoid contact with your eyes, ears, mouth and genitals (private parts). Wash Face and genitals (private parts)  with your normal soap.   Wash thoroughly, paying special attention to the area where your surgery will be performed.  Thoroughly rinse your body with warm water from the neck down.  DO NOT shower/wash with your normal soap after using and rinsing off the CHG Soap.  Pat yourself dry with a CLEAN TOWEL.  Wear CLEAN PAJAMAS to bed the night before surgery  Place CLEAN SHEETS on your bed the night before your surgery  DO NOT SLEEP WITH PETS.   Day of Surgery:  Take a shower with CHG soap. Wear Clean/Comfortable clothing the morning of surgery Do not apply any deodorants/lotions.   Remember to brush your teeth WITH YOUR REGULAR TOOTHPASTE.    If you received a COVID test during your pre-op visit, it is requested that you wear a mask when out in public, stay away from anyone that may not be feeling well, and notify your surgeon if you  develop symptoms. If you have been in contact with anyone that has tested positive in the last 10 days, please notify your surgeon.    Please read over the following fact sheets that you were given.

## 2023-04-22 ENCOUNTER — Other Ambulatory Visit: Payer: Self-pay

## 2023-04-22 ENCOUNTER — Encounter (HOSPITAL_COMMUNITY): Payer: Self-pay

## 2023-04-22 ENCOUNTER — Encounter (HOSPITAL_COMMUNITY)
Admission: RE | Admit: 2023-04-22 | Discharge: 2023-04-22 | Disposition: A | Discharge status: Home / Self Care | Payer: BC Managed Care – PPO | Source: Ambulatory Visit | Attending: Plastic Surgery | Admitting: Plastic Surgery

## 2023-04-22 VITALS — BP 116/87 | HR 105 | Temp 98.7°F | Resp 16 | Ht 63.0 in | Wt 160.0 lb

## 2023-04-22 DIAGNOSIS — D649 Anemia, unspecified: Secondary | ICD-10-CM | POA: Insufficient documentation

## 2023-04-22 DIAGNOSIS — J45909 Unspecified asthma, uncomplicated: Secondary | ICD-10-CM | POA: Insufficient documentation

## 2023-04-22 DIAGNOSIS — R000 Tachycardia, unspecified: Secondary | ICD-10-CM | POA: Insufficient documentation

## 2023-04-22 DIAGNOSIS — G90A Postural orthostatic tachycardia syndrome (POTS): Secondary | ICD-10-CM | POA: Diagnosis not present

## 2023-04-22 DIAGNOSIS — G43909 Migraine, unspecified, not intractable, without status migrainosus: Secondary | ICD-10-CM | POA: Diagnosis not present

## 2023-04-22 DIAGNOSIS — D8940 Mast cell activation, unspecified: Secondary | ICD-10-CM | POA: Insufficient documentation

## 2023-04-22 DIAGNOSIS — M1990 Unspecified osteoarthritis, unspecified site: Secondary | ICD-10-CM | POA: Insufficient documentation

## 2023-04-22 DIAGNOSIS — Z01818 Encounter for other preprocedural examination: Secondary | ICD-10-CM | POA: Insufficient documentation

## 2023-04-22 DIAGNOSIS — M793 Panniculitis, unspecified: Secondary | ICD-10-CM | POA: Diagnosis not present

## 2023-04-22 DIAGNOSIS — Q796 Ehlers-Danlos syndrome, unspecified: Secondary | ICD-10-CM | POA: Diagnosis not present

## 2023-04-22 DIAGNOSIS — R1310 Dysphagia, unspecified: Secondary | ICD-10-CM | POA: Insufficient documentation

## 2023-04-22 DIAGNOSIS — E063 Autoimmune thyroiditis: Secondary | ICD-10-CM | POA: Diagnosis not present

## 2023-04-22 HISTORY — DX: Sjogren syndrome, unspecified: M35.00

## 2023-04-22 LAB — CBC
HCT: 38.5 % (ref 36.0–46.0)
Hemoglobin: 12.6 g/dL (ref 12.0–15.0)
MCH: 27.6 pg (ref 26.0–34.0)
MCHC: 32.7 g/dL (ref 30.0–36.0)
MCV: 84.2 fL (ref 80.0–100.0)
Platelets: 265 10*3/uL (ref 150–400)
RBC: 4.57 MIL/uL (ref 3.87–5.11)
RDW: 13.4 % (ref 11.5–15.5)
WBC: 12.5 10*3/uL — ABNORMAL HIGH (ref 4.0–10.5)
nRBC: 0 % (ref 0.0–0.2)

## 2023-04-22 LAB — BASIC METABOLIC PANEL
Anion gap: 7 (ref 5–15)
BUN: <5 mg/dL — ABNORMAL LOW (ref 6–20)
CO2: 26 mmol/L (ref 22–32)
Calcium: 9 mg/dL (ref 8.9–10.3)
Chloride: 105 mmol/L (ref 98–111)
Creatinine, Ser: 0.84 mg/dL (ref 0.44–1.00)
GFR, Estimated: >60 mL/min (ref >60)
Glucose, Bld: 100 mg/dL — ABNORMAL HIGH (ref 70–99)
Potassium: 3.4 mmol/L — ABNORMAL LOW (ref 3.5–5.1)
Sodium: 138 mmol/L (ref 135–145)

## 2023-04-22 NOTE — Progress Notes (Addendum)
PCP - Sharon Rowland with [facility] in [place] Cardiologist - Dr. Kathryne Sharper with [facility] has SVT and POTS  PPM/ICD - Denies  Chest x-ray - N/I EKG - 04/22/23 Stress Test - 2021 [facility] ECHO - 2021 with [facility] - Denies  Sleep Study - No OSA  DM - Denies  Last dose of GLP1 agonist-  Ozempic on ;hold for surgery GLP1 instructions: Last dose 04/13/23  Blood Thinner Instructions:N/A Aspirin Instructions:N/A  ERAS Protcol -yes PRE-SURGERY Ensure    COVID TEST- N/A Had Covid 2 months ago  Patient denies any respiratory virus / illness in last two months, only COVID.   Anesthesia review: Yes  some cardiac history and autoimmune disorders.    Patient denies shortness of breath, fever, cough and chest pain at PAT appointment   All instructions explained to the patient, with a verbal understanding of the material. Patient agrees to go over the instructions while at home for a better understanding. The opportunity to ask questions was provided.

## 2023-04-23 ENCOUNTER — Encounter: Payer: Self-pay | Admitting: Obstetrics & Gynecology

## 2023-04-23 NOTE — Progress Notes (Addendum)
Anesthesia Chart Review:  Case: [id number] Date/Time: 04/28/23 1245   Procedure: PANNICULECTOMY (Abdomen)   Anesthesia type: General   Pre-op diagnosis: Panniculitis   [facility] ROOM 02 / [facility]   Surgeons: Santiago Glad, MD       DISCUSSION: Patient is a 37 year old female scheduled for the above procedure.   History includes never smoker, PSVT (unable to reproduce on 01/05/15 EP study), asthma, anemia, POTS, undifferentiated connective tissue disorder,  lupus/SLE, Ehlers-Danlos syndrome, mast cell activation syndrome, psoriatic arthritis, Hashimoto's thyroiditis (2020), dysphagia, migraines, osteoarthritis, hysterectomy, breast reduction (08/20/22). S/p right ankle arthroscopy, lateral ankle ligament reconstruction 01/03/23.   She has seen several cardiologists over the years for POTS, ST, PSVT. Most recently she says she was able to establish with Dr. Laural Golden around the Fall of 2023. She developed urosepsis and a breast infection requiring hospitalization and was evaluated by him then wanted to get established with a cardiologist closer to where she lives in [place], [place]. She believes she last saw him in November 2023. She denied any new testing since 2022, but says that prior to her next cardiology visit she is to have a routine echocardiogram due to Ehlers-Danlos syndrome and to re-evaluate her trivial MR/AR/PR/TR. She feels she is doing well from a cardiac standpoint. She has not been able to be as active (was walking with a cane), but since her January ankle surgery she is just now starting to be more independent and is able to go to stores again. Overall, she is able to manage her POTS by avoiding sudden movement changes and lying down if she starts to feels symptoms. She denied chest pain--no exertional symptoms. Asthma has been controlled with last inhaler use about 2 months ago when she had COVID-19.    05-12-23 EKG showed NSR, inferolateral T wave abnormality. Overall, EKG  appears similar to 03/28/22 EKG received from [facility] prior to her last surgery. She has had intermittent T wave abnormalities on tracing in [facility] dating back to January 2022 (01/02/21, 03/09/22), although primarily non-specific and more pronounced in inferior leads on some tracings. She has undergone multiple surgical procedures in the last year. Mobility was more limited, but improving after right ankle surgery in January. She denied any exertional symptoms. Awaiting most recent cardiology records from Dr. Kathryne Sharper.   Ozempic is on hold, last dose 04/13/23.   ADDENDUM 04/25/23 3:13 PM: [facility] records received. Last visit 10/15/22 with Dr. Kathryne Sharper to get established with a local cardiologist for ongoing management of her dysautonomia and tachycardia. He reviewed multiple records and has a quite comprehensive note. EKG then showed SR, non-specific T wave abnormality (diffuse, minimal negative/flat T waves)--which is less pronounced than her 05-12-23 EKG but that tracing was similar to a prior EKG from 03/28/22. He notes she had been on b-blockers and fludrocortisone previously for dysautonomia with some improvement but not on either currently, as tachycardia symptoms have become less bothersome but have not resolved. He did note an "exaggerated heart rate variation" on exam without change in positions. He encouraged her to use a "KP device" monitoring system through [facility]. Given Ehlers-Danlos Syndrome, which can be associated with valvular disease or aortic dilation, he wanted to update her echo prior to her next visit (echo scheduled for 05/07/23). She had only trivial regurgitation by 11/26/19 echo, and MRA of the chest on 03/27/22 showed patent great vessel and thoracic aorta that were normal in caliber. Normal caliber abdominal aorta on 03/31/22 CT. He is  scheduled to see her in routine follow-up on 05/28/23.   Activity has been somewhat limited recently given her dysautonomia and her recent ankle surgery,  but she has been increasing her activity. She did not report any anginal type symptoms. Her EKG appears similar to tracing from last year. I discussed case with anesthesiologist Marcene Duos, MD. Anesthesia team to evaluate on the day of surgery.   VS: BP 116/87   Pulse (!) 105   Temp 37.1 C   Resp 16   Ht 5\' 3"  (1.6 m)   Wt 72.6 kg   LMP 10/23/2016 (Approximate)   SpO2 99%   BMI 28.34 kg/m    PROVIDERS: Laren Everts, NP is PCP [facility] [place])  - Eure, Amaryllis Dyke, MD is GYN  - Laural Golden, MD is cardiologist ([facility] [place]. Previously she saw Rudean Haskell, MD at [facility] for POTS, last on  04/18/21. 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 She had "failed Florinef, beta-blocker, calcium channel blocker, and midodrine in the past, but reported good symptom improvment after IV fluid resuscitation." She deferred ivabradine. Continue compression garments and encouraged to drink 3L daily and get 8-12 g of sodium chloride intake. She has also seen Marcina Millard, MD [facility] [place]), last on 02/14/21 for fatigue with tachy-brady episode. No correlation of symptoms with diary entries on 14 Holter monitor. Continue current therapy. Also saw Youlanda Mighty, MD ([facility]) last on 11/07/20 for prolonged QT in setting of plaquenil and Zofran.  Last EP visit with Lewayne Bunting, MD ([facility]) was on 01/05/15 for EP Procedure and only inducible tachycardia was ST.  - Gerrie Nordmann, MD is rheumatologist [facility]). Last visit 03/31/23 for follow-up "undifferentiated connective tissue disease and psoriatic arthritis". She requested steroid taper of flare of her Bell's Palsy.  She is required multiple surgeries due to her Ehlers-Danlos syndrome.  She has tried multiple medications for her connective tissue disease. "Previously on MTX-stopped due to recurrent infections, HCQ- stopped due to QT prolongation, Otezla- stopped due to  mood changes, Cosentyx- stopped due to allergic reaction." Trial of Tremfya discussed. 40-month follow-up planned. Ned Clines, MD is pulmonologist [facility]) - Verdis Frederickson, MD is endocrinologist [facility]). Last visit 04/26/22.  Eather Colas, MD is GI [facility]). She had evidence of acute ascending colitis on 03/31/22 imaging. - Thora Lance, MD is vascular surgeon [facility]). 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 Discussed consideration of LUE decompression with pec minor release in March 2024, but appears to be scheduled for 05/13/23 at [facility].    LABS: Labs reviewed: Acceptable for surgery. (all labs ordered are listed, but only abnormal results are displayed)  Labs Reviewed  CBC - Abnormal; Notable for the following components:      Result Value   WBC 12.5 (*)    All other components within normal limits  BASIC METABOLIC PANEL - Abnormal; Notable for the following components:   Potassium 3.4 (*)    Glucose, Bld 100 (*)    BUN <5 (*)    All other components within normal limits     IMAGES: CXR 02/13/23 [facility]): FINDINGS:  Lungs are clear.  No pleural effusion or pneumothorax.  Normal heart size and mediastinal contours.   MRA Chest 03/27/22 [facility]): Impression: Mild dynamic narrowing of the subclavian veins bilaterally with arms in abduction as detailed above. No well-defined  collaterals or evidence of thrombosis.   CXR C-spine 12/13/21 [facility]): Impression: Normal cervical spine radiographs   MRI C-spine 12/05/21 [facility]): Impression: Straightening of cervical lordosis. Otherwise unremarkable MRI of cervical spine, with no significant spinal canal or neural foraminal narrowing.     EKG:  EKG 04/22/23: Normal sinus rhythm T wave abnormality, consider inferolateral ischemia Abnormal ECG When compared with ECG of 09-Mar-2022 18:43, T wave inversion  Inferior leads Confirmed by Camnitz, Will ([id number]) on 04/22/2023 4:30:34 PM Overall, EKG appears similar to 03/28/22 EKG received from [facility] prior to her 08/20/22 surgery. She has had intermittent T wave abnormalities on tracing in [facility] dating back to January 2022 (01/02/21, 03/09/22), although primarily non-specific and more pronounced in inferior leads on some tracings.   EKG 10/15/22 ([facility] [place]): Sinus rhythm Nonspecific T abnormality  EKG 03/28/22 ([facility]): Copy on chart ST at 123 bpm Nonspecific ST/T wave changes       CV: 14 Day Holter Monitor 12/2020: Per 01/25/22 office note by Dr. Darrold Junker ([facility]): "14-day Holter monitor was performed which revealed predominant sinus rhythm with mean heart rate of 81 bpm. Sinus bradycardia was observed with minimal heart rate 40 bpm. Sinus tachycardia was observed with a maximum heart rate of 179 bpm. Brief atrial runs were observed the longest lasting 4 beats. Rare premature ventricular contractions were present. There was no correlation with diary entries."   Echo 11/26/19 ([facility]): INTERPRETATION  NORMAL LEFT VENTRICULAR SYSTOLIC FUNCTION  NORMAL RIGHT VENTRICULAR SYSTOLIC FUNCTION  MILD VALVULAR REGURGITATION (trivial AR/MR/TR/PR)  NO VALVULAR STENOSIS    EP Procedure 01/05/15: - Radiofrequency energy application. Because we could only induce sinus tachycardia which was sustained, RF energy was not delivered. Conclusion:  Only inducible tachycardia was sinus tachycardia. There was non-sustained short RP tachycardia which could not be mapped.    Past Medical History:  Diagnosis Date   Allergic rhinitis    Anemia    Arthritis    Asthma    last used inhaler 1 month ago   Dysphagia    Dysrhythmia    SVT   Ehlers-Danlos syndrome    Female bladder prolapse    GERD (gastroesophageal reflux disease)    with pregnancy only   Hashimoto's thyroiditis    Lupus (HCC)    Mast cell activation syndrome (HCC)     Migraines    Osteoarthritis    Pelvic pain in antepartum period in second trimester 01/17/2014   POTS (postural orthostatic tachycardia syndrome)    Pregnant 08/01/2015   PSVT (paroxysmal supraventricular tachycardia)    could not be confirmed with 2016 study   Rectocele    Sjogren's syndrome (HCC)    Thyroid disease    Urethral prolapse    Uterine prolapse    Vaginal Pap smear, abnormal     Past Surgical History:  Procedure Laterality Date   ABDOMINAL HYSTERECTOMY     ANKLE SURGERY Bilateral    2023 L ankle debridement and internal brace 2024 R ankle debridement and internal brace   ANTERIOR AND POSTERIOR VAGINAL REPAIR     BREAST REDUCTION SURGERY Bilateral 08/20/2022   Procedure: MAMMARY REDUCTION  (BREAST) with Liposuction;  Surgeon: Janne Napoleon, MD;  Location: [facility] OR;  Service: Plastics;  Laterality: Bilateral;  3 hours   CYSTOCELE REPAIR N/A 09/17/2017   Procedure: ANTERIOR REPAIR (CYSTOCELE);  Surgeon: Lazaro Arms, MD;  Location: [facility] ORS;  Service: Gynecology;  Laterality: N/A;   KNEE SURGERY Left 11/2021   KNEE SURGERY Right  2023   LIPOSUCTION  03/2022   SUPRAVENTRICULAR TACHYCARDIA ABLATION  01/05/2015   unsuccessful   SUPRAVENTRICULAR TACHYCARDIA ABLATION N/A 01/05/2015   Procedure: SUPRAVENTRICULAR TACHYCARDIA ABLATION;  Surgeon: Marinus Maw, MD;  Location: [facility] CATH LAB;  Service: Cardiovascular;  Laterality: N/A;   VAGINAL HYSTERECTOMY N/A 09/17/2017   Procedure: HYSTERECTOMY VAGINAL WITH POSSIBLE BILATERAL SALPINGECTOMY;  Surgeon: Lazaro Arms, MD;  Location: [facility] ORS;  Service: Gynecology;  Laterality: N/A;   WRIST GANGLION EXCISION Left    x2    MEDICATIONS:  acetaminophen (TYLENOL) 500 MG tablet   albuterol (PROVENTIL HFA;VENTOLIN HFA) 108 (90 Base) MCG/ACT inhaler   betamethasone dipropionate 0.05 % cream   botulinum toxin Type A (BOTOX) 200 units injection   buPROPion (WELLBUTRIN XL) 300 MG 24 hr tablet   cholecalciferol (VITAMIN D3) 25 MCG (1000  UNIT) tablet   clobetasol cream (TEMOVATE) 0.05 %   conjugated estrogens (PREMARIN) vaginal cream   desonide (DESOWEN) 0.05 % ointment   diclofenac (VOLTAREN) 50 MG EC tablet   estradiol (ESTRACE) 0.1 MG/GM vaginal cream   Guselkumab (TREMFYA) 100 MG/ML SOPN   levothyroxine (SYNTHROID) 88 MCG tablet   lidocaine (LIDODERM) 5 %   methylphenidate 10 MG ER tablet   Multiple Vitamin (MULTIVITAMIN WITH MINERALS) TABS tablet   nitrofurantoin, macrocrystal-monohydrate, (MACROBID) 100 MG capsule   nystatin cream (MYCOSTATIN)   oxyCODONE-acetaminophen (PERCOCET/ROXICET) 5-325 MG tablet   pregabalin (LYRICA) 100 MG capsule   promethazine (PHENERGAN) 25 MG suppository   QULIPTA 60 MG TABS   SEMAGLUTIDE PO   No current facility-administered medications for this encounter.    Shonna Chock, PA-C Surgical Short Stay/Anesthesiology [facility] Phone [phone] [facility] Phone [phone] 04/24/2023 1:51 PM

## 2023-04-25 NOTE — Anesthesia Preprocedure Evaluation (Addendum)
Anesthesia Evaluation  Patient identified by MRN, date of birth, ID band Patient awake    Reviewed: Allergy & Precautions, NPO status , Patient's Chart, lab work & pertinent test results  History of Anesthesia Complications Negative for: history of anesthetic complications  Airway Mallampati: II  TM Distance: >3 FB Neck ROM: Full   Comment: Previous grade I view with MAC 3, easy mask Dental  (+) Dental Advisory Given   Pulmonary neg shortness of breath, asthma , neg sleep apnea, neg COPD, neg recent URI   Pulmonary exam normal breath sounds clear to auscultation       Cardiovascular (-) hypertension(-) angina (-) Past MI and (-) Cardiac Stents + dysrhythmias (s/p ablation) Supra Ventricular Tachycardia + Valvular Problems/Murmurs (trivial)  Rhythm:Regular Rate:Normal  Echo 11/26/19 ([facility]): INTERPRETATION  NORMAL LEFT VENTRICULAR SYSTOLIC FUNCTION  NORMAL RIGHT VENTRICULAR SYSTOLIC FUNCTION  MILD VALVULAR REGURGITATION (trivial AR/MR/TR/PR)  NO VALVULAR STENOSIS     Neuro/Psych  Headaches, neg Seizures    GI/Hepatic Neg liver ROS,GERD  ,,  Endo/Other  negative endocrine ROS    Renal/GU negative Renal ROS     Musculoskeletal  (+) Arthritis , Osteoarthritis,    Abdominal   Peds  Hematology  (+) Blood dyscrasia, anemia   Anesthesia Other Findings Lupus, mast cell activation syndrome, Sjogren's syndrome, Ehlers-Danlos syndrome, POTS  Reproductive/Obstetrics                             Anesthesia Physical Anesthesia Plan  ASA: 3  Anesthesia Plan: General   Post-op Pain Management: Tylenol PO (pre-op)*   Induction: Intravenous  PONV Risk Score and Plan: 3 and Dexamethasone, Treatment may vary due to age or medical condition and Midazolam  Airway Management Planned: Oral ETT  Additional Equipment:   Intra-op Plan:   Post-operative Plan: Extubation in OR  Informed Consent: I  have reviewed the patients History and Physical, chart, labs and discussed the procedure including the risks, benefits and alternatives for the proposed anesthesia with the patient or authorized representative who has indicated his/her understanding and acceptance.     Dental advisory given  Plan Discussed with: CRNA and Anesthesiologist  Anesthesia Plan Comments: (Patient had sore throat this weekend (now resolved). Patient's son tested positive for strep on Friday (5/3). Patient has been afebrile. Rapid strep test in preop is negative.  Patient okay with fentanyl while asleep but does not want fentanyl while awake. She would like to avoid Zofran, but is okay with a one time dose in PACU if needed.  PAT note written by Shonna Chock, PA-C.  Risks of general anesthesia discussed including, but not limited to, sore throat, hoarse voice, chipped/damaged teeth, injury to vocal cords, nausea and vomiting, allergic reactions, lung infection, heart attack, stroke, and death. All questions answered.   )       Anesthesia Quick Evaluation

## 2023-04-28 ENCOUNTER — Encounter (HOSPITAL_COMMUNITY)
Admission: RE | Disposition: A | Discharge status: Home / Self Care | Payer: Self-pay | Source: Ambulatory Visit | Attending: Plastic Surgery

## 2023-04-28 ENCOUNTER — Observation Stay (HOSPITAL_COMMUNITY)
Admission: RE | Admit: 2023-04-28 | Discharge: 2023-04-29 | Disposition: A | Discharge status: Home / Self Care | Payer: BC Managed Care – PPO | Source: Ambulatory Visit | Attending: Plastic Surgery | Admitting: Plastic Surgery

## 2023-04-28 ENCOUNTER — Other Ambulatory Visit: Payer: Self-pay

## 2023-04-28 ENCOUNTER — Ambulatory Visit (HOSPITAL_COMMUNITY): Payer: BC Managed Care – PPO | Admitting: Anesthesiology

## 2023-04-28 ENCOUNTER — Ambulatory Visit (HOSPITAL_COMMUNITY): Payer: BC Managed Care – PPO | Admitting: Vascular Surgery

## 2023-04-28 ENCOUNTER — Encounter (HOSPITAL_COMMUNITY): Payer: Self-pay | Admitting: Plastic Surgery

## 2023-04-28 DIAGNOSIS — Z91040 Latex allergy status: Secondary | ICD-10-CM | POA: Diagnosis not present

## 2023-04-28 DIAGNOSIS — Z79899 Other long term (current) drug therapy: Secondary | ICD-10-CM | POA: Diagnosis not present

## 2023-04-28 DIAGNOSIS — M793 Panniculitis, unspecified: Principal | ICD-10-CM

## 2023-04-28 DIAGNOSIS — J45909 Unspecified asthma, uncomplicated: Secondary | ICD-10-CM | POA: Diagnosis not present

## 2023-04-28 DIAGNOSIS — Z9889 Other specified postprocedural states: Secondary | ICD-10-CM

## 2023-04-28 HISTORY — PX: PANNICULECTOMY: SHX5360

## 2023-04-28 LAB — GROUP A STREP BY PCR: Group A Strep by PCR: NOT DETECTED

## 2023-04-28 LAB — HIV ANTIBODY (ROUTINE TESTING W REFLEX): HIV Screen 4th Generation wRfx: NONREACTIVE

## 2023-04-28 SURGERY — PANNICULECTOMY
Anesthesia: General | Site: Abdomen

## 2023-04-28 MED ORDER — MORPHINE SULFATE (PF) 2 MG/ML IV SOLN: 2.0000 mg | INTRAVENOUS | Status: DC | PRN

## 2023-04-28 MED ORDER — KETOROLAC TROMETHAMINE 30 MG/ML IJ SOLN
INTRAMUSCULAR | Status: AC
  Filled 2023-04-28: qty 1

## 2023-04-28 MED ORDER — DIPHENHYDRAMINE HCL 50 MG/ML IJ SOLN: 12.5000 mg | Freq: Once | INTRAMUSCULAR | Status: DC

## 2023-04-28 MED ORDER — ORAL CARE MOUTH RINSE: 15.0000 mL | OROMUCOSAL | Status: DC | PRN

## 2023-04-28 MED ORDER — PROPOFOL 10 MG/ML IV BOLUS
INTRAVENOUS | Status: AC
  Filled 2023-04-28: qty 20

## 2023-04-28 MED ORDER — BUPIVACAINE HCL (PF) 0.25 % IJ SOLN
INTRAMUSCULAR | Status: DC | PRN
  Administered 2023-04-28: 30 mL

## 2023-04-28 MED ORDER — LEVOTHYROXINE SODIUM 88 MCG PO TABS
88.0000 ug | ORAL_TABLET | Freq: Every day | ORAL | Status: DC
  Administered 2023-04-29: 88 ug via ORAL
  Filled 2023-04-28: qty 1

## 2023-04-28 MED ORDER — FENTANYL CITRATE (PF) 250 MCG/5ML IJ SOLN
INTRAMUSCULAR | Status: DC | PRN
  Administered 2023-04-28: 50 ug via INTRAVENOUS
  Administered 2023-04-28 (×2): 100 ug via INTRAVENOUS

## 2023-04-28 MED ORDER — CEFAZOLIN SODIUM-DEXTROSE 2-4 GM/100ML-% IV SOLN
2.0000 g | INTRAVENOUS | Status: AC
  Administered 2023-04-28: 2 g via INTRAVENOUS
  Filled 2023-04-28: qty 100

## 2023-04-28 MED ORDER — DIPHENHYDRAMINE HCL 50 MG/ML IJ SOLN
INTRAMUSCULAR | Status: AC
  Filled 2023-04-28: qty 1

## 2023-04-28 MED ORDER — POLYETHYLENE GLYCOL 3350 17 G PO PACK: 17.0000 g | PACK | Freq: Every day | ORAL | Status: DC | PRN

## 2023-04-28 MED ORDER — HYDROMORPHONE HCL 1 MG/ML IJ SOLN
0.2500 mg | INTRAMUSCULAR | Status: DC | PRN
  Administered 2023-04-28 (×2): 0.5 mg via INTRAVENOUS

## 2023-04-28 MED ORDER — CHLORHEXIDINE GLUCONATE CLOTH 2 % EX PADS: 6.0000 | MEDICATED_PAD | Freq: Once | CUTANEOUS | Status: DC

## 2023-04-28 MED ORDER — DEXAMETHASONE SODIUM PHOSPHATE 10 MG/ML IJ SOLN
INTRAMUSCULAR | Status: AC
  Filled 2023-04-28: qty 1

## 2023-04-28 MED ORDER — MIDAZOLAM HCL 2 MG/2ML IJ SOLN
INTRAMUSCULAR | Status: AC
  Filled 2023-04-28: qty 2

## 2023-04-28 MED ORDER — ACETAMINOPHEN 500 MG PO TABS
1000.0000 mg | ORAL_TABLET | Freq: Once | ORAL | Status: AC
  Administered 2023-04-28: 1000 mg via ORAL
  Filled 2023-04-28: qty 2

## 2023-04-28 MED ORDER — LACTATED RINGERS IV SOLN: INTRAVENOUS | Status: DC

## 2023-04-28 MED ORDER — DIPHENHYDRAMINE HCL 25 MG PO CAPS
25.0000 mg | ORAL_CAPSULE | Freq: Four times a day (QID) | ORAL | Status: DC | PRN
  Administered 2023-04-28: 25 mg via ORAL
  Filled 2023-04-28: qty 1

## 2023-04-28 MED ORDER — FENTANYL CITRATE (PF) 250 MCG/5ML IJ SOLN
INTRAMUSCULAR | Status: AC
  Filled 2023-04-28: qty 5

## 2023-04-28 MED ORDER — LIDOCAINE 2% (20 MG/ML) 5 ML SYRINGE
INTRAMUSCULAR | Status: DC | PRN
  Administered 2023-04-28: 100 mg via INTRAVENOUS

## 2023-04-28 MED ORDER — PREGABALIN 100 MG PO CAPS: 100.0000 mg | ORAL_CAPSULE | Freq: Two times a day (BID) | ORAL | Status: DC

## 2023-04-28 MED ORDER — MIDAZOLAM HCL 2 MG/2ML IJ SOLN
INTRAMUSCULAR | Status: DC | PRN
  Administered 2023-04-28: 2 mg via INTRAVENOUS

## 2023-04-28 MED ORDER — AMISULPRIDE (ANTIEMETIC) 5 MG/2ML IV SOLN: 10.0000 mg | Freq: Once | INTRAVENOUS | Status: DC | PRN

## 2023-04-28 MED ORDER — ORAL CARE MOUTH RINSE: 15.0000 mL | Freq: Once | OROMUCOSAL | Status: AC

## 2023-04-28 MED ORDER — SCOPOLAMINE 1 MG/3DAYS TD PT72
1.0000 | MEDICATED_PATCH | TRANSDERMAL | Status: DC
  Administered 2023-04-28: 1.5 mg via TRANSDERMAL
  Filled 2023-04-28: qty 1

## 2023-04-28 MED ORDER — DEXMEDETOMIDINE HCL IN NACL 80 MCG/20ML IV SOLN
INTRAVENOUS | Status: AC
  Filled 2023-04-28: qty 20

## 2023-04-28 MED ORDER — DEXMEDETOMIDINE HCL IN NACL 80 MCG/20ML IV SOLN
INTRAVENOUS | Status: DC | PRN
  Administered 2023-04-28: 8 ug via INTRAVENOUS

## 2023-04-28 MED ORDER — DIPHENHYDRAMINE HCL 50 MG/ML IJ SOLN
INTRAMUSCULAR | Status: DC | PRN
  Administered 2023-04-28: 25 mg via INTRAVENOUS

## 2023-04-28 MED ORDER — PROPOFOL 10 MG/ML IV BOLUS
INTRAVENOUS | Status: DC | PRN
  Administered 2023-04-28: 150 mg via INTRAVENOUS

## 2023-04-28 MED ORDER — ROCURONIUM BROMIDE 10 MG/ML (PF) SYRINGE
PREFILLED_SYRINGE | INTRAVENOUS | Status: DC | PRN
  Administered 2023-04-28 (×2): 40 mg via INTRAVENOUS

## 2023-04-28 MED ORDER — DIPHENHYDRAMINE HCL 50 MG/ML IJ SOLN: 25.0000 mg | Freq: Four times a day (QID) | INTRAMUSCULAR | Status: DC | PRN

## 2023-04-28 MED ORDER — BUPROPION HCL ER (XL) 150 MG PO TB24: 300.0000 mg | ORAL_TABLET | Freq: Every day | ORAL | Status: DC

## 2023-04-28 MED ORDER — 0.9 % SODIUM CHLORIDE (POUR BTL) OPTIME
TOPICAL | Status: DC | PRN
  Administered 2023-04-28: 1000 mL

## 2023-04-28 MED ORDER — PROPOFOL 500 MG/50ML IV EMUL
INTRAVENOUS | Status: DC | PRN
  Administered 2023-04-28: 150 ug/kg/min via INTRAVENOUS

## 2023-04-28 MED ORDER — CHLORHEXIDINE GLUCONATE 0.12 % MT SOLN
15.0000 mL | Freq: Once | OROMUCOSAL | Status: AC
  Administered 2023-04-28: 15 mL via OROMUCOSAL
  Filled 2023-04-28: qty 15

## 2023-04-28 MED ORDER — SUGAMMADEX SODIUM 200 MG/2ML IV SOLN
INTRAVENOUS | Status: DC | PRN
  Administered 2023-04-28: 150 mg via INTRAVENOUS

## 2023-04-28 MED ORDER — OXYCODONE HCL 5 MG PO TABS
5.0000 mg | ORAL_TABLET | ORAL | Status: DC | PRN
  Administered 2023-04-28: 10 mg via ORAL
  Filled 2023-04-28: qty 2

## 2023-04-28 MED ORDER — OXYCODONE HCL 5 MG PO TABS: 5.0000 mg | ORAL_TABLET | Freq: Once | ORAL | Status: DC | PRN

## 2023-04-28 MED ORDER — METHYLPHENIDATE HCL ER 10 MG PO TBCR: 10.0000 mg | EXTENDED_RELEASE_TABLET | Freq: Every morning | ORAL | Status: DC

## 2023-04-28 MED ORDER — ACETAMINOPHEN 500 MG PO TABS
1000.0000 mg | ORAL_TABLET | Freq: Four times a day (QID) | ORAL | Status: DC
  Administered 2023-04-28 – 2023-04-29 (×3): 1000 mg via ORAL
  Filled 2023-04-28 (×3): qty 2

## 2023-04-28 MED ORDER — HYDROMORPHONE HCL 1 MG/ML IJ SOLN
INTRAMUSCULAR | Status: AC
  Filled 2023-04-28: qty 1

## 2023-04-28 MED ORDER — BUPIVACAINE HCL (PF) 0.25 % IJ SOLN
INTRAMUSCULAR | Status: AC
  Filled 2023-04-28: qty 30

## 2023-04-28 MED ORDER — OXYCODONE HCL 5 MG/5ML PO SOLN: 5.0000 mg | Freq: Once | ORAL | Status: DC | PRN

## 2023-04-28 MED ORDER — PROMETHAZINE HCL 25 MG RE SUPP: 25.0000 mg | Freq: Three times a day (TID) | RECTAL | Status: DC | PRN

## 2023-04-28 MED ORDER — DEXAMETHASONE SODIUM PHOSPHATE 10 MG/ML IJ SOLN
INTRAMUSCULAR | Status: DC | PRN
  Administered 2023-04-28: 10 mg via INTRAVENOUS

## 2023-04-28 MED ORDER — KETOROLAC TROMETHAMINE 30 MG/ML IJ SOLN
INTRAMUSCULAR | Status: DC | PRN
  Administered 2023-04-28: 30 mg via INTRAVENOUS

## 2023-04-28 MED ORDER — KETOROLAC TROMETHAMINE 10 MG PO TABS: 10.0000 mg | ORAL_TABLET | Freq: Three times a day (TID) | ORAL | Status: DC | PRN

## 2023-04-28 SURGICAL SUPPLY — 57 items
ADH SKN CLS APL DERMABOND .7 (GAUZE/BANDAGES/DRESSINGS) ×2
APL PRP STRL LF DISP 70% ISPRP (MISCELLANEOUS) ×1
APPLIER CLIP 9.375 MED OPEN (MISCELLANEOUS)
APR CLP MED 9.3 20 MLT OPN (MISCELLANEOUS)
BAG COUNTER SPONGE SURGICOUNT (BAG) ×1 IMPLANT
BAG SPNG CNTER NS LX DISP (BAG) ×1
BINDER ABDOMINAL 12 ML 46-62 (SOFTGOODS) ×1 IMPLANT
BIOPATCH RED 1 DISK 7.0 (GAUZE/BANDAGES/DRESSINGS) ×2 IMPLANT
BLADE CLIPPER SURG (BLADE) IMPLANT
BLADE SURG 10 STRL SS (BLADE) ×2 IMPLANT
BLADE SURG 15 STRL LF DISP TIS (BLADE) ×1 IMPLANT
BLADE SURG 15 STRL SS (BLADE) ×1
CANISTER SUCT 3000ML PPV (MISCELLANEOUS) ×1 IMPLANT
CHLORAPREP W/TINT 26 (MISCELLANEOUS) ×2 IMPLANT
CLIP APPLIE 9.375 MED OPEN (MISCELLANEOUS) ×1 IMPLANT
CONT SPEC PATH 64OZ SNAP LID (MISCELLANEOUS) ×1 IMPLANT
COVER SURGICAL LIGHT HANDLE (MISCELLANEOUS) ×1 IMPLANT
DERMABOND ADVANCED .7 DNX12 (GAUZE/BANDAGES/DRESSINGS) ×2 IMPLANT
DRAIN CHANNEL 19F RND (DRAIN) IMPLANT
DRAPE HALF SHEET 40X57 (DRAPES) ×1 IMPLANT
DRSG TEGADERM 4X4.75 (GAUZE/BANDAGES/DRESSINGS) ×2 IMPLANT
ELECT BLADE 4.0 EZ CLEAN MEGAD (MISCELLANEOUS) ×1
ELECT COATED BLADE 2.86 ST (ELECTRODE) ×2 IMPLANT
ELECT REM PT RETURN 9FT ADLT (ELECTROSURGICAL) ×2
ELECTRODE BLDE 4.0 EZ CLN MEGD (MISCELLANEOUS) ×1 IMPLANT
ELECTRODE REM PT RTRN 9FT ADLT (ELECTROSURGICAL) ×2 IMPLANT
EVACUATOR SILICONE 100CC (DRAIN) ×2 IMPLANT
GAUZE SPONGE 2X2 STRL 8-PLY (GAUZE/BANDAGES/DRESSINGS) IMPLANT
GAUZE SPONGE 4X4 12PLY STRL (GAUZE/BANDAGES/DRESSINGS) ×2 IMPLANT
GLOVE BIOGEL M STRL SZ7.5 (GLOVE) ×1 IMPLANT
GLOVE BIOGEL PI IND STRL 8 (GLOVE) ×1 IMPLANT
GOWN STRL REUS W/ TWL LRG LVL3 (GOWN DISPOSABLE) ×2 IMPLANT
GOWN STRL REUS W/ TWL XL LVL3 (GOWN DISPOSABLE) ×1 IMPLANT
GOWN STRL REUS W/TWL LRG LVL3 (GOWN DISPOSABLE) ×2
GOWN STRL REUS W/TWL XL LVL3 (GOWN DISPOSABLE) ×1
HEMOSTAT ARISTA ABSORB 1G (HEMOSTASIS) IMPLANT
KIT BASIN OR (CUSTOM PROCEDURE TRAY) ×1 IMPLANT
MARKER SKIN DUAL TIP RULER LAB (MISCELLANEOUS) IMPLANT
NDL HYPO 22X1.5 SAFETY MO (MISCELLANEOUS) IMPLANT
NEEDLE HYPO 22X1.5 SAFETY MO (MISCELLANEOUS) ×1 IMPLANT
NS IRRIG 1000ML POUR BTL (IV SOLUTION) ×1 IMPLANT
PACK GENERAL/GYN (CUSTOM PROCEDURE TRAY) ×1 IMPLANT
PACK UNIVERSAL I (CUSTOM PROCEDURE TRAY) ×1 IMPLANT
PAD ABD 8X10 STRL (GAUZE/BANDAGES/DRESSINGS) ×4 IMPLANT
PENCIL SMOKE EVACUATOR (MISCELLANEOUS) ×1 IMPLANT
PIN SAFETY STERILE (MISCELLANEOUS) ×1 IMPLANT
SLEEVE SCD COMPRESS KNEE MED (STOCKING) ×1 IMPLANT
SPONGE T-LAP 18X18 ~~LOC~~+RFID (SPONGE) ×2 IMPLANT
SUT MNCRL AB 3-0 PS2 27 (SUTURE) ×2 IMPLANT
SUT MNCRL AB 4-0 PS2 18 (SUTURE) ×2 IMPLANT
SUT SILK 2 0 SH (SUTURE) IMPLANT
SUT VIC AB 2-0 CT1 27 (SUTURE) ×4
SUT VIC AB 2-0 CT1 TAPERPNT 27 (SUTURE) ×2 IMPLANT
SYR CONTROL 10ML LL (SYRINGE) IMPLANT
TRAY FOLEY W/BAG SLVR 16FR (SET/KITS/TRAYS/PACK)
TRAY FOLEY W/BAG SLVR 16FR ST (SET/KITS/TRAYS/PACK) IMPLANT
UNDERPAD 30X36 HEAVY ABSORB (UNDERPADS AND DIAPERS) ×1 IMPLANT

## 2023-04-28 NOTE — Op Note (Signed)
DATE OF OPERATION: 04/28/2023  LOCATION: [facility] Main operating Room  PREOPERATIVE DIAGNOSIS: Pannus, inability to self catheterize  POSTOPERATIVE DIAGNOSIS: Same  PROCEDURE: Panniculectomy  SURGEON: Loren Racer, MD  ASSISTANT: Evelena Leyden  EBL: 25 cc  CONDITION: Stable  COMPLICATIONS: None  INDICATION: The patient, Sharon Rowland, is a 37 y.o. female born on 11-Jan-1986, is here for removal of her pannus.  Patient states that she has to self cath due to her neurogenic bladder.  She has a small amount of excess skin on her lower anterior abdominal wall which interferes with her ability to perform that self-catheterization.   PROCEDURE DETAILS:  The patient was seen prior to surgery and marked.   IV antibiotics were given. The patient was taken to the operating room and given a general anesthetic. A standard time out was performed and all information was confirmed by those in the room. SCDs were placed.   The abdomen was prepped and draped in the usual sterile manner.  A transverse incision was made on the lower portion of the abdomen starting 7 cm lateral to the left iliac crest and extending to 7 cm lateral to the right iliac crest.  Dissection was carried out in the midline down to the anterior abdominal wall fascia.  The skin and fat was elevated in a caudad to cranial fashion to just below the umbilicus.  The superior incision was determined based on ease of closure.  Once I felt that I had elevated as much skin as possible while still being able to close the wound without tension the superior incision was made sharply and dissection carried out down to the subcutaneous tissues with electrocautery.  Once the skin had been excised the wound was inspected for bleeding and meticulous hemostasis was achieved using electrocautery.  The wound was then irrigated with 1 L of warm saline.  A 19 French round drain was placed in the wound and brought out through a separate stab incision.  Scarpa's fascia  was approximated with interrupted 2-0 Vicryl sutures.  The dermal edges were approximated with interrupted and running 3-0 Monocryl sutures.  And the skin was closed with a running 4-0 Monocryl subcuticular stitch.  The skin edges were sealed with Dermabond.  The patient was placed in a compressive garment and awakened from anesthesia without incident.  There were no complications.  All instrument needle and sponge counts were reported as correct. The patient was allowed to wake up and taken to recovery room in stable condition at the end of the case. The family was notified at the end of the case.   The advanced practice practitioner (APP) assisted throughout the case.  The APP was essential in retraction and counter traction when needed to make the case progress smoothly.  This retraction and assistance made it possible to see the tissue plans for the procedure.  The assistance was needed for blood control, tissue re-approximation and assisted with closure of the incision site.

## 2023-04-28 NOTE — Discharge Instructions (Signed)
Activity As tolerated: NO showers until 3 days after surgery. Keep binder on 24/7 unless showering or changing dressings. This is important to prevent additional swelling.  NO driving while in pain, taking pain medication or if you are unable to safely react to traffic. No heavy activities. No lifting > 15 pounds.  Take Pain medication oxycodone as needed for severe pain. Otherwise, you can use ibuprofen or tylenol PRN as well as indicated on your pre-op breast reduction instruction sheet provided at your pre-op appointment.  Diet: Regular. Drink plenty of fluids (water, avoid juice/soda) and eat healthy, high protein, low carbs.  Wound Care: Keep dressing clean & dry. You may change bandages after showering if you continue to notice some drainage.  Special Instructions: Call Doctor if any unusual problems occur such as severe pain, excessive bleeding, unrelieved nausea/vomiting, fever &/or chills  Drains: Measure drain output from drains every 24 hours. Record this on a log for [place] to view during post-op appointments. Drainage will change colors over the next week to two weeks. This is normal.   Follow-up appointment: Scheduled for next week.

## 2023-04-28 NOTE — Anesthesia Procedure Notes (Signed)
Procedure Name: Intubation Date/Time: 04/28/2023 1:07 PM  Performed by: Samara Deist, CRNAPre-anesthesia Checklist: Patient identified, Emergency Drugs available, Suction available and Patient being monitored Patient Re-evaluated:Patient Re-evaluated prior to induction Oxygen Delivery Method: Circle System Utilized Preoxygenation: Pre-oxygenation with 100% oxygen Induction Type: IV induction Ventilation: Mask ventilation without difficulty Tube type: Oral Tube size: 7.0 mm Number of attempts: 1 Airway Equipment and Method: Stylet Placement Confirmation: ETT inserted through vocal cords under direct vision, positive ETCO2 and breath sounds checked- equal and bilateral Secured at: 21 cm Tube secured with: Tape Dental Injury: Teeth and Oropharynx as per pre-operative assessment

## 2023-04-28 NOTE — Progress Notes (Signed)
Pt has been educated about the bed alarm and pt refusing bed alarm, promise to call before getting up.

## 2023-04-28 NOTE — Anesthesia Postprocedure Evaluation (Signed)
Anesthesia Post Note  Patient: Sharon Rowland  Procedure(s) Performed: PANNICULECTOMY (Abdomen)     Patient location during evaluation: PACU Anesthesia Type: General Level of consciousness: awake and alert Pain management: pain level controlled Vital Signs Assessment: post-procedure vital signs reviewed and stable Respiratory status: spontaneous breathing, nonlabored ventilation and respiratory function stable Cardiovascular status: stable and blood pressure returned to baseline Anesthetic complications: no   No notable events documented.  Last Vitals:  Vitals:   04/28/23 1515 04/28/23 1543  BP: 107/72 119/71  Pulse: 96 85  Resp: 15 16  Temp:  36.8 C  SpO2: 96% 100%    Last Pain:  Vitals:   04/28/23 1543  TempSrc: Oral  PainSc:                  [name]

## 2023-04-28 NOTE — Interval H&P Note (Signed)
History and Physical Interval Note: Pt seen in pre op holding. Had a sore throat but her rapid strep in negative. Surgical site marked with her standing and sitting. All questions answered. Will proceed with a panniculectomy at her request.  04/28/2023 12:21 PM  Sharon Rowland  has presented today for surgery, with the diagnosis of Panniculitis.  The various methods of treatment have been discussed with the patient and family. After consideration of risks, benefits and other options for treatment, the patient has consented to  Procedure(s): PANNICULECTOMY (N/A) as a surgical intervention.  The patient's history has been reviewed, patient examined, no change in status, stable for surgery.  I have reviewed the patient's chart and labs.  Questions were answered to the patient's satisfaction.     Santiago Glad

## 2023-04-28 NOTE — Transfer of Care (Signed)
Immediate Anesthesia Transfer of Care Note  Patient: Sharon Rowland  Procedure(s) Performed: PANNICULECTOMY (Abdomen)  Patient Location: PACU  Anesthesia Type:General  Level of Consciousness: awake  Airway & Oxygen Therapy: Patient Spontanous Breathing and Patient connected to nasal cannula oxygen  Post-op Assessment: Report given to RN and Post -op Vital signs reviewed and stable  Post vital signs: Reviewed and stable  Last Vitals:  Vitals Value Taken Time  BP 124/72 04/28/23 1446  Temp 36.6 C 04/28/23 1445  Pulse 88 04/28/23 1449  Resp 18 04/28/23 1449  SpO2 99 % 04/28/23 1449  Vitals shown include unvalidated device data.  Last Pain:  Vitals:   04/28/23 1144  TempSrc:   PainSc: 0-No pain         Complications: No notable events documented.

## 2023-04-29 ENCOUNTER — Encounter (HOSPITAL_COMMUNITY): Payer: Self-pay | Admitting: Plastic Surgery

## 2023-04-29 DIAGNOSIS — M793 Panniculitis, unspecified: Secondary | ICD-10-CM | POA: Diagnosis not present

## 2023-04-29 NOTE — Discharge Summary (Signed)
Physician Discharge Summary  Patient ID: Sharon Rowland MRN: [id number] DOB/AGE: 37-10-1986 37 y.o.  Admit date: 04/28/2023 Discharge date: 04/29/2023  Admission Diagnoses:  Discharge Diagnoses:  Principal Problem:   S/P panniculectomy   Discharged Condition: Patient resting comfortably in bed on examination.  She states that her pain was well-controlled with oral analgesics.  Her husband will be picking her up today and assisting with her postoperative recovery.  She states that she has been ambulating well and tolerating p.o. intake without difficulty.  Voiding, but no BM yet.  She has already examined the surgical site and is quite pleased with the outcome at this time.  No complaints.  Reports approximately 50 cc output overnight from her drain.  No issues.  Hospital Course: Admitted for observation s/p panniculectomy.  Consults: None  Significant Diagnostic Studies: None.  Treatments: surgery: Panniculectomy.  Discharge Exam: Blood pressure 100/60, pulse 62, temperature 97.9 F (36.6 C), temperature source Oral, resp. rate 16, height 5\' 3"  (1.6 m), weight 72.6 kg, last menstrual period 10/23/2016, SpO2 98 %. General appearance: alert, cooperative, and no distress GI: Soft, nondistended.  No significant ecchymoses or other overlying skin changes.  Nontender throughout.  No incisional bleeding.  [place] drain intact and functional, normal-appearing output in bulb. Extremities: SCDs not in place, but no asymmetric swelling noted.  Disposition: Discharge disposition: 01-Home or Self Care      Discharge Instructions     Diet - low sodium heart healthy   Complete by: As directed    Increase activity slowly   Complete by: As directed       Allergies as of 04/29/2023       Reactions   Cosentyx [secukinumab] Anaphylaxis   Plaquenil [hydroxychloroquine] Other (See Comments)   Prolonged QT   Latex Dermatitis   Otezla [apremilast]    Mood changes    Zetia [ezetimibe] Nausea  And Vomiting   Zofran [ondansetron Hcl]    Prolonged QT   Tape Rash   Use paper tape        Medication List     TAKE these medications    acetaminophen 500 MG tablet Commonly known as: TYLENOL Take 1,000 mg by mouth every 6 (six) hours as needed for moderate pain.   albuterol 108 (90 Base) MCG/ACT inhaler Commonly known as: VENTOLIN HFA Inhale 2 puffs into the lungs every 6 (six) hours as needed for wheezing or shortness of breath.   betamethasone dipropionate 0.05 % cream Apply 1 Application topically 2 (two) times daily as needed (irritation).   botulinum toxin Type A 200 units injection Commonly known as: BOTOX Inject 200 Units as directed every 3 (three) months.   buPROPion 300 MG 24 hr tablet Commonly known as: WELLBUTRIN XL Take 1 tablet (300 mg total) by mouth daily.   cholecalciferol 25 MCG (1000 UNIT) tablet Commonly known as: VITAMIN D3 Take 1,000 Units by mouth daily.   clobetasol cream 0.05 % Commonly known as: TEMOVATE Apply 1 Application topically daily as needed (irritation).   desonide 0.05 % ointment Commonly known as: DESOWEN Apply 1 application  topically 2 (two) times daily as needed (irritation).   diclofenac 50 MG EC tablet Commonly known as: VOLTAREN Take 2 tablets (100 mg total) by mouth 2 (two) times daily.   estradiol 0.1 MG/GM vaginal cream Commonly known as: ESTRACE 1 gram at bedtime   levothyroxine 88 MCG tablet Commonly known as: SYNTHROID Take 88 mcg by mouth daily before breakfast.   lidocaine 5 % Commonly known  as: LIDODERM Place 1-3 patches onto the skin daily as needed (pain).   methylphenidate 10 MG ER tablet TAKE ONE TABLET BY MOUTH IN THE MORNING   multivitamin with minerals Tabs tablet Take 1 tablet by mouth daily.   nitrofurantoin (macrocrystal-monohydrate) 100 MG capsule Commonly known as: MACROBID Take 1 capsule (100 mg total) by mouth at bedtime. What changed:  when to take this reasons to take this    nystatin cream Commonly known as: MYCOSTATIN Apply 1 Application topically 2 (two) times daily as needed for dry skin (yeast).   oxyCODONE-acetaminophen 5-325 MG tablet Commonly known as: PERCOCET/ROXICET Take 1 tablet by mouth every 6 (six) hours as needed for severe pain.   pregabalin 100 MG capsule Commonly known as: LYRICA Take 1 capsule (100 mg total) by mouth 2 (two) times daily.   Premarin vaginal cream Generic drug: conjugated estrogens Place 1 Applicatorful vaginally daily. Use 1 gram nightly   promethazine 25 MG suppository Commonly known as: PHENERGAN Place 1 suppository (25 mg total) rectally every 8 (eight) hours as needed for vomiting or nausea.   Qulipta 60 MG Tabs Generic drug: Atogepant Take 1 tablet by mouth daily.   SEMAGLUTIDE PO Take 0.75 mg by mouth every Saturday.   Tremfya 100 MG/ML Sopn Generic drug: Guselkumab Inject 1 Dose into the skin every 30 (thirty) days.         [facility] [address] [place], [place] [address] [phone]  Signed: Evelena Leyden 04/29/2023, 8:01 AM

## 2023-04-29 NOTE — Plan of Care (Signed)

## 2023-05-05 ENCOUNTER — Telehealth: Admit: 2023-05-05 | Discharge: 2023-05-06 | Payer: PRIVATE HEALTH INSURANCE

## 2023-05-05 DIAGNOSIS — G43119 Migraine with aura, intractable, without status migrainosus: Principal | ICD-10-CM

## 2023-05-05 MED ORDER — QULIPTA 30 MG TABLET
ORAL_TABLET | Freq: Every day | ORAL | 11 refills | 0 days | Status: CP
Start: 2023-05-05 — End: ?

## 2023-05-05 MED ORDER — ATOGEPANT 30 MG TABLET
ORAL_TABLET | Freq: Every day | ORAL | 11 refills | 0 days | Status: CP
Start: 2023-05-05 — End: 2023-05-05

## 2023-05-07 ENCOUNTER — Encounter: Payer: Medicare Other | Admitting: Plastic Surgery

## 2023-05-07 ENCOUNTER — Encounter: Payer: Self-pay | Admitting: Plastic Surgery

## 2023-05-08 ENCOUNTER — Ambulatory Visit (INDEPENDENT_AMBULATORY_CARE_PROVIDER_SITE_OTHER): Payer: BC Managed Care – PPO | Admitting: Student

## 2023-05-08 DIAGNOSIS — M793 Panniculitis, unspecified: Secondary | ICD-10-CM

## 2023-05-08 NOTE — Progress Notes (Signed)
Patient is a 37 year old female who recently underwent panniculectomy with Dr. Ladona Ridgel on 04/28/2023.  She is 10 days postop.  Patient presents to the clinic today for postoperative follow-up.  Today, patient reports she is doing well.  She states that her pain has overall been very controlled.  She denies any fevers or chills.  She states that she is very happy with her outcome so far.  Patient reports that she has had about 25 cc come out of the drain in the past 24 hours.  Chaperone present on exam.  On exam, patient is sitting upright in no acute distress.  Abdomen is soft and nontender.  There is no overlying erythema.  There were no fluid collections palpated on exam.  Lower abdominal incision is intact and healing well.  There is no active drainage on exam.  There is no surrounding erythema.  JP drain is in place on the left.  It is functioning.  There is serosanguineous drainage in the bulb.  JP drain was removed without difficulty.  Patient tolerated well.  I discussed with the patient that I would like her to continue to wear compression at all times and avoid strenuous activity.  I discussed with her that she will most likely have some drainage from her drain insertion site.  I recommended gauze and tape daily or as needed for saturation to the area.  I told her that she may start Vaseline to the drain site in a few days.  I discussed with her that she can start Vaseline to the incision.  Patient to follow back up at her next scheduled appointment in about 2 weeks.  I instructed the patient to call back if she has any questions or concerns.

## 2023-05-13 ENCOUNTER — Ambulatory Visit: Admit: 2023-05-13 | Discharge: 2023-05-17 | Payer: PRIVATE HEALTH INSURANCE

## 2023-05-13 ENCOUNTER — Encounter: Admit: 2023-05-13 | Discharge: 2023-05-17 | Payer: PRIVATE HEALTH INSURANCE

## 2023-05-13 ENCOUNTER — Ambulatory Visit
Admit: 2023-05-13 | Discharge: 2023-05-17 | Disposition: A | Discharge status: Home / Self Care | Payer: MEDICARE | Admitting: Specialist

## 2023-05-13 ENCOUNTER — Encounter: Admit: 2023-05-13 | Discharge: 2023-05-17 | Payer: PRIVATE HEALTH INSURANCE | Attending: Specialist

## 2023-05-13 ENCOUNTER — Ambulatory Visit
Admit: 2023-05-13 | Discharge: 2023-05-17 | Disposition: A | Discharge status: Home / Self Care | Payer: PRIVATE HEALTH INSURANCE | Admitting: Specialist

## 2023-05-17 MED ORDER — METHOCARBAMOL 500 MG TABLET
ORAL_TABLET | Freq: Four times a day (QID) | ORAL | 0 refills | 14 days | Status: CP
Start: 2023-05-17 — End: ?
  Filled 2023-05-17: qty 56, 14d supply, fill #0

## 2023-05-17 MED ORDER — OXYCODONE 10 MG TABLET
ORAL_TABLET | ORAL | 0 refills | 5 days | Status: CP | PRN
Start: 2023-05-17 — End: 2023-05-22
  Filled 2023-05-17: qty 40, 5d supply, fill #0

## 2023-05-17 MED ORDER — DIPHENHYDRAMINE 25 MG TABLET
ORAL_TABLET | Freq: Four times a day (QID) | ORAL | 0 refills | 13 days | Status: CP | PRN
Start: 2023-05-17 — End: ?

## 2023-05-17 MED ORDER — PROMETHAZINE 12.5 MG TABLET
ORAL_TABLET | Freq: Four times a day (QID) | ORAL | 0 refills | 14 days | Status: CP | PRN
Start: 2023-05-17 — End: 2023-05-31
  Filled 2023-05-17: qty 56, 14d supply, fill #0

## 2023-05-21 ENCOUNTER — Encounter: Payer: Medicare Other | Admitting: Student

## 2023-05-21 NOTE — Progress Notes (Deleted)
Patient is a 37 year old female who underwent panniculectomy with Dr. Ladona Ridgel on 04/28/2023.  She is a little over 3 weeks postop.  Patient presents to the clinic today for postoperative follow-up.  Patient was last seen in the clinic on 05/08/2023.  At this visit, patient reported she is doing well.  She stated that she was having about 25 cc coming out of her drain in 24 hours.  On exam, abdomen was soft and nontender.  Lower abdominal incision was intact and healing well.  JP drain was removed without difficulty.  Plan was for patient to continue to wear compression at all times and apply Vaseline to her incision site.  Today,

## 2023-06-04 ENCOUNTER — Encounter: Payer: Medicare Other | Admitting: Student

## 2023-06-04 ENCOUNTER — Encounter: Payer: Self-pay | Admitting: Student

## 2023-06-04 ENCOUNTER — Ambulatory Visit (INDEPENDENT_AMBULATORY_CARE_PROVIDER_SITE_OTHER): Payer: BC Managed Care – PPO | Admitting: Student

## 2023-06-04 VITALS — BP 121/83 | HR 104 | Ht 63.0 in | Wt 150.0 lb

## 2023-06-04 DIAGNOSIS — M793 Panniculitis, unspecified: Secondary | ICD-10-CM

## 2023-06-04 DIAGNOSIS — Z9889 Other specified postprocedural states: Secondary | ICD-10-CM

## 2023-06-04 NOTE — Progress Notes (Deleted)
Patient is a 37 year old female who underwent panniculectomy with Dr. Ladona Ridgel on 04/28/2023.  She is 5 weeks postop.  Patient presents to the clinic today for postoperative follow-up.  Patient was last seen in the clinic on 05/08/2023.  At this visit, patient was doing well.  On exam, lower abdominal incision was intact.  There is no active drainage on exam.  Patient still has a JP drain in the left side which was putting out minimal drainage.  JP drain was removed.  Plan is for patient to continue compression at all times and apply Vaseline to her incision daily.  Today,

## 2023-06-04 NOTE — Progress Notes (Addendum)
Patient is a 37 year old female who underwent panniculectomy with Dr. Ladona Ridgel on 04/28/2023.  Patient is 5-1/2 weeks postop.  She presents to the clinic today for postoperative follow-up.  Patient was last seen in the clinic on 05/08/2023.  At this visit, patient reported she was doing well.  On exam, abdomen was soft and nontender.  There is no overlying erythema.  Incision was intact and healing well.  JP drain was removed without difficulty.  Plan is for patient to continue compression and apply Vaseline to her incision.  Today, patient reports she is doing well.  She states that since she was last seen, she had surgery for her thoracic outlet syndrome.  She states from her panniculectomy standpoint, she is doing very well.  She reports her incision has healed very nicely.  She is very happy with her outcome.  She states that self cathing has become much easier since surgery.  Patient does state though that she noticed that she might have a small hernia just superior and left of her umbilicus.  She denies any fevers or chills but she denies any other issues.    Chaperone present on exam.  On exam, patient is sitting upright in no acute distress.  Abdomen is soft and nontender.  There is no overlying erythema.  There is possibly a small soft palpable mass noted to the left upper quadrant just superior/lateral to the umbilicus.  There is no overlying skin changes.  Lower abdominal incision is intact and healing well.  There are a few areas where sutures are protruding from the incision.  These were cut and removed.  Patient tolerated well.  There is no active drainage on exam.  There are no signs of infection on exam.  I discussed with the patient that we can refer her to general surgery to workup potential hernia.  Patient requesting referral to Dr. Baxter Kail in [place] as she has been treated by this provider before. Patient was in agreement with this plan.  I recommended the patient continue to massage her  scar.  She states she has been using Vaseline which appears to be helping the incision.  She states she is unable to use silicone products as this irritates her skin.  I recommended she continue Vaseline and scar massage.  Patient expressed understanding.  I discussed with the patient that by next week, she does not have to wear compression anymore.  I also discussed with her she can gradually increase her activities.  Patient expressed understanding.  Patient to follow-up as needed.  Pictures were obtained of the patient and placed in the chart with the patient's or guardian's permission.

## 2023-06-04 NOTE — Addendum Note (Signed)
Addended by: Caroline More on: 06/04/2023 12:06 PM   Modules accepted: Orders

## 2023-06-05 ENCOUNTER — Encounter: Payer: Self-pay | Admitting: Plastic Surgery

## 2023-06-06 NOTE — Telephone Encounter (Signed)
Please see message. °

## 2023-06-28 ENCOUNTER — Ambulatory Visit
Admit: 2023-06-28 | Discharge: 2023-06-29 | Payer: MEDICARE | Attending: Student in an Organized Health Care Education/Training Program | Primary: Student in an Organized Health Care Education/Training Program

## 2023-06-28 DIAGNOSIS — G510 Bell's palsy: Principal | ICD-10-CM

## 2023-06-28 MED ORDER — PREDNISONE 10 MG TABLET
ORAL_TABLET | ORAL | 0 refills | 12 days | Status: CP
Start: 2023-06-28 — End: 2023-07-10

## 2023-07-03 ENCOUNTER — Ambulatory Visit: Admit: 2023-07-03 | Discharge: 2023-07-04 | Payer: MEDICARE | Attending: Specialist | Primary: Specialist

## 2023-07-06 DIAGNOSIS — G43119 Migraine with aura, intractable, without status migrainosus: Principal | ICD-10-CM

## 2023-07-18 ENCOUNTER — Other Ambulatory Visit: Payer: Self-pay | Admitting: Obstetrics & Gynecology

## 2023-07-21 ENCOUNTER — Encounter: Payer: Self-pay | Admitting: Obstetrics & Gynecology

## 2023-07-22 ENCOUNTER — Telehealth: Admit: 2023-07-22 | Discharge: 2023-07-23 | Payer: MEDICARE

## 2023-07-22 DIAGNOSIS — G43119 Migraine with aura, intractable, without status migrainosus: Principal | ICD-10-CM

## 2023-07-22 DIAGNOSIS — G44099 Other trigeminal autonomic cephalgias (TAC), not intractable: Principal | ICD-10-CM

## 2023-07-22 DIAGNOSIS — R29810 Facial weakness: Principal | ICD-10-CM

## 2023-07-22 MED ORDER — ATOGEPANT 60 MG TABLET
ORAL_TABLET | Freq: Every day | ORAL | 3 refills | 0 days | Status: CP
Start: 2023-07-22 — End: 2024-07-21

## 2023-07-24 DIAGNOSIS — G5622 Lesion of ulnar nerve, left upper limb: Secondary | ICD-10-CM

## 2023-07-24 HISTORY — DX: Lesion of ulnar nerve, left upper limb: G56.22

## 2023-08-04 ENCOUNTER — Other Ambulatory Visit: Payer: Self-pay | Admitting: Surgery

## 2023-08-05 ENCOUNTER — Inpatient Hospital Stay: Admission: RE | Admit: 2023-08-05 | Payer: Medicare Other | Source: Ambulatory Visit

## 2023-08-05 HISTORY — DX: Arthropathic psoriasis, unspecified: L40.50

## 2023-08-05 HISTORY — DX: Gastritis, unspecified, without bleeding: K29.70

## 2023-08-05 HISTORY — DX: Pneumonia, unspecified organism: J18.9

## 2023-08-06 ENCOUNTER — Inpatient Hospital Stay
Admission: RE | Admit: 2023-08-06 | Discharge: 2023-08-06 | Disposition: A | Discharge status: Home / Self Care | Payer: Medicare Other | Source: Ambulatory Visit

## 2023-08-06 ENCOUNTER — Encounter: Payer: Self-pay | Admitting: Surgery

## 2023-08-06 NOTE — Pre-Procedure Instructions (Signed)
Pre-admission testing interview not completed. 2 days of phone calls to patient and husband in attempts to complete, messages left, no return call. Dr. Binnie Rail (surgeon) office made aware. If patient returns the call, interview will be done at that point.

## 2023-08-10 ENCOUNTER — Ambulatory Visit: Admit: 2023-08-10 | Discharge: 2023-08-11 | Payer: PRIVATE HEALTH INSURANCE

## 2023-08-12 ENCOUNTER — Other Ambulatory Visit: Payer: Self-pay

## 2023-08-12 ENCOUNTER — Encounter
Admission: RE | Admit: 2023-08-12 | Discharge: 2023-08-12 | Disposition: A | Discharge status: Home / Self Care | Payer: BC Managed Care – PPO | Source: Ambulatory Visit | Attending: Surgery | Admitting: Surgery

## 2023-08-12 HISTORY — DX: Hypothyroidism, unspecified: E03.9

## 2023-08-12 HISTORY — DX: Depression, unspecified: F32.A

## 2023-08-12 HISTORY — DX: Other complications of anesthesia, initial encounter: T88.59XA

## 2023-08-12 NOTE — Patient Instructions (Addendum)
Your procedure is scheduled on: 08/13/23 - Wednesday Report to the Registration Desk on the 1st floor of the [facility]. To find out your arrival time, please call [phone] between 1PM - 3PM on: 08.20/24 - Tuesday If your arrival time is 6:00 am, do not arrive before that time as the [facility] entrance doors do not open until 6:00 am.  REMEMBER: Instructions that are not followed completely may result in serious medical risk, up to and including death; or upon the discretion of your surgeon and anesthesiologist your surgery may need to be rescheduled.  Do not eat food after midnight the night before surgery.  No gum chewing or hard candies.  You may however, drink CLEAR liquids up to 2 hours before you are scheduled to arrive for your surgery. Do not drink anything within 2 hours of your scheduled arrival time.  Clear liquids include: - water  - apple juice without pulp - gatorade (not RED colors) - black coffee or tea (Do NOT add milk or creamers to the coffee or tea) Do NOT drink anything that is not on this list.  In addition, your doctor has ordered for you to drink the provided:  Ensure Pre-Surgery Clear Carbohydrate Drink  Drinking this carbohydrate drink up to two hours before surgery helps to reduce insulin resistance and improve patient outcomes. Please complete drinking 2 hours before scheduled arrival time.  One week prior to surgery: Stop Anti-inflammatories (NSAIDS) such as Advil, Aleve, Ibuprofen, Motrin, Naproxen, Naprosyn and Aspirin based products such as Excedrin, Goody's Powder, BC Powder.  Stop ANY OVER THE COUNTER supplements until after surgery.  You may however, continue to take Tylenol if needed for pain up until the day of surgery.  Continue taking all prescribed medications with the exception of the following:  SEMAGLUTIDE hold 7 days prior to your surgery.   TAKE ONLY THESE MEDICATIONS THE MORNING OF SURGERY WITH A SIP OF WATER:  buPROPion  (WELLBUTRIN XL)  levothyroxine (SYNTHROID)  pregabalin (LYRICA)  QULIPTA    No Alcohol for 24 hours before or after surgery.  No Smoking including e-cigarettes for 24 hours before surgery.  No chewable tobacco products for at least 6 hours before surgery.  No nicotine patches on the day of surgery.  Do not use any "recreational" drugs for at least a week (preferably 2 weeks) before your surgery.  Please be advised that the combination of cocaine and anesthesia may have negative outcomes, up to and including death. If you test positive for cocaine, your surgery will be cancelled.  On the morning of surgery brush your teeth with toothpaste and water, you may rinse your mouth with mouthwash if you wish. Do not swallow any toothpaste or mouthwash.  Use CHG Soap or wipes as directed on instruction sheet.  Do not wear jewelry, make-up, hairpins, clips or nail polish.  Do not wear lotions, powders, or perfumes.   Do not shave body hair from the neck down 48 hours before surgery.  Contact lenses, hearing aids and dentures may not be worn into surgery.  Do not bring valuables to the hospital. [facility] is not responsible for any missing/lost belongings or valuables.   Notify your doctor if there is any change in your medical condition (cold, fever, infection).  Wear comfortable clothing (specific to your surgery type) to the hospital.  After surgery, you can help prevent lung complications by doing breathing exercises.  Take deep breaths and cough every 1-2 hours. Your doctor may order a  device called an Incentive Spirometer to help you take deep breaths. When coughing or sneezing, hold a pillow firmly against your incision with both hands. This is called "splinting." Doing this helps protect your incision. It also decreases belly discomfort.  If you are being admitted to the hospital overnight, leave your suitcase in the car. After surgery it may be brought to your room.  In case  of increased patient census, it may be necessary for you, the patient, to continue your postoperative care in the Same Day Surgery department.  If you are being discharged the day of surgery, you will not be allowed to drive home. You will need a responsible individual to drive you home and stay with you for 24 hours after surgery.   If you are taking public transportation, you will need to have a responsible individual with you.  Please call the Pre-admissions Testing Dept. at [phone] if you have any questions about these instructions.  Surgery Visitation Policy:  Patients having surgery or a procedure may have two visitors.  Children under the age of 2 must have an adult with them who is not the patient.  Inpatient Visitation:    Visiting hours are 7 a.m. to 8 p.m. Up to four visitors are allowed at one time in a patient room. The visitors may rotate out with other people during the day.  One visitor age 62 or older may stay with the patient overnight and must be in the room by 8 p.m.    Preparing for Surgery with CHLORHEXIDINE GLUCONATE (CHG) Soap  Chlorhexidine Gluconate (CHG) Soap  o An antiseptic cleaner that kills germs and bonds with the skin to continue killing germs even after washing  o Used for showering the night before surgery and morning of surgery  Before surgery, you can play an important role by reducing the number of germs on your skin.  CHG (Chlorhexidine gluconate) soap is an antiseptic cleanser which kills germs and bonds with the skin to continue killing germs even after washing.  Please do not use if you have an allergy to CHG or antibacterial soaps. If your skin becomes reddened/irritated stop using the CHG.  1. Shower the NIGHT BEFORE SURGERY and the MORNING OF SURGERY with CHG soap.  2. If you choose to wash your hair, wash your hair first as usual with your normal shampoo.  3. After shampooing, rinse your hair and body thoroughly to remove  the shampoo.  4. Use CHG as you would any other liquid soap. You can apply CHG directly to the skin and wash gently with a scrungie or a clean washcloth.  5. Apply the CHG soap to your body only from the neck down. Do not use on open wounds or open sores. Avoid contact with your eyes, ears, mouth, and genitals (private parts). Wash face and genitals (private parts) with your normal soap.  6. Wash thoroughly, paying special attention to the area where your surgery will be performed.  7. Thoroughly rinse your body with warm water.  8. Do not shower/wash with your normal soap after using and rinsing off the CHG soap.  9. Pat yourself dry with a clean towel.  10. Wear clean pajamas to bed the night before surgery.  12. Place clean sheets on your bed the night of your first shower and do not sleep with pets.  13. Shower again with the CHG soap on the day of surgery prior to arriving at the hospital.  14. Do not  apply any deodorants/lotions/powders.  15. Please wear clean clothes to the hospital.

## 2023-08-13 ENCOUNTER — Encounter: Payer: Self-pay | Admitting: Surgery

## 2023-08-13 ENCOUNTER — Encounter
Admission: RE | Disposition: A | Discharge status: Home / Self Care | Payer: Self-pay | Source: Ambulatory Visit | Attending: Surgery

## 2023-08-13 ENCOUNTER — Ambulatory Visit
Admission: RE | Admit: 2023-08-13 | Discharge: 2023-08-13 | Disposition: A | Discharge status: Home / Self Care | Payer: BC Managed Care – PPO | Source: Ambulatory Visit | Attending: Surgery | Admitting: Surgery

## 2023-08-13 ENCOUNTER — Other Ambulatory Visit: Payer: Self-pay

## 2023-08-13 ENCOUNTER — Ambulatory Visit: Payer: BC Managed Care – PPO | Admitting: Anesthesiology

## 2023-08-13 DIAGNOSIS — M5481 Occipital neuralgia: Secondary | ICD-10-CM | POA: Diagnosis not present

## 2023-08-13 DIAGNOSIS — E063 Autoimmune thyroiditis: Secondary | ICD-10-CM | POA: Insufficient documentation

## 2023-08-13 DIAGNOSIS — Q796 Ehlers-Danlos syndrome, unspecified: Secondary | ICD-10-CM | POA: Diagnosis not present

## 2023-08-13 DIAGNOSIS — D649 Anemia, unspecified: Secondary | ICD-10-CM

## 2023-08-13 DIAGNOSIS — L4050 Arthropathic psoriasis, unspecified: Secondary | ICD-10-CM | POA: Diagnosis not present

## 2023-08-13 DIAGNOSIS — G90A Postural orthostatic tachycardia syndrome (POTS): Secondary | ICD-10-CM | POA: Diagnosis not present

## 2023-08-13 DIAGNOSIS — K219 Gastro-esophageal reflux disease without esophagitis: Secondary | ICD-10-CM | POA: Insufficient documentation

## 2023-08-13 DIAGNOSIS — I7300 Raynaud's syndrome without gangrene: Secondary | ICD-10-CM | POA: Insufficient documentation

## 2023-08-13 DIAGNOSIS — M3500 Sicca syndrome, unspecified: Secondary | ICD-10-CM | POA: Insufficient documentation

## 2023-08-13 DIAGNOSIS — G44099 Other trigeminal autonomic cephalgias (TAC), not intractable: Secondary | ICD-10-CM | POA: Insufficient documentation

## 2023-08-13 DIAGNOSIS — G510 Bell's palsy: Secondary | ICD-10-CM | POA: Diagnosis not present

## 2023-08-13 DIAGNOSIS — D8940 Mast cell activation, unspecified: Secondary | ICD-10-CM | POA: Diagnosis not present

## 2023-08-13 DIAGNOSIS — Z01812 Encounter for preprocedural laboratory examination: Secondary | ICD-10-CM

## 2023-08-13 DIAGNOSIS — M329 Systemic lupus erythematosus, unspecified: Secondary | ICD-10-CM | POA: Diagnosis not present

## 2023-08-13 DIAGNOSIS — G5622 Lesion of ulnar nerve, left upper limb: Secondary | ICD-10-CM | POA: Insufficient documentation

## 2023-08-13 HISTORY — DX: Brachial plexus disorders: G54.0

## 2023-08-13 HISTORY — PX: ANTERIOR INTEROSSEOUS NERVE DECOMPRESSION: SHX5735

## 2023-08-13 LAB — POCT I-STAT, CHEM 8
BUN: 6 mg/dL (ref 6–20)
Calcium, Ion: 1.19 mmol/L (ref 1.15–1.40)
Chloride: 103 mmol/L (ref 98–111)
Creatinine, Ser: 0.8 mg/dL (ref 0.44–1.00)
Glucose, Bld: 91 mg/dL (ref 70–99)
HCT: 38 % (ref 36.0–46.0)
Hemoglobin: 12.9 g/dL (ref 12.0–15.0)
Potassium: 3.3 mmol/L — ABNORMAL LOW (ref 3.5–5.1)
Sodium: 138 mmol/L (ref 135–145)
TCO2: 25 mmol/L (ref 22–32)

## 2023-08-13 SURGERY — ANTERIOR INTEROSSEOUS NERVE DECOMPRESSION
Anesthesia: General | Site: Elbow | Laterality: Left

## 2023-08-13 MED ORDER — DROPERIDOL 2.5 MG/ML IJ SOLN: 0.6250 mg | Freq: Once | INTRAMUSCULAR | Status: DC | PRN

## 2023-08-13 MED ORDER — METOCLOPRAMIDE HCL 10 MG PO TABS: 5.0000 mg | ORAL_TABLET | Freq: Three times a day (TID) | ORAL | Status: DC | PRN

## 2023-08-13 MED ORDER — PROPOFOL 1000 MG/100ML IV EMUL
INTRAVENOUS | Status: AC
  Filled 2023-08-13: qty 100

## 2023-08-13 MED ORDER — ORAL CARE MOUTH RINSE: 15.0000 mL | Freq: Once | OROMUCOSAL | Status: AC

## 2023-08-13 MED ORDER — SODIUM CHLORIDE 0.9 % IV SOLN: INTRAVENOUS | Status: DC

## 2023-08-13 MED ORDER — MIDAZOLAM HCL 2 MG/2ML IJ SOLN
INTRAMUSCULAR | Status: AC
  Filled 2023-08-13: qty 2

## 2023-08-13 MED ORDER — ONDANSETRON HCL 4 MG/2ML IJ SOLN: 4.0000 mg | Freq: Four times a day (QID) | INTRAMUSCULAR | Status: DC | PRN

## 2023-08-13 MED ORDER — KETAMINE HCL 10 MG/ML IJ SOLN
INTRAMUSCULAR | Status: DC | PRN
  Administered 2023-08-13: 10 mg via INTRAVENOUS
  Administered 2023-08-13: 20 mg via INTRAVENOUS

## 2023-08-13 MED ORDER — SCOPOLAMINE 1 MG/3DAYS TD PT72
MEDICATED_PATCH | TRANSDERMAL | Status: DC | PRN
  Administered 2023-08-13: 1 via TRANSDERMAL

## 2023-08-13 MED ORDER — EPINEPHRINE PF 1 MG/ML IJ SOLN
INTRAMUSCULAR | Status: AC
  Filled 2023-08-13: qty 1

## 2023-08-13 MED ORDER — LACTATED RINGERS IV SOLN: INTRAVENOUS | Status: DC

## 2023-08-13 MED ORDER — OXYCODONE HCL 5 MG PO TABS: 5.0000 mg | ORAL_TABLET | Freq: Once | ORAL | Status: DC | PRN

## 2023-08-13 MED ORDER — BUPIVACAINE HCL (PF) 0.5 % IJ SOLN
INTRAMUSCULAR | Status: AC
  Filled 2023-08-13: qty 30

## 2023-08-13 MED ORDER — LIDOCAINE HCL (CARDIAC) PF 50 MG/5ML IV SOSY
PREFILLED_SYRINGE | INTRAVENOUS | Status: DC | PRN
  Administered 2023-08-13: 100 mg via INTRAVENOUS

## 2023-08-13 MED ORDER — DEXMEDETOMIDINE HCL IN NACL 200 MCG/50ML IV SOLN
INTRAVENOUS | Status: DC | PRN
  Administered 2023-08-13: 8 ug via INTRAVENOUS
  Administered 2023-08-13: 12 ug via INTRAVENOUS

## 2023-08-13 MED ORDER — KETOROLAC TROMETHAMINE 30 MG/ML IJ SOLN
INTRAMUSCULAR | Status: AC
  Filled 2023-08-13: qty 1

## 2023-08-13 MED ORDER — CEFAZOLIN SODIUM-DEXTROSE 2-4 GM/100ML-% IV SOLN
INTRAVENOUS | Status: AC
  Filled 2023-08-13: qty 100

## 2023-08-13 MED ORDER — ACETAMINOPHEN 10 MG/ML IV SOLN: 1000.0000 mg | Freq: Once | INTRAVENOUS | Status: DC | PRN

## 2023-08-13 MED ORDER — PROMETHAZINE HCL 25 MG/ML IJ SOLN: 6.2500 mg | INTRAMUSCULAR | Status: DC | PRN

## 2023-08-13 MED ORDER — CHLORHEXIDINE GLUCONATE 0.12 % MT SOLN
15.0000 mL | Freq: Once | OROMUCOSAL | Status: AC
  Administered 2023-08-13: 15 mL via OROMUCOSAL

## 2023-08-13 MED ORDER — OXYCODONE HCL 5 MG PO TABS: 5.0000 mg | ORAL_TABLET | ORAL | Status: DC | PRN

## 2023-08-13 MED ORDER — BUPIVACAINE HCL (PF) 0.5 % IJ SOLN: INTRAMUSCULAR | Status: DC | PRN

## 2023-08-13 MED ORDER — CEFAZOLIN SODIUM-DEXTROSE 2-4 GM/100ML-% IV SOLN
2.0000 g | INTRAVENOUS | Status: AC
  Administered 2023-08-13: 2 g via INTRAVENOUS

## 2023-08-13 MED ORDER — FAMOTIDINE 20 MG PO TABS
20.0000 mg | ORAL_TABLET | Freq: Once | ORAL | Status: AC
  Administered 2023-08-13: 20 mg via ORAL

## 2023-08-13 MED ORDER — ACETAMINOPHEN 325 MG PO TABS: 325.0000 mg | ORAL_TABLET | Freq: Four times a day (QID) | ORAL | Status: DC | PRN

## 2023-08-13 MED ORDER — PROPOFOL 10 MG/ML IV BOLUS
INTRAVENOUS | Status: AC
  Filled 2023-08-13: qty 20

## 2023-08-13 MED ORDER — KETOROLAC TROMETHAMINE 30 MG/ML IJ SOLN
30.0000 mg | Freq: Once | INTRAMUSCULAR | Status: AC
  Administered 2023-08-13: 30 mg via INTRAVENOUS

## 2023-08-13 MED ORDER — PROPOFOL 500 MG/50ML IV EMUL
INTRAVENOUS | Status: DC | PRN
  Administered 2023-08-13: 150 ug/kg/min via INTRAVENOUS
  Administered 2023-08-13: 170 mg via INTRAVENOUS
  Administered 2023-08-13: 30 mg via INTRAVENOUS
  Administered 2023-08-13: 85 ug/kg/min via INTRAVENOUS

## 2023-08-13 MED ORDER — DEXAMETHASONE SODIUM PHOSPHATE 10 MG/ML IJ SOLN
INTRAMUSCULAR | Status: DC | PRN
Start: 2023-08-13 — End: 2023-08-13
  Administered 2023-08-13: 10 mg via INTRAVENOUS

## 2023-08-13 MED ORDER — NITROFURANTOIN MONOHYD MACRO 100 MG PO CAPS: 100.0000 mg | ORAL_CAPSULE | Freq: Every day | ORAL | Status: DC | PRN

## 2023-08-13 MED ORDER — KETAMINE HCL 50 MG/5ML IJ SOSY
PREFILLED_SYRINGE | INTRAMUSCULAR | Status: AC
  Filled 2023-08-13: qty 5

## 2023-08-13 MED ORDER — ACETAMINOPHEN 10 MG/ML IV SOLN
INTRAVENOUS | Status: DC | PRN
  Administered 2023-08-13: 1000 mg via INTRAVENOUS

## 2023-08-13 MED ORDER — BUPIVACAINE LIPOSOME 1.3 % IJ SUSP
INTRAMUSCULAR | Status: DC | PRN
  Administered 2023-08-13: 10 mL

## 2023-08-13 MED ORDER — 0.9 % SODIUM CHLORIDE (POUR BTL) OPTIME
TOPICAL | Status: DC | PRN
  Administered 2023-08-13: 500 mL

## 2023-08-13 MED ORDER — MIDAZOLAM HCL 5 MG/5ML IJ SOLN
INTRAMUSCULAR | Status: DC | PRN
Start: 2023-08-13 — End: 2023-08-13
  Administered 2023-08-13: 2 mg via INTRAVENOUS

## 2023-08-13 MED ORDER — FAMOTIDINE 20 MG PO TABS
ORAL_TABLET | ORAL | Status: AC
  Filled 2023-08-13: qty 1

## 2023-08-13 MED ORDER — METOCLOPRAMIDE HCL 5 MG/ML IJ SOLN: 5.0000 mg | Freq: Three times a day (TID) | INTRAMUSCULAR | Status: DC | PRN

## 2023-08-13 MED ORDER — BUPIVACAINE-EPINEPHRINE 0.5% -1:200000 IJ SOLN
INTRAMUSCULAR | Status: DC | PRN
  Administered 2023-08-13: 10 mL

## 2023-08-13 MED ORDER — ONDANSETRON HCL 4 MG PO TABS: 4.0000 mg | ORAL_TABLET | Freq: Four times a day (QID) | ORAL | Status: DC | PRN

## 2023-08-13 MED ORDER — OXYCODONE HCL 5 MG/5ML PO SOLN: 5.0000 mg | Freq: Once | ORAL | Status: DC | PRN

## 2023-08-13 MED ORDER — CHLORHEXIDINE GLUCONATE 0.12 % MT SOLN
OROMUCOSAL | Status: AC
  Filled 2023-08-13: qty 15

## 2023-08-13 MED ORDER — SCOPOLAMINE 1 MG/3DAYS TD PT72
MEDICATED_PATCH | TRANSDERMAL | Status: AC
  Filled 2023-08-13: qty 1

## 2023-08-13 MED ORDER — ESMOLOL HCL 100 MG/10ML IV SOLN
INTRAVENOUS | Status: DC | PRN
  Administered 2023-08-13: 30 mg via INTRAVENOUS

## 2023-08-13 SURGICAL SUPPLY — 44 items
APL PRP STRL LF DISP 70% ISPRP (MISCELLANEOUS) ×2
APL SKNCLS STERI-STRIP NONHPOA (GAUZE/BANDAGES/DRESSINGS) ×1
BENZOIN TINCTURE PRP APPL 2/3 (GAUZE/BANDAGES/DRESSINGS) IMPLANT
BNDG CMPR 5X4 CHSV STRCH STRL (GAUZE/BANDAGES/DRESSINGS) ×1
BNDG CMPR STD VLCR NS LF 5.8X4 (GAUZE/BANDAGES/DRESSINGS) ×1
BNDG COHESIVE 4X5 TAN STRL LF (GAUZE/BANDAGES/DRESSINGS) ×1 IMPLANT
BNDG ELASTIC 4X5.8 VLCR NS LF (GAUZE/BANDAGES/DRESSINGS) ×1 IMPLANT
BNDG ESMARCH 4 X 12 STRL LF (GAUZE/BANDAGES/DRESSINGS) ×1
BNDG ESMARCH 4X12 STRL LF (GAUZE/BANDAGES/DRESSINGS) ×1 IMPLANT
CHLORAPREP W/TINT 26 (MISCELLANEOUS) ×2 IMPLANT
CORD BIP STRL DISP 12FT (MISCELLANEOUS) ×1 IMPLANT
CUFF TOURN SGL QUICK 18X4 (TOURNIQUET CUFF) IMPLANT
CUFF TOURN SGL QUICK 24 (TOURNIQUET CUFF)
CUFF TRNQT CYL 24X4X16.5-23 (TOURNIQUET CUFF) IMPLANT
ELECT REM PT RETURN 9FT ADLT (ELECTROSURGICAL) ×1
ELECTRODE REM PT RTRN 9FT ADLT (ELECTROSURGICAL) ×1 IMPLANT
FORCEPS JEWEL BIP 4-3/4 STR (INSTRUMENTS) ×1 IMPLANT
GAUZE SPONGE 4X4 12PLY STRL (GAUZE/BANDAGES/DRESSINGS) ×1 IMPLANT
GAUZE XEROFORM 1X8 LF (GAUZE/BANDAGES/DRESSINGS) ×1 IMPLANT
GLOVE INDICATOR 8.0 STRL GRN (GLOVE) ×1 IMPLANT
GOWN STRL REUS W/ TWL LRG LVL3 (GOWN DISPOSABLE) ×1 IMPLANT
GOWN STRL REUS W/ TWL XL LVL3 (GOWN DISPOSABLE) ×1 IMPLANT
GOWN STRL REUS W/TWL LRG LVL3 (GOWN DISPOSABLE) ×1
GOWN STRL REUS W/TWL XL LVL3 (GOWN DISPOSABLE) ×1
KIT TURNOVER KIT A (KITS) ×1 IMPLANT
LOOP VESSEL MAXI 1X406 RED (MISCELLANEOUS) ×1 IMPLANT
MANIFOLD NEPTUNE II (INSTRUMENTS) ×1 IMPLANT
NS IRRIG 500ML POUR BTL (IV SOLUTION) ×1 IMPLANT
PACK EXTREMITY ARMC (MISCELLANEOUS) ×1 IMPLANT
PAD ABD DERMACEA PRESS 5X9 (GAUZE/BANDAGES/DRESSINGS) ×1 IMPLANT
PAD CAST 4YDX4 CTTN HI CHSV (CAST SUPPLIES) ×3 IMPLANT
PADDING CAST COTTON 4X4 STRL (CAST SUPPLIES) ×3
STAPLER SKIN PROX 35W (STAPLE) IMPLANT
STOCKINETTE IMPERVIOUS 9X36 MD (GAUZE/BANDAGES/DRESSINGS) ×1 IMPLANT
STRIP CLOSURE SKIN 1/4X4 (GAUZE/BANDAGES/DRESSINGS) IMPLANT
SUT VIC AB 2-0 CT1 (SUTURE) ×1 IMPLANT
SUT VIC AB 2-0 CT1 27 (SUTURE) ×1
SUT VIC AB 2-0 CT1 36 (SUTURE) IMPLANT
SUT VIC AB 2-0 CT1 TAPERPNT 27 (SUTURE) IMPLANT
SUT VIC AB 3-0 SH 27 (SUTURE) ×1
SUT VIC AB 3-0 SH 27X BRD (SUTURE) IMPLANT
SWABSTK COMLB BENZOIN TINCTURE (MISCELLANEOUS) ×1 IMPLANT
TRAP FLUID SMOKE EVACUATOR (MISCELLANEOUS) ×1 IMPLANT
WATER STERILE IRR 500ML POUR (IV SOLUTION) ×1 IMPLANT

## 2023-08-13 NOTE — Anesthesia Procedure Notes (Signed)
Procedure Name: LMA Insertion Date/Time: 08/13/2023 10:00 AM  Performed by: Maryla Morrow., CRNAPre-anesthesia Checklist: Patient identified, Patient being monitored, Timeout performed, Emergency Drugs available and Suction available Patient Re-evaluated:Patient Re-evaluated prior to induction Oxygen Delivery Method: Circle system utilized Preoxygenation: Pre-oxygenation with 100% oxygen Induction Type: IV induction Ventilation: Mask ventilation without difficulty LMA: LMA inserted LMA Size: 4.0 Tube type: Oral Number of attempts: 1 Placement Confirmation: positive ETCO2 and breath sounds checked- equal and bilateral Tube secured with: Tape Dental Injury: Teeth and Oropharynx as per pre-operative assessment

## 2023-08-13 NOTE — Op Note (Signed)
08/13/2023  10:26 AM  Patient:   Sharon Rowland  Pre-Op Diagnosis:   Left cubital tunnel syndrome.  Post-Op Diagnosis:   Same  Procedure:   Decompression of ulnar nerve, left elbow.  Surgeon:   Maryagnes Amos, MD  Assistant:   None  Anesthesia:   General LMA  Findings:   As above.  Complications:   None  EBL:   0 cc  Fluids:   500 cc crystalloid  TT:   32 minutes at 250 mmHg  Drains:   None  Closure:   3-0 Vicryl subcuticular sutures  Brief Clinical Note:   The patient is a 38 year old female with a history of persistent pain and paresthesias to the ring and little fingers of her left hand.  The symptoms have persisted despite medications, activity modification, etc. The patient's history and examination were consistent with cubital tunnel syndrome. The patient presents at this time for decompression and possible subcutaneous anterior transposition of the ulnar nerve at the left elbow.  Procedure:   The patient was brought into the operating room and lain in the supine position. After adequate general laryngal mask anesthesia was obtained, the patient's left upper extremity was prepped with ChloraPrep solution before being draped sterilely. Preoperative antibiotics were administered. After performing a timeout to verify the appropriate surgical site, the limb was exsanguinated with an Esmarch and the tourniquet inflated to 250 mmHg.   An approximately 7-8 cm curvilinear incision was made along the course of the ulnar nerve posterior to the medial epicondyle. The incision was carried down through the subcutaneous tissues with care taken to avoid the small branches of the medial antebrachial nerve to expose the sheath overlying the cubital tunnel. The ulnar nerve was identified at the proximal end of the tunnel and was dissected free. The nerve was then carefully followed as the roof of the cubital tunnel was released from proximal to distal. Distally, the fascia overlying the  pronator muscle was released for several centimeters.  The nerve was carefully inspected and found to be intact.  There appeared to be no further areas of compression of the ulnar nerve as it coursed around the posterior aspect of the elbow.  Given the lack of tension on the nerve as the elbow was flexed and the fact that it showed no tendency toward subluxation with elbow flexion, it was felt that a subcutaneous anterior transposition was not indicated.  The wound was copiously irrigated with sterile saline solution before the subcutaneous tissues were closed using 2-0 Vicryl interrupted sutures. The skin was closed using 3-0 Vicryl subcuticular sutures before benzoin and Steri-Strips were applied at the skin. A solution of 10 cc of Exparel and 10 cc of 0.5% Sensorcaine with epinephrine was injected in and around the incision to help with postoperative analgesia. A sterile bulky dressing was applied to the arm before the patient was placed into a sling. The patient was then awakened, extubated, and returned to the recovery room in satisfactory condition after tolerating the procedure well.

## 2023-08-13 NOTE — Anesthesia Preprocedure Evaluation (Addendum)
Anesthesia Evaluation  Patient identified by MRN, date of birth, ID band Patient awake    Reviewed: Allergy & Precautions, H&P , NPO status , Patient's Chart, lab work & pertinent test results  History of Anesthesia Complications (+) PONV and history of anesthetic complications (if given fentanyl has severe itching)  Airway Mallampati: II  TM Distance: >3 FB Neck ROM: full    Dental no notable dental hx.    Pulmonary asthma    Pulmonary exam normal        Cardiovascular Exercise Tolerance: Good Normal cardiovascular exam+ dysrhythmias (s/p SUPRAVENTRICULAR TACHYCARDIA ABLATION)   POTS  Mast Cell Activation Syndrome Raynaud's   trivial regurgitation by 11/26/19 echo  QT prolongation    Neuro/Psych  Headaches   Depression    Cubital tunnel syndrome on left Thoracic outlet syndrome s/p Thoracic outlet decompression  Intractable migraine with aura without status migrainosus Occipital Neuralgia Trigeminal autonomic cephalgias  Right eye Bell's palsy  neurogenic bladder  Neuromuscular disease  negative psych ROS   GI/Hepatic Neg liver ROS,GERD  ,,Mild gastroparesis Pt denies retention symptoms   Endo/Other  Hypothyroidism    Renal/GU      Musculoskeletal 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   Abdominal Normal abdominal exam  (+)   Peds  Hematology negative hematology ROS (+)   Anesthesia Other Findings Past Medical History: Lupus No date: Allergic rhinitis No date: Anemia No date: Arthritis No date: Asthma     Comment:  last used inhaler 1 month ago No date: Complication of anesthesia     Comment:  if given fentanyl has severe itching 07/2023: Cubital tunnel syndrome on left No date: Depression No date: Dysphagia No date: Dysrhythmia     Comment:  SVT No date: Ehlers-Danlos syndrome No date: Female bladder prolapse No date: Gastritis No date:  GERD (gastroesophageal reflux disease)     Comment:  with pregnancy only No date: Hashimoto's thyroiditis No date: Hypothyroidism No date: Lupus (HCC) No date: Mast cell activation syndrome (HCC) No date: Migraines No date: Osteoarthritis 01/17/2014: Pelvic pain in antepartum period in second trimester No date: Pneumonia No date: POTS (postural orthostatic tachycardia syndrome) 08/01/2015: Pregnant No date: Psoriatic arthritis (HCC) No date: PSVT (paroxysmal supraventricular tachycardia)     Comment:  could not be confirmed with 2016 study No date: Rectocele No date: Sjogren's syndrome (HCC) No date: Thoracic outlet syndrome No date: Thyroid disease No date: Urethral prolapse No date: Uterine prolapse No date: Vaginal Pap smear, abnormal  Past Surgical History: No date: ANKLE SURGERY; Bilateral     Comment:  2023 L ankle debridement and internal brace 2024 R ankle              debridement and internal brace No date: ANTERIOR AND POSTERIOR VAGINAL REPAIR 08/20/2022: BREAST REDUCTION SURGERY; Bilateral     Comment:  Procedure: MAMMARY REDUCTION  (BREAST) with Liposuction;              Surgeon: Janne Napoleon, MD;  Location: [facility];  Service:              Plastics;  Laterality: Bilateral;  3 hours 12/26/2022: COLONOSCOPY WITH ESOPHAGOGASTRODUODENOSCOPY (EGD) 09/17/2017: CYSTOCELE REPAIR; N/A     Comment:  Procedure: ANTERIOR REPAIR (CYSTOCELE);  Surgeon: Lazaro Arms, MD;  Location: [facility];  Service: Gynecology;                Laterality:  N/A; 11/2021: KNEE SURGERY; Left 2023: KNEE SURGERY; Right 03/2022: LIPOSUCTION 04/28/2023: PANNICULECTOMY; N/A     Comment:  Procedure: PANNICULECTOMY;  Surgeon: Santiago Glad,               MD;  Location: [facility] OR;  Service: Plastics;  Laterality:               N/A; 01/05/2015: SUPRAVENTRICULAR TACHYCARDIA ABLATION     Comment:  unsuccessful 01/05/2015: SUPRAVENTRICULAR TACHYCARDIA ABLATION; N/A     Comment:  Procedure:  SUPRAVENTRICULAR TACHYCARDIA ABLATION;                Surgeon: Marinus Maw, MD;  Location: [facility];                Service: Cardiovascular;  Laterality: N/A; 2024: Thoracic outlet decompression 09/17/2017: VAGINAL HYSTERECTOMY; N/A     Comment:  Procedure: HYSTERECTOMY VAGINAL WITH POSSIBLE BILATERAL               SALPINGECTOMY;  Surgeon: Lazaro Arms, MD;  Location:               [facility];  Service: Gynecology;  Laterality: N/A; No date: WRIST GANGLION EXCISION; Left     Comment:  x2  BMI    Body Mass Index: 27.46 kg/m      Reproductive/Obstetrics negative OB ROS                             Anesthesia Physical Anesthesia Plan  ASA: 3  Anesthesia Plan: General LMA   Post-op Pain Management: Ofirmev IV (intra-op)* and Ketamine IV*   Induction: Intravenous  PONV Risk Score and Plan: 4 or greater and Dexamethasone, Midazolam, Treatment may vary due to age or medical condition, Propofol infusion, TIVA and Scopolamine patch - Pre-op  Airway Management Planned: LMA  Additional Equipment:   Intra-op Plan:   Post-operative Plan: Extubation in OR  Informed Consent: I have reviewed the patients History and Physical, chart, labs and discussed the procedure including the risks, benefits and alternatives for the proposed anesthesia with the patient or authorized representative who has indicated his/her understanding and acceptance.     Dental Advisory Given  Plan Discussed with: Anesthesiologist, CRNA and Surgeon  Anesthesia Plan Comments: (Pt requests no fentanyl due to itching. She requests to avoid zofran due to concern for prolonged QT.)        Anesthesia Quick Evaluation

## 2023-08-13 NOTE — Anesthesia Postprocedure Evaluation (Signed)
Anesthesia Post Note  Patient: Sharon Rowland  Procedure(s) Performed: SUBCUTANEOUS ANTERIOR TRANSPOSITION OF ULNAR NERVE AT LEFT ELBOW (Left: Elbow)  Patient location during evaluation: PACU Anesthesia Type: General Level of consciousness: awake and alert Pain management: pain level controlled Vital Signs Assessment: post-procedure vital signs reviewed and stable Respiratory status: spontaneous breathing, nonlabored ventilation and respiratory function stable Cardiovascular status: blood pressure returned to baseline and stable Postop Assessment: no apparent nausea or vomiting Anesthetic complications: no   There were no known notable events for this encounter.   Last Vitals:  Vitals:   08/13/23 1056 08/13/23 1111  BP:  (!) 146/89  Pulse: 65 65  Resp: 12 14  Temp:  36.4 C  SpO2: 100% 100%    Last Pain:  Vitals:   08/13/23 1111  TempSrc: Oral  PainSc: 0-No pain                 Foye Deer

## 2023-08-13 NOTE — Transfer of Care (Signed)
Immediate Anesthesia Transfer of Care Note  Patient: Sharon Rowland  Procedure(s) Performed: SUBCUTANEOUS ANTERIOR TRANSPOSITION OF ULNAR NERVE AT LEFT ELBOW (Left: Elbow)  Patient Location: PACU  Anesthesia Type:General  Level of Consciousness: awake, alert , and oriented  Airway & Oxygen Therapy: Patient Spontanous Breathing  Post-op Assessment: Report given to RN and Post -op Vital signs reviewed and stable  Post vital signs: stable  Last Vitals:  Vitals Value Taken Time  BP 128/74 08/13/23 1025  Temp    Pulse 79 08/13/23 1028  Resp 17 08/13/23 1028  SpO2 100 % 08/13/23 1028  Vitals shown include unfiled device data.  Last Pain:  Vitals:   08/13/23 0833  TempSrc: Oral         Complications: No notable events documented.

## 2023-08-13 NOTE — H&P (Signed)
History of Present Illness: Sharon Rowland is a 37 y.o. who presents today for a history and physical. She is to undergo a ulnar nerve transposition of the left elbow on 08/13/2023. Since her last visit here at the clinic there have been no change in her condition. Patient expresses her desire to proceed with surgery.  Patient states that she has been having discomfort to the medial-sided left elbow pain with radiation along the ulnar forearm to the ring and little fingers. She notes that her symptoms have been present for "years". She had been diagnosed with thoracic outlet syndrome and underwent corrective surgery for this about 2 months ago and note that most of her left upper extremity symptoms resolved, leaving her with only the present symptoms. She describes the symptoms as intermittent but quite irritating when present. She rates her pain at 2/10 on today's visit, and has been taking Tylenol as necessary with limited benefit. She has occasional difficulty with grasping and holding of objects as well as with repetitive grasping activities using her left hand. She denies any recent injury to the elbow, and denies any remote history of injury to the elbow.  Patient has been diagnosed with Ehlers-Danlos syndrome  Past Medical History: Anemia 2000  Arrhythmia 2004  Asthma without status asthmaticus (HHS-HCC)  Autonomic dysfunction  Bell's palsy  Dermatitis  Dysphasia  Eczema, unspecified  EDS (Ehlers-Danlos syndrome) (HHS-HCC)  Hashimoto's disease 10/07/2019  Hematuria 11/25/2018  History of blood transfusion 2018  History of pneumonia  Hypercholesterolemia 07/31/2020  Large breasts 04/14/2020  H/O breast reduction  Migraines  Pleurisy  Psoriatic arthritis (CMS/HHS-HCC) 07/23/2020  S/P vaginal hysterectomy 09/17/2017  Situational depression 05/04/2021  SVT (supraventricular tachycardia)  Systemic lupus erythematosus (CMS/HHS-HCC)  Urinary retention 03/04/2019  Formatting of this note  might be different from the original. Last Assessment & Plan: Formatting of this note might be different from the original. Stable - Continue to self-cath prn Formatting of this note might be different from the original. Last Assessment & Plan: Formatting of this note might be different from the original. Stable - Continue to self-cath prn   Past Surgical History:  ankle surgery Bilateral 2023  REDUCTION MAMMAPLASTY 2023  mpfl Bilateral 2023  Colon @ [facility] 12/26/2022  Poor colon prep/Repeat next available/CTL  EGD @ [facility] 12/26/2022  Gastritis/Repeat EGD prn/CTL  abalation  bilateral npfl reconstruction  12/22, 8/22  EPISIOTOMY/VAGINAL REPAIR  gangloin cyst removal x2  HYSTERECTOMY VAGINAL  SKIN BIOPSY   Past Family History: Hyperlipidemia (Elevated cholesterol) Mother  Diabetes Mother  COPD Mother  Multiple sclerosis Mother  Cancer Mother  Melanoma  High blood pressure (Hypertension) Mother  Autoimmune disease Mother  MS  Arthritis Mother  Melanoma Mother  Cancer Maternal Grandmother  Uterine  Autoimmune disease Maternal Grandmother  Diabetes  Lupus Maternal Grandmother  Diabetes Maternal Grandmother  High blood pressure (Hypertension) Maternal Grandmother  Autoimmune disease Maternal Aunt  RA  Rheum arthritis Maternal Aunt  Autoimmune disease Maternal Aunt  lupus  Lupus Maternal Aunt  Osteoarthritis Other  Coronary Artery Disease (Blocked arteries around heart) Paternal Aunt  Myocardial Infarction (Heart attack) Maternal Grandfather  High blood pressure (Hypertension) Maternal Grandfather  Myocardial Infarction (Heart attack) Paternal Grandmother  Glaucoma Maternal Uncle   Medications: acetaminophen (TYLENOL) 500 MG tablet Take 1,000 mg by mouth every 6 (six) hours as needed  albuterol 90 mcg/actuation inhaler Inhale 2 inhalations into the lungs every 6 (six) hours as needed for Wheezing or Shortness of Breath 1 each 3  betamethasone dipropionate (  DIPROSONE) 0.05  % cream  buPROPion (WELLBUTRIN XL) 300 MG XL tablet Take 1 tablet (300 mg total) by mouth every morning 90 tablet 1  cholecalciferol (VITAMIN D3) 5,000 unit capsule Take 1 capsule (5,000 Units total) by mouth once daily for Vitamin D Deficiency. 360 capsule 11  clobetasoL (TEMOVATE) 0.05 % ointment  conjugated estrogens (PREMARIN) 0.625 mg/gram vaginal cream Place vaginally once daily  desonide (DESOWEN) 0.05 % ointment Apply to affected area twice daily on the left hairline. Stop when smooth. 60 g 1  diclofenac (VOLTAREN) 100 mg XR tablet Take 50 mg by mouth 2 (two) times daily  guselkumab (TREMFYA) 100 mg/mL AtIn Inject subcutaneously  levothyroxine (SYNTHROID) 88 MCG tablet Take 1 tablet (88 mcg total) by mouth once daily Take on an empty stomach with a glass of water at least 30-60 minutes before breakfast. 90 tablet 4  lidocaine (LIDODERM) 5 % patch Apply patch to the most painful area for up to 12 hours in a 24 hour period. 60 patch 5  methylphenidate HCl (METADATE ER) 10 MG ER tablet Take 1 tablet (10 mg total) by mouth every morning 30 tablet 0  multivitamin tablet Take 1 tablet by mouth once daily  nitrofurantoin, macrocrystal-monohydrate, (MACROBID) 100 MG capsule Take 100 mg by mouth at bedtime  NURTEC ODT 75 mg disintegrating tablet Dissolve 1 tablet by mouth every other day. 15 tablet 10  nystatin (MYCOSTATIN) 100,000 unit/gram cream Apply twice to three times a day to itchy rash in skin folds until smooth/clear.  onabotulinumtoxinA (BOTOX) 200 unit SolR Inject 200 Units into the skin every 3 (three) months 1 each 3  pregabalin (LYRICA) 100 MG capsule Take 100 mg by mouth 2 (two) times daily  promethazine (PHENERGAN) 25 MG tablet  QULIPTA 60 mg Tab Take 1 tablet by mouth once daily   Allergies: Apremilast Rash, Other (See Comments) and Hallucination  Pt stated this med makes her depression worse , makes her angry.  Hydroxychloroquine Unknown and Other (See Comments)  Zofran  [Ondansetron Hcl] Other (See Comments)  Prolonged QT  Latex Dermatitis  Cosentyx [Secukinumab] Unknown  Zetia [Ezetimibe] Vomiting  Adhesive Rash, Other (See Comments) and Unknown  Use paper tape  Review of Systems: A comprehensive 14 point ROS was performed, reviewed, and the pertinent orthopaedic findings are documented in the HPI.  Physical Exam: BP 110/66 (BP Location: Left upper arm, Patient Position: Sitting, BP Cuff Size: Adult)  Ht 162.6 cm (5\' 4" )  Wt 70.6 kg (155 lb 9.6 oz)  LMP 10/27/2016  BMI 26.71 kg/m   General: Well-developed well-nourished female seen in no acute distress.   HEENT: Atraumatic,normocephalic. Pupils are equal and reactive to light. Oropharynx is clear with moist mucosa  Lungs: Clear to auscultation bilaterally   Cardiovascular: Regular rate and rhythm. Normal S1, S2. No murmurs. No appreciable gallops or rubs. Peripheral pulses are palpable.  Abdomen: Soft, non-tender, nondistended. Bowel sounds present  Left elbow exam: Skin inspection of the left elbow is unremarkable. No swelling, erythema, ecchymosis, abrasions, or other skin abnormalities are identified. She exhibits full active and passive range of motion of the elbow without pain or catching, and has no elbow effusion. She has mild tenderness to palpation over the medial aspect of the elbow, especially along the cubital tunnel, but has no tenderness over the medial upper condyle. No other areas of tenderness are noted around the elbow. She is neurovascularly intact to the left forearm and hand with good intrinsic strength and no numbness or paresthesias along  the ulnar nerve distribution of her hand. She has an equivocally positive Tinel's over the cubital tunnel.  Neurological: The patient is alert and oriented Sensation to light touch appears to be intact and within normal limits Gross motor strength appeared to be equal to 5/5  Vascular : Peripheral pulses felt to be  palpable. Capillary refill appears to be intact and within normal limits  Impression: 1. Left cubital tunnel syndrome.  Plan:  The treatment options were discussed with the patient. In addition, patient educational materials were provided regarding the diagnosis and treatment options. Based on her history, her symptoms are most consistent with cubital tunnel syndrome. The patient is quite frustrated by her residual symptoms and functional limitations following her thoracic outlet surgery procedure, and is now ready to consider more aggressive treatment options for her residual medial sided left elbow symptoms. Therefore, I have recommended a surgical procedure, specifically a subcutaneous anterior transposition of the ulnar nerve at the left elbow. The procedure was discussed with the patient, as were the potential risks (including bleeding, infection, nerve and/or blood vessel injury, persistent or recurrent pain/paresthesias, weakness of grip, need for further surgery, blood clots, strokes, heart attacks and/or arhythmias, pneumonia, etc.) and benefits. The patient states his/her understanding and wishes to proceed. All of the patient's questions and concerns were answered. She can call any time with further concerns. She will follow up post-surgery, routine.    H&P reviewed and patient re-examined. No changes.

## 2023-08-13 NOTE — Discharge Instructions (Addendum)
Orthopedic discharge instructions: Keep dressing dry and intact. Keep hand elevated above heart level. May shower after dressing removed on postop day 4 (Sunday). Cover sutures with Band-Aids after drying off. Apply ice to affected area frequently. Take Voltaren 50 mg BID OR ibuprofen 600 mg TID with meals for 3-5 days, then as necessary. Take ES Tylenol or pain medication as prescribed when needed.  Return for follow-up in 10-14 days or as scheduled.AMBULATORY SURGERY  DISCHARGE INSTRUCTIONS   The drugs that you were given will stay in your system until tomorrow so for the next 24 hours you should not:  Drive an automobile Make any legal decisions Drink any alcoholic beverage   You may resume regular meals tomorrow.  Today it is better to start with liquids and gradually work up to solid foods.  You may eat anything you prefer, but it is better to start with liquids, then soup and crackers, and gradually work up to solid foods.   Please notify your doctor immediately if you have any unusual bleeding, trouble breathing, redness and pain at the surgery site, drainage, fever, or pain not relieved by medication.    Additional Instructions:        Please contact your physician with any problems or Same Day Surgery at [phone], Monday through Friday 6 am to 4 pm, or [facility] number at [phone].

## 2023-08-18 ENCOUNTER — Other Ambulatory Visit: Payer: Self-pay | Admitting: Obstetrics & Gynecology

## 2023-08-27 ENCOUNTER — Ambulatory Visit: Admit: 2023-08-27 | Discharge: 2023-08-27 | Payer: PRIVATE HEALTH INSURANCE

## 2023-08-27 ENCOUNTER — Ambulatory Visit
Admit: 2023-08-27 | Discharge: 2023-08-27 | Payer: PRIVATE HEALTH INSURANCE | Attending: Anesthesiology | Primary: Anesthesiology

## 2023-08-27 DIAGNOSIS — N898 Other specified noninflammatory disorders of vagina: Principal | ICD-10-CM

## 2023-08-27 DIAGNOSIS — N390 Urinary tract infection, site not specified: Principal | ICD-10-CM

## 2023-08-27 DIAGNOSIS — R319 Hematuria, unspecified: Principal | ICD-10-CM

## 2023-08-27 DIAGNOSIS — Q796 Ehlers-Danlos syndrome, unspecified: Principal | ICD-10-CM

## 2023-08-27 DIAGNOSIS — G894 Chronic pain syndrome: Principal | ICD-10-CM

## 2023-08-27 DIAGNOSIS — R300 Dysuria: Principal | ICD-10-CM

## 2023-08-27 DIAGNOSIS — G588 Other specified mononeuropathies: Principal | ICD-10-CM

## 2023-08-27 DIAGNOSIS — G540 Brachial plexus disorders: Principal | ICD-10-CM

## 2023-08-27 MED ORDER — CIPROFLOXACIN 500 MG TABLET
ORAL_TABLET | Freq: Two times a day (BID) | ORAL | 0 refills | 7 days | Status: CP
Start: 2023-08-27 — End: 2023-09-03

## 2023-08-27 MED ORDER — FLUCONAZOLE 150 MG TABLET
ORAL_TABLET | 2 refills | 0 days | Status: CP
Start: 2023-08-27 — End: ?

## 2023-08-27 MED ORDER — METRONIDAZOLE 500 MG TABLET
ORAL_TABLET | Freq: Two times a day (BID) | ORAL | 0 refills | 7 days | Status: CP
Start: 2023-08-27 — End: 2023-09-03

## 2023-08-29 ENCOUNTER — Ambulatory Visit
Admit: 2023-08-29 | Discharge: 2023-08-30 | Payer: PRIVATE HEALTH INSURANCE | Attending: Anesthesiology | Primary: Anesthesiology

## 2023-08-29 DIAGNOSIS — G588 Other specified mononeuropathies: Principal | ICD-10-CM

## 2023-08-30 ENCOUNTER — Other Ambulatory Visit: Payer: Self-pay | Admitting: Obstetrics & Gynecology

## 2023-09-03 ENCOUNTER — Other Ambulatory Visit: Payer: Self-pay | Admitting: Obstetrics & Gynecology

## 2023-09-16 ENCOUNTER — Ambulatory Visit: Admit: 2023-09-16 | Discharge: 2023-09-17 | Payer: MEDICARE

## 2023-09-16 DIAGNOSIS — G588 Other specified mononeuropathies: Principal | ICD-10-CM

## 2023-09-18 ENCOUNTER — Ambulatory Visit
Admit: 2023-09-18 | Discharge: 2023-09-19 | Payer: MEDICARE | Attending: Student in an Organized Health Care Education/Training Program | Primary: Student in an Organized Health Care Education/Training Program

## 2023-09-18 DIAGNOSIS — Z01818 Encounter for other preprocedural examination: Principal | ICD-10-CM

## 2023-09-18 DIAGNOSIS — G588 Other specified mononeuropathies: Principal | ICD-10-CM

## 2023-09-23 ENCOUNTER — Ambulatory Visit: Admit: 2023-09-23 | Discharge: 2023-09-23 | Payer: MEDICARE | Attending: Family | Primary: Family

## 2023-09-23 ENCOUNTER — Ambulatory Visit: Admit: 2023-09-23 | Discharge: 2023-09-23 | Payer: MEDICARE

## 2023-09-23 DIAGNOSIS — Z01818 Encounter for other preprocedural examination: Principal | ICD-10-CM

## 2023-09-23 DIAGNOSIS — G43119 Migraine with aura, intractable, without status migrainosus: Principal | ICD-10-CM

## 2023-09-23 DIAGNOSIS — G588 Other specified mononeuropathies: Principal | ICD-10-CM

## 2023-09-25 ENCOUNTER — Encounter: Payer: Self-pay | Admitting: Obstetrics & Gynecology

## 2023-09-25 ENCOUNTER — Ambulatory Visit (INDEPENDENT_AMBULATORY_CARE_PROVIDER_SITE_OTHER): Payer: BC Managed Care – PPO | Admitting: Obstetrics & Gynecology

## 2023-09-25 VITALS — BP 125/83 | HR 102 | Ht 63.0 in | Wt 163.0 lb

## 2023-09-25 DIAGNOSIS — N952 Postmenopausal atrophic vaginitis: Secondary | ICD-10-CM

## 2023-09-25 DIAGNOSIS — N9069 Other specified hypertrophy of vulva: Secondary | ICD-10-CM | POA: Diagnosis not present

## 2023-09-25 NOTE — Progress Notes (Unsigned)
Chief Complaint  Patient presents with   ?prolapse      37 y.o. [id number] Patient's last menstrual period was 10/23/2016 (approximate). The current method of family planning is {contraception:315051}.  Outpatient Encounter Medications as of 09/25/2023  Medication Sig Note   acetaminophen (TYLENOL) 500 MG tablet Take 1,000 mg by mouth every 6 (six) hours as needed for moderate pain.    betamethasone dipropionate 0.05 % cream Apply 1 Application topically 2 (two) times daily as needed (irritation).    botulinum toxin Type A (BOTOX) 200 units injection Inject 200 Units as directed every 3 (three) months.    buPROPion (WELLBUTRIN XL) 300 MG 24 hr tablet Take 1 tablet by mouth daily.    calcium carbonate (TUMS EX) 750 MG chewable tablet Chew 1 tablet by mouth as needed for heartburn.    cholecalciferol (VITAMIN D3) 25 MCG (1000 UNIT) tablet Take 1,000 Units by mouth daily.    clobetasol cream (TEMOVATE) 0.05 % Apply 1 Application topically daily as needed (irritation).    desonide (DESOWEN) 0.05 % ointment Apply 1 application  topically 2 (two) times daily as needed (irritation).    diclofenac (VOLTAREN) 50 MG EC tablet Take 2 tablets by mouth twice daily.    estradiol (ESTRACE) 0.1 MG/GM vaginal cream 1 gram at bedtime    levothyroxine (SYNTHROID) 88 MCG tablet Take 88 mcg by mouth daily before breakfast.    lidocaine (LIDODERM) 5 % Place 1-3 patches onto the skin daily as needed (pain).    methylphenidate 10 MG ER tablet TAKE ONE TABLET BY MOUTH IN THE MORNING    Multiple Vitamin (MULTIVITAMIN WITH MINERALS) TABS tablet Take 1 tablet by mouth daily.    nitrofurantoin, macrocrystal-monohydrate, (MACROBID) 100 MG capsule Take 1 capsule (100 mg total) by mouth daily as needed (postcoital).    nystatin cream (MYCOSTATIN) Apply 1 Application topically 2 (two) times daily as needed for dry skin (yeast).    pregabalin (LYRICA) 100 MG capsule Take 1 capsule (100 mg total) by mouth 2 (two)  times daily.    PREMARIN vaginal cream Insert 1 gram vaginally nightly.    promethazine (PHENERGAN) 25 MG suppository Place 1 suppository (25 mg total) rectally every 8 (eight) hours as needed for vomiting or nausea.    QULIPTA 30 MG TABS Take 1 tablet by mouth daily. 60 mg    Semaglutide-Weight Management 1 MG/0.5ML SOAJ Inject 1 mg into the skin once a week. Saturday    Guselkumab (TREMFYA) 100 MG/ML SOPN Inject 1 Dose into the skin every 30 (thirty) days. (Patient not taking: Reported on 09/25/2023) 04/17/2023: On hold for procedure    No facility-administered encounter medications on file as of 09/25/2023.    Subjective *** Past Medical History:  Diagnosis Date   Allergic rhinitis    Anemia    Arthritis    Asthma    last used inhaler 1 month ago   Complication of anesthesia    if given fentanyl has severe itching   Cubital tunnel syndrome on left 07/2023   Depression    Dysphagia    Dysrhythmia    SVT   Ehlers-Danlos syndrome    Female bladder prolapse    Gastritis    GERD (gastroesophageal reflux disease)    with pregnancy only   Hashimoto's thyroiditis    Hypothyroidism    Lupus    Mast cell activation syndrome (HCC)    Migraines    Osteoarthritis    Pelvic pain in antepartum period in  second trimester 01/17/2014   Pneumonia    POTS (postural orthostatic tachycardia syndrome)    Pregnant 08/01/2015   Psoriatic arthritis (HCC)    PSVT (paroxysmal supraventricular tachycardia) (HCC)    could not be confirmed with 2016 study   Rectocele    Sjogren's syndrome (HCC)    Thoracic outlet syndrome    Thyroid disease    Urethral prolapse    Uterine prolapse    Vaginal Pap smear, abnormal     Past Surgical History:  Procedure Laterality Date   ANKLE SURGERY Bilateral    2023 L ankle debridement and internal brace 2024 R ankle debridement and internal brace   ANTERIOR AND POSTERIOR VAGINAL REPAIR     ANTERIOR INTEROSSEOUS NERVE DECOMPRESSION Left 08/13/2023    Procedure: SUBCUTANEOUS ANTERIOR TRANSPOSITION OF ULNAR NERVE AT LEFT ELBOW;  Surgeon: Christena Flake, MD;  Location: [facility] ORS;  Service: Orthopedics;  Laterality: Left;   BREAST REDUCTION SURGERY Bilateral 08/20/2022   Procedure: MAMMARY REDUCTION  (BREAST) with Liposuction;  Surgeon: Janne Napoleon, MD;  Location: [facility] OR;  Service: Plastics;  Laterality: Bilateral;  3 hours   COLONOSCOPY WITH ESOPHAGOGASTRODUODENOSCOPY (EGD)  12/26/2022   CYSTOCELE REPAIR N/A 09/17/2017   Procedure: ANTERIOR REPAIR (CYSTOCELE);  Surgeon: Lazaro Arms, MD;  Location: [facility];  Service: Gynecology;  Laterality: N/A;   KNEE SURGERY Left 11/2021   KNEE SURGERY Right 2023   LIPOSUCTION  03/2022   PANNICULECTOMY N/A 04/28/2023   Procedure: PANNICULECTOMY;  Surgeon: Santiago Glad, MD;  Location: [facility] OR;  Service: Plastics;  Laterality: N/A;   SUPRAVENTRICULAR TACHYCARDIA ABLATION  01/05/2015   unsuccessful   SUPRAVENTRICULAR TACHYCARDIA ABLATION N/A 01/05/2015   Procedure: SUPRAVENTRICULAR TACHYCARDIA ABLATION;  Surgeon: Marinus Maw, MD;  Location: [facility] CATH LAB;  Service: Cardiovascular;  Laterality: N/A;   Thoracic outlet decompression  2024   VAGINAL HYSTERECTOMY N/A 09/17/2017   Procedure: HYSTERECTOMY VAGINAL WITH POSSIBLE BILATERAL SALPINGECTOMY;  Surgeon: Lazaro Arms, MD;  Location: [facility];  Service: Gynecology;  Laterality: N/A;   WRIST GANGLION EXCISION Left    x2    OB History     Gravida  6   Para  6   Term  6   Preterm      AB      Living  6      SAB      IAB      Ectopic      Multiple  0   Live Births  6           Allergies  Allergen Reactions   Cosentyx [Secukinumab] Anaphylaxis   Plaquenil [Hydroxychloroquine] Other (See Comments)    Prolonged QT   Fentanyl Itching   Latex Dermatitis   Otezla [Apremilast]     Mood changes    Zetia [Ezetimibe] Nausea And Vomiting   Zofran [Ondansetron Hcl]     Prolonged QT   Tape Rash    Use paper tape    Social  History   Socioeconomic History   Marital status: Married    Spouse name: Reuel Boom   Number of children: 6   Years of education: HS   Highest education level: Not on file  Occupational History   Occupation: Web designer  Tobacco Use   Smoking status: Never   Smokeless tobacco: Never  Vaping Use   Vaping status: Never Used  Substance and Sexual Activity   Alcohol use: No   Drug use: No   Sexual activity: Yes  Birth control/protection: Surgical    Comment: hyst  Other Topics Concern   Not on file  Social History Narrative   Lives with husband   Right handed    Caffeine use: 1-2 diet cokes per day   Social Determinants of Health   Financial Resource Strain: Low Risk  (05/14/2023)   Received from [facility], [facility]   Overall Financial Resource Strain (CARDIA)    Difficulty of Paying Living Expenses: Not hard at all  Food Insecurity: No Food Insecurity (05/14/2023)   Received from [facility], [facility]   Hunger Vital Sign    Worried About Running Out of Food in the Last Year: Never true    Ran Out of Food in the Last Year: Never true  Transportation Needs: No Transportation Needs (05/14/2023)   Received from [facility], [facility]   [facility] - Transportation    Lack of Transportation (Medical): No    Lack of Transportation (Non-Medical): No  Physical Activity: Inactive (09/11/2021)   Received from [facility], [facility]   Exercise Vital Sign    Days of Exercise per Week: 0 days    Minutes of Exercise per Session: 0 min  Stress: Stress Concern Present (09/11/2021)   Received from [facility], [facility] - Occupational Stress Questionnaire    Feeling of Stress : To some extent  Social Connections: Moderately Isolated (09/11/2021)   Received from [facility], [facility]   Social Connection and Isolation Panel [NHANES]    Frequency of Communication  with Friends and Family: Three times a week    Frequency of Social Gatherings with Friends and Family: Never    Attends Religious Services: Never    Database administrator or Organizations: No    Attends Engineer, structural: Never    Marital Status: Married    Family History  Problem Relation Age of Onset   Hypertension Mother    Hyperlipidemia Mother    Multiple sclerosis Mother    COPD Mother    Cancer Mother        melanoma x 2   Diabetes Mother    Heart disease Father        PSVT   Hyperlipidemia Brother    Hypertension Brother    Heart disease Maternal Grandfather        heart attack   Cancer Paternal Grandmother        pancreatic cancer   Heart disease Paternal Grandmother        MI   Hyperlipidemia Paternal Grandmother    Hypertension Paternal Grandmother    Asthma Daughter    Asthma Son    Autism Son    ADD / ADHD Son    Tourette syndrome Son    Other Son        Neurological and mental issues   ADD / ADHD Son     Medications:       Current Outpatient Medications:    acetaminophen (TYLENOL) 500 MG tablet, Take 1,000 mg by mouth every 6 (six) hours as needed for moderate pain., Disp: , Rfl:    betamethasone dipropionate 0.05 % cream, Apply 1 Application topically 2 (two) times daily as needed (irritation)., Disp: , Rfl:    botulinum toxin Type A (BOTOX) 200 units injection, Inject 200 Units as directed every 3 (three) months., Disp: , Rfl:    buPROPion (WELLBUTRIN XL)  300 MG 24 hr tablet, Take 1 tablet by mouth daily., Disp: 90 tablet, Rfl: 2   calcium carbonate (TUMS EX) 750 MG chewable tablet, Chew 1 tablet by mouth as needed for heartburn., Disp: , Rfl:    cholecalciferol (VITAMIN D3) 25 MCG (1000 UNIT) tablet, Take 1,000 Units by mouth daily., Disp: , Rfl:    clobetasol cream (TEMOVATE) 0.05 %, Apply 1 Application topically daily as needed (irritation)., Disp: , Rfl:    desonide (DESOWEN) 0.05 % ointment, Apply 1 application  topically 2 (two) times  daily as needed (irritation)., Disp: , Rfl:    diclofenac (VOLTAREN) 50 MG EC tablet, Take 2 tablets by mouth twice daily., Disp: 360 tablet, Rfl: 2   estradiol (ESTRACE) 0.1 MG/GM vaginal cream, 1 gram at bedtime, Disp: 30 g, Rfl: 12   levothyroxine (SYNTHROID) 88 MCG tablet, Take 88 mcg by mouth daily before breakfast., Disp: , Rfl:    lidocaine (LIDODERM) 5 %, Place 1-3 patches onto the skin daily as needed (pain)., Disp: , Rfl:    methylphenidate 10 MG ER tablet, TAKE ONE TABLET BY MOUTH IN THE MORNING, Disp: 30 tablet, Rfl: 0   Multiple Vitamin (MULTIVITAMIN WITH MINERALS) TABS tablet, Take 1 tablet by mouth daily., Disp: , Rfl:    nitrofurantoin, macrocrystal-monohydrate, (MACROBID) 100 MG capsule, Take 1 capsule (100 mg total) by mouth daily as needed (postcoital)., Disp: , Rfl:    nystatin cream (MYCOSTATIN), Apply 1 Application topically 2 (two) times daily as needed for dry skin (yeast)., Disp: , Rfl:    pregabalin (LYRICA) 100 MG capsule, Take 1 capsule (100 mg total) by mouth 2 (two) times daily., Disp: 180 capsule, Rfl: 3   PREMARIN vaginal cream, Insert 1 gram vaginally nightly., Disp: 90 g, Rfl: 2   promethazine (PHENERGAN) 25 MG suppository, Place 1 suppository (25 mg total) rectally every 8 (eight) hours as needed for vomiting or nausea., Disp: 12 each, Rfl: 11   QULIPTA 30 MG TABS, Take 1 tablet by mouth daily. 60 mg, Disp: , Rfl:    Semaglutide-Weight Management 1 MG/0.5ML SOAJ, Inject 1 mg into the skin once a week. Saturday, Disp: , Rfl:    Guselkumab (TREMFYA) 100 MG/ML SOPN, Inject 1 Dose into the skin every 30 (thirty) days. (Patient not taking: Reported on 09/25/2023), Disp: , Rfl:   Objective Blood pressure 125/83, pulse (!) 102, height 5\' 3"  (1.6 m), weight 163 lb (73.9 kg), last menstrual period 10/23/2016.  ***  Pertinent ROS ***  Labs or studies ***    Impression + Management Plan: Diagnoses this Encounter:: No diagnosis found.    Medications prescribed  during  this encounter: No orders of the defined types were placed in this encounter.   Labs or Scans Ordered during this encounter: No orders of the defined types were placed in this encounter.     Follow up No follow-ups on file.

## 2023-09-30 DIAGNOSIS — G43119 Migraine with aura, intractable, without status migrainosus: Principal | ICD-10-CM

## 2023-10-13 ENCOUNTER — Encounter: Admit: 2023-10-13 | Discharge: 2023-10-13 | Payer: MEDICARE | Attending: Anesthesiology | Primary: Anesthesiology

## 2023-10-13 ENCOUNTER — Ambulatory Visit: Admit: 2023-10-13 | Discharge: 2023-10-13 | Payer: MEDICARE

## 2023-10-13 MED ORDER — OXYCODONE 5 MG TABLET
ORAL_TABLET | ORAL | 0 refills | 1 days | Status: CP | PRN
Start: 2023-10-13 — End: 2023-10-18

## 2023-10-14 MED ORDER — OXYCODONE 5 MG TABLET
ORAL_TABLET | ORAL | 0 refills | 7 days | Status: CP | PRN
Start: 2023-10-14 — End: 2023-10-21

## 2023-10-26 ENCOUNTER — Other Ambulatory Visit: Payer: Self-pay | Admitting: Medical Genetics

## 2023-10-26 DIAGNOSIS — Z006 Encounter for examination for normal comparison and control in clinical research program: Secondary | ICD-10-CM

## 2023-10-31 ENCOUNTER — Ambulatory Visit
Admit: 2023-10-31 | Discharge: 2023-11-01 | Payer: BLUE CROSS/BLUE SHIELD | Attending: Student in an Organized Health Care Education/Training Program | Primary: Student in an Organized Health Care Education/Training Program

## 2023-10-31 DIAGNOSIS — G509 Disorder of trigeminal nerve, unspecified: Principal | ICD-10-CM

## 2023-11-03 ENCOUNTER — Other Ambulatory Visit (HOSPITAL_COMMUNITY)
Admission: RE | Admit: 2023-11-03 | Discharge: 2023-11-03 | Disposition: A | Discharge status: Home / Self Care | Payer: Medicare Other | Source: Ambulatory Visit | Attending: Medical Genetics | Admitting: Medical Genetics

## 2023-11-03 ENCOUNTER — Other Ambulatory Visit (HOSPITAL_COMMUNITY): Payer: Medicare Other

## 2023-11-03 DIAGNOSIS — Z006 Encounter for examination for normal comparison and control in clinical research program: Secondary | ICD-10-CM

## 2023-11-07 DIAGNOSIS — G540 Brachial plexus disorders: Principal | ICD-10-CM

## 2023-11-11 LAB — HELIX MOLECULAR SCREEN: Genetic Analysis Overall Interpretation: NEGATIVE

## 2023-11-11 LAB — GENECONNECT MOLECULAR SCREEN

## 2023-12-21 DIAGNOSIS — G43119 Migraine with aura, intractable, without status migrainosus: Principal | ICD-10-CM

## 2023-12-23 ENCOUNTER — Ambulatory Visit: Admit: 2023-12-23 | Discharge: 2023-12-24 | Payer: BLUE CROSS/BLUE SHIELD

## 2023-12-23 DIAGNOSIS — M5481 Occipital neuralgia: Principal | ICD-10-CM

## 2023-12-23 DIAGNOSIS — G509 Disorder of trigeminal nerve, unspecified: Principal | ICD-10-CM

## 2023-12-25 ENCOUNTER — Emergency Department
Admit: 2023-12-25 | Discharge: 2023-12-25 | Disposition: A | Discharge status: Home / Self Care | Payer: BLUE CROSS/BLUE SHIELD | Attending: Emergency Medicine

## 2023-12-25 MED ORDER — PREDNISONE 20 MG TABLET
ORAL_TABLET | Freq: Every day | ORAL | 0 refills | 5.00 days | Status: CP
Start: 2023-12-25 — End: 2023-12-30

## 2023-12-29 ENCOUNTER — Encounter (HOSPITAL_COMMUNITY): Payer: Self-pay | Admitting: Emergency Medicine

## 2023-12-29 ENCOUNTER — Emergency Department (HOSPITAL_COMMUNITY)
Admission: EM | Admit: 2023-12-29 | Discharge: 2023-12-29 | Disposition: A | Discharge status: Home / Self Care | Payer: BC Managed Care – PPO | Attending: Student | Admitting: Student

## 2023-12-29 ENCOUNTER — Emergency Department (HOSPITAL_COMMUNITY): Payer: BC Managed Care – PPO

## 2023-12-29 ENCOUNTER — Other Ambulatory Visit: Payer: Self-pay

## 2023-12-29 DIAGNOSIS — J45909 Unspecified asthma, uncomplicated: Secondary | ICD-10-CM | POA: Diagnosis not present

## 2023-12-29 DIAGNOSIS — K8020 Calculus of gallbladder without cholecystitis without obstruction: Secondary | ICD-10-CM | POA: Diagnosis not present

## 2023-12-29 DIAGNOSIS — Z91040 Latex allergy status: Secondary | ICD-10-CM | POA: Insufficient documentation

## 2023-12-29 DIAGNOSIS — R1010 Upper abdominal pain, unspecified: Secondary | ICD-10-CM | POA: Diagnosis present

## 2023-12-29 LAB — CBC
HCT: 39.2 % (ref 36.0–46.0)
Hemoglobin: 12.9 g/dL (ref 12.0–15.0)
MCH: 28 pg (ref 26.0–34.0)
MCHC: 32.9 g/dL (ref 30.0–36.0)
MCV: 85 fL (ref 80.0–100.0)
Platelets: 278 10*3/uL (ref 150–400)
RBC: 4.61 MIL/uL (ref 3.87–5.11)
RDW: 13.2 % (ref 11.5–15.5)
WBC: 7.6 10*3/uL (ref 4.0–10.5)
nRBC: 0 % (ref 0.0–0.2)

## 2023-12-29 LAB — COMPREHENSIVE METABOLIC PANEL WITH GFR
ALT: 12 U/L (ref 0–44)
AST: 11 U/L — ABNORMAL LOW (ref 15–41)
Albumin: 4.1 g/dL (ref 3.5–5.0)
Alkaline Phosphatase: 66 U/L (ref 38–126)
Anion gap: 7 (ref 5–15)
BUN: 11 mg/dL (ref 6–20)
CO2: 27 mmol/L (ref 22–32)
Calcium: 9.7 mg/dL (ref 8.9–10.3)
Chloride: 103 mmol/L (ref 98–111)
Creatinine, Ser: 0.69 mg/dL (ref 0.44–1.00)
GFR, Estimated: >60 mL/min (ref >60)
Glucose, Bld: 115 mg/dL — ABNORMAL HIGH (ref 70–99)
Potassium: 4 mmol/L (ref 3.5–5.1)
Sodium: 137 mmol/L (ref 135–145)
Total Bilirubin: <0.2 mg/dL (ref 0.0–1.2)
Total Protein: 7 g/dL (ref 6.5–8.1)

## 2023-12-29 LAB — URINALYSIS, ROUTINE W REFLEX MICROSCOPIC
Bilirubin Urine: NEGATIVE
Glucose, UA: NEGATIVE mg/dL
Hgb urine dipstick: NEGATIVE
Ketones, ur: NEGATIVE mg/dL
Leukocytes,Ua: NEGATIVE
Nitrite: NEGATIVE
Protein, ur: NEGATIVE mg/dL
Specific Gravity, Urine: 1.012 (ref 1.005–1.030)
pH: 5 (ref 5.0–8.0)

## 2023-12-29 LAB — LIPASE, BLOOD: Lipase: 37 U/L (ref 11–51)

## 2023-12-29 MED ORDER — OXYCODONE HCL 5 MG PO TABS: 2.5000 mg | ORAL_TABLET | Freq: Four times a day (QID) | ORAL | 0 refills | Status: DC | PRN

## 2023-12-29 MED ORDER — METOCLOPRAMIDE HCL 10 MG PO TABS
10.0000 mg | ORAL_TABLET | Freq: Once | ORAL | Status: AC
  Administered 2023-12-29: 10 mg via ORAL
  Filled 2023-12-29: qty 1

## 2023-12-29 MED ORDER — METOCLOPRAMIDE HCL 10 MG PO TABS
10.0000 mg | ORAL_TABLET | Freq: Four times a day (QID) | ORAL | 0 refills | Status: DC | PRN
Start: 2023-12-29 — End: 2024-05-23

## 2023-12-29 MED ORDER — PANTOPRAZOLE SODIUM 40 MG IV SOLR
40.0000 mg | Freq: Once | INTRAVENOUS | Status: AC
  Administered 2023-12-29: 40 mg via INTRAVENOUS
  Filled 2023-12-29: qty 10

## 2023-12-29 NOTE — ED Triage Notes (Signed)
 Pt c/o upper R abd pain that radiates to mid back that started lastnight, states she took tums and omeprazole 40mg  without relief. Hx of peptic ulcers. States pain is worse when she eats

## 2023-12-29 NOTE — ED Provider Notes (Signed)
 [place] EMERGENCY DEPARTMENT AT [facility] Provider Note   CSN: [id number] Arrival date & time: 12/29/23  0720     History  Chief Complaint  Patient presents with   Abdominal Pain    Sharon Rowland is a 38 y.o. female.  HPI   38 year old female presents to the emergency department with complaints of abdominal pain.  Patient reports upper abdominal pain beginning around 930 yesterday evening around 20 minutes or so after eating dinner.  Reports pain in upper middle/right upper abdomen with some radiation to her right shoulder blade.  States the pain has been constant since around 30 minutes or so ago.  Has been trying muscle relaxers, omeprazole , Maalox without significant improvement of symptoms at home.  States that she has a history of chronic gastritis, gastroparesis and felt like it was related to one of the to said diagnoses.  Given that symptoms did not improve with at home medications, prompted visit to the emergency department.  Patient states that she is currently being treated for a lupus flareup on 20 mg of prednisone  that she has been on for the past 4 days or so.  She is concerned that the prednisone  may have been irritating to her stomach.  Reports nausea with no emesis.  Denies any fever, urinary symptoms, vaginal symptoms, change in bowel habits.   Past medical history significant for paroxysmal SVT, asthma, recurrent UTI, uterine/bladder prolapse urinary retention, panniculectomy  Home Medications Prior to Admission medications   Medication Sig Start Date End Date Taking? Authorizing Provider  metoCLOPramide  (REGLAN ) 10 MG tablet Take 1 tablet (10 mg total) by mouth every 6 (six) hours as needed for nausea. 12/29/23  Yes Silver Fell A, PA  oxyCODONE  (ROXICODONE ) 5 MG immediate release tablet Take 0.5 tablets (2.5 mg total) by mouth every 6 (six) hours as needed for severe pain (pain score 7-10). 12/29/23  Yes Silver Fell A, PA  acetaminophen  (TYLENOL )  500 MG tablet Take 1,000 mg by mouth every 6 (six) hours as needed for moderate pain.    [provider]  betamethasone dipropionate 0.05 % cream Apply 1 Application topically 2 (two) times daily as needed (irritation).    [provider]  botulinum toxin Type A (BOTOX) 200 units injection Inject 200 Units as directed every 3 (three) months.    [provider]  buPROPion  (WELLBUTRIN  XL) 300 MG 24 hr tablet Take 1 tablet by mouth daily. 09/05/23   Jayne Vonn DEL, MD  calcium  carbonate (TUMS EX) 750 MG chewable tablet Chew 1 tablet by mouth as needed for heartburn.    [provider]  cholecalciferol (VITAMIN D3) 25 MCG (1000 UNIT) tablet Take 1,000 Units by mouth daily.    [provider]  clobetasol cream (TEMOVATE) 0.05 % Apply 1 Application topically daily as needed (irritation).    [provider]  desonide (DESOWEN) 0.05 % ointment Apply 1 application  topically 2 (two) times daily as needed (irritation). 04/24/20   [provider]  diclofenac  (VOLTAREN ) 50 MG EC tablet Take 2 tablets by mouth twice daily. 09/05/23   Jayne Vonn DEL, MD  estradiol  (ESTRACE ) 0.1 MG/GM vaginal cream 1 gram at bedtime 01/13/23   Jayne Vonn DEL, MD  Guselkumab (TREMFYA) 100 MG/ML SOPN Inject 1 Dose into the skin every 30 (thirty) days. Patient not taking: Reported on 09/25/2023    [provider]  levothyroxine  (SYNTHROID ) 88 MCG tablet Take 88 mcg by mouth daily before breakfast. 10/28/22 10/28/23  [provider]  lidocaine  (LIDODERM ) 5 % Place 1-3 patches onto the skin daily as needed (pain). 04/19/22   [provider]  methylphenidate  10 MG ER tablet TAKE ONE TABLET BY MOUTH IN THE MORNING 04/18/23   Jayne Vonn DEL, MD  Multiple Vitamin (MULTIVITAMIN WITH MINERALS) TABS tablet Take 1 tablet by mouth daily.    [provider]  nitrofurantoin , macrocrystal-monohydrate, (MACROBID ) 100 MG capsule Take 1 capsule (100 mg total) by  mouth daily as needed (postcoital). 08/13/23   Poggi, Norleen PARAS, MD  nystatin cream (MYCOSTATIN) Apply 1 Application topically 2 (two) times daily as needed for dry skin (yeast).    [provider]  pregabalin  (LYRICA ) 100 MG capsule Take 1 capsule (100 mg total) by mouth 2 (two) times daily. 10/23/22   Jayne Vonn DEL, MD  PREMARIN  vaginal cream Insert 1 gram vaginally nightly. 09/05/23   Jayne Vonn DEL, MD  promethazine  (PHENERGAN ) 25 MG suppository Place 1 suppository (25 mg total) rectally every 8 (eight) hours as needed for vomiting or nausea. 02/13/23   Jayne Vonn DEL, MD  QULIPTA  30 MG TABS Take 1 tablet by mouth daily. 60 mg 05/05/23   [provider]  Semaglutide-Weight Management 1 MG/0.5ML SOAJ Inject 1 mg into the skin once a week. Saturday    [provider]      Allergies    Cosentyx [secukinumab], Plaquenil [hydroxychloroquine], Fentanyl , Latex, Otezla [apremilast], Zetia [ezetimibe], Zofran  [ondansetron  hcl], and Tape    Review of Systems   Review of Systems  All other systems reviewed and are negative.   Physical Exam Updated Vital Signs BP 120/82 (BP Location: Right Arm)   Pulse 90   Temp 97.7 F (36.5 C) (Oral)   Resp 20   Ht 5' 3 (1.6 m)   Wt 72.6 kg   LMP 10/23/2016 (Approximate)   SpO2 99%   BMI 28.34 kg/m  Physical Exam Vitals and nursing note reviewed.  Constitutional:      General: She is not in acute distress.    Appearance: She is well-developed.  HENT:     Head: Normocephalic and atraumatic.  Eyes:     Conjunctiva/sclera: Conjunctivae normal.  Cardiovascular:     Rate and Rhythm: Normal rate and regular rhythm.     Heart sounds: No murmur heard. Pulmonary:     Effort: Pulmonary effort is normal. No respiratory distress.     Breath sounds: Normal breath sounds. No wheezing, rhonchi or rales.  Abdominal:     Palpations: Abdomen is soft.     Tenderness: There is abdominal tenderness in the right upper quadrant and epigastric  area. There is no right CVA tenderness, left CVA tenderness or guarding. Negative signs include McBurney's sign.  Musculoskeletal:        General: No swelling.     Cervical back: Neck supple.  Skin:    General: Skin is warm and dry.     Capillary Refill: Capillary refill takes less than 2 seconds.  Neurological:     Mental Status: She is alert.  Psychiatric:        Mood and Affect: Mood normal.     ED Results / Procedures / Treatments   Labs (all labs ordered are listed, but only abnormal results are displayed) Labs Reviewed  COMPREHENSIVE METABOLIC PANEL - Abnormal; Notable for the following components:      Result Value   Glucose, Bld 115 (*)    AST 11 (*)    All other components within normal  limits  CBC  LIPASE, BLOOD  URINALYSIS, ROUTINE W REFLEX MICROSCOPIC  POC URINE PREG, ED    EKG None  Radiology US  Abdomen Limited RUQ (LIVER/GB) Result Date: 12/29/2023 CLINICAL DATA:  Ulna upper quadrant pain for 2 days. EXAM: ULTRASOUND ABDOMEN LIMITED RIGHT UPPER QUADRANT COMPARISON:  CT, 04/28/2020. FINDINGS: Gallbladder: Moderately distended. Positive gallstones, largest noted in the gallbladder neck, non mobile, measuring 1.3 cm. No wall thickening or pericholecystic fluid. Patient reportedly is tender to transducer pressure over the gallbladder. Common bile duct: Diameter: 4 mm Liver: No focal lesion identified. Within normal limits in parenchymal echogenicity. Portal vein is patent on color Doppler imaging with normal direction of blood flow towards the liver. Other: None. IMPRESSION: 1. Gallstones, without convincing evidence of acute cholecystitis. 2. No other abnormalities. Electronically Signed   By: Alm Parkins M.D.   On: 12/29/2023 09:28    Procedures Procedures    Medications Ordered in ED Medications  pantoprazole  (PROTONIX ) injection 40 mg (40 mg Intravenous Given 12/29/23 1007)  metoCLOPramide  (REGLAN ) tablet 10 mg (10 mg Oral Given 12/29/23 1007)    ED Course/  Medical Decision Making/ A&P Clinical Course as of 12/29/23 1115  Mon Dec 29, 2023  1106 Consulted general surgery Dr. Evonnie regarding the patient who agreed with outpatient management with strict return precautions [CR]    Clinical Course User Index [CR] Silver Wonda LABOR, PA                                 Medical Decision Making  This patient presents to the ED for concern of abdominal pain, this involves an extensive number of treatment options, and is a complaint that carries with it a high risk of complications and morbidity.  The differential diagnosis includes gastritis, PUD, cholecystitis, CBD pathology, SBO/LBO, volvulus, diverticulitis, appendicitis, pyelonephritis, nephrolithiasis, cystitis, PID, tubo-ovarian abscess, ectopic pregnancy, other   Co morbidities that complicate the patient evaluation  See HPI   Additional history obtained:  Additional history obtained from EMR External records from outside source obtained and reviewed including hospital records   Lab Tests:  I Ordered, and personally interpreted labs.  The pertinent results include: No leukocytosis.  No evidence of anemia.  Platelets within range.  No Electra abnormalities.  No transaminitis.  No renal dysfunction.  Lipase within normal limits.  Urine without abnormality.   Imaging Studies ordered:  I ordered imaging studies including viral quadrant ultrasound I independently visualized and interpreted imaging which showed normal without evidence of acute cholecystitis I agree with the radiologist interpretation   Cardiac Monitoring: / EKG:  The patient was maintained on a cardiac monitor.  I personally viewed and interpreted the cardiac monitored which showed an underlying rhythm of: Sinus rhythm   Consultations Obtained:  N/a   Problem List / ED Course / Critical interventions / Medication management  Symptomatic cholelithiasis  I ordered medication including Protonix ,  Reglan  Reevaluation of the patient after these medicines showed that the patient improved I have reviewed the patients home medicines and have made adjustments as needed   Social Determinants of Health:  Denies tobacco, licit drug use   Test / Admission - Considered:  Symptomatic cholelithiasis  vitals signs within normal range and stable throughout visit. Laboratory/imaging studies significant for: see above 38 year old female presents emergency department with complaints of right upper abdominal pain with some radiation to her right infrascapular region.  Symptoms again last night.  On exam,  some tenderness epigastric as well as right upper quadrant.  Labs reassuring.  Ultrasound imaging concerning for cholelithiasis.  Repeat abdominal exam did show tenderness even after medicines administered.  Consulted general surgery who recommended outpatient management given patient's reassuring labs and ultrasound imaging without obvious cholecystitis.  Will treat patient's pain as well as nausea and recommend follow-up with general surgery.  Strict return precautions discussed at length.  Treatment plan discussed at length with patient and she acknowledged understanding was agreeable to said plan.  Patient overall well-appearing, afebrile in no acute distress. Worrisome signs and symptoms were discussed with the patient, and the patient acknowledged understanding to return to the ED if noticed. Patient was stable upon discharge.          Final Clinical Impression(s) / ED Diagnoses Final diagnoses:  Symptomatic cholelithiasis    Rx / DC Orders ED Discharge Orders          Ordered    oxyCODONE  (ROXICODONE ) 5 MG immediate release tablet  Every 6 hours PRN        12/29/23 1109    metoCLOPramide  (REGLAN ) 10 MG tablet  Every 6 hours PRN        12/29/23 1109              Silver Wonda LABOR, [place] 12/29/23 1115    Albertina Dixon, MD 12/29/23 2152

## 2023-12-29 NOTE — Discharge Instructions (Addendum)
 Talk to the general surgeon Dr. Evonnie regarding your case who recommended outpatient management.  Will send home with pain medicine as well as nausea medicine to use as needed.  Symptoms concerning for symptomatic cholelithiasis or a gallbladder stone that is intermittently causing symptoms.  Will send you home with pain medicine to use as needed as well as nausea medicine.  Please call number attached to discharge papers to schedule appointment as they are already aware of your visit to the emergency department.  If you develop fever, intractable pain, intractable nausea/vomiting, please return to the emergency department.  Otherwise, they would like to address your gallbladder in a more stable environment than an emergent surgery.

## 2024-01-08 ENCOUNTER — Encounter: Payer: Self-pay | Admitting: Surgery

## 2024-01-08 ENCOUNTER — Ambulatory Visit (INDEPENDENT_AMBULATORY_CARE_PROVIDER_SITE_OTHER): Payer: BC Managed Care – PPO | Admitting: Surgery

## 2024-01-08 VITALS — BP 110/73 | HR 115 | Temp 98.4°F | Resp 14 | Ht 63.0 in | Wt 168.0 lb

## 2024-01-08 DIAGNOSIS — K8020 Calculus of gallbladder without cholecystitis without obstruction: Secondary | ICD-10-CM

## 2024-01-08 NOTE — Progress Notes (Signed)
[facility] History and Physical  Reason for Referral: Cholelithiasis Referring Physician: ER referral  Chief Complaint   New Patient (Initial Visit)     Sharon Rowland is a 38 y.o. female.  HPI: Patient presents for evaluation of cholelithiasis.  For a while she has been having epigastric abdominal pain which she always thought was secondary to her gastroparesis.  The pain comes anytime she has food.  She recently had a severe abdominal pain with associated nausea and vomiting which prompted her to go to the emergency department for evaluation.  At that time she was diagnosed with cholelithiasis without evidence of acute cholecystitis and normal blood work.  She subsequently had another attack last night with nausea and vomiting and went to [facility] for evaluation.  She left AMA, as they gave her medication she was told she does not tolerate well.  It seems that her attacks always come on at night and do not seem to be associated with any particular type of food.  Since being evaluated in the emergency department last night, she states that she is feeling better.  She has an extensive medical history including lupus, Sjogren's, psoriatic arthritis, Ehlers-Danlos syndrome, Hashimoto's, asthma, POTS, GERD,, and thoracic outlet syndrome.  She has undergone numerous surgeries for her various conditions, but her only abdominal surgeries are vaginal hysterectomy and panniculectomy.  She is currently off of her Biologics as she has been undergoing orthopedic surgeries for her Ehlers-Danlos syndrome.  She is also not currently taking any steroids or blood thinning medications.  She denies use of tobacco products, alcohol, and illicit drugs.  Past Medical History:  Diagnosis Date   Allergic rhinitis    Anemia    Arthritis    Asthma    last used inhaler 1 month ago   Complication of anesthesia    if given fentanyl has severe itching   Cubital tunnel syndrome on left 07/2023    Depression    Dysphagia    Dysrhythmia    SVT   Ehlers-Danlos syndrome    Female bladder prolapse    Gastritis    GERD (gastroesophageal reflux disease)    with pregnancy only   Hashimoto's thyroiditis    Hypothyroidism    Lupus    Mast cell activation syndrome (HCC)    Migraines    Osteoarthritis    Pelvic pain in antepartum period in second trimester 01/17/2014   Pneumonia    POTS (postural orthostatic tachycardia syndrome)    Pregnant 08/01/2015   Psoriatic arthritis (HCC)    PSVT (paroxysmal supraventricular tachycardia) (HCC)    could not be confirmed with 2016 study   Rectocele    Sjogren's syndrome (HCC)    Thoracic outlet syndrome    Thyroid disease    Urethral prolapse    Uterine prolapse    Vaginal Pap smear, abnormal     Past Surgical History:  Procedure Laterality Date   ANKLE SURGERY Bilateral    2023 L ankle debridement and internal brace 2024 R ankle debridement and internal brace   ANTERIOR AND POSTERIOR VAGINAL REPAIR     ANTERIOR INTEROSSEOUS NERVE DECOMPRESSION Left 08/13/2023   Procedure: SUBCUTANEOUS ANTERIOR TRANSPOSITION OF ULNAR NERVE AT LEFT ELBOW;  Surgeon: Christena Flake, MD;  Location: [facility] ORS;  Service: Orthopedics;  Laterality: Left;   BREAST REDUCTION SURGERY Bilateral 08/20/2022   Procedure: MAMMARY REDUCTION  (BREAST) with Liposuction;  Surgeon: Janne Napoleon, MD;  Location: [facility] OR;  Service: Plastics;  Laterality: Bilateral;  3 hours  COLONOSCOPY WITH ESOPHAGOGASTRODUODENOSCOPY (EGD)  12/26/2022   CYSTOCELE REPAIR N/A 09/17/2017   Procedure: ANTERIOR REPAIR (CYSTOCELE);  Surgeon: Lazaro Arms, MD;  Location: [facility];  Service: Gynecology;  Laterality: N/A;   KNEE SURGERY Left 11/2021   KNEE SURGERY Right 2023   LIPOSUCTION  03/2022   PANNICULECTOMY N/A 04/28/2023   Procedure: PANNICULECTOMY;  Surgeon: Santiago Glad, MD;  Location: [facility] OR;  Service: Plastics;  Laterality: N/A;   SUPRAVENTRICULAR TACHYCARDIA ABLATION  01/05/2015    unsuccessful   SUPRAVENTRICULAR TACHYCARDIA ABLATION N/A 01/05/2015   Procedure: SUPRAVENTRICULAR TACHYCARDIA ABLATION;  Surgeon: Marinus Maw, MD;  Location: [facility] CATH LAB;  Service: Cardiovascular;  Laterality: N/A;   Thoracic outlet decompression  2024   VAGINAL HYSTERECTOMY N/A 09/17/2017   Procedure: HYSTERECTOMY VAGINAL WITH POSSIBLE BILATERAL SALPINGECTOMY;  Surgeon: Lazaro Arms, MD;  Location: [facility];  Service: Gynecology;  Laterality: N/A;   WRIST GANGLION EXCISION Left    x2    Family History  Problem Relation Age of Onset   Hypertension Mother    Hyperlipidemia Mother    Multiple sclerosis Mother    COPD Mother    Cancer Mother        melanoma x 2   Diabetes Mother    Heart disease Father        PSVT   Hyperlipidemia Brother    Hypertension Brother    Heart disease Maternal Grandfather        heart attack   Cancer Paternal Grandmother        pancreatic cancer   Heart disease Paternal Grandmother        MI   Hyperlipidemia Paternal Grandmother    Hypertension Paternal Grandmother    Asthma Daughter    Asthma Son    Autism Son    ADD / ADHD Son    Tourette syndrome Son    Other Son        Neurological and mental issues   ADD / ADHD Son     Social History   Tobacco Use   Smoking status: Never   Smokeless tobacco: Never  Vaping Use   Vaping status: Never Used  Substance Use Topics   Alcohol use: No   Drug use: No    Medications: I have reviewed the patient's current medications. Allergies as of 01/08/2024       Reactions   Cosentyx [secukinumab] Anaphylaxis   Plaquenil [hydroxychloroquine] Other (See Comments)   Prolonged QT   Fentanyl Itching   Latex Dermatitis   Otezla [apremilast]    Mood changes    Zetia [ezetimibe] Nausea And Vomiting   Zofran [ondansetron Hcl]    Prolonged QT   Tape Rash   Use paper tape        Medication List        Accurate as of January 08, 2024  3:03 PM. If you have any questions, ask your nurse or  doctor.          STOP taking these medications    Semaglutide-Weight Management 1 MG/0.5ML Soaj Stopped by: Stavroula Rohde A Gene Glazebrook       TAKE these medications    acetaminophen 500 MG tablet Commonly known as: TYLENOL Take 1,000 mg by mouth every 6 (six) hours as needed for moderate pain.   betamethasone dipropionate 0.05 % cream Apply 1 Application topically 2 (two) times daily as needed (irritation).   botulinum toxin Type A 200 units injection Commonly known as: BOTOX Inject 200 Units as directed  every 3 (three) months.   buPROPion 300 MG 24 hr tablet Commonly known as: WELLBUTRIN XL Take 1 tablet by mouth daily.   calcium carbonate 750 MG chewable tablet Commonly known as: TUMS EX Chew 1 tablet by mouth as needed for heartburn.   cholecalciferol 25 MCG (1000 UNIT) tablet Commonly known as: VITAMIN D3 Take 1,000 Units by mouth daily.   clobetasol cream 0.05 % Commonly known as: TEMOVATE Apply 1 Application topically daily as needed (irritation).   desonide 0.05 % ointment Commonly known as: DESOWEN Apply 1 application  topically 2 (two) times daily as needed (irritation).   diclofenac 50 MG EC tablet Commonly known as: VOLTAREN Take 2 tablets by mouth twice daily.   estradiol 0.1 MG/GM vaginal cream Commonly known as: ESTRACE 1 gram at bedtime   levothyroxine 88 MCG tablet Commonly known as: SYNTHROID Take 88 mcg by mouth daily before breakfast.   lidocaine 5 % Commonly known as: LIDODERM Place 1-3 patches onto the skin daily as needed (pain).   methylphenidate 10 MG ER tablet TAKE ONE TABLET BY MOUTH IN THE MORNING   metoCLOPramide 10 MG tablet Commonly known as: REGLAN Take 1 tablet (10 mg total) by mouth every 6 (six) hours as needed for nausea.   multivitamin with minerals Tabs tablet Take 1 tablet by mouth daily.   nitrofurantoin (macrocrystal-monohydrate) 100 MG capsule Commonly known as: MACROBID Take 1 capsule (100 mg total) by  mouth daily as needed (postcoital).   nystatin cream Commonly known as: MYCOSTATIN Apply 1 Application topically 2 (two) times daily as needed for dry skin (yeast).   oxyCODONE 5 MG immediate release tablet Commonly known as: Roxicodone Take 0.5 tablets (2.5 mg total) by mouth every 6 (six) hours as needed for severe pain (pain score 7-10).   pregabalin 100 MG capsule Commonly known as: LYRICA Take 1 capsule (100 mg total) by mouth 2 (two) times daily.   Premarin vaginal cream Generic drug: conjugated estrogens Insert 1 gram vaginally nightly.   promethazine 25 MG suppository Commonly known as: PHENERGAN Place 1 suppository (25 mg total) rectally every 8 (eight) hours as needed for vomiting or nausea.   Qulipta 30 MG Tabs Generic drug: Atogepant Take 1 tablet by mouth daily. 60 mg   Tremfya 100 MG/ML pen Generic drug: guselkumab Inject 1 Dose into the skin every 30 (thirty) days.         ROS:  Constitutional: negative for chills, fatigue, and fevers Eyes: positive for visual disturbance and pain Ears, nose, mouth, throat, and face: negative for ear drainage, sore throat, and sinus problems Respiratory: negative for cough, wheezing, and shortness of breath Cardiovascular: negative for chest pain and palpitations Gastrointestinal: positive for abdominal pain, nausea, and reflux symptoms Genitourinary:negative for dysuria and frequency Integument/breast: positive for dryness and rash Hematologic/lymphatic: negative for easy bruising and lymphadenopathy Musculoskeletal:positive for back pain and neck pain Neurological: negative for dizziness and tremors Endocrine: negative for temperature intolerance  Blood pressure 110/73, pulse (!) 115, temperature 98.4 F (36.9 C), temperature source Oral, resp. rate 14, height 5\' 3"  (1.6 m), weight 168 lb (76.2 kg), last menstrual period 10/23/2016, SpO2 98%. Physical Exam Vitals reviewed.  Constitutional:      Appearance: Normal  appearance.  HENT:     Head: Normocephalic and atraumatic.  Eyes:     Extraocular Movements: Extraocular movements intact.     Pupils: Pupils are equal, round, and reactive to light.  Cardiovascular:     Rate and Rhythm: Normal rate and  regular rhythm.  Pulmonary:     Effort: Pulmonary effort is normal.     Breath sounds: Normal breath sounds.  Abdominal:     Comments: Abdomen soft distended, no percussion tenderness, nontender to palpation; no rigidity, guarding, rebound tenderness; negative Murphy sign  Musculoskeletal:        General: Normal range of motion.     Cervical back: Normal range of motion.  Skin:    General: Skin is warm and dry.  Neurological:     General: No focal deficit present.     Mental Status: She is alert and oriented to person, place, and time.  Psychiatric:        Mood and Affect: Mood normal.        Behavior: Behavior normal.     Results: Abdominal ultrasound (12/29/2023): IMPRESSION: 1. Gallstones, without convincing evidence of acute cholecystitis. 2. No other abnormalities.   Assessment & Plan:  Sharon Rowland is a 38 y.o. female who presents for valuation of cholelithiasis.  -We discussed the pathophysiology of gallbladder disease, and why we recommend surgical -I counseled the patient about the indication, risks and benefits of robotic assisted laparoscopic cholecystectomy.  She understands there is a very small chance for bleeding, infection, injury to normal structures (including common bile duct), conversion to open surgery, persistent symptoms, evolution of postcholecystectomy diarrhea, need for secondary interventions, anesthesia reaction, cardiopulmonary issues and other risks not specifically detailed here. I described the expected recovery, the plan for follow-up and the restrictions during the recovery phase.  All questions were answered. -Patient tentatively scheduled for surgery on 1/27 -Information provided to the patient regarding  cholelithiasis, cholecystitis, cholecystectomy, and low-fat diet -Advised to present to the emergency department if she begins to have worsening right upper quadrant abdominal pain, nausea, vomiting, fever, and chills  All questions were answered to the satisfaction of the patient.   Theophilus Kinds, DO [facility] [address] [place], [place] [address] [phone] (office)

## 2024-01-09 NOTE — H&P (Signed)
[facility] History and Physical  Reason for Referral: Cholelithiasis Referring Physician: ER referral  Chief Complaint   New Patient (Initial Visit)     Sharon Rowland is a 38 y.o. female.  HPI: Patient presents for evaluation of cholelithiasis.  For a while she has been having epigastric abdominal pain which she always thought was secondary to her gastroparesis.  The pain comes anytime she has food.  She recently had a severe abdominal pain with associated nausea and vomiting which prompted her to go to the emergency department for evaluation.  At that time she was diagnosed with cholelithiasis without evidence of acute cholecystitis and normal blood work.  She subsequently had another attack last night with nausea and vomiting and went to [facility] for evaluation.  She left AMA, as they gave her medication she was told she does not tolerate well.  It seems that her attacks always come on at night and do not seem to be associated with any particular type of food.  Since being evaluated in the emergency department last night, she states that she is feeling better.  She has an extensive medical history including lupus, Sjogren's, psoriatic arthritis, Ehlers-Danlos syndrome, Hashimoto's, asthma, POTS, GERD,, and thoracic outlet syndrome.  She has undergone numerous surgeries for her various conditions, but her only abdominal surgeries are vaginal hysterectomy and panniculectomy.  She is currently off of her Biologics as she has been undergoing orthopedic surgeries for her Ehlers-Danlos syndrome.  She is also not currently taking any steroids or blood thinning medications.  She denies use of tobacco products, alcohol, and illicit drugs.  Past Medical History:  Diagnosis Date   Allergic rhinitis    Anemia    Arthritis    Asthma    last used inhaler 1 month ago   Complication of anesthesia    if given fentanyl has severe itching   Cubital tunnel syndrome on left 07/2023    Depression    Dysphagia    Dysrhythmia    SVT   Ehlers-Danlos syndrome    Female bladder prolapse    Gastritis    GERD (gastroesophageal reflux disease)    with pregnancy only   Hashimoto's thyroiditis    Hypothyroidism    Lupus    Mast cell activation syndrome (HCC)    Migraines    Osteoarthritis    Pelvic pain in antepartum period in second trimester 01/17/2014   Pneumonia    POTS (postural orthostatic tachycardia syndrome)    Pregnant 08/01/2015   Psoriatic arthritis (HCC)    PSVT (paroxysmal supraventricular tachycardia) (HCC)    could not be confirmed with 2016 study   Rectocele    Sjogren's syndrome (HCC)    Thoracic outlet syndrome    Thyroid disease    Urethral prolapse    Uterine prolapse    Vaginal Pap smear, abnormal     Past Surgical History:  Procedure Laterality Date   ANKLE SURGERY Bilateral    2023 L ankle debridement and internal brace 2024 R ankle debridement and internal brace   ANTERIOR AND POSTERIOR VAGINAL REPAIR     ANTERIOR INTEROSSEOUS NERVE DECOMPRESSION Left 08/13/2023   Procedure: SUBCUTANEOUS ANTERIOR TRANSPOSITION OF ULNAR NERVE AT LEFT ELBOW;  Surgeon: Christena Flake, MD;  Location: [facility] ORS;  Service: Orthopedics;  Laterality: Left;   BREAST REDUCTION SURGERY Bilateral 08/20/2022   Procedure: MAMMARY REDUCTION  (BREAST) with Liposuction;  Surgeon: Janne Napoleon, MD;  Location: [facility] OR;  Service: Plastics;  Laterality: Bilateral;  3 hours  COLONOSCOPY WITH ESOPHAGOGASTRODUODENOSCOPY (EGD)  12/26/2022   CYSTOCELE REPAIR N/A 09/17/2017   Procedure: ANTERIOR REPAIR (CYSTOCELE);  Surgeon: Lazaro Arms, MD;  Location: [facility]S;  Service: Gynecology;  Laterality: N/A;   KNEE SURGERY Left 11/2021   KNEE SURGERY Right 2023   LIPOSUCTION  03/2022   PANNICULECTOMY N/A 04/28/2023   Procedure: PANNICULECTOMY;  Surgeon: Santiago Glad, MD;  Location: [facility] OR;  Service: Plastics;  Laterality: N/A;   SUPRAVENTRICULAR TACHYCARDIA ABLATION  01/05/2015    unsuccessful   SUPRAVENTRICULAR TACHYCARDIA ABLATION N/A 01/05/2015   Procedure: SUPRAVENTRICULAR TACHYCARDIA ABLATION;  Surgeon: Marinus Maw, MD;  Location: [facility] CATH LAB;  Service: Cardiovascular;  Laterality: N/A;   Thoracic outlet decompression  2024   VAGINAL HYSTERECTOMY N/A 09/17/2017   Procedure: HYSTERECTOMY VAGINAL WITH POSSIBLE BILATERAL SALPINGECTOMY;  Surgeon: Lazaro Arms, MD;  Location: [facility];  Service: Gynecology;  Laterality: N/A;   WRIST GANGLION EXCISION Left    x2    Family History  Problem Relation Age of Onset   Hypertension Mother    Hyperlipidemia Mother    Multiple sclerosis Mother    COPD Mother    Cancer Mother        melanoma x 2   Diabetes Mother    Heart disease Father        PSVT   Hyperlipidemia Brother    Hypertension Brother    Heart disease Maternal Grandfather        heart attack   Cancer Paternal Grandmother        pancreatic cancer   Heart disease Paternal Grandmother        MI   Hyperlipidemia Paternal Grandmother    Hypertension Paternal Grandmother    Asthma Daughter    Asthma Son    Autism Son    ADD / ADHD Son    Tourette syndrome Son    Other Son        Neurological and mental issues   ADD / ADHD Son     Social History   Tobacco Use   Smoking status: Never   Smokeless tobacco: Never  Vaping Use   Vaping status: Never Used  Substance Use Topics   Alcohol use: No   Drug use: No    Medications: I have reviewed the patient's current medications. Allergies as of 01/08/2024       Reactions   Cosentyx [secukinumab] Anaphylaxis   Plaquenil [hydroxychloroquine] Other (See Comments)   Prolonged QT   Fentanyl Itching   Latex Dermatitis   Otezla [apremilast]    Mood changes    Zetia [ezetimibe] Nausea And Vomiting   Zofran [ondansetron Hcl]    Prolonged QT   Tape Rash   Use paper tape        Medication List        Accurate as of January 08, 2024  3:03 PM. If you have any questions, ask your nurse or  doctor.          STOP taking these medications    Semaglutide-Weight Management 1 MG/0.5ML Soaj Stopped by: Stavroula Rohde A Gene Glazebrook       TAKE these medications    acetaminophen 500 MG tablet Commonly known as: TYLENOL Take 1,000 mg by mouth every 6 (six) hours as needed for moderate pain.   betamethasone dipropionate 0.05 % cream Apply 1 Application topically 2 (two) times daily as needed (irritation).   botulinum toxin Type A 200 units injection Commonly known as: BOTOX Inject 200 Units as directed  every 3 (three) months.   buPROPion 300 MG 24 hr tablet Commonly known as: WELLBUTRIN XL Take 1 tablet by mouth daily.   calcium carbonate 750 MG chewable tablet Commonly known as: TUMS EX Chew 1 tablet by mouth as needed for heartburn.   cholecalciferol 25 MCG (1000 UNIT) tablet Commonly known as: VITAMIN D3 Take 1,000 Units by mouth daily.   clobetasol cream 0.05 % Commonly known as: TEMOVATE Apply 1 Application topically daily as needed (irritation).   desonide 0.05 % ointment Commonly known as: DESOWEN Apply 1 application  topically 2 (two) times daily as needed (irritation).   diclofenac 50 MG EC tablet Commonly known as: VOLTAREN Take 2 tablets by mouth twice daily.   estradiol 0.1 MG/GM vaginal cream Commonly known as: ESTRACE 1 gram at bedtime   levothyroxine 88 MCG tablet Commonly known as: SYNTHROID Take 88 mcg by mouth daily before breakfast.   lidocaine 5 % Commonly known as: LIDODERM Place 1-3 patches onto the skin daily as needed (pain).   methylphenidate 10 MG ER tablet TAKE ONE TABLET BY MOUTH IN THE MORNING   metoCLOPramide 10 MG tablet Commonly known as: REGLAN Take 1 tablet (10 mg total) by mouth every 6 (six) hours as needed for nausea.   multivitamin with minerals Tabs tablet Take 1 tablet by mouth daily.   nitrofurantoin (macrocrystal-monohydrate) 100 MG capsule Commonly known as: MACROBID Take 1 capsule (100 mg total) by  mouth daily as needed (postcoital).   nystatin cream Commonly known as: MYCOSTATIN Apply 1 Application topically 2 (two) times daily as needed for dry skin (yeast).   oxyCODONE 5 MG immediate release tablet Commonly known as: Roxicodone Take 0.5 tablets (2.5 mg total) by mouth every 6 (six) hours as needed for severe pain (pain score 7-10).   pregabalin 100 MG capsule Commonly known as: LYRICA Take 1 capsule (100 mg total) by mouth 2 (two) times daily.   Premarin vaginal cream Generic drug: conjugated estrogens Insert 1 gram vaginally nightly.   promethazine 25 MG suppository Commonly known as: PHENERGAN Place 1 suppository (25 mg total) rectally every 8 (eight) hours as needed for vomiting or nausea.   Qulipta 30 MG Tabs Generic drug: Atogepant Take 1 tablet by mouth daily. 60 mg   Tremfya 100 MG/ML pen Generic drug: guselkumab Inject 1 Dose into the skin every 30 (thirty) days.         ROS:  Constitutional: negative for chills, fatigue, and fevers Eyes: positive for visual disturbance and pain Ears, nose, mouth, throat, and face: negative for ear drainage, sore throat, and sinus problems Respiratory: negative for cough, wheezing, and shortness of breath Cardiovascular: negative for chest pain and palpitations Gastrointestinal: positive for abdominal pain, nausea, and reflux symptoms Genitourinary:negative for dysuria and frequency Integument/breast: positive for dryness and rash Hematologic/lymphatic: negative for easy bruising and lymphadenopathy Musculoskeletal:positive for back pain and neck pain Neurological: negative for dizziness and tremors Endocrine: negative for temperature intolerance  Blood pressure 110/73, pulse (!) 115, temperature 98.4 F (36.9 C), temperature source Oral, resp. rate 14, height 5\' 3"  (1.6 m), weight 168 lb (76.2 kg), last menstrual period 10/23/2016, SpO2 98%. Physical Exam Vitals reviewed.  Constitutional:      Appearance: Normal  appearance.  HENT:     Head: Normocephalic and atraumatic.  Eyes:     Extraocular Movements: Extraocular movements intact.     Pupils: Pupils are equal, round, and reactive to light.  Cardiovascular:     Rate and Rhythm: Normal rate and  regular rhythm.  Pulmonary:     Effort: Pulmonary effort is normal.     Breath sounds: Normal breath sounds.  Abdominal:     Comments: Abdomen soft distended, no percussion tenderness, nontender to palpation; no rigidity, guarding, rebound tenderness; negative Murphy sign  Musculoskeletal:        General: Normal range of motion.     Cervical back: Normal range of motion.  Skin:    General: Skin is warm and dry.  Neurological:     General: No focal deficit present.     Mental Status: She is alert and oriented to person, place, and time.  Psychiatric:        Mood and Affect: Mood normal.        Behavior: Behavior normal.     Results: Abdominal ultrasound (12/29/2023): IMPRESSION: 1. Gallstones, without convincing evidence of acute cholecystitis. 2. No other abnormalities.   Assessment & Plan:  ELMA SANDNESS is a 38 y.o. female who presents for valuation of cholelithiasis.  -We discussed the pathophysiology of gallbladder disease, and why we recommend surgical -I counseled the patient about the indication, risks and benefits of robotic assisted laparoscopic cholecystectomy.  She understands there is a very small chance for bleeding, infection, injury to normal structures (including common bile duct), conversion to open surgery, persistent symptoms, evolution of postcholecystectomy diarrhea, need for secondary interventions, anesthesia reaction, cardiopulmonary issues and other risks not specifically detailed here. I described the expected recovery, the plan for follow-up and the restrictions during the recovery phase.  All questions were answered. -Patient tentatively scheduled for surgery on 1/27 -Information provided to the patient regarding  cholelithiasis, cholecystitis, cholecystectomy, and low-fat diet -Advised to present to the emergency department if she begins to have worsening right upper quadrant abdominal pain, nausea, vomiting, fever, and chills  All questions were answered to the satisfaction of the patient.   Theophilus Kinds, DO [facility] [address] [place], [place] [address] [phone] (office)

## 2024-01-15 ENCOUNTER — Encounter (HOSPITAL_COMMUNITY): Payer: Self-pay

## 2024-01-15 ENCOUNTER — Encounter (HOSPITAL_COMMUNITY)
Admission: RE | Admit: 2024-01-15 | Discharge: 2024-01-15 | Disposition: A | Discharge status: Home / Self Care | Payer: BC Managed Care – PPO | Source: Ambulatory Visit | Attending: Surgery | Admitting: Surgery

## 2024-01-15 ENCOUNTER — Other Ambulatory Visit: Payer: Self-pay

## 2024-01-15 NOTE — Pre-Procedure Instructions (Signed)
Completed patient's PAT visit over the phone. Pt aware to shower with antibacterial soap. Answered all questions at this time.

## 2024-01-17 ENCOUNTER — Encounter: Payer: Self-pay | Admitting: Obstetrics & Gynecology

## 2024-01-19 ENCOUNTER — Encounter (HOSPITAL_COMMUNITY)
Admission: RE | Disposition: A | Discharge status: Home / Self Care | Payer: Self-pay | Source: Ambulatory Visit | Attending: Surgery

## 2024-01-19 ENCOUNTER — Ambulatory Visit (HOSPITAL_COMMUNITY): Payer: Self-pay | Admitting: Anesthesiology

## 2024-01-19 ENCOUNTER — Ambulatory Visit (HOSPITAL_COMMUNITY)
Admission: RE | Admit: 2024-01-19 | Discharge: 2024-01-19 | Disposition: A | Discharge status: Home / Self Care | Payer: BC Managed Care – PPO | Source: Ambulatory Visit | Attending: Surgery | Admitting: Surgery

## 2024-01-19 ENCOUNTER — Other Ambulatory Visit (INDEPENDENT_AMBULATORY_CARE_PROVIDER_SITE_OTHER): Payer: BC Managed Care – PPO | Admitting: Surgery

## 2024-01-19 ENCOUNTER — Other Ambulatory Visit: Payer: Self-pay

## 2024-01-19 ENCOUNTER — Encounter (HOSPITAL_COMMUNITY): Payer: Self-pay | Admitting: Surgery

## 2024-01-19 DIAGNOSIS — K3184 Gastroparesis: Secondary | ICD-10-CM | POA: Diagnosis not present

## 2024-01-19 DIAGNOSIS — K811 Chronic cholecystitis: Secondary | ICD-10-CM

## 2024-01-19 DIAGNOSIS — M3500 Sicca syndrome, unspecified: Secondary | ICD-10-CM | POA: Insufficient documentation

## 2024-01-19 DIAGNOSIS — F32A Depression, unspecified: Secondary | ICD-10-CM | POA: Insufficient documentation

## 2024-01-19 DIAGNOSIS — L4050 Arthropathic psoriasis, unspecified: Secondary | ICD-10-CM | POA: Insufficient documentation

## 2024-01-19 DIAGNOSIS — K8020 Calculus of gallbladder without cholecystitis without obstruction: Secondary | ICD-10-CM

## 2024-01-19 DIAGNOSIS — M329 Systemic lupus erythematosus, unspecified: Secondary | ICD-10-CM | POA: Diagnosis not present

## 2024-01-19 DIAGNOSIS — E063 Autoimmune thyroiditis: Secondary | ICD-10-CM | POA: Insufficient documentation

## 2024-01-19 DIAGNOSIS — G90A Postural orthostatic tachycardia syndrome (POTS): Secondary | ICD-10-CM | POA: Diagnosis not present

## 2024-01-19 DIAGNOSIS — K8010 Calculus of gallbladder with chronic cholecystitis without obstruction: Secondary | ICD-10-CM | POA: Insufficient documentation

## 2024-01-19 DIAGNOSIS — Z90710 Acquired absence of both cervix and uterus: Secondary | ICD-10-CM | POA: Insufficient documentation

## 2024-01-19 DIAGNOSIS — G8918 Other acute postprocedural pain: Secondary | ICD-10-CM

## 2024-01-19 DIAGNOSIS — Q796 Ehlers-Danlos syndrome, unspecified: Secondary | ICD-10-CM | POA: Diagnosis not present

## 2024-01-19 SURGERY — CHOLECYSTECTOMY, ROBOT-ASSISTED, LAPAROSCOPIC
Anesthesia: General | Site: Abdomen

## 2024-01-19 MED ORDER — SCOPOLAMINE 1 MG/3DAYS TD PT72
MEDICATED_PATCH | TRANSDERMAL | Status: AC
  Filled 2024-01-19: qty 1

## 2024-01-19 MED ORDER — CHLORHEXIDINE GLUCONATE CLOTH 2 % EX PADS: 6.0000 | MEDICATED_PAD | Freq: Once | CUTANEOUS | Status: DC

## 2024-01-19 MED ORDER — DIPHENHYDRAMINE HCL 50 MG/ML IJ SOLN
INTRAMUSCULAR | Status: AC
  Filled 2024-01-19: qty 1

## 2024-01-19 MED ORDER — OXYCODONE HCL 5 MG PO TABS: 5.0000 mg | ORAL_TABLET | ORAL | 0 refills | Status: DC | PRN

## 2024-01-19 MED ORDER — CHLORHEXIDINE GLUCONATE CLOTH 2 % EX PADS
6.0000 | MEDICATED_PAD | Freq: Once | CUTANEOUS | Status: DC
Start: 2024-01-19 — End: 2024-01-19

## 2024-01-19 MED ORDER — PROPOFOL 500 MG/50ML IV EMUL
INTRAVENOUS | Status: AC
  Filled 2024-01-19: qty 50

## 2024-01-19 MED ORDER — BUPIVACAINE HCL (PF) 0.5 % IJ SOLN
INTRAMUSCULAR | Status: AC
  Filled 2024-01-19: qty 30

## 2024-01-19 MED ORDER — OXYCODONE HCL 5 MG PO TABS: 5.0000 mg | ORAL_TABLET | Freq: Four times a day (QID) | ORAL | 0 refills | Status: DC | PRN

## 2024-01-19 MED ORDER — HYDROMORPHONE HCL 1 MG/ML IJ SOLN
INTRAMUSCULAR | Status: DC | PRN
  Administered 2024-01-19 (×2): .5 mg via INTRAVENOUS

## 2024-01-19 MED ORDER — DIPHENHYDRAMINE HCL 50 MG/ML IJ SOLN
INTRAMUSCULAR | Status: DC | PRN
  Administered 2024-01-19 (×2): 12.5 mg via INTRAVENOUS

## 2024-01-19 MED ORDER — PROPOFOL 500 MG/50ML IV EMUL
INTRAVENOUS | Status: DC | PRN
Start: 2024-01-19 — End: 2024-01-19
  Administered 2024-01-19: 25 ug/kg/min via INTRAVENOUS

## 2024-01-19 MED ORDER — PROPOFOL 10 MG/ML IV BOLUS
INTRAVENOUS | Status: AC
  Filled 2024-01-19: qty 20

## 2024-01-19 MED ORDER — ROCURONIUM BROMIDE 10 MG/ML (PF) SYRINGE
PREFILLED_SYRINGE | INTRAVENOUS | Status: AC
  Filled 2024-01-19: qty 10

## 2024-01-19 MED ORDER — SUGAMMADEX SODIUM 200 MG/2ML IV SOLN
INTRAVENOUS | Status: DC | PRN
  Administered 2024-01-19: 200 mg via INTRAVENOUS

## 2024-01-19 MED ORDER — HYDROMORPHONE HCL 1 MG/ML IJ SOLN
INTRAMUSCULAR | Status: AC
  Filled 2024-01-19: qty 0.5

## 2024-01-19 MED ORDER — CHLORHEXIDINE GLUCONATE 0.12 % MT SOLN
15.0000 mL | Freq: Once | OROMUCOSAL | Status: AC
  Administered 2024-01-19: 15 mL via OROMUCOSAL

## 2024-01-19 MED ORDER — DEXAMETHASONE SODIUM PHOSPHATE 10 MG/ML IJ SOLN
INTRAMUSCULAR | Status: AC
  Filled 2024-01-19: qty 1

## 2024-01-19 MED ORDER — HYDROMORPHONE HCL 1 MG/ML IJ SOLN
0.5000 mg | INTRAMUSCULAR | Status: AC | PRN
  Administered 2024-01-19 (×4): 0.5 mg via INTRAVENOUS
  Filled 2024-01-19 (×4): qty 0.5

## 2024-01-19 MED ORDER — DEXMEDETOMIDINE HCL IN NACL 80 MCG/20ML IV SOLN
INTRAVENOUS | Status: AC
  Filled 2024-01-19: qty 40

## 2024-01-19 MED ORDER — SODIUM CHLORIDE 0.9 % IV SOLN
2.0000 g | INTRAVENOUS | Status: AC
  Administered 2024-01-19: 2 g via INTRAVENOUS
  Filled 2024-01-19: qty 2

## 2024-01-19 MED ORDER — LIDOCAINE HCL (PF) 2 % IJ SOLN
INTRAMUSCULAR | Status: AC
  Filled 2024-01-19: qty 5

## 2024-01-19 MED ORDER — OXYCODONE HCL 5 MG PO TABS
5.0000 mg | ORAL_TABLET | Freq: Once | ORAL | Status: AC | PRN
  Administered 2024-01-19: 5 mg via ORAL
  Filled 2024-01-19: qty 1

## 2024-01-19 MED ORDER — FENTANYL CITRATE PF 50 MCG/ML IJ SOSY: 25.0000 ug | PREFILLED_SYRINGE | INTRAMUSCULAR | Status: DC | PRN

## 2024-01-19 MED ORDER — KETOROLAC TROMETHAMINE 30 MG/ML IJ SOLN
INTRAMUSCULAR | Status: AC
  Filled 2024-01-19: qty 1

## 2024-01-19 MED ORDER — DIPHENHYDRAMINE HCL 50 MG/ML IJ SOLN
25.0000 mg | Freq: Once | INTRAMUSCULAR | Status: AC
  Administered 2024-01-19: 25 mg via INTRAVENOUS

## 2024-01-19 MED ORDER — KETOROLAC TROMETHAMINE 30 MG/ML IJ SOLN
30.0000 mg | INTRAMUSCULAR | Status: AC
Start: 2024-01-19 — End: 2024-01-19
  Administered 2024-01-19: 30 mg via INTRAVENOUS

## 2024-01-19 MED ORDER — SCOPOLAMINE 1 MG/3DAYS TD PT72
MEDICATED_PATCH | TRANSDERMAL | Status: DC | PRN
  Administered 2024-01-19: 1 via TRANSDERMAL

## 2024-01-19 MED ORDER — PROPOFOL 10 MG/ML IV BOLUS
INTRAVENOUS | Status: DC | PRN
  Administered 2024-01-19: 170 mg via INTRAVENOUS
  Administered 2024-01-19: 30 mg via INTRAVENOUS

## 2024-01-19 MED ORDER — DEXAMETHASONE SODIUM PHOSPHATE 10 MG/ML IJ SOLN
INTRAMUSCULAR | Status: DC | PRN
  Administered 2024-01-19: 5 mg via INTRAVENOUS

## 2024-01-19 MED ORDER — LACTATED RINGERS IV SOLN: INTRAVENOUS | Status: DC

## 2024-01-19 MED ORDER — ONDANSETRON HCL 4 MG/2ML IJ SOLN
4.0000 mg | Freq: Once | INTRAMUSCULAR | Status: DC | PRN
Start: 2024-01-19 — End: 2024-01-19

## 2024-01-19 MED ORDER — INDOCYANINE GREEN 25 MG IV SOLR
INTRAVENOUS | Status: AC
  Administered 2024-01-19: 2.5 mg via INTRAVENOUS
  Filled 2024-01-19: qty 10

## 2024-01-19 MED ORDER — KETOROLAC TROMETHAMINE 30 MG/ML IJ SOLN: 30.0000 mg | Freq: Once | INTRAMUSCULAR | Status: DC

## 2024-01-19 MED ORDER — ACETAMINOPHEN 500 MG PO TABS: 1000.0000 mg | ORAL_TABLET | Freq: Four times a day (QID) | ORAL | 0 refills | Status: AC

## 2024-01-19 MED ORDER — INDOCYANINE GREEN 25 MG IV SOLR: 2.5000 mg | Freq: Once | INTRAVENOUS | Status: AC

## 2024-01-19 MED ORDER — ORAL CARE MOUTH RINSE: 15.0000 mL | Freq: Once | OROMUCOSAL | Status: AC

## 2024-01-19 MED ORDER — DOCUSATE SODIUM 100 MG PO CAPS: 100.0000 mg | ORAL_CAPSULE | Freq: Two times a day (BID) | ORAL | 2 refills | Status: DC

## 2024-01-19 MED ORDER — ACETAMINOPHEN 10 MG/ML IV SOLN
INTRAVENOUS | Status: AC
  Filled 2024-01-19: qty 100

## 2024-01-19 MED ORDER — ACETAMINOPHEN 10 MG/ML IV SOLN
INTRAVENOUS | Status: DC | PRN
  Administered 2024-01-19: 1000 mg via INTRAVENOUS

## 2024-01-19 MED ORDER — BUPIVACAINE HCL (PF) 0.5 % IJ SOLN
INTRAMUSCULAR | Status: DC | PRN
  Administered 2024-01-19: 30 mL

## 2024-01-19 MED ORDER — ROCURONIUM BROMIDE 10 MG/ML (PF) SYRINGE
PREFILLED_SYRINGE | INTRAVENOUS | Status: DC | PRN
  Administered 2024-01-19: 60 mg via INTRAVENOUS

## 2024-01-19 MED ORDER — DIPHENHYDRAMINE HCL 50 MG/ML IJ SOLN
12.5000 mg | INTRAMUSCULAR | Status: AC
Start: 2024-01-19 — End: 2024-01-19
  Administered 2024-01-19 (×2): 12.5 mg via INTRAVENOUS

## 2024-01-19 MED ORDER — ALBUTEROL SULFATE HFA 108 (90 BASE) MCG/ACT IN AERS
INHALATION_SPRAY | RESPIRATORY_TRACT | Status: AC
  Filled 2024-01-19: qty 13.4

## 2024-01-19 MED ORDER — STERILE WATER FOR IRRIGATION IR SOLN
Status: DC | PRN
  Administered 2024-01-19: 500 mL

## 2024-01-19 MED ORDER — OXYCODONE HCL 5 MG/5ML PO SOLN: 5.0000 mg | Freq: Once | ORAL | Status: AC | PRN

## 2024-01-19 SURGICAL SUPPLY — 38 items
BLADE SURG 15 STRL LF DISP TIS (BLADE) ×1 IMPLANT
CAUTERY HOOK MNPLR 1.6 DVNC XI (INSTRUMENTS) ×1 IMPLANT
CHLORAPREP W/TINT 26 (MISCELLANEOUS) ×1 IMPLANT
CLIP LIGATING HEM O LOK PURPLE (MISCELLANEOUS) ×1 IMPLANT
DEFOGGER SCOPE WARMER CLEARIFY (MISCELLANEOUS) ×1 IMPLANT
DERMABOND ADVANCED .7 DNX12 (GAUZE/BANDAGES/DRESSINGS) ×1 IMPLANT
DRAPE ARM DVNC X/XI (DISPOSABLE) ×4 IMPLANT
DRAPE COLUMN DVNC XI (DISPOSABLE) ×1 IMPLANT
ELECT REM PT RETURN 9FT ADLT (ELECTROSURGICAL) ×1
ELECTRODE REM PT RTRN 9FT ADLT (ELECTROSURGICAL) ×1 IMPLANT
FORCEPS BPLR R/ABLATION 8 DVNC (INSTRUMENTS) ×1 IMPLANT
FORCEPS PROGRASP DVNC XI (FORCEP) ×1 IMPLANT
GLOVE BIOGEL PI IND STRL 6.5 (GLOVE) ×2 IMPLANT
GLOVE BIOGEL PI IND STRL 7.0 (GLOVE) ×3 IMPLANT
GLOVE SURG SS PI 6.5 STRL IVOR (GLOVE) ×2 IMPLANT
GOWN STRL REUS W/TWL LRG LVL3 (GOWN DISPOSABLE) ×3 IMPLANT
GRASPER SUT TROCAR 14GX15 (MISCELLANEOUS) ×1 IMPLANT
KIT TURNOVER KIT A (KITS) ×1 IMPLANT
MANIFOLD NEPTUNE II (INSTRUMENTS) ×1 IMPLANT
NDL HYPO 21X1.5 SAFETY (NEEDLE) ×1 IMPLANT
NDL INSUFFLATION 14GA 120MM (NEEDLE) ×1 IMPLANT
NEEDLE HYPO 21X1.5 SAFETY (NEEDLE) ×1
NEEDLE INSUFFLATION 14GA 120MM (NEEDLE) ×1
OBTURATOR OPTICAL STND 8 DVNC (TROCAR) ×1
OBTURATOR OPTICALSTD 8 DVNC (TROCAR) ×1 IMPLANT
PACK LAP CHOLE LZT030E (CUSTOM PROCEDURE TRAY) ×1 IMPLANT
PAD ARMBOARD 7.5X6 YLW CONV (MISCELLANEOUS) ×1 IMPLANT
PENCIL HANDSWITCHING (ELECTRODE) IMPLANT
POSITIONER HEAD 8X9X4 ADT (SOFTGOODS) ×1 IMPLANT
SEAL UNIV 5-12 XI (MISCELLANEOUS) ×3 IMPLANT
SET BASIN LINEN APH (SET/KITS/TRAYS/PACK) ×1 IMPLANT
SET TUBE SMOKE EVAC HIGH FLOW (TUBING) ×1 IMPLANT
SUT MNCRL AB 4-0 PS2 18 (SUTURE) ×2 IMPLANT
SUT VICRYL 0 AB UR-6 (SUTURE) IMPLANT
SYR 30ML LL (SYRINGE) ×1 IMPLANT
SYS RETRIEVAL 5MM INZII UNIV (BASKET) ×1
SYSTEM RETRIEVL 5MM INZII UNIV (BASKET) IMPLANT
WATER STERILE IRR 500ML POUR (IV SOLUTION) ×1 IMPLANT

## 2024-01-19 NOTE — Discharge Instructions (Addendum)
Ambulatory Surgery Discharge Instructions  General Anesthesia or Sedation Do not drive or operate heavy machinery for 24 hours.  Do not consume alcohol, tranquilizers, sleeping medications, or any non-prescribed medications for 24 hours. Do not make important decisions or sign any important papers in the next 24 hours. You should have someone with you tonight at home.  Activity  You are advised to go directly home from the hospital.  Restrict your activities and rest for a day.  Resume light activity tomorrow. No heavy lifting over 10 lbs or strenuous exercise.  Fluids and Diet Begin with clear liquids, bouillon, dry toast, soda crackers.  If not nauseated, you may go to a regular diet when you desire.  Greasy and spicy foods are not advised.  Medications  If you have not had a bowel movement in 24 hours, take 2 tablespoons over the counter Milk of mag.             You May resume your blood thinners tomorrow (Aspirin, coumadin, or other).  You are being discharged with prescriptions for Opioid/Narcotic Medications: There are some specific considerations for these medications that you should know. Opioid Meds have risks & benefits. Addiction to these meds is always a concern with prolonged use Take medication only as directed Do not drive while taking narcotic pain medication Do not crush tablets or capsules Do not use a different container than medication was dispensed in Lock the container of medication in a cool, dry place out of reach of children and pets. Opioid medication can cause addiction Do not share with anyone else (this is a felony) Do not store medications for future use. Dispose of them properly.     Disposal:  Find a [employer] household drug take back site near you.  If you can't get to a drug take back site, use the recipe below as a last resort to dispose of expired, unused or unwanted drugs. Disposal  (Do not dispose chemotherapy drugs this way, talk to your  prescribing doctor instead.) Step 1: Mix drugs (do not crush) with dirt, kitty litter, or used coffee grounds and add a small amount of water to dissolve any solid medications. Step 2: Seal drugs in plastic bag. Step 3: Place plastic bag in trash. Step 4: Take prescription container and scratch out personal information, then recycle or throw away.  Operative Site  You have a liquid bandage over your incisions, this will begin to flake off in about a week. 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 Keep wound clean and dry. No baths or swimming. No lifting more than 10 pounds.  Contact Information: If you have questions or concerns, please call our office, [phone], Monday- Thursday 8AM-5PM and Friday 8AM-12Noon.  If it is after hours or on the weekend, please call [facility] Main Number, [phone], and ask to speak to the surgeon on call for Dr. Robyne Peers at [facility]   SPECIFIC COMPLICATIONS TO WATCH FOR: Inability to urinate Fever over 101? F by mouth Nausea and vomiting lasting longer than 24 hours. Pain not relieved by medication ordered Swelling around the operative site Increased redness, warmth, hardness, around operative area Numbness, tingling, or cold fingers or toes Blood -soaked dressing, (small amounts of oozing may be normal) Increasing and progressive drainage from surgical area or exam site  May have another dose of over the counter Benadryl at 4 pm today.  Had a total  of 50 mg IV Benadryl while in the Recovery Room.

## 2024-01-19 NOTE — Interval H&P Note (Signed)
History and Physical Interval Note:  01/19/2024 7:24 AM  Sharon Rowland  has presented today for surgery, with the diagnosis of SYMPTOMATIC CHOLELITHIASIS.  The various methods of treatment have been discussed with the patient and family. After consideration of risks, benefits and other options for treatment, the patient has consented to  Procedure(s): XI ROBOTIC ASSISTED LAPAROSCOPIC CHOLECYSTECTOMY (N/A) as a surgical intervention.  The patient's history has been reviewed, patient examined, no change in status, stable for surgery.  I have reviewed the patient's chart and labs.  Questions were answered to the patient's satisfaction.     Sharon Rowland A Isaly Fasching

## 2024-01-19 NOTE — Progress Notes (Signed)
[facility]  Spoke with the patient's husband on the phone.  I explained that she tolerated the procedure without difficulty.  She has dissolvable stitches under the skin with overlying skin glue.  This will flake off in 10 to 14 days.  I discharged her home with a prescription for narcotic pain medication that they should take as needed for pain.  I also want her taking scheduled Tylenol.  If they take the narcotic pain medication, they should take a stool softener as well.  The patient will follow-up with me in 2 weeks for phone follow-up.  All questions were answered to his expressed satisfaction.  Theophilus Kinds, DO [facility] [address] [place], [place] [address] [phone] (office)

## 2024-01-19 NOTE — Op Note (Signed)
[facility] Operative Note  01/19/24  Preoperative Diagnosis: Symptomatic Cholelithiasis   Postoperative Diagnosis: Symptomatic cholelithiasis, chronic cholecystitis   Procedure(s) Performed: Robotic Assisted Laparoscopic Cholecystectomy   Surgeon: Theophilus Kinds, DO   Assistants: No qualified resident was available    Anesthesia: General endotracheal   Anesthesiologist: Dr. Johnnette Litter   Specimens: Gallbladder   Estimated Blood Loss: Minimal   Blood Replacement: None    Complications: None   Wound Class: Clean contaminated   Operative Indications: The patient was found to have cholelithiasis on imaging and was symptomatic.  We discussed the risk of the procedure including but not limited to bleeding, infection, injury to the common bile duct, bile leak, need for further procedures, chance of subtotal cholecystectomy.   Findings:  Chronically inflamed gallbladder, non-distended Critical view of safety noted All clips intact at the end of the case Adequate hemostasis   Procedure: Firefly was given in the preoperative area. The patient was taken to the operating room and placed supine. General endotracheal anesthesia was induced. Intravenous antibiotics were administered per protocol.  An orogastric tube positioned to decompress the stomach. The abdomen was prepared and draped in the usual sterile fashion. A time-out was completed verifying correct patient, procedure, site, positioning, and implant(s) and/or special equipment prior to beginning this procedure.  Veress needle was placed at the infraumbilical area and insufflation was started after confirming a positive saline drop test and no immediate increase in abdominal pressure.  After reaching 15 mm, the Veress needle was removed and a 8 mm port was placed via optiview technique infraumbilical, measuring 20 mm away from the suspected position of the gallbladder.  The abdomen was inspected and no  abnormalities or injuries were found.  Under direct vision, ports were placed in the following locations in a semi curvilinear position around the target of the gallbladder: Two 8 mm ports on the patient's right each having 8cm clearance to the adjacent ports and one 8 mm port placed on the patient's left 8 cm from the umbilical port. Once ports were placed, the table was placed in the reverse Trendelenburg position with the right side up. The Xi platform was brought into the operative field and docked to the ports successfully.  An endoscope was placed through the umbilical port, prograsp through the most lateral right port, fenestrated bipolar to the port just right of the umbilicus, and then a hook cautery in the left port.  The dome of the gallbladder was grasped with prograsp and retracted over the dome of the liver. Adhesions between the gallbladder and omentum, duodenum and transverse colon were lysed via hook cautery. The infundibulum was grasped with the fenestrated grasper and retracted toward the right lower quadrant. This maneuver exposed Calot's triangle. Firefly was used throughout the dissection to ensure safe visualization of the cystic duct.  The peritoneum overlying the gallbladder infundibulum was then dissected and the cystic duct and cystic artery identified.  Critical view of safety with the liver bed clearly visible behind the duct and artery with no additional structures noted.  The cystic duct and cystic artery were doubly clipped and divided close to the gallbladder.    The gallbladder was then dissected from its peritoneal and liver bed attachments by electrocautery. Hemostasis was checked prior to removing the hook cautery.  The [employer] was undocked and moved out of the field.  A 5mm Endo Catch bag was then placed through the umbilical port and the gallbladder was removed.  The gallbladder was passed off the  table as a specimen. There was no evidence of bleeding from the gallbladder fossa  or cystic artery or leakage of the bile from the cystic duct stump. The umbilical port site closed with a 0 vicryl with a PMI needle.  The abdomen was desufflated and secondary trocars were removed under direct vision. No bleeding was noted. Incisions were localized with marcaine.  All skin incisions were closed with subcuticular sutures of 4-0 monocryl and dermabond.   Final inspection revealed acceptable hemostasis. All counts were correct at the end of the case. The patient was awakened from anesthesia and extubated without complication. The OG tube was removed.  The patient went to the PACU in stable condition.   Theophilus Kinds, DO [facility] [address] [place], [place] [address] [phone] (office)

## 2024-01-19 NOTE — Progress Notes (Signed)
[facility]  Patient called regarding severe pain more than expected after surgery. She states that she was taking the 5 mg oxycodone, and this was not touching her pain.  She started taking 10 mg every 4 hours, and this did significantly improve her pain, but she is concerned that she will run out of her pain medications.  She has been taking schedule tylenol and diclofenac.  I have sent in an additional prescription for 5-10mg  of oxycodone that she can take up to every 4 hours as needed for pain.  Advised that if she begins to have severe pain, fever, chills, nausea/vomiting, or jaundice, then she needs to present to the emergency department for evaluation.  She is agreeable to this plan and all questions were answered to her expressed satisfaction.  Theophilus Kinds, DO [facility] [address] [place], [place] [address] [phone] (office)

## 2024-01-19 NOTE — Anesthesia Preprocedure Evaluation (Signed)
Anesthesia Evaluation  Patient identified by MRN, date of birth, ID band Patient awake    Reviewed: Allergy & Precautions, H&P , NPO status , Patient's Chart, lab work & pertinent test results, reviewed documented beta blocker date and time   History of Anesthesia Complications (+) history of anesthetic complications  Airway Mallampati: II  TM Distance: >3 FB Neck ROM: full    Dental no notable dental hx.    Pulmonary asthma , pneumonia   Pulmonary exam normal breath sounds clear to auscultation       Cardiovascular Exercise Tolerance: Good hypertension, + dysrhythmias  Rhythm:regular Rate:Normal     Neuro/Psych  Headaches PSYCHIATRIC DISORDERS  Depression     Neuromuscular disease    GI/Hepatic Neg liver ROS,GERD  ,,  Endo/Other  Hypothyroidism    Renal/GU negative Renal ROS  negative genitourinary   Musculoskeletal   Abdominal   Peds  Hematology  (+) Blood dyscrasia, anemia   Anesthesia Other Findings   Reproductive/Obstetrics negative OB ROS                             Anesthesia Physical Anesthesia Plan  ASA: 2  Anesthesia Plan: General and General ETT   Post-op Pain Management:    Induction:   PONV Risk Score and Plan: Ondansetron  Airway Management Planned:   Additional Equipment:   Intra-op Plan:   Post-operative Plan:   Informed Consent: I have reviewed the patients History and Physical, chart, labs and discussed the procedure including the risks, benefits and alternatives for the proposed anesthesia with the patient or authorized representative who has indicated his/her understanding and acceptance.     Dental Advisory Given  Plan Discussed with: CRNA  Anesthesia Plan Comments:        Anesthesia Quick Evaluation

## 2024-01-19 NOTE — Anesthesia Postprocedure Evaluation (Signed)
Anesthesia Post Note  Patient: Sharon Rowland  Procedure(s) Performed: XI ROBOTIC ASSISTED LAPAROSCOPIC CHOLECYSTECTOMY (Abdomen)  Patient location during evaluation: Phase II Anesthesia Type: General Level of consciousness: awake Pain management: pain level controlled Vital Signs Assessment: post-procedure vital signs reviewed and stable Respiratory status: spontaneous breathing and respiratory function stable Cardiovascular status: blood pressure returned to baseline and stable Postop Assessment: no headache and no apparent nausea or vomiting Anesthetic complications: no Comments: Late entry   No notable events documented.   Last Vitals:  Vitals:   01/19/24 1041 01/19/24 1045  BP: 107/66 108/60  Pulse: 85 97  Resp: 16 16  Temp:  36.8 C  SpO2: 98% 98%    Last Pain:  Vitals:   01/19/24 1045  TempSrc:   PainSc: 5                  Windell Norfolk

## 2024-01-19 NOTE — Transfer of Care (Signed)
Immediate Anesthesia Transfer of Care Note  Patient: Sharon Rowland  Procedure(s) Performed: XI ROBOTIC ASSISTED LAPAROSCOPIC CHOLECYSTECTOMY (Abdomen)  Patient Location: PACU  Anesthesia Type:General  Level of Consciousness: awake and patient cooperative  Airway & Oxygen Therapy: Patient Spontanous Breathing  Post-op Assessment: Report given to RN and Post -op Vital signs reviewed and stable  Post vital signs: Reviewed and stable  Last Vitals:  Vitals Value Taken Time  BP 121/79 01/19/24 0920  Temp 98 01/19/2024  0921  Pulse 103 01/19/24 0920  Resp 22 01/19/24 0920  SpO2 100 % 01/19/24 0920  Vitals shown include unfiled device data.  Last Pain:  Vitals:   01/19/24 0633  TempSrc: Oral  PainSc: (P) 4       Patients Stated Pain Goal: 5 (01/19/24 1610)  Complications: No notable events documented.

## 2024-01-19 NOTE — Anesthesia Procedure Notes (Signed)
Procedure Name: Intubation Date/Time: 01/19/2024 7:48 AM  Performed by: Franco Nones, CRNAPre-anesthesia Checklist: Patient identified, Patient being monitored, Timeout performed, Emergency Drugs available and Suction available Patient Re-evaluated:Patient Re-evaluated prior to induction Oxygen Delivery Method: Circle system utilized Preoxygenation: Pre-oxygenation with 100% oxygen Induction Type: IV induction Ventilation: Mask ventilation without difficulty Laryngoscope Size: Mac and 3 Grade View: Grade I Tube type: Oral Tube size: 7.0 mm Number of attempts: 1 Airway Equipment and Method: Stylet Placement Confirmation: ETT inserted through vocal cords under direct vision, positive ETCO2 and breath sounds checked- equal and bilateral Secured at: 21 cm Dental Injury: Teeth and Oropharynx as per pre-operative assessment  Comments: Tube secured with mask straps, no tape.

## 2024-01-20 LAB — SURGICAL PATHOLOGY

## 2024-01-20 NOTE — Progress Notes (Signed)
Pathology reported noted.  Choledocholithiasis means the cystic duct is obstructed by a stone on evaluation of the specimen and not a stone within the common bile duct, as per the gross description.  Theophilus Kinds, DO [facility] [address] [place], [place] [address] [phone] (office)

## 2024-01-25 ENCOUNTER — Emergency Department (HOSPITAL_COMMUNITY)
Admission: EM | Admit: 2024-01-25 | Discharge: 2024-01-25 | Disposition: A | Discharge status: Home / Self Care | Payer: BC Managed Care – PPO | Attending: Emergency Medicine | Admitting: Emergency Medicine

## 2024-01-25 ENCOUNTER — Other Ambulatory Visit: Payer: Self-pay

## 2024-01-25 ENCOUNTER — Emergency Department (HOSPITAL_COMMUNITY): Payer: BC Managed Care – PPO

## 2024-01-25 ENCOUNTER — Encounter (HOSPITAL_COMMUNITY): Payer: Self-pay | Admitting: *Deleted

## 2024-01-25 DIAGNOSIS — R1011 Right upper quadrant pain: Secondary | ICD-10-CM | POA: Insufficient documentation

## 2024-01-25 DIAGNOSIS — R112 Nausea with vomiting, unspecified: Secondary | ICD-10-CM | POA: Diagnosis not present

## 2024-01-25 DIAGNOSIS — G8918 Other acute postprocedural pain: Secondary | ICD-10-CM

## 2024-01-25 LAB — CBC WITH DIFFERENTIAL/PLATELET
Abs Immature Granulocytes: 0.01 10*3/uL (ref 0.00–0.07)
Basophils Absolute: 0 10*3/uL (ref 0.0–0.1)
Basophils Relative: 1 %
Eosinophils Absolute: 0 10*3/uL (ref 0.0–0.5)
Eosinophils Relative: 1 %
HCT: 42.2 % (ref 36.0–46.0)
Hemoglobin: 14.1 g/dL (ref 12.0–15.0)
Immature Granulocytes: 0 %
Lymphocytes Relative: 24 %
Lymphs Abs: 1.9 10*3/uL (ref 0.7–4.0)
MCH: 27.3 pg (ref 26.0–34.0)
MCHC: 33.4 g/dL (ref 30.0–36.0)
MCV: 81.6 fL (ref 80.0–100.0)
Monocytes Absolute: 0.6 10*3/uL (ref 0.1–1.0)
Monocytes Relative: 8 %
Neutro Abs: 5.3 10*3/uL (ref 1.7–7.7)
Neutrophils Relative %: 66 %
Platelets: 329 10*3/uL (ref 150–400)
RBC: 5.17 MIL/uL — ABNORMAL HIGH (ref 3.87–5.11)
RDW: 14.2 % (ref 11.5–15.5)
WBC: 7.9 10*3/uL (ref 4.0–10.5)
nRBC: 0 % (ref 0.0–0.2)

## 2024-01-25 LAB — COMPREHENSIVE METABOLIC PANEL
ALT: 19 U/L (ref 0–44)
AST: 13 U/L — ABNORMAL LOW (ref 15–41)
Albumin: 4.5 g/dL (ref 3.5–5.0)
Alkaline Phosphatase: 85 U/L (ref 38–126)
Anion gap: 13 (ref 5–15)
BUN: 10 mg/dL (ref 6–20)
CO2: 25 mmol/L (ref 22–32)
Calcium: 10.2 mg/dL (ref 8.9–10.3)
Chloride: 100 mmol/L (ref 98–111)
Creatinine, Ser: 0.79 mg/dL (ref 0.44–1.00)
GFR, Estimated: >60 mL/min (ref >60)
Glucose, Bld: 112 mg/dL — ABNORMAL HIGH (ref 70–99)
Potassium: 3.5 mmol/L (ref 3.5–5.1)
Sodium: 138 mmol/L (ref 135–145)
Total Bilirubin: 0.5 mg/dL (ref 0.0–1.2)
Total Protein: 8.2 g/dL — ABNORMAL HIGH (ref 6.5–8.1)

## 2024-01-25 LAB — LIPASE, BLOOD: Lipase: 31 U/L (ref 11–51)

## 2024-01-25 MED ORDER — SODIUM CHLORIDE 0.9 % IV SOLN: INTRAVENOUS | Status: DC

## 2024-01-25 MED ORDER — SODIUM CHLORIDE 0.9 % IV BOLUS
1000.0000 mL | Freq: Once | INTRAVENOUS | Status: AC
  Administered 2024-01-25: 1000 mL via INTRAVENOUS

## 2024-01-25 MED ORDER — IOHEXOL 300 MG/ML  SOLN
100.0000 mL | Freq: Once | INTRAMUSCULAR | Status: AC | PRN
  Administered 2024-01-25: 100 mL via INTRAVENOUS

## 2024-01-25 MED ORDER — ACETAMINOPHEN 325 MG PO TABS: 650.0000 mg | ORAL_TABLET | Freq: Once | ORAL | Status: DC

## 2024-01-25 MED ORDER — KETOROLAC TROMETHAMINE 30 MG/ML IJ SOLN
30.0000 mg | Freq: Once | INTRAMUSCULAR | Status: AC
  Administered 2024-01-25: 30 mg via INTRAVENOUS
  Filled 2024-01-25: qty 1

## 2024-01-25 MED ORDER — SCOPOLAMINE 1 MG/3DAYS TD PT72: 1.0000 | MEDICATED_PATCH | TRANSDERMAL | 1 refills | Status: DC

## 2024-01-25 NOTE — ED Provider Notes (Signed)
[place] EMERGENCY DEPARTMENT AT [facility] Provider Note   CSN: [id number] Arrival date & time: 01/25/24  1807     History  Chief Complaint  Patient presents with   Abdominal Pain    Sharon Rowland is a 38 y.o. female.   Abdominal Pain  38 year old female, history of Ehlers-Danlos syndrome, multiple soft tissue surgeries, prior vaginal hysterectomy as well as a laparoscopic cholecystectomy that was performed approximately 6 days ago.  The patient reports that ever since the surgery she has been having some ongoing right upper quadrant pain with persistent vomiting, the pain has worsened over the last couple of days prompting her to talk to her surgeon, they recommended that she take increased doses of oxycodone but this has not been helping.  The pain is located in the right upper quadrant with radiation into the right shoulder and the back, it is not associated with fevers, she has been vomiting rather persistently and has had very little to eat.  She denies diarrhea    Home Medications Prior to Admission medications   Medication Sig Start Date End Date Taking? Authorizing Provider  scopolamine (TRANSDERM-SCOP) 1 MG/3DAYS Place 1 patch (1.5 mg total) onto the skin every 3 (three) days. 01/25/24  Yes Eber Hong, MD  albuterol (VENTOLIN HFA) 108 (90 Base) MCG/ACT inhaler Inhale 1-2 puffs into the lungs every 6 (six) hours as needed for wheezing or shortness of breath.    [provider]  Atogepant (QULIPTA) 60 MG TABS Take 60 mg by mouth daily.    [provider]  betamethasone dipropionate 0.05 % cream Apply 1 Application topically 2 (two) times daily as needed (irritation).    [provider]  botulinum toxin Type A (BOTOX) 200 units injection Inject 200 Units as directed every 3 (three) months.    [provider]  buPROPion (WELLBUTRIN XL) 300 MG 24 hr tablet Take 1 tablet by mouth daily. 09/05/23   Lazaro Arms, MD  calcium  carbonate (TUMS EX) 750 MG chewable tablet Chew 1 tablet by mouth as needed for heartburn.    [provider]  cholecalciferol (VITAMIN D3) 25 MCG (1000 UNIT) tablet Take 1,000 Units by mouth daily.    [provider]  clobetasol cream (TEMOVATE) 0.05 % Apply 1 Application topically daily as needed (irritation).    [provider]  desonide (DESOWEN) 0.05 % ointment Apply 1 application  topically 2 (two) times daily as needed (irritation). 04/24/20   [provider]  diclofenac (VOLTAREN) 50 MG EC tablet Take 2 tablets by mouth twice daily. 09/05/23   Lazaro Arms, MD  docusate sodium (COLACE) 100 MG capsule Take 1 capsule (100 mg total) by mouth 2 (two) times daily. 01/19/24 01/18/25  Pappayliou, Santina Evans A, DO  Guselkumab (TREMFYA) 100 MG/ML SOPN Inject 1 Dose into the skin every 30 (thirty) days.    [provider]  levothyroxine (SYNTHROID) 88 MCG tablet Take 88 mcg by mouth daily before breakfast. 10/28/22   [provider]  methylphenidate 10 MG ER tablet TAKE ONE TABLET BY MOUTH IN THE MORNING 04/18/23   Lazaro Arms, MD  metoCLOPramide (REGLAN) 10 MG tablet Take 1 tablet (10 mg total) by mouth every 6 (six) hours as needed for nausea. 12/29/23   Peter Garter, PA  Multiple Vitamin (MULTIVITAMIN WITH MINERALS) TABS tablet Take 1 tablet by mouth daily.    [provider]  nystatin cream (MYCOSTATIN) Apply 1 Application topically 2 (two) times daily  as needed for dry skin (yeast).    [provider]  oxyCODONE (ROXICODONE) 5 MG immediate release tablet Take 1-2 tablets (5-10 mg total) by mouth every 4 (four) hours as needed for severe pain (pain score 7-10) or moderate pain (pain score 4-6). 01/19/24   Pappayliou, Santina Evans A, DO  pregabalin (LYRICA) 100 MG capsule Take 1 capsule (100 mg total) by mouth 2 (two) times daily. 10/23/22   Lazaro Arms, MD  PREMARIN vaginal cream Insert 1 gram vaginally nightly. 09/05/23   Lazaro Arms, MD  promethazine (PHENERGAN) 25 MG suppository Place 1 suppository (25 mg total) rectally every 8 (eight) hours as needed for vomiting or nausea. 02/13/23   Lazaro Arms, MD      Allergies    Compazine [prochlorperazine], Cosentyx [secukinumab], Other, Plaquenil [hydroxychloroquine], Reglan [metoclopramide], Tape, Tegaderm ag mesh [silver], Latex, Otezla [apremilast], Zetia [ezetimibe], Zofran [ondansetron hcl], and Fentanyl    Review of Systems   Review of Systems  Gastrointestinal:  Positive for abdominal pain.  All other systems reviewed and are negative.   Physical Exam Updated Vital Signs BP 116/71   Pulse 91   Temp 99.7 F (37.6 C) (Oral)   Resp 15   Ht 1.6 m (5\' 3" )   Wt 70.8 kg   LMP 10/23/2016 (Approximate)   SpO2 96%   BMI 27.63 kg/m  Physical Exam Vitals and nursing note reviewed.  Constitutional:      General: She is not in acute distress.    Appearance: She is well-developed. She is not ill-appearing.     Comments: Uncomfortable appearing  HENT:     Head: Normocephalic and atraumatic.     Mouth/Throat:     Mouth: Mucous membranes are dry.     Pharynx: No oropharyngeal exudate.  Eyes:     General: No scleral icterus.       Right eye: No discharge.        Left eye: No discharge.     Conjunctiva/sclera: Conjunctivae normal.     Pupils: Pupils are equal, round, and reactive to light.  Neck:     Thyroid: No thyromegaly.     Vascular: No JVD.  Cardiovascular:     Rate and Rhythm: Regular rhythm. Tachycardia present.     Heart sounds: Normal heart sounds. No murmur heard.    No friction rub. No gallop.  Pulmonary:     Effort: Pulmonary effort is normal. No respiratory distress.     Breath sounds: Normal breath sounds. No wheezing or rales.  Abdominal:     General: Bowel sounds are normal. There is no distension.     Palpations: Abdomen is soft. There is no mass.     Tenderness: There is abdominal tenderness.     Comments: The abdomen is very  soft, there is some tenderness in the right upper quadrant however she reports that it is not really made worse with palpation.  There is no guarding or peritoneal signs, the surgical sites appear intact without any surrounding redness drainage or induration.  Musculoskeletal:        General: No tenderness. Normal range of motion.     Cervical back: Normal range of motion and neck supple.     Right lower leg: No edema.     Left lower leg: No edema.  Lymphadenopathy:     Cervical: No cervical adenopathy.  Skin:    General: Skin is warm and dry.     Findings: No erythema or rash.  Neurological:     Mental Status: She is alert.     Coordination: Coordination normal.  Psychiatric:        Behavior: Behavior normal.     ED Results / Procedures / Treatments   Labs (all labs ordered are listed, but only abnormal results are displayed) Labs Reviewed  COMPREHENSIVE METABOLIC PANEL - Abnormal; Notable for the following components:      Result Value   Glucose, Bld 112 (*)    Total Protein 8.2 (*)    AST 13 (*)    All other components within normal limits  CBC WITH DIFFERENTIAL/PLATELET - Abnormal; Notable for the following components:   RBC 5.17 (*)    All other components within normal limits  LIPASE, BLOOD  URINALYSIS, ROUTINE W REFLEX MICROSCOPIC    EKG None  Radiology CT ABDOMEN PELVIS W CONTRAST Result Date: 01/25/2024 CLINICAL DATA:  Right upper quadrant pain recent cholecystectomy EXAM: CT ABDOMEN AND PELVIS WITH CONTRAST TECHNIQUE: Multidetector CT imaging of the abdomen and pelvis was performed using the standard protocol following bolus administration of intravenous contrast. RADIATION DOSE REDUCTION: This exam was performed according to the departmental dose-optimization program which includes automated exposure control, adjustment of the mA and/or kV according to patient size and/or use of iterative reconstruction technique. CONTRAST:  OMNIPAQUE IOHEXOL 300 MG/ML  SOLN  COMPARISON:  Ultrasound 12/29/2023, CT 04/28/2020 FINDINGS: Lower chest: No acute abnormality. Hepatobiliary: Interval cholecystectomy. No fluid collections. Minimal stranding likely due to postoperative change. No biliary dilatation Pancreas: Unremarkable. No pancreatic ductal dilatation or surrounding inflammatory changes. Spleen: Normal in size without focal abnormality. Adrenals/Urinary Tract: Adrenal glands are unremarkable. Kidneys are normal, without renal calculi, focal lesion, or hydronephrosis. Bladder is unremarkable. Stomach/Bowel: Stomach is within normal limits. Appendix appears normal. No evidence of bowel wall thickening, distention, or inflammatory changes. Vascular/Lymphatic: No significant vascular findings are present. No enlarged abdominal or pelvic lymph nodes. Reproductive: Hysterectomy. Possible 2.6 cm left adnexal cyst, no imaging follow-up is recommended. Other: Negative for pelvic effusion or free air Musculoskeletal: No acute or suspicious osseous abnormality IMPRESSION: Interval cholecystectomy. No fluid collections. Minimal stranding at the gallbladder fossa likely due to postoperative change. Electronically Signed   By: Jasmine Pang M.D.   On: 01/25/2024 19:46    Procedures Procedures    Medications Ordered in ED Medications  sodium chloride 0.9 % bolus 1,000 mL (0 mLs Intravenous Stopped 01/25/24 1959)    And  0.9 %  sodium chloride infusion (0 mLs Intravenous Hold 01/25/24 2004)  acetaminophen (TYLENOL) tablet 650 mg (0 mg Oral Hold 01/25/24 1925)  iohexol (OMNIPAQUE) 300 MG/ML solution 100 mL (100 mLs Intravenous Contrast Given 01/25/24 1927)  ketorolac (TORADOL) 30 MG/ML injection 30 mg (30 mg Intravenous Given 01/25/24 2008)    ED Course/ Medical Decision Making/ A&P                                 Medical Decision Making Amount and/or Complexity of Data Reviewed Labs: ordered. Radiology: ordered.  Risk OTC drugs. Prescription drug management.    This  patient presents to the ED for concern of postoperative abdominal pain, this involves an extensive number of treatment options, and is a complaint that carries with it a high risk of complications and morbidity.  The differential diagnosis includes postop infection, abdominal pain which is somewhat chronic, could be related to pancreatitis, hemorrhage, or just plain postop pain.   Co morbidities  that complicate the patient evaluation  Multiple prior surgeries, was told that she had adhesions on her prior surgery   Additional history obtained:  Additional history obtained from medical record External records from outside source obtained and reviewed including surgical notes   Lab Tests:  I Ordered, and personally interpreted labs.  The pertinent results include: No leukocytosis, no signs of severe dehydration   Imaging Studies ordered:  I ordered imaging studies including CT scan of the abdomen and pelvis I independently visualized and interpreted imaging which showed no postoperative complications I agree with the radiologist interpretation   Cardiac Monitoring: / EKG:  The patient was maintained on a cardiac monitor.  I personally viewed and interpreted the cardiac monitored which showed an underlying rhythm of: Initially tachycardic with a tachycardia resolved with IV fluids, taking p.o. by the end of the visit   Consultations Obtained:  I requested consultation with the general surgeon Dr. Delice Lesch,  and discussed lab and imaging findings as well as pertinent plan - they recommend: Discharge home as the patient is feeling much better, schisis with the patient and she is in total agreement   Problem List / ED Course / Critical interventions / Medication management  Postop pain nausea and vomiting, the patient is agreeable to scopolamine patches, she is very comfortable with the plan, discharge heart rate is 85 I have reviewed the patients home medicines and have made  adjustments as needed   Social Determinants of Health:  Multiple chronic diseases including connective tissue diseases and autoimmune diseases   Test / Admission - Considered:  Consider admission but the patient has done well and is stable for discharge         Final Clinical Impression(s) / ED Diagnoses Final diagnoses:  Postoperative abdominal pain  Nausea and vomiting, unspecified vomiting type    Rx / DC Orders ED Discharge Orders          Ordered    scopolamine (TRANSDERM-SCOP) 1 MG/3DAYS  every 72 hours        01/25/24 2044              Eber Hong, MD 01/25/24 2045

## 2024-01-25 NOTE — ED Triage Notes (Signed)
Pt with continued abd pain with emesis, had lap chole done past Monday. Has tried pain med without relief.

## 2024-01-25 NOTE — ED Notes (Signed)
 Patient transported to CT

## 2024-01-25 NOTE — Discharge Instructions (Signed)
May find some benefit with the scopolamine patches  Call the general surgery office for follow-up this week  ER for worsening symptoms

## 2024-01-27 ENCOUNTER — Ambulatory Visit (INDEPENDENT_AMBULATORY_CARE_PROVIDER_SITE_OTHER): Payer: BC Managed Care – PPO | Admitting: Surgery

## 2024-01-27 ENCOUNTER — Encounter: Payer: Self-pay | Admitting: Surgery

## 2024-01-27 VITALS — BP 114/77 | HR 93 | Temp 98.3°F | Resp 12 | Ht 63.0 in | Wt 165.0 lb

## 2024-01-27 DIAGNOSIS — R112 Nausea with vomiting, unspecified: Secondary | ICD-10-CM

## 2024-01-27 DIAGNOSIS — Z9889 Other specified postprocedural states: Secondary | ICD-10-CM

## 2024-01-27 DIAGNOSIS — Z09 Encounter for follow-up examination after completed treatment for conditions other than malignant neoplasm: Secondary | ICD-10-CM

## 2024-01-29 ENCOUNTER — Inpatient Hospital Stay: Admit: 2024-01-29 | Discharge: 2024-01-30 | Payer: BLUE CROSS/BLUE SHIELD

## 2024-01-29 ENCOUNTER — Ambulatory Visit: Admit: 2024-01-29 | Discharge: 2024-01-30 | Payer: BLUE CROSS/BLUE SHIELD

## 2024-01-29 DIAGNOSIS — M25512 Pain in left shoulder: Principal | ICD-10-CM

## 2024-01-29 DIAGNOSIS — M25312 Other instability, left shoulder: Principal | ICD-10-CM

## 2024-01-30 NOTE — Progress Notes (Signed)
 [facility] Note   HPI:  38 y.o. Female presents to clinic for post-op follow-up status post robotic assisted laparoscopic cholecystectomy on 1/27.  She had significant pain with nausea and vomiting, so she presented to the emergency department on 2/2.  At that time, her workup was negative, with normal blood work and a CT scan that had no abnormalities.  She states that she was having significantly worsening nausea and vomiting but this has completely resolved since she was prescribed scopolamine  patches by the emergency department.  She has been able to hydrate and actually consume food.  Her last EGD with GI was a year ago and it demonstrated gastritis.  She denies fevers and chills.  Review of Systems:  All other review of systems: otherwise negative   Vital Signs:  BP 114/77   Pulse 93   Temp 98.3 F (36.8 C) (Oral)   Resp 12   Ht 5' 3 (1.6 m)   Wt 165 lb (74.8 kg)   LMP 10/23/2016 (Approximate)   SpO2 98%   BMI 29.23 kg/m    Physical Exam:  Physical Exam Vitals reviewed.  Constitutional:      Appearance: Normal appearance.  Abdominal:     Comments: Abdomen soft, nondistended, no percussion tenderness, nontender to palpation; no rigidity, guarding, rebound tenderness; laparoscopic incision sites C/D/I with Dermabond in place  Neurological:     Mental Status: She is alert.     Laboratory studies: CBC:  Lab Results  Component Value Date   WBC 7.9 01/25/2024   RBC 5.17 (H) 01/25/2024   BMP:  Lab Results  Component Value Date   GLUCOSE 112 (H) 01/25/2024   CO2 25 01/25/2024   BUN 10 01/25/2024   BUN 10 09/10/2017   CREATININE 0.79 01/25/2024   CALCIUM  10.2 01/25/2024       Latest Ref Rng & Units 01/25/2024    6:29 PM 12/29/2023    9:58 AM 03/09/2022    4:52 PM  Hepatic Function  Total Protein 6.5 - 8.1 g/dL 8.2  7.0  7.5   Albumin 3.5 - 5.0 g/dL 4.5  4.1  4.1   AST 15 - 41 U/L 13  11  10    ALT 0 - 44 U/L 19  12  15    Alk Phosphatase 38 - 126  U/L 85  66  70   Total Bilirubin 0.0 - 1.2 mg/dL 0.5  <9.7  0.8     Imaging:  CT of the abdomen and pelvis (01/25/2024): IMPRESSION: Interval cholecystectomy. No fluid collections. Minimal stranding at the gallbladder fossa likely due to postoperative change.  Assessment:  38 y.o. yo Female who presents for follow-up status post robotic assisted laparoscopic cholecystectomy on 1/27 with subsequent postoperative pain, nausea, and vomiting.  Plan:  -Patient is now feeling significantly better since she was started on scopolamine  patches by the emergency department.  She is able to tolerate a diet and has not taken anything for pain.  She believes that her pain was being exacerbated by her multiple episodes of nausea and vomiting -Discussed with the patient that if her nausea and vomiting continues to persist, she will need to be evaluated by her GI physician, as the symptoms are no longer related to her gallbladder -All of her imaging and blood work is reassuring that there are no acute postoperative complications at this time -Advised that the skin glue should come off over the next week.  If it is still on at 2 weeks out from  surgery, she can use antibiotic ointment prior to showering to help get the remaining skin glue off -Follow up with me as needed  All of the above recommendations were discussed with the patient, and all of patient's questions were answered to her expressed satisfaction.  Dorothyann Brittle, DO [facility] [address] [place], [place] [address] [phone] (office)

## 2024-02-02 ENCOUNTER — Ambulatory Visit: Admit: 2024-02-02 | Discharge: 2024-02-02 | Payer: BLUE CROSS/BLUE SHIELD

## 2024-02-02 DIAGNOSIS — G43119 Migraine with aura, intractable, without status migrainosus: Principal | ICD-10-CM

## 2024-02-03 ENCOUNTER — Encounter: Payer: BC Managed Care – PPO | Admitting: Surgery

## 2024-02-25 ENCOUNTER — Emergency Department (HOSPITAL_COMMUNITY): Admission: EM | Admit: 2024-02-25 | Discharge: 2024-02-26 | Disposition: A | Discharge status: Home / Self Care

## 2024-02-25 ENCOUNTER — Encounter (HOSPITAL_COMMUNITY): Payer: Self-pay

## 2024-02-25 ENCOUNTER — Other Ambulatory Visit: Payer: Self-pay

## 2024-02-25 ENCOUNTER — Emergency Department (HOSPITAL_COMMUNITY)

## 2024-02-25 DIAGNOSIS — M329 Systemic lupus erythematosus, unspecified: Secondary | ICD-10-CM | POA: Diagnosis not present

## 2024-02-25 DIAGNOSIS — R000 Tachycardia, unspecified: Secondary | ICD-10-CM | POA: Diagnosis not present

## 2024-02-25 DIAGNOSIS — Z79899 Other long term (current) drug therapy: Secondary | ICD-10-CM | POA: Diagnosis not present

## 2024-02-25 DIAGNOSIS — Z7982 Long term (current) use of aspirin: Secondary | ICD-10-CM | POA: Insufficient documentation

## 2024-02-25 DIAGNOSIS — Z91040 Latex allergy status: Secondary | ICD-10-CM | POA: Insufficient documentation

## 2024-02-25 DIAGNOSIS — R079 Chest pain, unspecified: Secondary | ICD-10-CM | POA: Diagnosis present

## 2024-02-25 LAB — CBC WITH DIFFERENTIAL/PLATELET
Abs Immature Granulocytes: 0.02 10*3/uL (ref 0.00–0.07)
Basophils Absolute: 0.1 10*3/uL (ref 0.0–0.1)
Basophils Relative: 1 %
Eosinophils Absolute: 0 10*3/uL (ref 0.0–0.5)
Eosinophils Relative: 0 %
HCT: 41.6 % (ref 36.0–46.0)
Hemoglobin: 13.6 g/dL (ref 12.0–15.0)
Immature Granulocytes: 0 %
Lymphocytes Relative: 35 %
Lymphs Abs: 3 10*3/uL (ref 0.7–4.0)
MCH: 26.9 pg (ref 26.0–34.0)
MCHC: 32.7 g/dL (ref 30.0–36.0)
MCV: 82.2 fL (ref 80.0–100.0)
Monocytes Absolute: 0.7 10*3/uL (ref 0.1–1.0)
Monocytes Relative: 8 %
Neutro Abs: 4.7 10*3/uL (ref 1.7–7.7)
Neutrophils Relative %: 56 %
Platelets: 300 10*3/uL (ref 150–400)
RBC: 5.06 MIL/uL (ref 3.87–5.11)
RDW: 13.9 % (ref 11.5–15.5)
WBC: 8.5 10*3/uL (ref 4.0–10.5)
nRBC: 0 % (ref 0.0–0.2)

## 2024-02-25 LAB — URINALYSIS, ROUTINE W REFLEX MICROSCOPIC
Bilirubin Urine: NEGATIVE
Glucose, UA: NEGATIVE mg/dL
Hgb urine dipstick: NEGATIVE
Ketones, ur: NEGATIVE mg/dL
Leukocytes,Ua: NEGATIVE
Nitrite: NEGATIVE
Protein, ur: NEGATIVE mg/dL
Specific Gravity, Urine: 1.002 — ABNORMAL LOW (ref 1.005–1.030)
pH: 6 (ref 5.0–8.0)

## 2024-02-25 LAB — COMPREHENSIVE METABOLIC PANEL
ALT: 14 U/L (ref 0–44)
AST: 11 U/L — ABNORMAL LOW (ref 15–41)
Albumin: 4.4 g/dL (ref 3.5–5.0)
Alkaline Phosphatase: 66 U/L (ref 38–126)
Anion gap: 10 (ref 5–15)
BUN: 6 mg/dL (ref 6–20)
CO2: 24 mmol/L (ref 22–32)
Calcium: 9.6 mg/dL (ref 8.9–10.3)
Chloride: 105 mmol/L (ref 98–111)
Creatinine, Ser: 0.79 mg/dL (ref 0.44–1.00)
GFR, Estimated: >60 mL/min (ref >60)
Glucose, Bld: 98 mg/dL (ref 70–99)
Potassium: 3.3 mmol/L — ABNORMAL LOW (ref 3.5–5.1)
Sodium: 139 mmol/L (ref 135–145)
Total Bilirubin: 0.4 mg/dL (ref 0.0–1.2)
Total Protein: 7.6 g/dL (ref 6.5–8.1)

## 2024-02-25 LAB — TSH: TSH: 0.637 u[IU]/mL (ref 0.350–4.500)

## 2024-02-25 LAB — HCG, SERUM, QUALITATIVE: Preg, Serum: NEGATIVE

## 2024-02-25 LAB — D-DIMER, QUANTITATIVE: D-Dimer, Quant: 0.58 ug{FEU}/mL — ABNORMAL HIGH (ref 0.00–0.50)

## 2024-02-25 MED ORDER — LACTATED RINGERS IV BOLUS
1000.0000 mL | Freq: Once | INTRAVENOUS | Status: AC
  Administered 2024-02-25: 1000 mL via INTRAVENOUS

## 2024-02-25 MED ORDER — PREDNISONE 10 MG PO TABS: ORAL_TABLET | ORAL | 0 refills | Status: DC

## 2024-02-25 MED ORDER — IOHEXOL 350 MG/ML SOLN
75.0000 mL | Freq: Once | INTRAVENOUS | Status: AC | PRN
  Administered 2024-02-25: 75 mL via INTRAVENOUS

## 2024-02-25 MED ORDER — POTASSIUM CHLORIDE CRYS ER 20 MEQ PO TBCR
40.0000 meq | EXTENDED_RELEASE_TABLET | Freq: Once | ORAL | Status: AC
Start: 2024-02-25 — End: 2024-02-25
  Administered 2024-02-25: 40 meq via ORAL
  Filled 2024-02-25: qty 2

## 2024-02-25 NOTE — Discharge Instructions (Signed)
 Your workup today was reassuring.  Please start the prednisone as prescribed and follow-up with your specialist.  Return to the ER for worsening symptoms.

## 2024-02-25 NOTE — ED Notes (Signed)
 Pt concerned she is having a flare up of her Lupus. Pt reports having "lupus lips" and a rash recently.

## 2024-02-25 NOTE — ED Triage Notes (Signed)
 Pt arrived via POV from home c/o chest pain, and reports receiving a phone call from her doctors office advising her to seek treatment and evaluation in the ER. Pt reports Hx of multiple autoimmune disease, specifically thyroid problems. Pt presents very anxious in Triage. Pts HR in the 140's.

## 2024-02-25 NOTE — ED Provider Notes (Signed)
 [place] EMERGENCY DEPARTMENT AT [facility] Provider Note   CSN: [id number] Arrival date & time: 02/25/24  1750     History  Chief Complaint  Patient presents with   Chest Pain    Sharon Rowland is a 38 y.o. female.  38 year old female with past medical history of Hashimoto's thyroiditis as well as lupus and Ehlers-Danlos syndrome presenting to the emergency department today with concern for tachycardia.  The patient states that she stopped her Biologics for an upcoming surgery and has not been on these for the past few weeks.  She states that initially she called her doctors at [facility] to request some steroids because she was having a rash around her face and arms that is typical of lupus flares for her.  She told them that she was having palpitations and apparently had some testing with abnormal thyroid function testing.  She was told to come to the ER for further evaluation.  The patient states she does have some occasional lightheadedness with this.  She states that she is a little over 4 weeks postop from a cholecystectomy.  She has been doing relatively well secondary to this.  She denies any hemoptysis.  She states that she has been having some mild dyspnea on exertion with the palpitations.  She states that she has had some thyroid nodules and they are apparently keeping an eye on her thyroid as an outpatient.     Chest Pain Associated symptoms: palpitations        Home Medications Prior to Admission medications   Medication Sig Start Date End Date Taking? Authorizing Provider  naproxen (NAPROSYN) 500 MG tablet Take 500 mg by mouth 2 (two) times daily. 11/24/23  Yes [provider]  predniSONE (DELTASONE) 10 MG tablet Take 1 tablet by mouth daily for 10 days 02/25/24  Yes Durwin Glaze, MD  albuterol (VENTOLIN HFA) 108 (90 Base) MCG/ACT inhaler Inhale 1-2 puffs into the lungs every 6 (six) hours as needed for wheezing or shortness of breath.    [provider]  aspirin 81 MG chewable tablet Chew 81 mg by mouth daily. 11/24/23   [provider]  Atogepant (QULIPTA) 60 MG TABS Take 60 mg by mouth daily.    [provider]  betamethasone dipropionate 0.05 % cream Apply 1 Application topically 2 (two) times daily as needed (irritation).    [provider]  botulinum toxin Type A (BOTOX) 200 units injection Inject 200 Units as directed every 3 (three) months.    [provider]  buPROPion (WELLBUTRIN XL) 300 MG 24 hr tablet Take 1 tablet by mouth daily. 09/05/23   Lazaro Arms, MD  calcium carbonate (TUMS EX) 750 MG chewable tablet Chew 1 tablet by mouth as needed for heartburn.    [provider]  cholecalciferol (VITAMIN D3) 25 MCG (1000 UNIT) tablet Take 1,000 Units by mouth daily.    [provider]  clobetasol cream (TEMOVATE) 0.05 % Apply 1 Application topically daily as needed (irritation).    [provider]  desonide (DESOWEN) 0.05 % ointment Apply 1 application  topically 2 (two) times daily as needed (irritation). 04/24/20   [provider]  diclofenac (VOLTAREN) 50 MG EC tablet Take 2 tablets by mouth twice daily. 09/05/23   Lazaro Arms, MD  docusate sodium (COLACE) 100 MG capsule Take 1 capsule (100 mg total) by mouth 2 (two) times daily. 01/19/24 01/18/25  Pappayliou, Santina Evans A, DO  Guselkumab (TREMFYA) 100  MG/ML SOPN Inject 1 Dose into the skin every 30 (thirty) days.    [provider]  levothyroxine (SYNTHROID) 88 MCG tablet Take 88 mcg by mouth daily before breakfast. 10/28/22   [provider]  methylphenidate 10 MG ER tablet TAKE ONE TABLET BY MOUTH IN THE MORNING 04/18/23   Lazaro Arms, MD  metoCLOPramide (REGLAN) 10 MG tablet Take 1 tablet (10 mg total) by mouth every 6 (six) hours as needed for nausea. 12/29/23   Peter Garter, PA  Multiple Vitamin (MULTIVITAMIN WITH MINERALS) TABS tablet Take 1 tablet by mouth daily.    [provider]  nystatin cream (MYCOSTATIN) Apply 1 Application topically 2 (two) times daily as needed for dry skin (yeast).    [provider]  pregabalin (LYRICA) 100 MG capsule Take 1 capsule (100 mg total) by mouth 2 (two) times daily. 10/23/22   Lazaro Arms, MD  PREMARIN vaginal cream Insert 1 gram vaginally nightly. 09/05/23   Lazaro Arms, MD  promethazine (PHENERGAN) 12.5 MG tablet Take 12.5 mg by mouth every 6 (six) hours as needed for nausea or vomiting. 11/26/23   [provider]  scopolamine (TRANSDERM-SCOP) 1 MG/3DAYS Place 1 patch (1.5 mg total) onto the skin every 3 (three) days. 01/25/24   Eber Hong, MD  tiZANidine (ZANAFLEX) 4 MG tablet Take 4 mg by mouth every 6 (six) hours as needed for muscle spasms. 11/24/23   [provider]      Allergies    Apremilast, Cosentyx [secukinumab], Fentanyl, Hydroxychloroquine, Metoclopramide, Other, Prochlorperazine, Tape, Tegaderm ag mesh [silver], Latex, Zetia [ezetimibe], and Zofran [ondansetron hcl]    Review of Systems   Review of Systems  Cardiovascular:  Positive for chest pain and palpitations.  All other systems reviewed and are negative.   Physical Exam Updated Vital Signs BP 127/83   Pulse 95   Temp 97.9 F (36.6 C) (Temporal)   Resp 17   Ht 5\' 3"  (1.6 m)   Wt 68 kg   LMP 10/23/2016 (Approximate)   SpO2 97%   BMI 26.57 kg/m  Physical Exam Vitals and nursing note reviewed.   Gen: NAD Eyes: PERRL, EOMI HEENT: no oropharyngeal swelling Neck: trachea midline Resp: clear to auscultation bilaterally Card: Tachycardic, no murmurs, rubs, or gallops Abd: nontender, nondistended Extremities: no calf tenderness, no edema Vascular: 2+ radial pulses bilaterally, 2+ DP pulses bilaterally Neuro: No focal deficits Skin: no rashes Psyc: acting appropriately   ED Results / Procedures / Treatments   Labs (all labs ordered are listed, but only abnormal results are displayed) Labs Reviewed   COMPREHENSIVE METABOLIC PANEL - Abnormal; Notable for the following components:      Result Value   Potassium 3.3 (*)    AST 11 (*)    All other components within normal limits  URINALYSIS, ROUTINE W REFLEX MICROSCOPIC - Abnormal; Notable for the following components:   Color, Urine STRAW (*)    Specific Gravity, Urine 1.002 (*)    All other components within normal limits  D-DIMER, QUANTITATIVE - Abnormal; Notable for the following components:   D-Dimer, Quant 0.58 (*)    All other components within normal limits  TSH  CBC WITH DIFFERENTIAL/PLATELET  HCG, SERUM, QUALITATIVE  T4, FREE  THYROID STIMULATING IMMUNOGLOBULIN  TROPONIN I (HIGH SENSITIVITY)    EKG EKG Interpretation Date/Time:  Wednesday February 25 2024 18:03:42 EST Ventricular Rate:  133 PR Interval:  142 QRS Duration:  82 QT Interval:  236 QTC Calculation: 351  R Axis:   63  Text Interpretation: Sinus tachycardia Nonspecific ST abnormality Abnormal ECG When compared with ECG of 22-Apr-2023 13:48, Vent. rate has increased BY  45 BPM T wave inversion now evident in Lateral leads Confirmed by Beckey Downing [phone]) on 02/25/2024 6:12:34 PM  Radiology No results found.  Procedures Procedures    Medications Ordered in ED Medications  potassium chloride SA (KLOR-CON M) CR tablet 40 mEq (has no administration in time range)  lactated ringers bolus 1,000 mL (1,000 mLs Intravenous Bolus 02/25/24 1857)  lactated ringers bolus 1,000 mL (0 mLs Intravenous Stopped 02/25/24 2305)  iohexol (OMNIPAQUE) 350 MG/ML injection 75 mL (75 mLs Intravenous Contrast Given 02/25/24 2148)    ED Course/ Medical Decision Making/ A&P                                 Medical Decision Making 38 year old female with past medical history of Ehlers-Danlos syndrome, Hashimoto's thyroiditis, and lupus presenting to the emergency department today with tachycardia and some mild dyspnea on exertion.  I will further evaluate patient here with basic labs  Wels and EKG, chest x-ray, and troponin for further evaluation for ACS, pulmonary edema, pulmonary filtration, pneumothorax, myocarditis, or pericarditis.  Will also obtain a D-dimer to evaluate for pulmonary embolism.  I will obtain thyroid testing on the patient.  Will give her IV fluids for the tachycardia.  I will reevaluate for ultimate disposition.  The patient's initial labs are reassuring.  Troponin is pending at the time of signout and the addition to the patient's thyroid panel.  Her initial TSH is within normal limits.  I did speak with the patient.  Her D-dimer is elevated.  CT angiogram is pending as well.  She does report that she normally is on 20 mg/day prednisone for lupus flares.  The plan will be to discharge the patient with prednisone as her heart rate has improved with the remainder of her labs and imaging studies are unremarkable.  Amount and/or Complexity of Data Reviewed Labs: ordered. Radiology: ordered.  Risk Prescription drug management.          Final Clinical Impression(s) / ED Diagnoses Final diagnoses:  Exacerbation of systemic lupus (HCC)  Tachycardia    Rx / DC Orders ED Discharge Orders          Ordered    predniSONE (DELTASONE) 10 MG tablet        02/25/24 2325              Durwin Glaze, MD 02/25/24 2327

## 2024-02-26 DIAGNOSIS — M329 Systemic lupus erythematosus, unspecified: Secondary | ICD-10-CM | POA: Diagnosis not present

## 2024-02-26 LAB — THYROID STIMULATING IMMUNOGLOBULIN: Thyroid Stimulating Immunoglob: <0.1 IU/L (ref 0.00–0.55)

## 2024-02-26 LAB — T4, FREE: Free T4: 1.11 ng/dL (ref 0.61–1.12)

## 2024-02-26 LAB — TROPONIN I (HIGH SENSITIVITY): Troponin I (High Sensitivity): <2 ng/L (ref <18)

## 2024-02-26 NOTE — ED Provider Notes (Signed)
  Provider Note MRN:  [id number]  Arrival date & time: 02/26/24    ED Course and Medical Decision Making  Assumed care of patient at sign-out or upon transfer.  On my assessment patient's tachycardia has resolved, she is feeling better, PE study is negative, she feels like she is in a lupus flare.  She is comfortable having her primary care doctor follow-up on her T3 and T4 levels.  Appropriate for discharge.  Procedures  Final Clinical Impressions(s) / ED Diagnoses     ICD-10-CM   1. Exacerbation of systemic lupus (HCC)  M32.9     2. Tachycardia  R00.0       ED Discharge Orders          Ordered    predniSONE (DELTASONE) 10 MG tablet        02/25/24 2325              Discharge Instructions      Your workup today was reassuring.  Please start the prednisone as prescribed and follow-up with your specialist.  Return to the ER for worsening symptoms.    Elmer Sow. Pilar Plate, MD [facility] Emergency Medicine [facility] [email or web address]    Sabas Sous, MD 02/26/24 [phone]

## 2024-03-06 ENCOUNTER — Encounter (HOSPITAL_COMMUNITY): Payer: Self-pay | Admitting: Emergency Medicine

## 2024-03-06 ENCOUNTER — Other Ambulatory Visit: Payer: Self-pay

## 2024-03-06 ENCOUNTER — Emergency Department (HOSPITAL_COMMUNITY)
Admission: EM | Admit: 2024-03-06 | Discharge: 2024-03-07 | Disposition: A | Discharge status: Home / Self Care | Attending: Emergency Medicine | Admitting: Emergency Medicine

## 2024-03-06 ENCOUNTER — Emergency Department (HOSPITAL_COMMUNITY)

## 2024-03-06 DIAGNOSIS — R634 Abnormal weight loss: Secondary | ICD-10-CM | POA: Insufficient documentation

## 2024-03-06 DIAGNOSIS — R5383 Other fatigue: Secondary | ICD-10-CM | POA: Diagnosis not present

## 2024-03-06 DIAGNOSIS — R110 Nausea: Secondary | ICD-10-CM | POA: Diagnosis not present

## 2024-03-06 DIAGNOSIS — Z91040 Latex allergy status: Secondary | ICD-10-CM | POA: Diagnosis not present

## 2024-03-06 DIAGNOSIS — R1011 Right upper quadrant pain: Secondary | ICD-10-CM | POA: Diagnosis present

## 2024-03-06 DIAGNOSIS — Z7982 Long term (current) use of aspirin: Secondary | ICD-10-CM | POA: Insufficient documentation

## 2024-03-06 LAB — COMPREHENSIVE METABOLIC PANEL
ALT: 12 U/L (ref 0–44)
AST: 9 U/L — ABNORMAL LOW (ref 15–41)
Albumin: 4.2 g/dL (ref 3.5–5.0)
Alkaline Phosphatase: 56 U/L (ref 38–126)
Anion gap: 7 (ref 5–15)
BUN: 5 mg/dL — ABNORMAL LOW (ref 6–20)
CO2: 24 mmol/L (ref 22–32)
Calcium: 9.6 mg/dL (ref 8.9–10.3)
Chloride: 105 mmol/L (ref 98–111)
Creatinine, Ser: 0.82 mg/dL (ref 0.44–1.00)
GFR, Estimated: >60 mL/min (ref >60)
Glucose, Bld: 101 mg/dL — ABNORMAL HIGH (ref 70–99)
Potassium: 3.3 mmol/L — ABNORMAL LOW (ref 3.5–5.1)
Sodium: 136 mmol/L (ref 135–145)
Total Bilirubin: 0.6 mg/dL (ref 0.0–1.2)
Total Protein: 7.1 g/dL (ref 6.5–8.1)

## 2024-03-06 LAB — LIPASE, BLOOD: Lipase: 26 U/L (ref 11–51)

## 2024-03-06 LAB — CBC
HCT: 39.6 % (ref 36.0–46.0)
Hemoglobin: 13.2 g/dL (ref 12.0–15.0)
MCH: 27 pg (ref 26.0–34.0)
MCHC: 33.3 g/dL (ref 30.0–36.0)
MCV: 81.1 fL (ref 80.0–100.0)
Platelets: 281 10*3/uL (ref 150–400)
RBC: 4.88 MIL/uL (ref 3.87–5.11)
RDW: 13.9 % (ref 11.5–15.5)
WBC: 8.4 10*3/uL (ref 4.0–10.5)
nRBC: 0 % (ref 0.0–0.2)

## 2024-03-06 LAB — I-STAT CG4 LACTIC ACID, ED: Lactic Acid, Venous: 0.9 mmol/L (ref 0.5–1.9)

## 2024-03-06 NOTE — ED Triage Notes (Signed)
 Patient coming to ED for evaluation of epigastric abdominal pain, decreased PO intake, dehydration, nausea, and chills s/p gallbladder removal in January.  Pt reports she has "just not felt right since having surgery." Has been having intermittent "shooting" pain.  Unable to eat.  No reports of fever. Has lost 23 pounds since surgery.

## 2024-03-06 NOTE — ED Provider Notes (Signed)
 [place] EMERGENCY DEPARTMENT AT [facility] Provider Note   CSN: [id number] Arrival date & time: 03/06/24  2041     History {Add pertinent medical, surgical, social history, OB history to HPI:1} Chief Complaint  Patient presents with   Abdominal Pain    Sharon Rowland is a 38 y.o. female.  HPI     Home Medications Prior to Admission medications   Medication Sig Start Date End Date Taking? Authorizing Provider  albuterol (VENTOLIN HFA) 108 (90 Base) MCG/ACT inhaler Inhale 1-2 puffs into the lungs every 6 (six) hours as needed for wheezing or shortness of breath.    [provider]  aspirin 81 MG chewable tablet Chew 81 mg by mouth daily. 11/24/23   [provider]  Atogepant (QULIPTA) 60 MG TABS Take 60 mg by mouth daily.    [provider]  betamethasone dipropionate 0.05 % cream Apply 1 Application topically 2 (two) times daily as needed (irritation).    [provider]  botulinum toxin Type A (BOTOX) 200 units injection Inject 200 Units as directed every 3 (three) months.    [provider]  buPROPion (WELLBUTRIN XL) 300 MG 24 hr tablet Take 1 tablet by mouth daily. 09/05/23   Lazaro Arms, MD  calcium carbonate (TUMS EX) 750 MG chewable tablet Chew 1 tablet by mouth as needed for heartburn.    [provider]  cholecalciferol (VITAMIN D3) 25 MCG (1000 UNIT) tablet Take 1,000 Units by mouth daily.    [provider]  clobetasol cream (TEMOVATE) 0.05 % Apply 1 Application topically daily as needed (irritation).    [provider]  desonide (DESOWEN) 0.05 % ointment Apply 1 application  topically 2 (two) times daily as needed (irritation). 04/24/20   [provider]  diclofenac (VOLTAREN) 50 MG EC tablet Take 2 tablets by mouth twice daily. 09/05/23   Lazaro Arms, MD  docusate sodium (COLACE) 100 MG capsule Take 1 capsule (100 mg total) by mouth 2 (two) times daily. 01/19/24 01/18/25   Pappayliou, Santina Evans A, DO  Guselkumab (TREMFYA) 100 MG/ML SOPN Inject 1 Dose into the skin every 30 (thirty) days.    [provider]  levothyroxine (SYNTHROID) 88 MCG tablet Take 88 mcg by mouth daily before breakfast. 10/28/22   [provider]  methylphenidate 10 MG ER tablet TAKE ONE TABLET BY MOUTH IN THE MORNING 04/18/23   Lazaro Arms, MD  metoCLOPramide (REGLAN) 10 MG tablet Take 1 tablet (10 mg total) by mouth every 6 (six) hours as needed for nausea. 12/29/23   Peter Garter, PA  Multiple Vitamin (MULTIVITAMIN WITH MINERALS) TABS tablet Take 1 tablet by mouth daily.    [provider]  naproxen (NAPROSYN) 500 MG tablet Take 500 mg by mouth 2 (two) times daily. 11/24/23   [provider]  nystatin cream (MYCOSTATIN) Apply 1 Application topically 2 (two) times daily as needed for dry skin (yeast).    [provider]  predniSONE (DELTASONE) 10 MG tablet Take 1 tablet by mouth daily for 10 days 02/25/24   Durwin Glaze, MD  pregabalin (LYRICA) 100 MG capsule Take 1 capsule (100 mg total) by mouth 2 (two) times daily. 10/23/22   Lazaro Arms, MD  PREMARIN vaginal cream Insert 1 gram vaginally nightly. 09/05/23   Lazaro Arms, MD  promethazine (PHENERGAN) 12.5 MG tablet Take 12.5 mg by mouth every 6 (six) hours as needed for nausea or vomiting. 11/26/23   [provider]  scopolamine (TRANSDERM-SCOP) 1 MG/3DAYS Place 1 patch (1.5 mg total) onto the skin every 3 (three) days. 01/25/24   Eber Hong, MD  tiZANidine (ZANAFLEX) 4 MG tablet Take 4 mg by mouth every 6 (six) hours as needed for muscle spasms. 11/24/23   [provider]      Allergies    Apremilast, Cosentyx [secukinumab], Fentanyl, Hydroxychloroquine, Metoclopramide, Other, Prochlorperazine, Tape, Tegaderm ag mesh [silver], Latex, Zetia [ezetimibe], and Zofran [ondansetron hcl]    Review of Systems   Review of Systems  Physical Exam Updated Vital Signs BP 122/82    Pulse (!) 102   Resp 16   Ht 5\' 3"  (1.6 m)   Wt 65.8 kg   LMP 10/23/2016 (Approximate)   SpO2 99%   BMI 25.69 kg/m  Physical Exam  ED Results / Procedures / Treatments   Labs (all labs ordered are listed, but only abnormal results are displayed) Labs Reviewed - No data to display  EKG EKG Interpretation Date/Time:  Saturday March 06 2024 20:52:56 EDT Ventricular Rate:  116 PR Interval:  178 QRS Duration:  88 QT Interval:  300 QTC Calculation: 417 R Axis:   85  Text Interpretation: Sinus tachycardia Right atrial enlargement Low voltage, precordial leads Abnormal T, consider ischemia, diffuse leads No significant change since last tracing on 02/25/24 Confirmed by Gwyneth Sprout ([id number]) on 03/06/2024 9:02:05 PM  Radiology No results found.  Procedures Procedures  {Document cardiac monitor, telemetry assessment procedure when appropriate:1}  Medications Ordered in ED Medications - No data to display  ED Course/ Medical Decision Making/ A&P   {   Click here for ABCD2, HEART and other calculatorsREFRESH Note before signing :1}                              Medical Decision Making  ***  {Document critical care time when appropriate:1} {Document review of labs and clinical decision tools ie heart score, Chads2Vasc2 etc:1}  {Document your independent review of radiology images, and any outside records:1} {Document your discussion with family members, caretakers, and with consultants:1} {Document social determinants of health affecting pt's care:1} {Document your decision making why or why not admission, treatments were needed:1} Final Clinical Impression(s) / ED Diagnoses Final diagnoses:  None    Rx / DC Orders ED Discharge Orders     None

## 2024-03-06 NOTE — ED Notes (Signed)
 Patient taken to ultrasound at this time.

## 2024-03-07 DIAGNOSIS — R1011 Right upper quadrant pain: Secondary | ICD-10-CM | POA: Diagnosis not present

## 2024-03-07 LAB — URINALYSIS, ROUTINE W REFLEX MICROSCOPIC
Bilirubin Urine: NEGATIVE
Glucose, UA: NEGATIVE mg/dL
Hgb urine dipstick: NEGATIVE
Ketones, ur: 5 mg/dL — AB
Leukocytes,Ua: NEGATIVE
Nitrite: NEGATIVE
Protein, ur: NEGATIVE mg/dL
Specific Gravity, Urine: 1.004 — ABNORMAL LOW (ref 1.005–1.030)
pH: 7 (ref 5.0–8.0)

## 2024-03-07 MED ORDER — KETOROLAC TROMETHAMINE 15 MG/ML IJ SOLN
15.0000 mg | Freq: Once | INTRAMUSCULAR | Status: AC
  Administered 2024-03-07: 15 mg via INTRAVENOUS
  Filled 2024-03-07: qty 1

## 2024-03-07 MED ORDER — SODIUM CHLORIDE 0.9 % IV BOLUS
1000.0000 mL | Freq: Once | INTRAVENOUS | Status: AC
  Administered 2024-03-07: 1000 mL via INTRAVENOUS

## 2024-03-07 MED ORDER — POTASSIUM CHLORIDE CRYS ER 20 MEQ PO TBCR
40.0000 meq | EXTENDED_RELEASE_TABLET | Freq: Once | ORAL | Status: AC
  Administered 2024-03-07: 40 meq via ORAL
  Filled 2024-03-07: qty 2

## 2024-03-07 NOTE — Discharge Instructions (Signed)
 There was no exact cause of your symptoms 5 today, your workup was very reassuring.  Please follow-up with your rheumatologist.  Return to the ER with any new severe symptoms.

## 2024-03-10 DIAGNOSIS — R1011 Right upper quadrant pain: Principal | ICD-10-CM

## 2024-03-11 ENCOUNTER — Ambulatory Visit: Admit: 2024-03-11 | Discharge: 2024-03-12 | Payer: BLUE CROSS/BLUE SHIELD

## 2024-03-11 ENCOUNTER — Inpatient Hospital Stay: Admit: 2024-03-11 | Discharge: 2024-03-12 | Payer: BLUE CROSS/BLUE SHIELD

## 2024-03-12 ENCOUNTER — Encounter: Payer: Self-pay | Admitting: Obstetrics & Gynecology

## 2024-03-15 ENCOUNTER — Encounter (HOSPITAL_COMMUNITY): Payer: Self-pay

## 2024-03-15 ENCOUNTER — Other Ambulatory Visit: Payer: Self-pay

## 2024-03-15 ENCOUNTER — Emergency Department (HOSPITAL_COMMUNITY)

## 2024-03-15 ENCOUNTER — Emergency Department (HOSPITAL_COMMUNITY)
Admission: EM | Admit: 2024-03-15 | Discharge: 2024-03-16 | Disposition: A | Discharge status: Home / Self Care | Attending: Emergency Medicine | Admitting: Emergency Medicine

## 2024-03-15 DIAGNOSIS — E876 Hypokalemia: Secondary | ICD-10-CM | POA: Insufficient documentation

## 2024-03-15 DIAGNOSIS — Z91040 Latex allergy status: Secondary | ICD-10-CM | POA: Diagnosis not present

## 2024-03-15 DIAGNOSIS — J45909 Unspecified asthma, uncomplicated: Secondary | ICD-10-CM | POA: Diagnosis not present

## 2024-03-15 DIAGNOSIS — R109 Unspecified abdominal pain: Secondary | ICD-10-CM | POA: Diagnosis present

## 2024-03-15 DIAGNOSIS — E039 Hypothyroidism, unspecified: Secondary | ICD-10-CM | POA: Insufficient documentation

## 2024-03-15 DIAGNOSIS — R1033 Periumbilical pain: Secondary | ICD-10-CM | POA: Insufficient documentation

## 2024-03-15 DIAGNOSIS — Z7982 Long term (current) use of aspirin: Secondary | ICD-10-CM | POA: Diagnosis not present

## 2024-03-15 DIAGNOSIS — R110 Nausea: Secondary | ICD-10-CM | POA: Insufficient documentation

## 2024-03-15 LAB — COMPREHENSIVE METABOLIC PANEL
ALT: 11 U/L (ref 0–44)
AST: 10 U/L — ABNORMAL LOW (ref 15–41)
Albumin: 4.4 g/dL (ref 3.5–5.0)
Alkaline Phosphatase: 63 U/L (ref 38–126)
Anion gap: 10 (ref 5–15)
BUN: 6 mg/dL (ref 6–20)
CO2: 24 mmol/L (ref 22–32)
Calcium: 9.4 mg/dL (ref 8.9–10.3)
Chloride: 105 mmol/L (ref 98–111)
Creatinine, Ser: 0.74 mg/dL (ref 0.44–1.00)
GFR, Estimated: >60 mL/min (ref >60)
Glucose, Bld: 103 mg/dL — ABNORMAL HIGH (ref 70–99)
Potassium: 2.9 mmol/L — ABNORMAL LOW (ref 3.5–5.1)
Sodium: 139 mmol/L (ref 135–145)
Total Bilirubin: 0.6 mg/dL (ref 0.0–1.2)
Total Protein: 7.5 g/dL (ref 6.5–8.1)

## 2024-03-15 LAB — CBC
HCT: 39 % (ref 36.0–46.0)
Hemoglobin: 13 g/dL (ref 12.0–15.0)
MCH: 27.3 pg (ref 26.0–34.0)
MCHC: 33.3 g/dL (ref 30.0–36.0)
MCV: 81.9 fL (ref 80.0–100.0)
Platelets: 321 10*3/uL (ref 150–400)
RBC: 4.76 MIL/uL (ref 3.87–5.11)
RDW: 14 % (ref 11.5–15.5)
WBC: 8.4 10*3/uL (ref 4.0–10.5)
nRBC: 0 % (ref 0.0–0.2)

## 2024-03-15 LAB — URINALYSIS, ROUTINE W REFLEX MICROSCOPIC
Bilirubin Urine: NEGATIVE
Glucose, UA: NEGATIVE mg/dL
Hgb urine dipstick: NEGATIVE
Ketones, ur: NEGATIVE mg/dL
Leukocytes,Ua: NEGATIVE
Nitrite: NEGATIVE
Protein, ur: NEGATIVE mg/dL
Specific Gravity, Urine: 1.001 — ABNORMAL LOW (ref 1.005–1.030)
pH: 6 (ref 5.0–8.0)

## 2024-03-15 LAB — LIPASE, BLOOD: Lipase: 31 U/L (ref 11–51)

## 2024-03-15 LAB — MAGNESIUM: Magnesium: 2.2 mg/dL (ref 1.7–2.4)

## 2024-03-15 MED ORDER — LACTATED RINGERS IV BOLUS
1000.0000 mL | Freq: Once | INTRAVENOUS | Status: AC
  Administered 2024-03-15: 1000 mL via INTRAVENOUS

## 2024-03-15 MED ORDER — POTASSIUM CHLORIDE 20 MEQ PO PACK
40.0000 meq | PACK | Freq: Once | ORAL | Status: AC
  Administered 2024-03-15: 40 meq via ORAL
  Filled 2024-03-15: qty 2

## 2024-03-15 MED ORDER — SODIUM CHLORIDE 0.9 % IV BOLUS: 1000.0000 mL | Freq: Once | INTRAVENOUS | Status: DC

## 2024-03-15 MED ORDER — KETOROLAC TROMETHAMINE 15 MG/ML IJ SOLN
15.0000 mg | Freq: Once | INTRAMUSCULAR | Status: AC
  Administered 2024-03-15: 15 mg via INTRAVENOUS
  Filled 2024-03-15: qty 1

## 2024-03-15 MED ORDER — MORPHINE SULFATE (PF) 4 MG/ML IV SOLN: 4.0000 mg | Freq: Once | INTRAVENOUS | Status: DC

## 2024-03-15 MED ORDER — IOHEXOL 300 MG/ML  SOLN
100.0000 mL | Freq: Once | INTRAMUSCULAR | Status: AC | PRN
  Administered 2024-03-15: 100 mL via INTRAVENOUS

## 2024-03-15 NOTE — ED Provider Notes (Signed)
 [facility] Provider Note   CSN: [id number] Arrival date & time: 03/15/24  2135     History  Chief Complaint  Patient presents with   Abdominal Pain    Sharon Rowland is a 38 y.o. female.   Abdominal Pain Associated symptoms: nausea   Patient presents for abdominal pain.  Medical history includes depression, asthma, anemia, hypothyroidism, migraine headaches, arthritis, POTS, Ehlers-Danlos syndrome, Sjogren syndrome, lupus, gastritis, GERD.  She underwent laparoscopic cholecystectomy in January.  She feels that this was the start of her ongoing abdominal complaints.  She was seen at outside hospital a week ago for postprandial epigastric pain and dysphagia.  She was admitted there for 2 days.  She states that they encouraged her to take antiemetics which she has adverse reactions to.  There was concern of slow GI motility.  Patient states that she has normal bowel movements.  She was dissatisfied with the care that she received at [facility].  Since her discharge, she has had ongoing abdominal discomfort.  She describes current areas of pain as periumbilical as well as under her right shoulder blade.  She has had ongoing nausea without vomiting.  Because of the nausea, she has poor p.o. intake with food.  She states that she has been able to drink fluids.  She has undergone hysterectomy in the past due to prolapse.  At [facility] last week, she underwent pelvic ultrasound which showed a large right ovarian cyst.  There was some concern of intermittent torsion, however, no evidence of torsion on ultrasound at the time.  She has AN appointment with her OB/GYN, Dr. Despina Hidden, on Thursday.       Home Medications Prior to Admission medications   Medication Sig Start Date End Date Taking? Authorizing Provider  albuterol (VENTOLIN HFA) 108 (90 Base) MCG/ACT inhaler Inhale 1-2 puffs into the lungs every 6 (six) hours as needed for wheezing or shortness of breath.     [provider]  aspirin 81 MG chewable tablet Chew 81 mg by mouth daily. 11/24/23   [provider]  Atogepant (QULIPTA) 60 MG TABS Take 60 mg by mouth daily.    [provider]  betamethasone dipropionate 0.05 % cream Apply 1 Application topically 2 (two) times daily as needed (irritation).    [provider]  botulinum toxin Type A (BOTOX) 200 units injection Inject 200 Units as directed every 3 (three) months.    [provider]  buPROPion (WELLBUTRIN XL) 300 MG 24 hr tablet Take 1 tablet by mouth daily. 09/05/23   Lazaro Arms, MD  calcium carbonate (TUMS EX) 750 MG chewable tablet Chew 1 tablet by mouth as needed for heartburn.    [provider]  cholecalciferol (VITAMIN D3) 25 MCG (1000 UNIT) tablet Take 1,000 Units by mouth daily.    [provider]  clobetasol cream (TEMOVATE) 0.05 % Apply 1 Application topically daily as needed (irritation).    [provider]  desonide (DESOWEN) 0.05 % ointment Apply 1 application  topically 2 (two) times daily as needed (irritation). 04/24/20   [provider]  diclofenac (VOLTAREN) 50 MG EC tablet Take 2 tablets by mouth twice daily. 09/05/23   Lazaro Arms, MD  docusate sodium (COLACE) 100 MG capsule Take 1 capsule (100 mg total) by mouth 2 (two) times daily. 01/19/24 01/18/25  Pappayliou, Santina Evans A, DO  Guselkumab (TREMFYA) 100 MG/ML SOPN Inject 1 Dose into the skin every 30 (thirty) days.  [provider]  levothyroxine (SYNTHROID) 88 MCG tablet Take 88 mcg by mouth daily before breakfast. 10/28/22   [provider]  methylphenidate 10 MG ER tablet TAKE ONE TABLET BY MOUTH IN THE MORNING 04/18/23   Lazaro Arms, MD  metoCLOPramide (REGLAN) 10 MG tablet Take 1 tablet (10 mg total) by mouth every 6 (six) hours as needed for nausea. 12/29/23   Peter Garter, PA  Multiple Vitamin (MULTIVITAMIN WITH MINERALS) TABS tablet Take 1 tablet by mouth daily.     [provider]  naproxen (NAPROSYN) 500 MG tablet Take 500 mg by mouth 2 (two) times daily. 11/24/23   [provider]  nystatin cream (MYCOSTATIN) Apply 1 Application topically 2 (two) times daily as needed for dry skin (yeast).    [provider]  predniSONE (DELTASONE) 10 MG tablet Take 1 tablet by mouth daily for 10 days 02/25/24   Durwin Glaze, MD  pregabalin (LYRICA) 100 MG capsule Take 1 capsule (100 mg total) by mouth 2 (two) times daily. 10/23/22   Lazaro Arms, MD  PREMARIN vaginal cream Insert 1 gram vaginally nightly. 09/05/23   Lazaro Arms, MD  promethazine (PHENERGAN) 12.5 MG tablet Take 12.5 mg by mouth every 6 (six) hours as needed for nausea or vomiting. 11/26/23   [provider]  scopolamine (TRANSDERM-SCOP) 1 MG/3DAYS Place 1 patch (1.5 mg total) onto the skin every 3 (three) days. 01/25/24   Eber Hong, MD  tiZANidine (ZANAFLEX) 4 MG tablet Take 4 mg by mouth every 6 (six) hours as needed for muscle spasms. 11/24/23   [provider]      Allergies    Apremilast, Cosentyx [secukinumab], Fentanyl, Hydroxychloroquine, Metoclopramide, Other, Prochlorperazine, Tape, Tegaderm ag mesh [silver], Latex, Zetia [ezetimibe], and Zofran [ondansetron hcl]    Review of Systems   Review of Systems  Gastrointestinal:  Positive for abdominal distention, abdominal pain and nausea.  All other systems reviewed and are negative.   Physical Exam Updated Vital Signs BP (!) 136/99 (BP Location: Right Arm)   Pulse (!) 117   Temp 99.2 F (37.3 C) (Oral)   Resp 20   Ht 5\' 3"  (1.6 m)   Wt 65.7 kg   LMP 10/23/2016 (Approximate)   SpO2 99%   BMI 25.66 kg/m  Physical Exam Vitals and nursing note reviewed.  Constitutional:      General: She is not in acute distress.    Appearance: She is well-developed. She is not ill-appearing, toxic-appearing or diaphoretic.  HENT:     Head: Normocephalic and atraumatic.     Mouth/Throat:     Mouth: Mucous  membranes are moist.  Eyes:     Extraocular Movements: Extraocular movements intact.     Conjunctiva/sclera: Conjunctivae normal.  Cardiovascular:     Rate and Rhythm: Normal rate and regular rhythm.     Heart sounds: No murmur heard. Pulmonary:     Effort: Pulmonary effort is normal. No respiratory distress.  Abdominal:     General: Abdomen is flat.     Palpations: Abdomen is soft.     Tenderness: There is abdominal tenderness in the periumbilical area. There is no guarding or rebound.  Musculoskeletal:        General: No swelling.     Cervical back: Neck supple.  Skin:    General: Skin is warm and dry.     Coloration: Skin is not cyanotic, jaundiced or pale.  Neurological:     General: No focal deficit  present.     Mental Status: She is alert and oriented to person, place, and time.  Psychiatric:        Mood and Affect: Mood normal.        Behavior: Behavior normal.     ED Results / Procedures / Treatments   Labs (all labs ordered are listed, but only abnormal results are displayed) Labs Reviewed  COMPREHENSIVE METABOLIC PANEL - Abnormal; Notable for the following components:      Result Value   Potassium 2.9 (*)    Glucose, Bld 103 (*)    AST 10 (*)    All other components within normal limits  URINALYSIS, ROUTINE W REFLEX MICROSCOPIC - Abnormal; Notable for the following components:   Color, Urine COLORLESS (*)    Specific Gravity, Urine 1.001 (*)    All other components within normal limits  LIPASE, BLOOD  CBC  MAGNESIUM    EKG None  Radiology No results found.  Procedures Procedures    Medications Ordered in ED Medications  iohexol (OMNIPAQUE) 300 MG/ML solution 100 mL (has no administration in time range)  potassium chloride (KLOR-CON) packet 40 mEq (has no administration in time range)  lactated ringers bolus 1,000 mL (1,000 mLs Intravenous New Bag/Given 03/15/24 2319)  ketorolac (TORADOL) 15 MG/ML injection 15 mg (15 mg Intravenous Given 03/15/24  2319)    ED Course/ Medical Decision Making/ A&P                                 Medical Decision Making Amount and/or Complexity of Data Reviewed Labs: ordered. Radiology: ordered.  Risk Prescription drug management.   This patient presents to the ED for concern of abdominal pain and nausea, this involves an extensive number of treatment options, and is a complaint that carries with it a high risk of complications and morbidity.  The differential diagnosis includes enteritis, gastritis, ruptured ovarian cyst, SBO, ileus   Co morbidities that complicate the patient evaluation  depression, asthma, anemia, hypothyroidism, migraine headaches, arthritis, POTS, Ehlers-Danlos syndrome, Sjogren syndrome, lupus, gastritis, GERD   Additional history obtained:  Additional history obtained from N/A External records from outside source obtained and reviewed including EMR   Lab Tests:  I Ordered, and personally interpreted labs.  The pertinent results include: Normal hemoglobin, no leukocytosis, normal kidney function.  Hypokalemia is present with otherwise normal electrolytes.  No evidence of UTI.   Imaging Studies ordered:  I ordered imaging studies including CT of abdomen and pelvis I independently visualized and interpreted imaging which showed.  This pending at time of signout) I agree with the radiologist interpretation   Cardiac Monitoring: / EKG:  The patient was maintained on a cardiac monitor.  I personally viewed and interpreted the cardiac monitored which showed an underlying rhythm of: Sinus rhythm   Problem List / ED Course / Critical interventions / Medication management  Patient presenting for worsening abdominal discomfort.  This discomfort has been present for the past 2 months following a laparoscopic cholecystectomy.  She was hospitalized last week for similar symptoms.  She was not discharged on any new prescriptions.  Patient reports ongoing abdominal  discomfort, centered around umbilicus.  She has had nausea and poor food intake.  She is currently wearing a scopolamine patch which she states helps with her nausea.  She states that she has had poor reaction to other antiemetics.  Currently, she requests some Toradol for her pain.  This was ordered.  Given her poor p.o. intake, IV fluids ordered for hydration.  Workup was initiated.  Lab work normal for hypokalemia.  Replacement potassium was ordered.  CT imaging was pending at time of signout.  Care of patient was signed out to oncoming ED provider.. I ordered medication including IV fluids for hydration; Toradol for analgesia; potassium chloride for hypokalemia Reevaluation of the patient after these medicines showed that the patient improved I have reviewed the patients home medicines and have made adjustments as needed   Social Determinants of Health:  Has PCP        Final Clinical Impression(s) / ED Diagnoses Final diagnoses:  Nausea  Periumbilical abdominal pain  Hypokalemia    Rx / DC Orders ED Discharge Orders     None         Gloris Manchester, MD 03/15/24 2323

## 2024-03-15 NOTE — ED Triage Notes (Addendum)
 Pt reports pain in lower abd, pain in upper abd, pain in right shoulder, belching, says she was admitted to [facility], had  pelvic US and CT scan of abdomen and was told she had large hemorraghic ovarian cyst with intermittent torsion. Pt has these results on her "my chart", this nurse unable to results on Epic.

## 2024-03-16 ENCOUNTER — Emergency Department (HOSPITAL_COMMUNITY)

## 2024-03-16 DIAGNOSIS — R1033 Periumbilical pain: Secondary | ICD-10-CM | POA: Diagnosis not present

## 2024-03-16 MED ORDER — PANTOPRAZOLE SODIUM 40 MG IV SOLR
40.0000 mg | Freq: Once | INTRAVENOUS | Status: AC
  Administered 2024-03-16: 40 mg via INTRAVENOUS
  Filled 2024-03-16: qty 10

## 2024-03-16 NOTE — ED Provider Notes (Signed)
 Patient signed out to me by Dr. Durwin Nora.  CT pending at time of signout.  CT report reviewed, no acute findings, bilateral simple ovarian cysts noted.  Findings discussed with patient.  She is concerned that the CT reports simple cysts and she was told that she had septations and hemorrhagic cysts at [facility].  She is still experiencing pain and is concerned that something is seriously wrong.  Patient concerned because she was told at [facility] that she may be intermittently coursing.  She is requesting repeat ultrasound.  Technician called into perform torsion rule out.  Ultrasound performed, no evidence of torsion:  Right ovary   Measurements: 5.9 x 3.7 x 5.0 cm. = volume: 57.2 mL. 4.8 cm mildly complex cystic lesion is identified similar to that seen on the prior exam. A daughter cyst is noted within. These changes likely represent a hemorrhagic cyst. No evidence of torsion is seen.   Left ovary   Measurements: 3.0 x 1.7 x 2.4 cm = volume: 6.8 mL. 3.3 cm simple cyst is noted within the left ovary.   Pulsed Doppler evaluation of both ovaries demonstrates normal low-resistance arterial and venous waveforms.  Results printed for the patient at her request.     Gilda Crease, MD 03/16/24 [phone]

## 2024-03-18 ENCOUNTER — Encounter: Payer: Self-pay | Admitting: Obstetrics & Gynecology

## 2024-03-18 ENCOUNTER — Ambulatory Visit: Admitting: Obstetrics & Gynecology

## 2024-03-18 VITALS — BP 107/76 | HR 87 | Ht 63.0 in | Wt 154.5 lb

## 2024-03-18 DIAGNOSIS — N906 Unspecified hypertrophy of vulva: Secondary | ICD-10-CM | POA: Diagnosis not present

## 2024-03-18 DIAGNOSIS — N83201 Unspecified ovarian cyst, right side: Secondary | ICD-10-CM

## 2024-03-18 DIAGNOSIS — Z8639 Personal history of other endocrine, nutritional and metabolic disease: Secondary | ICD-10-CM

## 2024-03-18 MED ORDER — MEGESTROL ACETATE 40 MG PO TABS: 40.0000 mg | ORAL_TABLET | Freq: Every day | ORAL | 0 refills | Status: DC

## 2024-03-18 NOTE — Progress Notes (Signed)
 Follow up appointment for results: Right ov cyst  Chief Complaint  Patient presents with   Follow-up    Blood pressure 107/76, pulse 87, height 5\' 3"  (1.6 m), weight 154 lb 8 oz (70.1 kg), last menstrual period 10/23/2016.  US PELVIC COMPLETE W TRANSVAGINAL AND TORSION R/O Result Date: 03/16/2024 CLINICAL DATA:  Bilateral ovarian cysts with pelvic pain, initial EN EXAM: TRANSABDOMINAL AND TRANSVAGINAL ULTRASOUND OF PELVIS DOPPLER ULTRASOUND OF OVARIES TECHNIQUE: Both transabdominal and transvaginal ultrasound examinations of the pelvis were performed. Transabdominal technique was performed for global imaging of the pelvis including uterus, ovaries, adnexal regions, and pelvic cul-de-sac. It was necessary to proceed with endovaginal exam following the transabdominal exam to visualize the ovaries. Color and duplex Doppler ultrasound was utilized to evaluate blood flow to the ovaries. COMPARISON:  None Available. FINDINGS: Uterus Surgically removed Right ovary Measurements: 5.9 x 3.7 x 5.0 cm. = volume: 57.2 mL. 4.8 cm mildly complex cystic lesion is identified similar to that seen on the prior exam. A daughter cyst is noted within. These changes likely represent a hemorrhagic cyst. No evidence of torsion is seen. Left ovary Measurements: 3.0 x 1.7 x 2.4 cm = volume: 6.8 mL. 3.3 cm simple cyst is noted within the left ovary. Pulsed Doppler evaluation of both ovaries demonstrates normal low-resistance arterial and venous waveforms. Other findings No abnormal free fluid. IMPRESSION: Bilateral ovarian cysts as described. Follow-up ultrasound in 6-12 weeks is recommended for evaluation of the right mildly complex ovarian cyst. No evidence of ovarian torsion. Electronically Signed   By: Alcide Clever M.D.   On: 03/16/2024 01:25   CT ABDOMEN PELVIS W CONTRAST Result Date: 03/15/2024 CLINICAL DATA:  Acute nonlocalized abdominal pain. Lower abdominal pain, upper abdominal pain, right shoulder pain, and belching.  EXAM: CT ABDOMEN AND PELVIS WITH CONTRAST TECHNIQUE: Multidetector CT imaging of the abdomen and pelvis was performed using the standard protocol following bolus administration of intravenous contrast. RADIATION DOSE REDUCTION: This exam was performed according to the departmental dose-optimization program which includes automated exposure control, adjustment of the mA and/or kV according to patient size and/or use of iterative reconstruction technique. CONTRAST:  OMNIPAQUE IOHEXOL 300 MG/ML  SOLN COMPARISON:  Ultrasound abdomen 03/06/2024. CT abdomen and pelvis 01/25/2024 and 04/28/2020 FINDINGS: Lower chest: Lung bases are clear. Hepatobiliary: Gallbladder is not visualized, either surgically absent or contracted. No focal liver lesions. No bile duct dilatation. Pancreas: Unremarkable. No pancreatic ductal dilatation or surrounding inflammatory changes. Spleen: Normal in size without focal abnormality. Adrenals/Urinary Tract: Adrenal glands are unremarkable. Kidneys are normal, without renal calculi, focal lesion, or hydronephrosis. Bladder is unremarkable. Stomach/Bowel: Stomach, small bowel, and colon are not abnormally distended. No wall thickening or inflammatory changes. Appendix is normal. Vascular/Lymphatic: No significant vascular findings are present. No enlarged abdominal or pelvic lymph nodes. Reproductive: Uterus is surgically absent. Right ovarian cyst measuring 4.4 cm maximal diameter. Left ovarian cyst measuring 2.9 cm maximal diameter. Other: No free air or free fluid in the abdomen. Abdominal wall musculature appears intact. Musculoskeletal: No acute or significant osseous findings. IMPRESSION: 1. No acute process demonstrated in the abdomen or pelvis. No evidence of bowel obstruction or inflammation. 2. Bilateral ovarian cysts. Most significant: Right ovarian simple-appearing cyst measuring 4.4 cm. No follow-up imaging is recommended. Reference: JACR 2020 Feb;17(2):248-254 Electronically  Signed   By: Burman Nieves M.D.   On: 03/15/2024 23:51   DG Chest 2 View Result Date: 03/06/2024 CLINICAL DATA:  Right upper quadrant pain. Epigastric pain. Decreased p.o. intake,  dehydration, nausea EXAM: CHEST - 2 VIEW COMPARISON:  02/10/2021 and CT 02/25/2024 FINDINGS: Stable cardiomediastinal silhouette. No focal consolidation, pleural effusion, or pneumothorax. No displaced rib fractures. IMPRESSION: No active cardiopulmonary disease. Electronically Signed   By: Minerva Fester M.D.   On: 03/06/2024 23:49   US Abdomen Limited RUQ (LIVER/GB) Result Date: 03/06/2024 CLINICAL DATA:  Upper quadrant pain EXAM: ULTRASOUND ABDOMEN LIMITED RIGHT UPPER QUADRANT COMPARISON:  CT 01/25/2024 FINDINGS: Gallbladder: Prior cholecystectomy Common bile duct: Diameter: Normal caliber, 3 mm Liver: No focal lesion identified. Within normal limits in parenchymal echogenicity. Portal vein is patent on color Doppler imaging with normal direction of blood flow towards the liver. Other: None. IMPRESSION: No acute findings. Electronically Signed   By: Charlett Nose M.D.   On: 03/06/2024 23:44   CT Angio Chest PE W and/or Wo Contrast Result Date: 02/25/2024 CLINICAL DATA:  Chest pain, PE suspected. History of autoimmune disease. EXAM: CT ANGIOGRAPHY CHEST WITH CONTRAST TECHNIQUE: Multidetector CT imaging of the chest was performed using the standard protocol during bolus administration of intravenous contrast. Multiplanar CT image reconstructions and MIPs were obtained to evaluate the vascular anatomy. RADIATION DOSE REDUCTION: This exam was performed according to the departmental dose-optimization program which includes automated exposure control, adjustment of the mA and/or kV according to patient size and/or use of iterative reconstruction technique. CONTRAST:  75mL OMNIPAQUE IOHEXOL 350 MG/ML SOLN COMPARISON:  CTA chest 01/02/2021 FINDINGS: Cardiovascular: Negative for acute pulmonary embolism. Normal caliber thoracic aorta.  No pericardial effusion. Mediastinum/Nodes: Trachea and esophagus are unremarkable. No thoracic adenopathy Lungs/Pleura: No focal consolidation, pleural effusion, or pneumothorax. Upper Abdomen: No acute abnormality. Musculoskeletal: No acute fracture. Review of the MIP images confirms the above findings. IMPRESSION: Negative for acute pulmonary embolism. No acute abnormality in the chest. Electronically Signed   By: Minerva Fester M.D.   On: 02/25/2024 23:49   CT ABDOMEN PELVIS W CONTRAST Result Date: 01/25/2024 CLINICAL DATA:  Right upper quadrant pain recent cholecystectomy EXAM: CT ABDOMEN AND PELVIS WITH CONTRAST TECHNIQUE: Multidetector CT imaging of the abdomen and pelvis was performed using the standard protocol following bolus administration of intravenous contrast. RADIATION DOSE REDUCTION: This exam was performed according to the departmental dose-optimization program which includes automated exposure control, adjustment of the mA and/or kV according to patient size and/or use of iterative reconstruction technique. CONTRAST:  OMNIPAQUE IOHEXOL 300 MG/ML  SOLN COMPARISON:  Ultrasound 12/29/2023, CT 04/28/2020 FINDINGS: Lower chest: No acute abnormality. Hepatobiliary: Interval cholecystectomy. No fluid collections. Minimal stranding likely due to postoperative change. No biliary dilatation Pancreas: Unremarkable. No pancreatic ductal dilatation or surrounding inflammatory changes. Spleen: Normal in size without focal abnormality. Adrenals/Urinary Tract: Adrenal glands are unremarkable. Kidneys are normal, without renal calculi, focal lesion, or hydronephrosis. Bladder is unremarkable. Stomach/Bowel: Stomach is within normal limits. Appendix appears normal. No evidence of bowel wall thickening, distention, or inflammatory changes. Vascular/Lymphatic: No significant vascular findings are present. No enlarged abdominal or pelvic lymph nodes. Reproductive: Hysterectomy. Possible 2.6 cm left adnexal  cyst, no imaging follow-up is recommended. Other: Negative for pelvic effusion or free air Musculoskeletal: No acute or suspicious osseous abnormality IMPRESSION: Interval cholecystectomy. No fluid collections. Minimal stranding at the gallbladder fossa likely due to postoperative change. Electronically Signed   By: Jasmine Pang M.D.   On: 01/25/2024 19:46   US Abdomen Limited RUQ (LIVER/GB) Result Date: 12/29/2023 CLINICAL DATA:  Ulna upper quadrant pain for 2 days. EXAM: ULTRASOUND ABDOMEN LIMITED RIGHT UPPER QUADRANT COMPARISON:  CT, 04/28/2020. FINDINGS: Gallbladder: Moderately  distended. Positive gallstones, largest noted in the gallbladder neck, non mobile, measuring 1.3 cm. No wall thickening or pericholecystic fluid. Patient reportedly is tender to transducer pressure over the gallbladder. Common bile duct: Diameter: 4 mm Liver: No focal lesion identified. Within normal limits in parenchymal echogenicity. Portal vein is patent on color Doppler imaging with normal direction of blood flow towards the liver. Other: None. IMPRESSION: 1. Gallstones, without convincing evidence of acute cholecystitis. 2. No other abnormalities. Electronically Signed   By: Amie Portland M.D.   On: 12/29/2023 09:28     MEDS ordered this encounter: Meds ordered this encounter  Medications   megestrol (MEGACE) 40 MG tablet    Sig: Take 1 tablet (40 mg total) by mouth daily.    Dispense:  30 tablet    Refill:  0    Orders for this encounter: Orders Placed This Encounter  Procedures   Ovarian Malignancy Risk-ROMA   T4, free   Premenopausal Interp: LOW   Postmenopausal Interp: LOW   On exam her right ovary is adherent to her right pelvic sidewall, became symptomatic probably with the cyst of 4.5 cm, tender on exam With her complicated current medical issues and her FH of ovarian cancer in her mom, pt wants BSO performed for management  Also long history of draggin her labia in during intercourse casuing  irritation and pain, will also do labioplasty simple   Impression + Management Plan   ICD-10-CM   1. Cyst of right ovary: adherent to the right pelvic sidewall  N83.201 Ovarian Malignancy Risk-ROMA    2. History of Hashimoto thyroiditis  Z86.39 T4, free    3. Labial hypertrophy  N90.60       Follow Up: Return in about 22 days (around 04/09/2024) for Post Op, with Dr Despina Hidden.     All questions were answered.  Past Medical History:  Diagnosis Date   Allergic rhinitis    Anemia    Arthritis    Asthma    last used inhaler 1 month ago   Complication of anesthesia    if given fentanyl has severe itching, rash and wheezing   Cubital tunnel syndrome on left 07/2023   Depression    Dysphagia    Dysrhythmia    SVT   Ehlers-Danlos syndrome    Female bladder prolapse    Gastritis    GERD (gastroesophageal reflux disease)    with pregnancy only   Hashimoto's thyroiditis    Hypothyroidism    Lupus    Mast cell activation syndrome (HCC)    Migraines    Osteoarthritis    Pelvic pain in antepartum period in second trimester 01/17/2014   Pneumonia    POTS (postural orthostatic tachycardia syndrome)    Pregnant 08/01/2015   Psoriatic arthritis (HCC)    PSVT (paroxysmal supraventricular tachycardia) (HCC)    could not be confirmed with 2016 study   Rectocele    Sjogren's syndrome (HCC)    Thoracic outlet syndrome    Thyroid disease    Urethral prolapse    Uterine prolapse    Vaginal Pap smear, abnormal     Past Surgical History:  Procedure Laterality Date   ANKLE SURGERY Bilateral    2023 L ankle debridement and internal brace 2024 R ankle debridement and internal brace   ANTERIOR AND POSTERIOR VAGINAL REPAIR     ANTERIOR INTEROSSEOUS NERVE DECOMPRESSION Left 08/13/2023   Procedure: SUBCUTANEOUS ANTERIOR TRANSPOSITION OF ULNAR NERVE AT LEFT ELBOW;  Surgeon: Christena Flake, MD;  Location: [facility] ORS;  Service: Orthopedics;  Laterality: Left;   BREAST REDUCTION SURGERY Bilateral  08/20/2022   Procedure: MAMMARY REDUCTION  (BREAST) with Liposuction;  Surgeon: Janne Napoleon, MD;  Location: [facility] OR;  Service: Plastics;  Laterality: Bilateral;  3 hours   COLONOSCOPY WITH ESOPHAGOGASTRODUODENOSCOPY (EGD)  12/26/2022   CYSTOCELE REPAIR N/A 09/17/2017   Procedure: ANTERIOR REPAIR (CYSTOCELE);  Surgeon: Lazaro Arms, MD;  Location: [facility];  Service: Gynecology;  Laterality: N/A;   KNEE SURGERY Left 11/2021   KNEE SURGERY Right 2023   LIPOSUCTION  03/2022   PANNICULECTOMY N/A 04/28/2023   Procedure: PANNICULECTOMY;  Surgeon: Santiago Glad, MD;  Location: [facility] OR;  Service: Plastics;  Laterality: N/A;   SUPRAVENTRICULAR TACHYCARDIA ABLATION  01/05/2015   unsuccessful   SUPRAVENTRICULAR TACHYCARDIA ABLATION N/A 01/05/2015   Procedure: SUPRAVENTRICULAR TACHYCARDIA ABLATION;  Surgeon: Marinus Maw, MD;  Location: [facility] CATH LAB;  Service: Cardiovascular;  Laterality: N/A;   Thoracic outlet decompression  2024   VAGINAL HYSTERECTOMY N/A 09/17/2017   Procedure: HYSTERECTOMY VAGINAL WITH POSSIBLE BILATERAL SALPINGECTOMY;  Surgeon: Lazaro Arms, MD;  Location: [facility];  Service: Gynecology;  Laterality: N/A;   WRIST GANGLION EXCISION Left    x2    OB History     Gravida  6   Para  6   Term  6   Preterm      AB      Living  6      SAB      IAB      Ectopic      Multiple  0   Live Births  6           Allergies  Allergen Reactions   Apremilast Rash and Other (See Comments)    Mood changes Hallucination DMAR  Pt stated this med makes her depression worse, makes her angry.   Cosentyx [Secukinumab] Anaphylaxis   Fentanyl Itching, Other (See Comments) and Rash    wheezing   Hydroxychloroquine Palpitations and Other (See Comments)    Prolonged QT irregular heart rate   Metoclopramide Other (See Comments)    DO NOT GIVE VIA IV (can take PO) Causes Tremors, Jerking, High Heart Rate   Other Rash    Lidocaine Patches Adhesive Burn Scar Skin     Prochlorperazine Other (See Comments)    DO NOT GIVE VIA IV  (can take PO) Causes Tremors, Jerking, High Heart Rate   Tape Swelling and Rash    Use paper tape Angioedema, hives    Tegaderm Ag Mesh [Silver] Hives and Rash   Latex Dermatitis   Zetia [Ezetimibe] Nausea And Vomiting   Zofran [Ondansetron Hcl]     Prolonged QT    Social History   Socioeconomic History   Marital status: Married    Spouse name: Reuel Boom   Number of children: 6   Years of education: HS   Highest education level: Not on file  Occupational History   Occupation: Web designer  Tobacco Use   Smoking status: Never    Passive exposure: Never   Smokeless tobacco: Never  Vaping Use   Vaping status: Never Used  Substance and Sexual Activity   Alcohol use: No   Drug use: No   Sexual activity: Yes    Birth control/protection: Surgical    Comment: hyst  Other Topics Concern   Not on file  Social History Narrative   Lives with husband   Right handed    Caffeine use: 1-2  diet cokes per day   Social Drivers of Health   Financial Resource Strain: Low Risk  (05/14/2023)   Received from [facility], [facility]   Overall Financial Resource Strain (CARDIA)    Difficulty of Paying Living Expenses: Not hard at all  Food Insecurity: No Food Insecurity (05/14/2023)   Received from [facility], [facility]   Hunger Vital Sign    Worried About Running Out of Food in the Last Year: Never true    Ran Out of Food in the Last Year: Never true  Transportation Needs: No Transportation Needs (05/14/2023)   Received from [facility], [facility]   [facility] - Transportation    Lack of Transportation (Medical): No    Lack of Transportation (Non-Medical): No  Physical Activity: Inactive (09/11/2021)   Received from [facility], [facility]   Exercise Vital Sign    Days of Exercise per Week: 0 days    Minutes of Exercise per Session: 0 min  Stress: Stress Concern Present  (09/11/2021)   Received from [facility] of Occupational [facility] - Occupational Stress Questionnaire  Social Connections: Moderately Isolated (09/11/2021)   Received from [facility]   Social Connection and Isolation Panel [NHANES]    Family History  Problem Relation Age of Onset   Hypertension Mother    Hyperlipidemia Mother    Multiple sclerosis Mother    COPD Mother    Cancer Mother        melanoma x 2   Diabetes Mother    Heart disease Father        PSVT   Hyperlipidemia Brother    Hypertension Brother    Heart disease Maternal Grandfather        heart attack   Cancer Paternal Grandmother        pancreatic cancer   Heart disease Paternal Grandmother        MI   Hyperlipidemia Paternal Grandmother    Hypertension Paternal Grandmother    Asthma Daughter    Asthma Son    Autism Son    ADD / ADHD Son    Tourette syndrome Son    Other Son        Neurological and mental issues   ADD / ADHD Son

## 2024-03-19 LAB — OVARIAN MALIGNANCY RISK-ROMA
Cancer Antigen (CA) 125: 12.1 U/mL (ref 0.0–38.1)
HE4: 44.9 pmol/L (ref 0.0–61.2)
Postmenopausal ROMA: 0.9
Premenopausal ROMA: 0.58

## 2024-03-19 LAB — PREMENOPAUSAL INTERP: LOW

## 2024-03-19 LAB — POSTMENOPAUSAL INTERP: LOW

## 2024-03-19 LAB — T4, FREE: Free T4: 1.59 ng/dL (ref 0.82–1.77)

## 2024-03-22 ENCOUNTER — Other Ambulatory Visit: Payer: Self-pay | Admitting: Obstetrics & Gynecology

## 2024-03-22 ENCOUNTER — Ambulatory Visit: Admitting: Obstetrics & Gynecology

## 2024-03-22 ENCOUNTER — Encounter: Payer: Self-pay | Admitting: Obstetrics & Gynecology

## 2024-03-22 DIAGNOSIS — Z01818 Encounter for other preprocedural examination: Secondary | ICD-10-CM

## 2024-03-22 NOTE — Patient Instructions (Signed)
 Sharon Rowland  03/22/2024     @PREFPERIOPPHARMACY @   Your procedure is scheduled on  03/24/2024.   Report to [facility] at  0930  A.M.   Call this number if you have problems the morning of surgery:  [phone]  If you experience any cold or flu symptoms such as cough, fever, chills, shortness of breath, etc. between now and your scheduled surgery, please notify us at the above number.   Remember:        Use your inhaler before you come and bring your rescue inhaler with you.   Do not eat after midnight.   You may drink clear liquids until  0730 am on 03/24/2024.     Clear liquids allowed are:                    Water, Juice (No red color; non-citric and without pulp; diabetics please choose diet or no sugar options), Carbonated beverages (diabetics please choose diet or no sugar options), Clear Tea (No creamer, milk, or cream, including half & half and powdered creamer), Black Coffee Only (No creamer, milk or cream, including half & half and powdered creamer), and Clear Sports drink (No red color; diabetics please choose diet or no sugar options)          At 0730 am on 03/24/2024 drink your carb drink. You can have nothing else after this.    Take these medicines the morning of surgery with A SIP OF WATER        atogepant, bupropion,levothyroxine, omeprazole, pregabalin.     Do not wear jewelry, make-up or nail polish, including gel polish,  artificial nails, or any other type of covering on natural nails (fingers and  toes).  Do not wear lotions, powders, or perfumes, or deodorant.  Do not shave 48 hours prior to surgery.  Men may shave face and neck.  Do not bring valuables to the hospital.  [facility] is not responsible for any belongings or valuables.  Contacts, dentures or bridgework may not be worn into surgery.  Leave your suitcase in the car.  After surgery it may be brought to your room.  For patients admitted to the hospital, discharge time will  be determined by your treatment team.  Patients discharged the day of surgery will not be allowed to drive home and must have someone with them for 24 hours.    Special instructions:   DO NOT smoke tobacco or vape for 24 hours before your procedure.  Please read over the following fact sheets that you were given. Pain Booklet, Coughing and Deep Breathing, Blood Transfusion Information, Surgical Site Infection Prevention, Anesthesia Post-op Instructions, and Care and Recovery After Surgery      Bilateral Salpingo-Oophorectomy, Care After The following information offers guidance on how to care for yourself after your procedure. Your health care provider may also give you more specific instructions. If you have problems or questions, contact your health care provider. What can I expect after the procedure? After the procedure, it is common to have: Pain in your abdomen. Occasional vaginal bleeding (spotting). Tiredness. Symptoms of menopause, such as hot flashes, night sweats, and mood swings. Your recovery time will depend on which method was used for your surgery. Follow these instructions at home: Medicines Take over-the-counter and prescription medicines only as told by your health care provider. Ask your health care provider if the medicine prescribed to you: Requires you to  avoid driving or using machinery. Can cause constipation. You may need to take these actions to prevent or treat constipation: Drink enough fluid to keep your urine pale yellow. Take over-the-counter or prescription medicines. Eat foods that are high in fiber, such as beans, whole grains, and fresh fruits and vegetables. Limit foods that are high in fat and processed sugars, such as fried or sweet foods. Incision care  Follow instructions from your health care provider about how to take care of your incision or incisions. Make sure you: Wash your hands with soap and water for at least 20 seconds before and  after you change your bandage (dressing). If soap and water are not available, use hand sanitizer. Change your dressing as told by your health care provider. Keep your incision area and your dressing clean and dry. Leave stitches (sutures), staples, skin glue, or adhesive strips in place. These skin closures may need to stay in place for 2 weeks or longer. If adhesive strip edges start to loosen and curl up, you may trim the loose edges. Do not remove adhesive strips completely unless your health care provider tells you to do that. Check your incision area every day for signs of infection. Check for: Redness, swelling, or pain. Fluid or blood. Warmth. Pus or a bad smell. Activity  Rest as told by your health care provider. Avoid sitting for a long time without moving. Get up to take short walks every 1-2 hours. This is important to improve blood flow and breathing. Ask for help if you feel weak or unsteady. Return to your normal activities as told by your health care provider. Ask your health care provider what activities are safe for you. Do not drive until your health care provider says that it is safe. Do not lift anything that is heavier than 10 lb (4.5 kg), or the limit that you are told, until your health care provider says that it is safe. Do not do any activity that requires great effort. Do not douche, use tampons, or have sex until your health care provider approves. General instructions  Do not use any products that contain nicotine or tobacco. These products include cigarettes, chewing tobacco, and vaping devices, such as e-cigarettes. These can delay incision healing after surgery. If you need help quitting, ask your health care provider. Do not take baths, swim, or use a hot tub until your health care provider approves. Ask your health care provider if you may take showers. You may only be allowed to take sponge baths. Wear compression stockings as told by your health care  provider. These stockings help to prevent blood clots and reduce swelling in your legs. Keep all follow-up visits. This is important. Contact a health care provider if: You have pain when you urinate. You have pus, a bad smell, or a bad-smelling discharge coming from your vagina or from an incision. You have redness, swelling, or pain around an incision or an incision feels warm to the touch. You have fluid or blood coming from an incision or an incision starts to open. You have a fever or a rash. You have abdominal pain that gets worse or does not get better with medicine. You feel light-headed, have nausea and vomiting, or both. Get help right away if: You have pain in your chest or leg. You develop shortness of breath. You faint. You have increased or heavy vaginal bleeding, such as soaking a sanitary napkin in an hour. These symptoms may represent a serious problem that  is an emergency. Do not wait to see if the symptoms will go away. Get medical help right away. Call your local emergency services (911 in the [place] Do not drive yourself to the hospital. Summary After the procedure, it is common to have pain, bleeding in the vagina, tiredness, and symptoms of menopause. Follow instructions from your health care provider about how to take care of your incision or incisions. Follow instructions from your health care provider about activities and restrictions. Check your incision area every day for signs of infection. Report any signs to your health care provider. This information is not intended to replace advice given to you by your health care provider. Make sure you discuss any questions you have with your health care provider. Document Revised: 10/31/2020 Document Reviewed: 10/31/2020 Elsevier Patient Education  2024 [employer]General Anesthesia, Adult, Care After The following information offers guidance on how to care for yourself after your procedure. Your health care provider  may also give you more specific instructions. If you have problems or questions, contact your health care provider. What can I expect after the procedure? After the procedure, it is common for people to: Have pain or discomfort at the IV site. Have nausea or vomiting. Have a sore throat or hoarseness. Have trouble concentrating. Feel cold or chills. Feel weak, sleepy, or tired (fatigue). Have soreness and body aches. These can affect parts of the body that were not involved in surgery. Follow these instructions at home: For the time period you were told by your health care provider:  Rest. Do not participate in activities where you could fall or become injured. Do not drive or use machinery. Do not drink alcohol. Do not take sleeping pills or medicines that cause drowsiness. Do not make important decisions or sign legal documents. Do not take care of children on your own. General instructions Drink enough fluid to keep your urine pale yellow. If you have sleep apnea, surgery and certain medicines can increase your risk for breathing problems. Follow instructions from your health care provider about wearing your sleep device: Anytime you are sleeping, including during daytime naps. While taking prescription pain medicines, sleeping medicines, or medicines that make you drowsy. Return to your normal activities as told by your health care provider. Ask your health care provider what activities are safe for you. Take over-the-counter and prescription medicines only as told by your health care provider. Do not use any products that contain nicotine or tobacco. These products include cigarettes, chewing tobacco, and vaping devices, such as e-cigarettes. These can delay incision healing after surgery. If you need help quitting, ask your health care provider. Contact a health care provider if: You have nausea or vomiting that does not get better with medicine. You vomit every time you eat or  drink. You have pain that does not get better with medicine. You cannot urinate or have bloody urine. You develop a skin rash. You have a fever. Get help right away if: You have trouble breathing. You have chest pain. You vomit blood. These symptoms may be an emergency. Get help right away. Call 911. Do not wait to see if the symptoms will go away. Do not drive yourself to the hospital. Summary After the procedure, it is common to have a sore throat, hoarseness, nausea, vomiting, or to feel weak, sleepy, or fatigue. For the time period you were told by your health care provider, do not drive or use machinery. Get help right away if you have difficulty breathing,  have chest pain, or vomit blood. These symptoms may be an emergency. This information is not intended to replace advice given to you by your health care provider. Make sure you discuss any questions you have with your health care provider. Document Revised: 03/08/2022 Document Reviewed: 03/08/2022 Elsevier Patient Education  2024 [employer].How to Use Chlorhexidine at Home in the Shower Chlorhexidine gluconate (CHG) is a germ-killing (antiseptic) wash that's used to clean the skin. It can get rid of the germs that normally live on the skin and can keep them away for about 24 hours. If you're having surgery, you may be told to shower with CHG at home the night before surgery. This can help lower your risk for infection. To use CHG wash in the shower, follow the steps below. Supplies needed: CHG body wash. Clean washcloth. Clean towel. How to use CHG in the shower Follow these steps unless you're told to use CHG in a different way: Start the shower. Use your normal soap and shampoo to wash your face and hair. Turn off the shower or move out of the shower stream. Pour CHG onto a clean washcloth. Do not use any type of brush or rough sponge. Start at your neck, washing your body down to your toes. Make sure you: Wash the part  of your body where the surgery will be done for at least 1 minute. Do not scrub. Do not use CHG on your head or face unless your health care provider tells you to. If it gets into your ears or eyes, rinse them well with water. Do not wash your genitals with CHG. Wash your back and under your arms. Make sure to wash skin folds. Let the CHG sit on your skin for 1-2 minutes or as long as told. Rinse your entire body in the shower, including all body creases and folds. Turn off the shower. Dry off with a clean towel. Do not put anything on your skin afterward, such as powder, lotion, or perfume. Put on clean clothes or pajamas. If it's the night before surgery, sleep in clean sheets. General tips Use CHG only as told, and follow the instructions on the label. Use the full amount of CHG as told. This is often one bottle. Do not smoke and stay away from flames after using CHG. Your skin may feel sticky after using CHG. This is normal. The sticky feeling will go away as the CHG dries. Do not use CHG: If you have a chlorhexidine allergy or have reacted to chlorhexidine in the past. On open wounds or areas of skin that have broken skin, cuts, or scrapes. On babies younger than 34 months of age. Contact a health care provider if: You have questions about using CHG. Your skin gets irritated or itchy. You have a rash after using CHG. You swallow any CHG. Call your local poison control center [phone] in the [place] Your eyes itch badly, or they become very red or swollen. Your hearing changes. You have trouble seeing. If you can't reach your provider, go to an urgent care or emergency room. Do not drive yourself. Get help right away if: You have swelling or tingling in your mouth or throat. You make high-pitched whistling sounds when you breathe, most often when you breathe out (wheeze). You have trouble breathing. These symptoms may be an emergency. Call 911 right away. Do not wait to see  if the symptoms will go away. Do not drive yourself to the hospital. This information is not  intended to replace advice given to you by your health care provider. Make sure you discuss any questions you have with your health care provider. Document Revised: 06/24/2023 Document Reviewed: 06/20/2022 Elsevier Patient Education  2024 [employer].

## 2024-03-23 ENCOUNTER — Encounter (HOSPITAL_COMMUNITY)
Admission: RE | Admit: 2024-03-23 | Discharge: 2024-03-23 | Disposition: A | Discharge status: Home / Self Care | Source: Ambulatory Visit | Attending: Obstetrics & Gynecology | Admitting: Obstetrics & Gynecology

## 2024-03-23 DIAGNOSIS — N843 Polyp of vulva: Secondary | ICD-10-CM | POA: Insufficient documentation

## 2024-03-23 DIAGNOSIS — Z8041 Family history of malignant neoplasm of ovary: Secondary | ICD-10-CM | POA: Insufficient documentation

## 2024-03-23 DIAGNOSIS — Z01812 Encounter for preprocedural laboratory examination: Secondary | ICD-10-CM | POA: Insufficient documentation

## 2024-03-23 DIAGNOSIS — N83201 Unspecified ovarian cyst, right side: Secondary | ICD-10-CM | POA: Diagnosis present

## 2024-03-23 DIAGNOSIS — K219 Gastro-esophageal reflux disease without esophagitis: Secondary | ICD-10-CM | POA: Diagnosis not present

## 2024-03-23 DIAGNOSIS — M329 Systemic lupus erythematosus, unspecified: Secondary | ICD-10-CM | POA: Diagnosis not present

## 2024-03-23 DIAGNOSIS — R1031 Right lower quadrant pain: Secondary | ICD-10-CM | POA: Diagnosis not present

## 2024-03-23 DIAGNOSIS — I10 Essential (primary) hypertension: Secondary | ICD-10-CM | POA: Diagnosis not present

## 2024-03-23 DIAGNOSIS — Z01818 Encounter for other preprocedural examination: Secondary | ICD-10-CM

## 2024-03-23 LAB — TYPE AND SCREEN
ABO/RH(D): A NEG
Antibody Screen: NEGATIVE

## 2024-03-23 LAB — CBC
HCT: 34.4 % — ABNORMAL LOW (ref 36.0–46.0)
Hemoglobin: 11.4 g/dL — ABNORMAL LOW (ref 12.0–15.0)
MCH: 27 pg (ref 26.0–34.0)
MCHC: 33.1 g/dL (ref 30.0–36.0)
MCV: 81.5 fL (ref 80.0–100.0)
Platelets: 253 10*3/uL (ref 150–400)
RBC: 4.22 MIL/uL (ref 3.87–5.11)
RDW: 13.9 % (ref 11.5–15.5)
WBC: 7.5 10*3/uL (ref 4.0–10.5)
nRBC: 0 % (ref 0.0–0.2)

## 2024-03-23 LAB — URINALYSIS, ROUTINE W REFLEX MICROSCOPIC
Bilirubin Urine: NEGATIVE
Glucose, UA: NEGATIVE mg/dL
Ketones, ur: NEGATIVE mg/dL
Leukocytes,Ua: NEGATIVE
Nitrite: NEGATIVE
Protein, ur: NEGATIVE mg/dL
Specific Gravity, Urine: 1.002 — ABNORMAL LOW (ref 1.005–1.030)
pH: 7 (ref 5.0–8.0)

## 2024-03-23 LAB — COMPREHENSIVE METABOLIC PANEL WITH GFR
ALT: 11 U/L (ref 0–44)
AST: 9 U/L — ABNORMAL LOW (ref 15–41)
Albumin: 3.6 g/dL (ref 3.5–5.0)
Alkaline Phosphatase: 55 U/L (ref 38–126)
Anion gap: 11 (ref 5–15)
BUN: 6 mg/dL (ref 6–20)
CO2: 21 mmol/L — ABNORMAL LOW (ref 22–32)
Calcium: 8.6 mg/dL — ABNORMAL LOW (ref 8.9–10.3)
Chloride: 106 mmol/L (ref 98–111)
Creatinine, Ser: 0.63 mg/dL (ref 0.44–1.00)
GFR, Estimated: >60 mL/min (ref >60)
Glucose, Bld: 105 mg/dL — ABNORMAL HIGH (ref 70–99)
Potassium: 3.2 mmol/L — ABNORMAL LOW (ref 3.5–5.1)
Sodium: 138 mmol/L (ref 135–145)
Total Bilirubin: 0.2 mg/dL (ref 0.0–1.2)
Total Protein: 6.4 g/dL — ABNORMAL LOW (ref 6.5–8.1)

## 2024-03-23 LAB — RAPID HIV SCREEN (HIV 1/2 AB+AG)
HIV 1/2 Antibodies: NONREACTIVE
HIV-1 P24 Antigen - HIV24: NONREACTIVE

## 2024-03-24 ENCOUNTER — Encounter (HOSPITAL_COMMUNITY)
Admission: RE | Disposition: A | Discharge status: Home / Self Care | Payer: Self-pay | Source: Ambulatory Visit | Attending: Obstetrics & Gynecology

## 2024-03-24 ENCOUNTER — Ambulatory Visit (HOSPITAL_COMMUNITY)
Admission: RE | Admit: 2024-03-24 | Discharge: 2024-03-24 | Disposition: A | Discharge status: Home / Self Care | Source: Ambulatory Visit | Attending: Obstetrics & Gynecology | Admitting: Obstetrics & Gynecology

## 2024-03-24 ENCOUNTER — Ambulatory Visit (HOSPITAL_COMMUNITY): Admitting: Anesthesiology

## 2024-03-24 DIAGNOSIS — N83201 Unspecified ovarian cyst, right side: Secondary | ICD-10-CM

## 2024-03-24 DIAGNOSIS — N941 Unspecified dyspareunia: Secondary | ICD-10-CM

## 2024-03-24 DIAGNOSIS — M329 Systemic lupus erythematosus, unspecified: Secondary | ICD-10-CM | POA: Insufficient documentation

## 2024-03-24 DIAGNOSIS — Z01812 Encounter for preprocedural laboratory examination: Secondary | ICD-10-CM | POA: Diagnosis not present

## 2024-03-24 DIAGNOSIS — K219 Gastro-esophageal reflux disease without esophagitis: Secondary | ICD-10-CM | POA: Insufficient documentation

## 2024-03-24 DIAGNOSIS — I10 Essential (primary) hypertension: Secondary | ICD-10-CM | POA: Insufficient documentation

## 2024-03-24 DIAGNOSIS — N843 Polyp of vulva: Secondary | ICD-10-CM | POA: Insufficient documentation

## 2024-03-24 DIAGNOSIS — R102 Pelvic and perineal pain: Secondary | ICD-10-CM

## 2024-03-24 DIAGNOSIS — N906 Unspecified hypertrophy of vulva: Secondary | ICD-10-CM | POA: Diagnosis not present

## 2024-03-24 DIAGNOSIS — Z8041 Family history of malignant neoplasm of ovary: Secondary | ICD-10-CM | POA: Insufficient documentation

## 2024-03-24 DIAGNOSIS — R1031 Right lower quadrant pain: Secondary | ICD-10-CM | POA: Insufficient documentation

## 2024-03-24 HISTORY — PX: SALPINGO-OOPHORECTOMY, BILATERAL, ROBOT ASSISTED, LAPAROSCOPIC: SHX7402

## 2024-03-24 HISTORY — PX: LABIOPLASTY: SHX1900

## 2024-03-24 SURGERY — SALPINGO-OOPHORECTOMY, BILATERAL, ROBOT ASSISTED, LAPAROSCOPIC, D5
Anesthesia: General | Site: Vagina

## 2024-03-24 MED ORDER — KETOROLAC TROMETHAMINE 10 MG PO TABS: 10.0000 mg | ORAL_TABLET | Freq: Three times a day (TID) | ORAL | 0 refills | Status: DC | PRN

## 2024-03-24 MED ORDER — DIPHENHYDRAMINE HCL 50 MG/ML IJ SOLN: 50.0000 mg | Freq: Four times a day (QID) | INTRAMUSCULAR | Status: DC | PRN

## 2024-03-24 MED ORDER — ROCURONIUM BROMIDE 10 MG/ML (PF) SYRINGE
PREFILLED_SYRINGE | INTRAVENOUS | Status: DC | PRN
  Administered 2024-03-24: 70 mg via INTRAVENOUS

## 2024-03-24 MED ORDER — DEXAMETHASONE SODIUM PHOSPHATE 10 MG/ML IJ SOLN
INTRAMUSCULAR | Status: AC
  Filled 2024-03-24: qty 1

## 2024-03-24 MED ORDER — ACETAMINOPHEN 500 MG PO TABS
1000.0000 mg | ORAL_TABLET | Freq: Once | ORAL | Status: AC
  Administered 2024-03-24: 1000 mg via ORAL

## 2024-03-24 MED ORDER — PROPOFOL 10 MG/ML IV BOLUS
INTRAVENOUS | Status: DC | PRN
  Administered 2024-03-24: 170 mg via INTRAVENOUS

## 2024-03-24 MED ORDER — PROPOFOL 500 MG/50ML IV EMUL
INTRAVENOUS | Status: AC
  Filled 2024-03-24: qty 50

## 2024-03-24 MED ORDER — BUPIVACAINE HCL (PF) 0.25 % IJ SOLN
INTRAMUSCULAR | Status: AC
  Filled 2024-03-24: qty 60

## 2024-03-24 MED ORDER — ROCURONIUM BROMIDE 10 MG/ML (PF) SYRINGE
PREFILLED_SYRINGE | INTRAVENOUS | Status: AC
  Filled 2024-03-24: qty 20

## 2024-03-24 MED ORDER — SUGAMMADEX SODIUM 200 MG/2ML IV SOLN
INTRAVENOUS | Status: DC | PRN
  Administered 2024-03-24: 200 mg via INTRAVENOUS

## 2024-03-24 MED ORDER — DIPHENHYDRAMINE HCL 50 MG/ML IJ SOLN
INTRAMUSCULAR | Status: AC
Start: 2024-03-24 — End: ?
  Filled 2024-03-24: qty 1

## 2024-03-24 MED ORDER — DIPHENHYDRAMINE HCL 50 MG/ML IJ SOLN
INTRAMUSCULAR | Status: DC | PRN
Start: 2024-03-24 — End: 2024-03-24
  Administered 2024-03-24: 50 mg via INTRAVENOUS

## 2024-03-24 MED ORDER — FAMOTIDINE 20 MG PO TABS: 20.0000 mg | ORAL_TABLET | Freq: Once | ORAL | Status: AC

## 2024-03-24 MED ORDER — LIDOCAINE HCL (PF) 2 % IJ SOLN
INTRAMUSCULAR | Status: AC
  Filled 2024-03-24: qty 5

## 2024-03-24 MED ORDER — ORAL CARE MOUTH RINSE: 15.0000 mL | Freq: Once | OROMUCOSAL | Status: AC

## 2024-03-24 MED ORDER — KETOROLAC TROMETHAMINE 30 MG/ML IJ SOLN
30.0000 mg | INTRAMUSCULAR | Status: AC
  Administered 2024-03-24: 30 mg via INTRAVENOUS
  Filled 2024-03-24: qty 1

## 2024-03-24 MED ORDER — OXYCODONE HCL 5 MG PO TABS: 5.0000 mg | ORAL_TABLET | Freq: Once | ORAL | Status: DC | PRN

## 2024-03-24 MED ORDER — SUGAMMADEX SODIUM 200 MG/2ML IV SOLN
INTRAVENOUS | Status: AC
  Filled 2024-03-24: qty 2

## 2024-03-24 MED ORDER — FAMOTIDINE IN NACL 20-0.9 MG/50ML-% IV SOLN: 20.0000 mg | Freq: Once | INTRAVENOUS | Status: DC

## 2024-03-24 MED ORDER — PROPOFOL 500 MG/50ML IV EMUL
INTRAVENOUS | Status: DC | PRN
  Administered 2024-03-24 (×2): 150 ug/kg/min via INTRAVENOUS

## 2024-03-24 MED ORDER — SILVER SULFADIAZINE 1 % EX CREA
TOPICAL_CREAM | CUTANEOUS | Status: DC | PRN
  Administered 2024-03-24: 1 via TOPICAL

## 2024-03-24 MED ORDER — MIDAZOLAM HCL 2 MG/2ML IJ SOLN
INTRAMUSCULAR | Status: AC
  Filled 2024-03-24: qty 2

## 2024-03-24 MED ORDER — LACTATED RINGERS IV SOLN: INTRAVENOUS | Status: DC

## 2024-03-24 MED ORDER — ONDANSETRON HCL 4 MG/2ML IJ SOLN
INTRAMUSCULAR | Status: AC
  Filled 2024-03-24: qty 2

## 2024-03-24 MED ORDER — SILVER SULFADIAZINE 1 % EX CREA
TOPICAL_CREAM | CUTANEOUS | Status: AC
  Filled 2024-03-24: qty 50

## 2024-03-24 MED ORDER — MIDAZOLAM HCL 2 MG/2ML IJ SOLN
INTRAMUSCULAR | Status: DC | PRN
  Administered 2024-03-24: 2 mg via INTRAVENOUS

## 2024-03-24 MED ORDER — DIPHENHYDRAMINE HCL 50 MG/ML IJ SOLN
INTRAMUSCULAR | Status: AC
  Filled 2024-03-24: qty 1

## 2024-03-24 MED ORDER — ACETAMINOPHEN 500 MG PO TABS
ORAL_TABLET | ORAL | Status: AC
  Filled 2024-03-24: qty 2

## 2024-03-24 MED ORDER — ACETAMINOPHEN 160 MG/5ML PO SOLN
960.0000 mg | Freq: Once | ORAL | Status: AC
  Filled 2024-03-24: qty 30

## 2024-03-24 MED ORDER — PROMETHAZINE HCL 25 MG PO TABS: 25.0000 mg | ORAL_TABLET | Freq: Four times a day (QID) | ORAL | 0 refills | Status: DC | PRN

## 2024-03-24 MED ORDER — HYDROMORPHONE HCL 1 MG/ML IJ SOLN
0.2500 mg | INTRAMUSCULAR | Status: DC | PRN
  Administered 2024-03-24: 0.5 mg via INTRAVENOUS
  Filled 2024-03-24: qty 0.5

## 2024-03-24 MED ORDER — CHLORHEXIDINE GLUCONATE 0.12 % MT SOLN
15.0000 mL | Freq: Once | OROMUCOSAL | Status: AC
  Administered 2024-03-24: 15 mL via OROMUCOSAL

## 2024-03-24 MED ORDER — POVIDONE-IODINE 10 % EX SWAB: 2.0000 | Freq: Once | CUTANEOUS | Status: DC

## 2024-03-24 MED ORDER — HYDROMORPHONE HCL 1 MG/ML IJ SOLN
INTRAMUSCULAR | Status: AC
  Filled 2024-03-24: qty 0.5

## 2024-03-24 MED ORDER — BUPIVACAINE HCL (PF) 0.25 % IJ SOLN
INTRAMUSCULAR | Status: DC | PRN
Start: 2024-03-24 — End: 2024-03-24
  Administered 2024-03-24: 40 mL

## 2024-03-24 MED ORDER — OXYCODONE-ACETAMINOPHEN 7.5-325 MG PO TABS: 1.0000 | ORAL_TABLET | Freq: Four times a day (QID) | ORAL | 0 refills | Status: DC | PRN

## 2024-03-24 MED ORDER — DEXAMETHASONE SODIUM PHOSPHATE 10 MG/ML IJ SOLN
INTRAMUSCULAR | Status: DC | PRN
  Administered 2024-03-24: 10 mg via INTRAVENOUS

## 2024-03-24 MED ORDER — HYDROMORPHONE HCL 1 MG/ML IJ SOLN
INTRAMUSCULAR | Status: DC | PRN
  Administered 2024-03-24 (×3): .5 mg via INTRAVENOUS

## 2024-03-24 MED ORDER — FAMOTIDINE 20 MG PO TABS
ORAL_TABLET | ORAL | Status: AC
  Administered 2024-03-24: 20 mg via ORAL
  Filled 2024-03-24: qty 1

## 2024-03-24 MED ORDER — CEFAZOLIN SODIUM-DEXTROSE 2-4 GM/100ML-% IV SOLN
2.0000 g | INTRAVENOUS | Status: AC
  Administered 2024-03-24: 2 g via INTRAVENOUS
  Filled 2024-03-24: qty 100

## 2024-03-24 MED ORDER — OXYCODONE HCL 5 MG/5ML PO SOLN: 5.0000 mg | Freq: Once | ORAL | Status: DC | PRN

## 2024-03-24 MED ORDER — STERILE WATER FOR IRRIGATION IR SOLN
Status: DC | PRN
  Administered 2024-03-24: 500 mL

## 2024-03-24 MED ORDER — LIDOCAINE HCL (PF) 2 % IJ SOLN
INTRAMUSCULAR | Status: DC | PRN
Start: 2024-03-24 — End: 2024-03-24
  Administered 2024-03-24: 80 mg via INTRADERMAL

## 2024-03-24 SURGICAL SUPPLY — 46 items
APPLICATOR COTTON TIP 6 STRL (MISCELLANEOUS) ×2 IMPLANT
APPLICATOR COTTON TIP 6IN STRL (MISCELLANEOUS) ×2 IMPLANT
CANNULA REDUCER 12-8 DVNC XI (CANNULA) ×2 IMPLANT
CAUTERY HOOK MNPLR 1.6 DVNC XI (INSTRUMENTS) ×2 IMPLANT
COVER LIGHT HANDLE STERIS (MISCELLANEOUS) ×4 IMPLANT
COVER MAYO STAND XLG (MISCELLANEOUS) ×2 IMPLANT
DERMABOND ADVANCED .7 DNX12 (GAUZE/BANDAGES/DRESSINGS) ×2 IMPLANT
DRAPE ARM DVNC X/XI (DISPOSABLE) ×6 IMPLANT
DRAPE COLUMN DVNC XI (DISPOSABLE) ×2 IMPLANT
ELECT PENCIL ROCKER SW 15FT (MISCELLANEOUS) ×2 IMPLANT
ELECT REM PT RETURN 9FT ADLT (ELECTROSURGICAL) ×2 IMPLANT
ELECTRODE REM PT RTRN 9FT ADLT (ELECTROSURGICAL) ×2 IMPLANT
FORCEPS PROGRASP DVNC XI (FORCEP) IMPLANT
GAUZE SPONGE 4X4 12PLY STRL (GAUZE/BANDAGES/DRESSINGS) IMPLANT
GLOVE BIOGEL PI IND STRL 7.0 (GLOVE) ×6 IMPLANT
GLOVE BIOGEL PI IND STRL 8 (GLOVE) ×4 IMPLANT
GLOVE ECLIPSE 8.0 STRL XLNG CF (GLOVE) ×4 IMPLANT
GOWN STRL REUS W/TWL LRG LVL3 (GOWN DISPOSABLE) ×2 IMPLANT
GOWN STRL REUS W/TWL XL LVL3 (GOWN DISPOSABLE) ×4 IMPLANT
KIT PINK PAD W/HEAD ARE REST (MISCELLANEOUS) ×2 IMPLANT
KIT PINK PAD W/HEAD ARM REST (MISCELLANEOUS) ×2 IMPLANT
KIT TURNOVER KIT A (KITS) ×2 IMPLANT
MANIFOLD NEPTUNE II (INSTRUMENTS) ×2 IMPLANT
NDL HYPO 21X1.5 SAFETY (NEEDLE) ×2 IMPLANT
NDL INSUFFLATION 14GA 120MM (NEEDLE) ×2 IMPLANT
NEEDLE HYPO 21X1.5 SAFETY (NEEDLE) ×2 IMPLANT
NEEDLE INSUFFLATION 14GA 120MM (NEEDLE) ×2 IMPLANT
OBTURATOR OPTICAL STND 8 DVNC (TROCAR) ×2 IMPLANT
OBTURATOR OPTICALSTD 8 DVNC (TROCAR) ×2 IMPLANT
PACK LAP CHOLE LZT030E (CUSTOM PROCEDURE TRAY) ×2 IMPLANT
POSITIONER HEAD 8X9X4 ADT (SOFTGOODS) ×2 IMPLANT
SEAL UNIV 5-12 XI (MISCELLANEOUS) ×6 IMPLANT
SEALER VESSEL EXT DVNC XI (MISCELLANEOUS) ×2 IMPLANT
SET BASIN LINEN APH (SET/KITS/TRAYS/PACK) ×2 IMPLANT
SET TUBE DA VINCI INSUFFLATOR (TUBING) ×2 IMPLANT
SOL .9 NS 3000ML IRR UROMATIC (IV SOLUTION) ×2 IMPLANT
SUT MNCRL+ AB 3-0 CT1 36 (SUTURE) IMPLANT
SUT MON AB 3-0 SH 27 (SUTURE) IMPLANT
SUT VIC AB 3-0 SH 27X BRD (SUTURE) IMPLANT
SUT VICRYL 0 UR6 27IN ABS (SUTURE) ×2 IMPLANT
SUT VICRYL AB 3-0 FS1 BRD 27IN (SUTURE) ×4 IMPLANT
SYS BAG RETRIEVAL 10MM (BASKET) ×2 IMPLANT
SYSTEM BAG RETRIEVAL 10MM (BASKET) ×2 IMPLANT
TAPE TRANSPORE STRL 2 31045 (GAUZE/BANDAGES/DRESSINGS) ×2 IMPLANT
TRAY FOL W/BAG SLVR 16FR STRL (SET/KITS/TRAYS/PACK) ×2 IMPLANT
WATER STERILE IRR 500ML POUR (IV SOLUTION) ×2 IMPLANT

## 2024-03-24 NOTE — Op Note (Signed)
 Preoperative diagnosis: 4-1/2 cm right ovarian cyst adherent to pelvic sidewall Right lower quadrant pain Strong family history of ovarian cancer  Postoperative diagnosis: Same as above  Procedure: Robot-assisted laparoscopic bilateral salpingo-oophorectomy Placement of a TAP block by me for postoperative pain management  Surgeon: Lazaro Arms, MD  Anesthesia: General Endotracheal  Findings: The peritoneal cavity was essentially normal there was a small cyst present on the ovary which appeared to be benign and simple The ovaries were otherwise normal there may have been some tube adherent but for the most part do believe I remove the tubes when I did her vaginal hysterectomy previously did not really see any tubal tissue there but certainly I removed all the tissue well behind the ovary of the infundibulopelvic ligament dissected off the pelvic sidewall bilaterally  Description of operation: Patient was taken to the operating room placed in the supine position where she underwent general tracheal anesthesia She was prepped and draped in usual sterile fashion for robotic laparoscopy Incision was made just above the umbilicus with an 11 blade The fascia was grasped and a Veress needle was placed into the peritoneal cavity for 1 pass slight difficulty The peritoneal cavity was insufflated Under direct visualization an 8 mm video laparoscope trocars placed into the peritoneal cavity of 1 pass slight difficulty An 8 mm trocar was then placed in the right mid quadrant lateral to the rectus anterior muscle under direct visualization without difficulty A 12 mm trocar was then placed in the left mid quadrant under direct visualization without difficulty The robot was docked A ProGrasp and VSE were used  I then left the patient went to the surgical console I grasped the right ovary with the ProGrasp and took down the infundibulopelvic ligament and the lateral attachment of the right ovary  and tube using the vessel sealer as energy source and achieving hemostasis I carried out the same procedure on the left There was no blood loss Endo Catch bag was placed in the 12 mm trocar site in the left mid quadrant Both ovaries were placed into the bag and were removed with out difficulty intact without any intraperitoneal spillage  A transversus abdominis plane block was then placed by me for postoperative pain management Direct laparoscopic visualization was used a Doyles level technique was employed 10 cc of 0.25% bupivacaine plain was placed at T7 and T10 bilaterally 40 cc total on 100 mg of bupivacaine total  Peritoneal cavity was desufflated The fascia of the left 12 mm incision was closed using 0 Vicryl All 3 incisions were closed using 3-0 Vicryl in a subcuticular fashion Dermabond was placed at all 3 incision sites  There was no blood loss for the procedure The patient received 2 g of Ancef and 30 mg of Toradol preoperatively  Attention was then turned to the labioplasty  The patient was repositioned to reprepped and redraped this procedure  Preoperative diagnosis: Labial hypertrophy causing discomfort with intercourse  Postoperative diagnosis: Same as above  Procedure: Labioplasty bilaterally  Surgeon: Lazaro Arms, MD  Anesthesia: General Endo Tracheal  Finding: Patient had bilateral labial hypertrophy superior portion of her labia was being dragged in during intercourse causing discomfort and also tissue irritation chronically We discussed approach in the office several times she is wanted done for some time but dilute I delayed it and until this point   Description of operation: The patient was in dorsal lithotomy position in candycane Sterets A Foley catheter was placed She was already under general  endotracheal anesthesia I follow the contour of her labia bilaterally and took off the hypertrophic labia bilaterally meeting at the base of the labial  clitoral crura, not disturbing the clitoral crura at all I really just follow the natural contour there was already present in the lower part of the vulva all the way up to the top Hemostasis was achieved with running interlocking 3-0 Monocryl suture A couple of interrupted figure-of-eight's were then placed bilaterally for additional hemostasis Blood loss was about 20 cc Silvadene cream was placed on top Care instructions were reviewed with the patient and her husband  Patient was awakened from anesthesia taken recovery in good stable condition all counts are correct  She had already received her dose of Ancef and Toradol in the stated above  Total of blood loss for both procedures was 20 cc  Lazaro Arms, MD 03/24/2024 12:57 PM

## 2024-03-24 NOTE — Interval H&P Note (Signed)
 History and Physical Interval Note:  03/24/2024 10:34 AM  Sharon Rowland  has presented today for surgery, with the diagnosis of [id number] cm right ovarian cyst.  The various methods of treatment have been discussed with the patient and family. After consideration of risks, benefits and other options for treatment, the patient has consented to  Procedure(s): SALPINGO-OOPHORECTOMY, BILATERAL, ROBOT ASSISTED, LAPAROSCOPIC, D5 (Bilateral) LABIAPLASTY, VULVA (N/A) as a surgical intervention.  The patient's history has been reviewed, patient examined, no change in status, stable for surgery.  I have reviewed the patient's chart and labs.  Questions were answered to the patient's satisfaction.     Lazaro Arms

## 2024-03-24 NOTE — Anesthesia Procedure Notes (Signed)
 Procedure Name: Intubation Date/Time: 03/24/2024 11:04 AM  Performed by: Oletha Cruel, CRNAPre-anesthesia Checklist: Patient identified, Emergency Drugs available, Suction available and Patient being monitored Patient Re-evaluated:Patient Re-evaluated prior to induction Oxygen Delivery Method: Circle system utilized Preoxygenation: Pre-oxygenation with 100% oxygen Induction Type: IV induction Ventilation: Mask ventilation without difficulty Laryngoscope Size: Mac and 4 Grade View: Grade II Tube type: Oral Tube size: 7.0 mm Number of attempts: 1 Airway Equipment and Method: Stylet Placement Confirmation: ETT inserted through vocal cords under direct vision, positive ETCO2, CO2 detector and breath sounds checked- equal and bilateral Secured at: 22 cm Tube secured with: Tape Dental Injury: Teeth and Oropharynx as per pre-operative assessment  Comments: Atraumatic intubation. Lips and teeth remain in preoperative condition.Lips cracked and dry preoperatively. Sterile lubricant ointment placed on lips. Lips and teeth remain in preoperative condition.

## 2024-03-24 NOTE — Transfer of Care (Signed)
 Immediate Anesthesia Transfer of Care Note  Patient: Sharon Rowland  Procedure(s) Performed: SALPINGO-OOPHORECTOMY, BILATERAL, ROBOT ASSISTED, LAPAROSCOPIC, D5 (Bilateral: Abdomen) LABIAPLASTY, VULVA (Vagina )  Patient Location: PACU  Anesthesia Type:General  Level of Consciousness: drowsy and patient cooperative  Airway & Oxygen Therapy: Patient Spontanous Breathing and Patient connected to nasal cannula oxygen  Post-op Assessment: Report given to RN and Post -op Vital signs reviewed and stable  Post vital signs: Reviewed and stable  Last Vitals:  Vitals Value Taken Time  BP 108/79 03/24/24 1256  Temp 97.7 03/24/24   1256  Pulse 105 03/24/24 1259  Resp 11 03/24/24 1259  SpO2 100 % 03/24/24 1259  Vitals shown include unfiled device data.  Last Pain:  Vitals:   03/24/24 0959  TempSrc: Oral  PainSc: 3       Patients Stated Pain Goal: 6 (03/24/24 0959)  Complications: No notable events documented.

## 2024-03-24 NOTE — H&P (Signed)
 Preoperative History and Physical  Sharon Rowland is a 38 y.o. [phone] with Patient's last menstrual period was 10/23/2016 (approximate). admitted for a robot assisted bilateral salpingo oophorectomy.  Pt has a 4.5 cm right ovarian cyst, normal ROMA which is tender, no torsion However probably due to the enlargement it is adherent to the pelvic sidewall causing pain and pressure It is probably normally adherent but being enlarged with a cyst is likely causing peritoneal symptoms also with some nausea She has ahstong family history of ovarian cancer (mother) and she desires both to be removed for that reason She and her husband understand the difficulties of managing surgical menopause in a young woman We will start with the vivelle patch 0.1 mg  Also she has a fairly long history of her vulva being folded in during intercourse which causes discomfort and irritation She already has vagina tissue issues due to her auto immune issues, EDS an medications I will perform a labioplasty to better accommodate vulva during intercourse  PMH:    Past Medical History:  Diagnosis Date   Allergic rhinitis    Anemia    Arthritis    Asthma    last used inhaler 1 month ago   Complication of anesthesia    if given fentanyl has severe itching, rash and wheezing   Cubital tunnel syndrome on left 07/2023   Depression    Dysphagia    Dysrhythmia    SVT   Ehlers-Danlos syndrome    Female bladder prolapse    Gastritis    GERD (gastroesophageal reflux disease)    with pregnancy only   Hashimoto's thyroiditis    Hypothyroidism    Lupus    Mast cell activation syndrome (HCC)    Migraines    Osteoarthritis    Pelvic pain in antepartum period in second trimester 01/17/2014   Pneumonia    POTS (postural orthostatic tachycardia syndrome)    Pregnant 08/01/2015   Psoriatic arthritis (HCC)    PSVT (paroxysmal supraventricular tachycardia) (HCC)    could not be confirmed with 2016 study   Rectocele     Sjogren's syndrome (HCC)    Thoracic outlet syndrome    Thyroid disease    Urethral prolapse    Uterine prolapse    Vaginal Pap smear, abnormal     PSH:     Past Surgical History:  Procedure Laterality Date   ANKLE SURGERY Bilateral    2023 L ankle debridement and internal brace 2024 R ankle debridement and internal brace   ANTERIOR AND POSTERIOR VAGINAL REPAIR     ANTERIOR INTEROSSEOUS NERVE DECOMPRESSION Left 08/13/2023   Procedure: SUBCUTANEOUS ANTERIOR TRANSPOSITION OF ULNAR NERVE AT LEFT ELBOW;  Surgeon: Christena Flake, MD;  Location: [facility] ORS;  Service: Orthopedics;  Laterality: Left;   BREAST REDUCTION SURGERY Bilateral 08/20/2022   Procedure: MAMMARY REDUCTION  (BREAST) with Liposuction;  Surgeon: Janne Napoleon, MD;  Location: [facility] OR;  Service: Plastics;  Laterality: Bilateral;  3 hours   COLONOSCOPY WITH ESOPHAGOGASTRODUODENOSCOPY (EGD)  12/26/2022   CYSTOCELE REPAIR N/A 09/17/2017   Procedure: ANTERIOR REPAIR (CYSTOCELE);  Surgeon: Lazaro Arms, MD;  Location: [facility];  Service: Gynecology;  Laterality: N/A;   KNEE SURGERY Left 11/2021   KNEE SURGERY Right 2023   LIPOSUCTION  03/2022   PANNICULECTOMY N/A 04/28/2023   Procedure: PANNICULECTOMY;  Surgeon: Santiago Glad, MD;  Location: [facility] OR;  Service: Plastics;  Laterality: N/A;   SUPRAVENTRICULAR TACHYCARDIA ABLATION  01/05/2015   unsuccessful   SUPRAVENTRICULAR  TACHYCARDIA ABLATION N/A 01/05/2015   Procedure: SUPRAVENTRICULAR TACHYCARDIA ABLATION;  Surgeon: Marinus Maw, MD;  Location: [facility] CATH LAB;  Service: Cardiovascular;  Laterality: N/A;   Thoracic outlet decompression  2024   VAGINAL HYSTERECTOMY N/A 09/17/2017   Procedure: HYSTERECTOMY VAGINAL WITH POSSIBLE BILATERAL SALPINGECTOMY;  Surgeon: Lazaro Arms, MD;  Location: [facility];  Service: Gynecology;  Laterality: N/A;   WRIST GANGLION EXCISION Left    x2    POb/GynH:      OB History     Gravida  6   Para  6   Term  6   Preterm      AB       Living  6      SAB      IAB      Ectopic      Multiple  0   Live Births  6           SH:   Social History   Tobacco Use   Smoking status: Never    Passive exposure: Never   Smokeless tobacco: Never  Vaping Use   Vaping status: Never Used  Substance Use Topics   Alcohol use: No   Drug use: No    FH:    Family History  Problem Relation Age of Onset   Hypertension Mother    Hyperlipidemia Mother    Multiple sclerosis Mother    COPD Mother    Cancer Mother        melanoma x 2   Diabetes Mother    Heart disease Father        PSVT   Hyperlipidemia Brother    Hypertension Brother    Heart disease Maternal Grandfather        heart attack   Cancer Paternal Grandmother        pancreatic cancer   Heart disease Paternal Grandmother        MI   Hyperlipidemia Paternal Grandmother    Hypertension Paternal Grandmother    Asthma Daughter    Asthma Son    Autism Son    ADD / ADHD Son    Tourette syndrome Son    Other Son        Neurological and mental issues   ADD / ADHD Son      Allergies:  Allergies  Allergen Reactions   Apremilast Rash and Other (See Comments)    Mood changes Hallucination DMAR  Pt stated this med makes her depression worse, makes her angry.   Cosentyx [Secukinumab] Anaphylaxis   Fentanyl Itching, Other (See Comments) and Rash    wheezing   Hydroxychloroquine Palpitations and Other (See Comments)    Prolonged QT irregular heart rate   Metoclopramide Other (See Comments)    DO NOT GIVE VIA IV (can take PO) Causes Tremors, Jerking, High Heart Rate   Other Rash    Lidocaine Patches Adhesive Burn Scar Skin    Prochlorperazine Other (See Comments)    DO NOT GIVE VIA IV  (can take PO) Causes Tremors, Jerking, High Heart Rate   Tape Swelling and Rash    Use paper tape Angioedema, hives    Tegaderm Ag Mesh [Silver] Hives and Rash   Latex Dermatitis   Zetia [Ezetimibe] Nausea And Vomiting   Zofran [Ondansetron Hcl]     Prolonged  QT    Medications:      No current facility-administered medications for this encounter.  Current Outpatient Medications:    albuterol (VENTOLIN HFA) 108 (  90 Base) MCG/ACT inhaler, Inhale 1-2 puffs into the lungs every 6 (six) hours as needed for wheezing or shortness of breath., Disp: , Rfl:    Atogepant (QULIPTA) 60 MG TABS, Take 60 mg by mouth daily., Disp: , Rfl:    betamethasone dipropionate 0.05 % cream, Apply 1 Application topically 2 (two) times daily as needed (psoriasis)., Disp: , Rfl:    botulinum toxin Type A (BOTOX) 200 units injection, Inject 200 Units as directed every 3 (three) months., Disp: , Rfl:    buPROPion (WELLBUTRIN XL) 300 MG 24 hr tablet, Take 1 tablet by mouth daily., Disp: 90 tablet, Rfl: 2   calcium carbonate (TUMS EX) 750 MG chewable tablet, Chew 1 tablet by mouth as needed for heartburn., Disp: , Rfl:    cholecalciferol (VITAMIN D3) 25 MCG (1000 UNIT) tablet, Take 1,000 Units by mouth daily., Disp: , Rfl:    clobetasol cream (TEMOVATE) 0.05 %, Apply 1 Application topically daily as needed (psoriasis)., Disp: , Rfl:    desonide (DESOWEN) 0.05 % ointment, Apply 1 application  topically 2 (two) times daily as needed (psoriasis)., Disp: , Rfl:    diclofenac (VOLTAREN) 50 MG EC tablet, Take 2 tablets by mouth twice daily. (Patient taking differently: Take 50 mg by mouth daily.), Disp: 360 tablet, Rfl: 2   FIBER PO, Take 20 mg by mouth daily., Disp: , Rfl:    levothyroxine (SYNTHROID) 88 MCG tablet, Take 88 mcg by mouth daily before breakfast., Disp: , Rfl:    methylphenidate 10 MG ER tablet, TAKE ONE TABLET BY MOUTH IN THE MORNING, Disp: 30 tablet, Rfl: 0   Multiple Vitamin (MULTIVITAMIN WITH MINERALS) TABS tablet, Take 1 tablet by mouth daily., Disp: , Rfl:    nystatin cream (MYCOSTATIN), Apply 1 Application topically 2 (two) times daily as needed for dry skin (yeast)., Disp: , Rfl:    omeprazole (PRILOSEC) 20 MG capsule, Take 20 mg by mouth daily., Disp: , Rfl:     PREMARIN vaginal cream, Insert 1 gram vaginally nightly., Disp: 90 g, Rfl: 2   scopolamine (TRANSDERM-SCOP) 1 MG/3DAYS, Place 1 patch (1.5 mg total) onto the skin every 3 (three) days., Disp: 10 patch, Rfl: 1   docusate sodium (COLACE) 100 MG capsule, Take 1 capsule (100 mg total) by mouth 2 (two) times daily. (Patient not taking: Reported on 03/19/2024), Disp: 60 capsule, Rfl: 2   Guselkumab (TREMFYA) 100 MG/ML SOPN, Inject 1 Dose into the skin every 30 (thirty) days., Disp: , Rfl:    megestrol (MEGACE) 40 MG tablet, Take 1 tablet (40 mg total) by mouth daily. (Patient not taking: Reported on 03/19/2024), Disp: 30 tablet, Rfl: 0   metoCLOPramide (REGLAN) 10 MG tablet, Take 1 tablet (10 mg total) by mouth every 6 (six) hours as needed for nausea. (Patient not taking: Reported on 03/19/2024), Disp: 30 tablet, Rfl: 0   predniSONE (DELTASONE) 10 MG tablet, Take 1 tablet by mouth daily for 10 days (Patient not taking: Reported on 03/19/2024), Disp: 10 tablet, Rfl: 0   pregabalin (LYRICA) 100 MG capsule, Take 1 capsule (100 mg total) by mouth 2 (two) times daily. (Patient taking differently: Take 100 mg by mouth daily.), Disp: 180 capsule, Rfl: 3   promethazine (PHENERGAN) 12.5 MG tablet, Take 12.5 mg by mouth every 6 (six) hours as needed for nausea or vomiting. (Patient not taking: Reported on 03/19/2024), Disp: , Rfl:    promethazine (PHENERGAN) 25 MG suppository, Place 25 mg rectally every 6 (six) hours as needed for nausea or  vomiting., Disp: , Rfl:   Review of Systems:   Review of Systems  Constitutional: Negative for fever, chills, weight loss, malaise/fatigue and diaphoresis.  HENT: Negative for hearing loss, ear pain, nosebleeds, congestion, sore throat, neck pain, tinnitus and ear discharge.   Eyes: Negative for blurred vision, double vision, photophobia, pain, discharge and redness.  Respiratory: Negative for cough, hemoptysis, sputum production, shortness of breath, wheezing and stridor.    Cardiovascular: Negative for chest pain, palpitations, orthopnea, claudication, leg swelling and PND.  Gastrointestinal: Positive for abdominal pain. Negative for heartburn, nausea, vomiting, diarrhea, constipation, blood in stool and melena.  Genitourinary: Negative for dysuria, urgency, frequency, hematuria and flank pain.  Musculoskeletal: Negative for myalgias, back pain, joint pain and falls.  Skin: Negative for itching and rash.  Neurological: Negative for dizziness, tingling, tremors, sensory change, speech change, focal weakness, seizures, loss of consciousness, weakness and headaches.  Endo/Heme/Allergies: Negative for environmental allergies and polydipsia. Does not bruise/bleed easily.  Psychiatric/Behavioral: Negative for depression, suicidal ideas, hallucinations, memory loss and substance abuse. The patient is not nervous/anxious and does not have insomnia.      PHYSICAL EXAM:  Last menstrual period 10/23/2016.    Vitals reviewed. Constitutional: She is oriented to person, place, and time. She appears well-developed and well-nourished.  HENT:  Head: Normocephalic and atraumatic.  Right Ear: External ear normal.  Left Ear: External ear normal.  Nose: Nose normal.  Mouth/Throat: Oropharynx is clear and moist.  Eyes: Conjunctivae and EOM are normal. Pupils are equal, round, and reactive to light. Right eye exhibits no discharge. Left eye exhibits no discharge. No scleral icterus.  Neck: Normal range of motion. Neck supple. No tracheal deviation present. No thyromegaly present.  Cardiovascular: Normal rate, regular rhythm, normal heart sounds and intact distal pulses.  Exam reveals no gallop and no friction rub.   No murmur heard. Respiratory: Effort normal and breath sounds normal. No respiratory distress. She has no wheezes. She has no rales. She exhibits no tenderness.  GI: Soft. Bowel sounds are normal. She exhibits no distension and no mass. There is tenderness. There is  no rebound and no guarding.  Genitourinary:       Vulva is normal without lesions, floppy superiorly Vagina is pink moist without discharge Cervix absent Uterus is uterus absent Adnexa is per sonogram and the right ovary is adherent and painful on exam, left adnexa is normal on exam  Musculoskeletal: Normal range of motion. She exhibits no edema and no tenderness.  Neurological: She is alert and oriented to person, place, and time. She has normal reflexes. She displays normal reflexes. No cranial nerve deficit. She exhibits normal muscle tone. Coordination normal.  Skin: Skin is warm and dry. No rash noted. No erythema. No pallor.  Psychiatric: She has a normal mood and affect. Her behavior is normal. Judgment and thought content normal.    Labs: Results for orders placed or performed during the hospital encounter of 03/23/24 (from the past 2 weeks)  CBC   Collection Time: 03/23/24  3:04 PM  Result Value Ref Range   WBC 7.5 4.0 - 10.5 K/uL   RBC 4.22 3.87 - 5.11 MIL/uL   Hemoglobin 11.4 (L) 12.0 - 15.0 g/dL   HCT 84.6 (L) 96.2 - 95.2 %   MCV 81.5 80.0 - 100.0 fL   MCH 27.0 26.0 - 34.0 pg   MCHC 33.1 30.0 - 36.0 g/dL   RDW 84.1 32.4 - 40.1 %   Platelets 253 150 - 400 K/uL  nRBC 0.0 0.0 - 0.2 %  Comprehensive metabolic panel   Collection Time: 03/23/24  3:04 PM  Result Value Ref Range   Sodium 138 135 - 145 mmol/L   Potassium 3.2 (L) 3.5 - 5.1 mmol/L   Chloride 106 98 - 111 mmol/L   CO2 21 (L) 22 - 32 mmol/L   Glucose, Bld 105 (H) 70 - 99 mg/dL   BUN 6 6 - 20 mg/dL   Creatinine, Ser 0.98 0.44 - 1.00 mg/dL   Calcium 8.6 (L) 8.9 - 10.3 mg/dL   Total Protein 6.4 (L) 6.5 - 8.1 g/dL   Albumin 3.6 3.5 - 5.0 g/dL   AST 9 (L) 15 - 41 U/L   ALT 11 0 - 44 U/L   Alkaline Phosphatase 55 38 - 126 U/L   Total Bilirubin 0.2 0.0 - 1.2 mg/dL   GFR, Estimated >11 >91 mL/min   Anion gap 11 5 - 15  Rapid HIV screen (HIV 1/2 Ab+Ag)   Collection Time: 03/23/24  3:04 PM  Result Value Ref  Range   HIV-1 P24 Antigen - HIV24 NON REACTIVE NON REACTIVE   HIV 1/2 Antibodies NON REACTIVE NON REACTIVE   Interpretation (HIV Ag Ab)      A non reactive test result means that HIV 1 or HIV 2 antibodies and HIV 1 p24 antigen were not detected in the specimen.  Urinalysis, Routine w reflex microscopic -Urine, Clean Catch   Collection Time: 03/23/24  3:04 PM  Result Value Ref Range   Color, Urine COLORLESS (A) YELLOW   APPearance CLEAR CLEAR   Specific Gravity, Urine 1.002 (L) 1.005 - 1.030   pH 7.0 5.0 - 8.0   Glucose, UA NEGATIVE NEGATIVE mg/dL   Hgb urine dipstick SMALL (A) NEGATIVE   Bilirubin Urine NEGATIVE NEGATIVE   Ketones, ur NEGATIVE NEGATIVE mg/dL   Protein, ur NEGATIVE NEGATIVE mg/dL   Nitrite NEGATIVE NEGATIVE   Leukocytes,Ua NEGATIVE NEGATIVE   RBC / HPF 0-5 0 - 5 RBC/hpf   WBC, UA 0-5 0 - 5 WBC/hpf   Bacteria, UA RARE (A) NONE SEEN   Squamous Epithelial / HPF 0-5 0 - 5 /HPF  Type and screen   Collection Time: 03/23/24  3:04 PM  Result Value Ref Range   ABO/RH(D) A NEG    Antibody Screen NEG    Sample Expiration 04/06/2024,2359    Extend sample reason      NO TRANSFUSIONS OR PREGNANCY IN THE PAST 3 MONTHS Performed at [facility], [address] [place], [place] [address]   Results for orders placed or performed in visit on 03/18/24 (from the past 2 weeks)  Ovarian Malignancy Risk-ROMA   Collection Time: 03/18/24 12:24 PM  Result Value Ref Range   Cancer Antigen (CA) 125 12.1 0.0 - 38.1 U/mL   HE4 44.9 0.0 - 61.2 pmol/L   Premenopausal ROMA 0.58 See below   Postmenopausal ROMA 0.90 See below   Comment Comment   T4, free   Collection Time: 03/18/24 12:24 PM  Result Value Ref Range   Free T4 1.59 0.82 - 1.77 ng/dL  Premenopausal Interp: LOW   Collection Time: 03/18/24 12:24 PM  Result Value Ref Range   Premenopausal Interp: LOW Comment   Postmenopausal Interp: LOW   Collection Time: 03/18/24 12:24 PM  Result Value Ref Range   Postmenopausal Interp:  LOW Comment   Results for orders placed or performed during the hospital encounter of 03/15/24 (from the past 2 weeks)  Urinalysis, Routine w  reflex microscopic -Urine, Clean Catch   Collection Time: 03/15/24 10:21 PM  Result Value Ref Range   Color, Urine COLORLESS (A) YELLOW   APPearance CLEAR CLEAR   Specific Gravity, Urine 1.001 (L) 1.005 - 1.030   pH 6.0 5.0 - 8.0   Glucose, UA NEGATIVE NEGATIVE mg/dL   Hgb urine dipstick NEGATIVE NEGATIVE   Bilirubin Urine NEGATIVE NEGATIVE   Ketones, ur NEGATIVE NEGATIVE mg/dL   Protein, ur NEGATIVE NEGATIVE mg/dL   Nitrite NEGATIVE NEGATIVE   Leukocytes,Ua NEGATIVE NEGATIVE  Lipase, blood   Collection Time: 03/15/24 10:30 PM  Result Value Ref Range   Lipase 31 11 - 51 U/L  Comprehensive metabolic panel   Collection Time: 03/15/24 10:30 PM  Result Value Ref Range   Sodium 139 135 - 145 mmol/L   Potassium 2.9 (L) 3.5 - 5.1 mmol/L   Chloride 105 98 - 111 mmol/L   CO2 24 22 - 32 mmol/L   Glucose, Bld 103 (H) 70 - 99 mg/dL   BUN 6 6 - 20 mg/dL   Creatinine, Ser 8.11 0.44 - 1.00 mg/dL   Calcium 9.4 8.9 - 91.4 mg/dL   Total Protein 7.5 6.5 - 8.1 g/dL   Albumin 4.4 3.5 - 5.0 g/dL   AST 10 (L) 15 - 41 U/L   ALT 11 0 - 44 U/L   Alkaline Phosphatase 63 38 - 126 U/L   Total Bilirubin 0.6 0.0 - 1.2 mg/dL   GFR, Estimated >78 >29 mL/min   Anion gap 10 5 - 15  CBC   Collection Time: 03/15/24 10:30 PM  Result Value Ref Range   WBC 8.4 4.0 - 10.5 K/uL   RBC 4.76 3.87 - 5.11 MIL/uL   Hemoglobin 13.0 12.0 - 15.0 g/dL   HCT 56.2 13.0 - 86.5 %   MCV 81.9 80.0 - 100.0 fL   MCH 27.3 26.0 - 34.0 pg   MCHC 33.3 30.0 - 36.0 g/dL   RDW 78.4 69.6 - 29.5 %   Platelets 321 150 - 400 K/uL   nRBC 0.0 0.0 - 0.2 %  Magnesium   Collection Time: 03/15/24 10:30 PM  Result Value Ref Range   Magnesium 2.2 1.7 - 2.4 mg/dL    EKG: Orders placed or performed during the hospital encounter of 03/06/24   EKG 12-Lead   EKG 12-Lead    Imaging Studies: US  PELVIC COMPLETE W TRANSVAGINAL AND TORSION R/O Result Date: 03/16/2024 CLINICAL DATA:  Bilateral ovarian cysts with pelvic pain, initial EN EXAM: TRANSABDOMINAL AND TRANSVAGINAL ULTRASOUND OF PELVIS DOPPLER ULTRASOUND OF OVARIES TECHNIQUE: Both transabdominal and transvaginal ultrasound examinations of the pelvis were performed. Transabdominal technique was performed for global imaging of the pelvis including uterus, ovaries, adnexal regions, and pelvic cul-de-sac. It was necessary to proceed with endovaginal exam following the transabdominal exam to visualize the ovaries. Color and duplex Doppler ultrasound was utilized to evaluate blood flow to the ovaries. COMPARISON:  None Available. FINDINGS: Uterus Surgically removed Right ovary Measurements: 5.9 x 3.7 x 5.0 cm. = volume: 57.2 mL. 4.8 cm mildly complex cystic lesion is identified similar to that seen on the prior exam. A daughter cyst is noted within. These changes likely represent a hemorrhagic cyst. No evidence of torsion is seen. Left ovary Measurements: 3.0 x 1.7 x 2.4 cm = volume: 6.8 mL. 3.3 cm simple cyst is noted within the left ovary. Pulsed Doppler evaluation of both ovaries demonstrates normal low-resistance arterial and venous waveforms. Other findings No abnormal free fluid. IMPRESSION:  Bilateral ovarian cysts as described. Follow-up ultrasound in 6-12 weeks is recommended for evaluation of the right mildly complex ovarian cyst. No evidence of ovarian torsion. Electronically Signed   By: Alcide Clever M.D.   On: 03/16/2024 01:25   CT ABDOMEN PELVIS W CONTRAST Result Date: 03/15/2024 CLINICAL DATA:  Acute nonlocalized abdominal pain. Lower abdominal pain, upper abdominal pain, right shoulder pain, and belching. EXAM: CT ABDOMEN AND PELVIS WITH CONTRAST TECHNIQUE: Multidetector CT imaging of the abdomen and pelvis was performed using the standard protocol following bolus administration of intravenous contrast. RADIATION DOSE REDUCTION: This exam  was performed according to the departmental dose-optimization program which includes automated exposure control, adjustment of the mA and/or kV according to patient size and/or use of iterative reconstruction technique. CONTRAST:  OMNIPAQUE IOHEXOL 300 MG/ML  SOLN COMPARISON:  Ultrasound abdomen 03/06/2024. CT abdomen and pelvis 01/25/2024 and 04/28/2020 FINDINGS: Lower chest: Lung bases are clear. Hepatobiliary: Gallbladder is not visualized, either surgically absent or contracted. No focal liver lesions. No bile duct dilatation. Pancreas: Unremarkable. No pancreatic ductal dilatation or surrounding inflammatory changes. Spleen: Normal in size without focal abnormality. Adrenals/Urinary Tract: Adrenal glands are unremarkable. Kidneys are normal, without renal calculi, focal lesion, or hydronephrosis. Bladder is unremarkable. Stomach/Bowel: Stomach, small bowel, and colon are not abnormally distended. No wall thickening or inflammatory changes. Appendix is normal. Vascular/Lymphatic: No significant vascular findings are present. No enlarged abdominal or pelvic lymph nodes. Reproductive: Uterus is surgically absent. Right ovarian cyst measuring 4.4 cm maximal diameter. Left ovarian cyst measuring 2.9 cm maximal diameter. Other: No free air or free fluid in the abdomen. Abdominal wall musculature appears intact. Musculoskeletal: No acute or significant osseous findings. IMPRESSION: 1. No acute process demonstrated in the abdomen or pelvis. No evidence of bowel obstruction or inflammation. 2. Bilateral ovarian cysts. Most significant: Right ovarian simple-appearing cyst measuring 4.4 cm. No follow-up imaging is recommended. Reference: JACR 2020 Feb;17(2):248-254 Electronically Signed   By: Burman Nieves M.D.   On: 03/15/2024 23:51   DG Chest 2 View Result Date: 03/06/2024 CLINICAL DATA:  Right upper quadrant pain. Epigastric pain. Decreased p.o. intake, dehydration, nausea EXAM: CHEST - 2 VIEW COMPARISON:   02/10/2021 and CT 02/25/2024 FINDINGS: Stable cardiomediastinal silhouette. No focal consolidation, pleural effusion, or pneumothorax. No displaced rib fractures. IMPRESSION: No active cardiopulmonary disease. Electronically Signed   By: Minerva Fester M.D.   On: 03/06/2024 23:49   US Abdomen Limited RUQ (LIVER/GB) Result Date: 03/06/2024 CLINICAL DATA:  Upper quadrant pain EXAM: ULTRASOUND ABDOMEN LIMITED RIGHT UPPER QUADRANT COMPARISON:  CT 01/25/2024 FINDINGS: Gallbladder: Prior cholecystectomy Common bile duct: Diameter: Normal caliber, 3 mm Liver: No focal lesion identified. Within normal limits in parenchymal echogenicity. Portal vein is patent on color Doppler imaging with normal direction of blood flow towards the liver. Other: None. IMPRESSION: No acute findings. Electronically Signed   By: Charlett Nose M.D.   On: 03/06/2024 23:44   CT Angio Chest PE W and/or Wo Contrast Result Date: 02/25/2024 CLINICAL DATA:  Chest pain, PE suspected. History of autoimmune disease. EXAM: CT ANGIOGRAPHY CHEST WITH CONTRAST TECHNIQUE: Multidetector CT imaging of the chest was performed using the standard protocol during bolus administration of intravenous contrast. Multiplanar CT image reconstructions and MIPs were obtained to evaluate the vascular anatomy. RADIATION DOSE REDUCTION: This exam was performed according to the departmental dose-optimization program which includes automated exposure control, adjustment of the mA and/or kV according to patient size and/or use of iterative reconstruction technique. CONTRAST:  75mL OMNIPAQUE IOHEXOL  350 MG/ML SOLN COMPARISON:  CTA chest 01/02/2021 FINDINGS: Cardiovascular: Negative for acute pulmonary embolism. Normal caliber thoracic aorta. No pericardial effusion. Mediastinum/Nodes: Trachea and esophagus are unremarkable. No thoracic adenopathy Lungs/Pleura: No focal consolidation, pleural effusion, or pneumothorax. Upper Abdomen: No acute abnormality. Musculoskeletal: No  acute fracture. Review of the MIP images confirms the above findings. IMPRESSION: Negative for acute pulmonary embolism. No acute abnormality in the chest. Electronically Signed   By: Minerva Fester M.D.   On: 02/25/2024 23:49      Assessment: Right ovarian cyst, 4.5 cm, normal ROMA Right adnexa adherent to the right pelvic sidewall casuing pain Family history of ovarian cancer, mother Labial hypertrophy causing dyspareunia  Patient Active Problem List   Diagnosis Date Noted   Calculus of gallbladder without cholecystitis without obstruction 01/19/2024   Chronic cholecystitis 01/19/2024   S/P panniculectomy 04/28/2023   Fall 03/09/2019   Urinary retention 03/04/2019   Numbness 03/04/2019   Lhermitte's sign positive 03/04/2019   Dysphagia 03/04/2019   Acute nasopharyngitis 11/25/2018   Hematuria 11/25/2018   Burning with urination 11/25/2018   S/P vaginal hysterectomy 09/17/2017   Normal labor 08/10/2017   Uterine/bladder prolapse 02/13/2017   History of recurrent UTI (urinary tract infection) 02/27/2016   Migraines 10/03/2015   Supervision of normal pregnancy 09/05/2015   Trigger point of shoulder region 06/20/2015   Rh negative state in antepartum period 01/31/2014   Abnormal Pap smear of cervix 01/31/2014   Paroxysmal SVT (supraventricular tachycardia) (HCC) 11/09/2013   Asthma, stable 11/09/2013    Plan: RA laparoscopic BSO Labioplasty  Lazaro Arms 03/24/2024 8:46 AM

## 2024-03-24 NOTE — Discharge Instructions (Signed)

## 2024-03-24 NOTE — Anesthesia Preprocedure Evaluation (Addendum)
 Anesthesia Evaluation  Patient identified by MRN, date of birth, ID band Patient awake    Reviewed: Allergy & Precautions, H&P , NPO status , Patient's Chart, lab work & pertinent test results, reviewed documented beta blocker date and time   History of Anesthesia Complications (+) history of anesthetic complications  Airway Mallampati: II  TM Distance: >3 FB Neck ROM: full    Dental no notable dental hx. (+) Dental Advisory Given, Teeth Intact   Pulmonary asthma , pneumonia   Pulmonary exam normal breath sounds clear to auscultation       Cardiovascular Exercise Tolerance: Good hypertension, Normal cardiovascular exam+ dysrhythmias Supra Ventricular Tachycardia  Rhythm:regular Rate:Normal  POTS   Neuro/Psych  Headaches PSYCHIATRIC DISORDERS  Depression     Neuromuscular disease    GI/Hepatic Neg liver ROS,GERD  ,,  Endo/Other  Hypothyroidism    Renal/GU negative Renal ROS  negative genitourinary   Musculoskeletal  (+) Arthritis ,    Abdominal   Peds  Hematology  (+) Blood dyscrasia, anemia   Anesthesia Other Findings Lupus. Ehlers Danlos syndrome.  Sjogrens syndrome. Psoriatic arthritis.  Hashimotos thyroiditis. Mast Cell activation syndrome.  Thoracic outlet syndrome.  Will cover with decadron, benadryl, and famotidine.  Reproductive/Obstetrics negative OB ROS                              Anesthesia Physical Anesthesia Plan  ASA: 3  Anesthesia Plan: General   Post-op Pain Management: Minimal or no pain anticipated   Induction: Intravenous  PONV Risk Score and Plan: Propofol infusion  Airway Management Planned: Nasal Cannula and Natural Airway  Additional Equipment: None  Intra-op Plan:   Post-operative Plan:   Informed Consent: I have reviewed the patients History and Physical, chart, labs and discussed the procedure including the risks, benefits and alternatives for the  proposed anesthesia with the patient or authorized representative who has indicated his/her understanding and acceptance.     Dental Advisory Given  Plan Discussed with: CRNA  Anesthesia Plan Comments:        Anesthesia Quick Evaluation

## 2024-03-24 NOTE — Anesthesia Postprocedure Evaluation (Signed)
 Anesthesia Post Note  Patient: Sharon Rowland  Procedure(s) Performed: SALPINGO-OOPHORECTOMY, BILATERAL, ROBOT ASSISTED, LAPAROSCOPIC, D5 (Bilateral: Abdomen) LABIAPLASTY, VULVA (Vagina )  Patient location during evaluation: PACU Anesthesia Type: General Level of consciousness: awake and alert Pain management: pain level controlled Vital Signs Assessment: post-procedure vital signs reviewed and stable Respiratory status: spontaneous breathing, nonlabored ventilation, respiratory function stable and patient connected to nasal cannula oxygen Cardiovascular status: blood pressure returned to baseline and stable Postop Assessment: no apparent nausea or vomiting Anesthetic complications: no   There were no known notable events for this encounter.   Last Vitals:  Vitals:   03/24/24 1330 03/24/24 1332  BP: 105/62 105/62  Pulse: 78 69  Resp: 13 (!) 8  Temp:    SpO2: 99% 99%    Last Pain:  Vitals:   03/24/24 1332  TempSrc:   PainSc: 6                  Kianni Lheureux L Jaydan Chretien

## 2024-03-25 ENCOUNTER — Other Ambulatory Visit: Payer: Self-pay | Admitting: Obstetrics & Gynecology

## 2024-03-25 ENCOUNTER — Telehealth: Payer: Self-pay

## 2024-03-25 ENCOUNTER — Encounter (HOSPITAL_COMMUNITY): Payer: Self-pay | Admitting: Obstetrics & Gynecology

## 2024-03-25 MED ORDER — ESTRADIOL 0.1 MG/24HR TD PTTW: 1.0000 | MEDICATED_PATCH | TRANSDERMAL | 12 refills | Status: DC

## 2024-03-25 NOTE — Telephone Encounter (Signed)
 Patients husband called and stated that his wife had surgery yesterday with Dr. Despina Hidden and a estrogen patch was to be called in to the pharmacy.  Nothing was called in; wants to know if Dr. Despina Hidden is going to call it in.

## 2024-03-26 LAB — SURGICAL PATHOLOGY

## 2024-04-02 ENCOUNTER — Ambulatory Visit: Admitting: Obstetrics & Gynecology

## 2024-04-02 ENCOUNTER — Encounter: Payer: Self-pay | Admitting: Obstetrics & Gynecology

## 2024-04-02 VITALS — BP 107/79 | HR 74 | Ht 63.0 in | Wt 159.0 lb

## 2024-04-02 DIAGNOSIS — R300 Dysuria: Secondary | ICD-10-CM

## 2024-04-02 DIAGNOSIS — Z9889 Other specified postprocedural states: Secondary | ICD-10-CM

## 2024-04-02 LAB — POCT URINALYSIS DIPSTICK
Blood, UA: 4
Glucose, UA: NEGATIVE
Ketones, UA: NEGATIVE
Protein, UA: NEGATIVE

## 2024-04-02 MED ORDER — CIPROFLOXACIN HCL 500 MG PO TABS: 500.0000 mg | ORAL_TABLET | Freq: Two times a day (BID) | ORAL | 0 refills | Status: DC

## 2024-04-02 MED ORDER — OMEPRAZOLE 40 MG PO CPDR: 40.0000 mg | DELAYED_RELEASE_CAPSULE | Freq: Every day | ORAL | 4 refills | Status: DC

## 2024-04-02 NOTE — Progress Notes (Signed)
 HPI: Patient returns for routine postoperative follow-up having undergone RA LBSO + labioplasty on 03/24/24.  The patient's immediate postoperative recovery has been unremarkable. Since hospital discharge the patient reports no complaints.   Current Outpatient Medications: Atogepant (QULIPTA) 60 MG TABS, Take 60 mg by mouth daily., Disp: , Rfl:  betamethasone dipropionate 0.05 % cream, Apply 1 Application topically 2 (two) times daily as needed (psoriasis)., Disp: , Rfl:  botulinum toxin Type A (BOTOX) 200 units injection, Inject 200 Units as directed every 3 (three) months., Disp: , Rfl:  buPROPion (WELLBUTRIN XL) 300 MG 24 hr tablet, Take 1 tablet by mouth daily., Disp: 90 tablet, Rfl: 2 calcium carbonate (TUMS EX) 750 MG chewable tablet, Chew 1 tablet by mouth as needed for heartburn., Disp: , Rfl:  cholecalciferol (VITAMIN D3) 25 MCG (1000 UNIT) tablet, Take 1,000 Units by mouth daily., Disp: , Rfl:  ciprofloxacin (CIPRO) 500 MG tablet, Take 1 tablet (500 mg total) by mouth 2 (two) times daily., Disp: 14 tablet, Rfl: 0 clobetasol cream (TEMOVATE) 0.05 %, Apply 1 Application topically daily as needed (psoriasis)., Disp: , Rfl:  desonide (DESOWEN) 0.05 % ointment, Apply 1 application  topically 2 (two) times daily as needed (psoriasis)., Disp: , Rfl:  estradiol (VIVELLE-DOT) 0.1 MG/24HR patch, Place 1 patch (0.1 mg total) onto the skin 2 (two) times a week., Disp: 8 patch, Rfl: 12 FIBER PO, Take 20 mg by mouth daily., Disp: , Rfl:  levothyroxine (SYNTHROID) 88 MCG tablet, Take 88 mcg by mouth daily before breakfast., Disp: , Rfl:  methylphenidate 10 MG ER tablet, TAKE ONE TABLET BY MOUTH IN THE MORNING, Disp: 30 tablet, Rfl: 0 Multiple Vitamin (MULTIVITAMIN WITH MINERALS) TABS tablet, Take 1 tablet by mouth daily., Disp: , Rfl:  nystatin cream (MYCOSTATIN), Apply 1 Application topically 2 (two) times daily as needed for dry skin (yeast)., Disp: , Rfl:  omeprazole (PRILOSEC) 20 MG capsule,  Take 20 mg by mouth daily., Disp: , Rfl:  oxyCODONE-acetaminophen (PERCOCET) 7.5-325 MG tablet, Take 1 tablet by mouth every 6 (six) hours as needed., Disp: 28 tablet, Rfl: 0 pregabalin (LYRICA) 100 MG capsule, Take 1 capsule (100 mg total) by mouth 2 (two) times daily. (Patient taking differently: Take 100 mg by mouth daily.), Disp: 180 capsule, Rfl: 3 PREMARIN vaginal cream, Insert 1 gram vaginally nightly., Disp: 90 g, Rfl: 2  promethazine (PHENERGAN) 12.5 MG tablet, Take 12.5 mg by mouth every 6 (six) hours as needed for nausea or vomiting., Disp: , Rfl:  promethazine (PHENERGAN) 25 MG suppository, Place 25 mg rectally every 6 (six) hours as needed for nausea or vomiting., Disp: , Rfl:  promethazine (PHENERGAN) 25 MG tablet, Take 1 tablet (25 mg total) by mouth every 6 (six) hours as needed for nausea., Disp: 30 tablet, Rfl: 0 scopolamine (TRANSDERM-SCOP) 1 MG/3DAYS, Place 1 patch (1.5 mg total) onto the skin every 3 (three) days., Disp: 10 patch, Rfl: 1 albuterol (VENTOLIN HFA) 108 (90 Base) MCG/ACT inhaler, Inhale 1-2 puffs into the lungs every 6 (six) hours as needed for wheezing or shortness of breath. (Patient not taking: Reported on 04/02/2024), Disp: , Rfl:  diclofenac (VOLTAREN) 50 MG EC tablet, Take 2 tablets by mouth twice daily. (Patient not taking: Reported on 04/02/2024), Disp: 360 tablet, Rfl: 2 docusate sodium (COLACE) 100 MG capsule, Take 1 capsule (100 mg total) by mouth 2 (two) times daily. (Patient not taking: Reported on 03/19/2024), Disp: 60 capsule, Rfl: 2 Guselkumab (TREMFYA) 100 MG/ML SOPN, Inject 1 Dose into the skin every 30 (thirty)  days. (Patient not taking: Reported on 04/02/2024), Disp: , Rfl:  ketorolac (TORADOL) 10 MG tablet, Take 1 tablet (10 mg total) by mouth every 8 (eight) hours as needed. (Patient not taking: Reported on 04/02/2024), Disp: 15 tablet, Rfl: 0 metoCLOPramide (REGLAN) 10 MG tablet, Take 1 tablet (10 mg total) by mouth every 6 (six) hours as needed for  nausea. (Patient not taking: Reported on 03/19/2024), Disp: 30 tablet, Rfl: 0  No current facility-administered medications for this visit.    Blood pressure 107/79, pulse 74, height 5\' 3"  (1.6 m), weight 159 lb (72.1 kg), last menstrual period 10/23/2016.  Physical Exam: 3 incisions healing well Vulva both healing well no hswelling or hematoma  Diagnostic Tests:   Pathology: benign  Impression + Management plan: (R30.0) Burning with urination  (primary encounter diagnosis) Plan: POCT Urinalysis Dipstick   ICD-10-CM   1. Post-operative state  Z98.890     2. Burning with urination  R30.0 POCT Urinalysis Dipstick    Urine Culture          Medications Prescribed this encounter: Orders Placed This Encounter     ciprofloxacin (CIPRO) 500 MG tablet         Sig: Take 1 tablet (500 mg total) by mouth 2 (two) times daily.         Dispense:  14 tablet         Refill:  0     Follow up: No follow-ups on file.    Wendelyn Halter, MD Attending Physician for the Center for [facility] 04/02/2024 11:15 AM

## 2024-04-06 ENCOUNTER — Encounter: Payer: Self-pay | Admitting: Obstetrics & Gynecology

## 2024-04-06 DIAGNOSIS — G43119 Migraine with aura, intractable, without status migrainosus: Principal | ICD-10-CM

## 2024-04-06 LAB — URINE CULTURE

## 2024-04-30 ENCOUNTER — Other Ambulatory Visit: Payer: Self-pay | Admitting: Obstetrics & Gynecology

## 2024-05-03 ENCOUNTER — Ambulatory Visit: Admit: 2024-05-03 | Discharge: 2024-05-04 | Payer: BLUE CROSS/BLUE SHIELD

## 2024-05-03 DIAGNOSIS — G43119 Migraine with aura, intractable, without status migrainosus: Principal | ICD-10-CM

## 2024-05-06 ENCOUNTER — Other Ambulatory Visit: Payer: Self-pay | Admitting: *Deleted

## 2024-05-06 MED ORDER — ESTRADIOL 0.1 MG/24HR TD PTTW
1.0000 | MEDICATED_PATCH | TRANSDERMAL | 12 refills | Status: AC
Start: 2024-05-06 — End: ?

## 2024-05-10 ENCOUNTER — Encounter: Payer: Self-pay | Admitting: Obstetrics & Gynecology

## 2024-05-10 ENCOUNTER — Ambulatory Visit: Admitting: Obstetrics & Gynecology

## 2024-05-10 VITALS — BP 120/76 | HR 78 | Ht 63.0 in | Wt 158.0 lb

## 2024-05-10 DIAGNOSIS — Z48816 Encounter for surgical aftercare following surgery on the genitourinary system: Secondary | ICD-10-CM

## 2024-05-10 DIAGNOSIS — Z9889 Other specified postprocedural states: Secondary | ICD-10-CM

## 2024-05-10 NOTE — Progress Notes (Signed)
 HPI: Patient returns for routine postoperative follow-up having undergone RA BSO + labioplasty on 03/24/24.  The patient's immediate postoperative recovery has been unremarkable. Since hospital discharge the patient reports doing great.   Current Outpatient Medications: albuterol  (VENTOLIN  HFA) 108 (90 Base) MCG/ACT inhaler, Inhale 1-2 puffs into the lungs every 6 (six) hours as needed for wheezing or shortness of breath., Disp: , Rfl:  Atogepant (QULIPTA) 60 MG TABS, Take 60 mg by mouth daily., Disp: , Rfl:  betamethasone dipropionate 0.05 % cream, Apply 1 Application topically 2 (two) times daily as needed (psoriasis)., Disp: , Rfl:  botulinum toxin Type A (BOTOX) 200 units injection, Inject 200 Units as directed every 3 (three) months., Disp: , Rfl:  buPROPion  (WELLBUTRIN  XL) 300 MG 24 hr tablet, Take 1 tablet by mouth daily., Disp: 90 tablet, Rfl: 2 calcium carbonate (TUMS EX) 750 MG chewable tablet, Chew 1 tablet by mouth as needed for heartburn., Disp: , Rfl:  cholecalciferol (VITAMIN D3) 25 MCG (1000 UNIT) tablet, Take 1,000 Units by mouth daily., Disp: , Rfl:  ciprofloxacin  (CIPRO ) 500 MG tablet, Take 1 tablet (500 mg total) by mouth 2 (two) times daily., Disp: 14 tablet, Rfl: 0 clobetasol cream (TEMOVATE) 0.05 %, Apply 1 Application topically daily as needed (psoriasis)., Disp: , Rfl:  desonide (DESOWEN) 0.05 % ointment, Apply 1 application  topically 2 (two) times daily as needed (psoriasis)., Disp: , Rfl:  diclofenac  (VOLTAREN ) 50 MG EC tablet, Take 2 tablets by mouth twice daily., Disp: 360 tablet, Rfl: 2 docusate sodium  (COLACE) 100 MG capsule, Take 1 capsule (100 mg total) by mouth 2 (two) times daily., Disp: 60 capsule, Rfl: 2 estradiol  (VIVELLE -DOT) 0.1 MG/24HR patch, Place 1 patch (0.1 mg total) onto the skin 2 (two) times a week., Disp: 8 patch, Rfl: 12 FIBER PO, Take 20 mg by mouth daily., Disp: , Rfl:  levothyroxine  (SYNTHROID ) 88 MCG tablet, Take 88 mcg by mouth daily before  breakfast., Disp: , Rfl:  methylphenidate  10 MG ER tablet, TAKE ONE TABLET BY MOUTH IN THE MORNING, Disp: 30 tablet, Rfl: 0 Multiple Vitamin (MULTIVITAMIN WITH MINERALS) TABS tablet, Take 1 tablet by mouth daily., Disp: , Rfl:  nystatin cream (MYCOSTATIN), Apply 1 Application topically 2 (two) times daily as needed for dry skin (yeast)., Disp: , Rfl:  omeprazole  (PRILOSEC) 40 MG capsule, Take 1 capsule (40 mg total) by mouth daily., Disp: 30 capsule, Rfl: 4 pregabalin  (LYRICA ) 100 MG capsule, Take 1 capsule (100 mg total) by mouth 2 (two) times daily. (Patient taking differently: Take 100 mg by mouth daily.), Disp: 180 capsule, Rfl: 3 PREMARIN  vaginal cream, Insert 1 gram vaginally nightly., Disp: 90 g, Rfl: 1  scopolamine  (TRANSDERM-SCOP) 1 MG/3DAYS, Place 1 patch (1.5 mg total) onto the skin every 3 (three) days., Disp: 10 patch, Rfl: 1 Guselkumab (TREMFYA) 100 MG/ML SOPN, Inject 1 Dose into the skin every 30 (thirty) days. (Patient not taking: Reported on 04/02/2024), Disp: , Rfl:  metoCLOPramide  (REGLAN ) 10 MG tablet, Take 1 tablet (10 mg total) by mouth every 6 (six) hours as needed for nausea. (Patient not taking: Reported on 03/19/2024), Disp: 30 tablet, Rfl: 0 oxyCODONE -acetaminophen  (PERCOCET) 7.5-325 MG tablet, Take 1 tablet by mouth every 6 (six) hours as needed. (Patient not taking: Reported on 05/10/2024), Disp: 28 tablet, Rfl: 0 promethazine  (PHENERGAN ) 12.5 MG tablet, Take 12.5 mg by mouth every 6 (six) hours as needed for nausea or vomiting. (Patient not taking: Reported on 05/10/2024), Disp: , Rfl:  promethazine  (PHENERGAN ) 25 MG suppository, Place 25 mg  rectally every 6 (six) hours as needed for nausea or vomiting. (Patient not taking: Reported on 05/10/2024), Disp: , Rfl:  promethazine  (PHENERGAN ) 25 MG tablet, Take 1 tablet (25 mg total) by mouth every 6 (six) hours as needed for nausea. (Patient not taking: Reported on 05/10/2024), Disp: 30 tablet, Rfl: 0  No current  facility-administered medications for this visit.    Blood pressure 120/76, pulse 78, height 5\' 3"  (1.6 m), weight 158 lb (71.7 kg), last menstrual period 10/23/2016.  Physical Exam: Abdominal incisions all well healed Both labia are well healed no s/s infection, 1 small area on right of hypertrophy that can easily be handled in the office if needed  Diagnostic Tests:   Pathology: benign  Impression + Management plan:   ICD-10-CM   1. Post-operative state: RA BSO + bilateral labioplasty  Z98.890           Medications Prescribed this encounter: No orders of the defined types were placed in this encounter.     Follow up: prn    Wendelyn Halter, MD Attending Physician for the [facility] 05/10/2024 9:06 AM

## 2024-05-16 ENCOUNTER — Ambulatory Visit
Admit: 2024-05-16 | Discharge: 2024-05-17 | Payer: BLUE CROSS/BLUE SHIELD | Attending: Student in an Organized Health Care Education/Training Program | Primary: Student in an Organized Health Care Education/Training Program

## 2024-05-16 DIAGNOSIS — R309 Painful micturition, unspecified: Principal | ICD-10-CM

## 2024-05-16 DIAGNOSIS — N390 Urinary tract infection, site not specified: Principal | ICD-10-CM

## 2024-05-16 MED ORDER — PREDNISONE 10 MG TABLET
ORAL_TABLET | ORAL | 0 refills | 12.00000 days | Status: CP
Start: 2024-05-16 — End: 2024-05-28

## 2024-05-16 MED ORDER — CIPROFLOXACIN 500 MG TABLET
ORAL_TABLET | Freq: Two times a day (BID) | ORAL | 0 refills | 5.00000 days | Status: CP
Start: 2024-05-16 — End: 2024-05-21

## 2024-05-18 MED ORDER — DOCUSATE SODIUM 100 MG CAPSULE
ORAL_CAPSULE | Freq: Two times a day (BID) | ORAL | 0 refills | 14.00000 days
Start: 2024-05-18 — End: 2024-06-01

## 2024-05-18 MED ORDER — ONDANSETRON 4 MG DISINTEGRATING TABLET
ORAL_TABLET | Freq: Four times a day (QID) | ORAL | 0 refills | 3.00000 days | PRN
Start: 2024-05-18 — End: 2024-06-01

## 2024-05-18 MED ORDER — ACETAMINOPHEN 500 MG TABLET
ORAL_TABLET | Freq: Three times a day (TID) | ORAL | 1 refills | 7.00000 days
Start: 2024-05-18 — End: 2024-06-01

## 2024-05-18 MED ORDER — OXYCODONE 5 MG TABLET
ORAL_TABLET | Freq: Four times a day (QID) | ORAL | 0 refills | 4.00000 days | PRN
Start: 2024-05-18 — End: 2024-05-23

## 2024-05-18 MED ORDER — GABAPENTIN 100 MG CAPSULE
ORAL_CAPSULE | Freq: Three times a day (TID) | ORAL | 0 refills | 5.00000 days
Start: 2024-05-18 — End: 2024-05-23

## 2024-05-21 ENCOUNTER — Ambulatory Visit
Admit: 2024-05-21 | Discharge: 2024-05-22 | Payer: BLUE CROSS/BLUE SHIELD | Attending: Student in an Organized Health Care Education/Training Program | Primary: Student in an Organized Health Care Education/Training Program

## 2024-05-21 DIAGNOSIS — Q796 Ehlers-Danlos syndrome, unspecified: Principal | ICD-10-CM

## 2024-05-23 ENCOUNTER — Emergency Department (HOSPITAL_COMMUNITY)

## 2024-05-23 ENCOUNTER — Emergency Department (HOSPITAL_COMMUNITY)
Admission: EM | Admit: 2024-05-23 | Discharge: 2024-05-23 | Disposition: A | Discharge status: Home / Self Care | Attending: Emergency Medicine | Admitting: Emergency Medicine

## 2024-05-23 ENCOUNTER — Encounter (HOSPITAL_COMMUNITY): Payer: Self-pay

## 2024-05-23 ENCOUNTER — Other Ambulatory Visit: Payer: Self-pay

## 2024-05-23 DIAGNOSIS — E876 Hypokalemia: Secondary | ICD-10-CM | POA: Diagnosis not present

## 2024-05-23 DIAGNOSIS — R072 Precordial pain: Secondary | ICD-10-CM | POA: Insufficient documentation

## 2024-05-23 DIAGNOSIS — R9431 Abnormal electrocardiogram [ECG] [EKG]: Secondary | ICD-10-CM

## 2024-05-23 DIAGNOSIS — R0602 Shortness of breath: Secondary | ICD-10-CM | POA: Diagnosis not present

## 2024-05-23 DIAGNOSIS — R0789 Other chest pain: Secondary | ICD-10-CM

## 2024-05-23 LAB — CBC
HCT: 40.7 % (ref 36.0–46.0)
Hemoglobin: 13.5 g/dL (ref 12.0–15.0)
MCH: 27.1 pg (ref 26.0–34.0)
MCHC: 33.2 g/dL (ref 30.0–36.0)
MCV: 81.7 fL (ref 80.0–100.0)
Platelets: 323 10*3/uL (ref 150–400)
RBC: 4.98 MIL/uL (ref 3.87–5.11)
RDW: 13.9 % (ref 11.5–15.5)
WBC: 9.7 10*3/uL (ref 4.0–10.5)
nRBC: 0 % (ref 0.0–0.2)

## 2024-05-23 LAB — COMPREHENSIVE METABOLIC PANEL WITH GFR
ALT: 14 U/L (ref 0–44)
AST: 10 U/L — ABNORMAL LOW (ref 15–41)
Albumin: 4.5 g/dL (ref 3.5–5.0)
Alkaline Phosphatase: 66 U/L (ref 38–126)
Anion gap: 11 (ref 5–15)
BUN: 9 mg/dL (ref 6–20)
CO2: 24 mmol/L (ref 22–32)
Calcium: 9.6 mg/dL (ref 8.9–10.3)
Chloride: 102 mmol/L (ref 98–111)
Creatinine, Ser: 0.71 mg/dL (ref 0.44–1.00)
GFR, Estimated: >60 mL/min (ref >60)
Glucose, Bld: 95 mg/dL (ref 70–99)
Potassium: 3.3 mmol/L — ABNORMAL LOW (ref 3.5–5.1)
Sodium: 137 mmol/L (ref 135–145)
Total Bilirubin: 0.6 mg/dL (ref 0.0–1.2)
Total Protein: 7.7 g/dL (ref 6.5–8.1)

## 2024-05-23 LAB — TROPONIN I (HIGH SENSITIVITY)
Troponin I (High Sensitivity): <2 ng/L (ref <18)
Troponin I (High Sensitivity): <2 ng/L (ref <18)

## 2024-05-23 LAB — MAGNESIUM: Magnesium: 2.1 mg/dL (ref 1.7–2.4)

## 2024-05-23 LAB — D-DIMER, QUANTITATIVE: D-Dimer, Quant: 0.4 ug{FEU}/mL (ref 0.00–0.50)

## 2024-05-23 MED ORDER — SODIUM CHLORIDE 0.9 % IV BOLUS
1000.0000 mL | Freq: Once | INTRAVENOUS | Status: AC
  Administered 2024-05-23: 1000 mL via INTRAVENOUS

## 2024-05-23 MED ORDER — POTASSIUM CHLORIDE CRYS ER 20 MEQ PO TBCR
40.0000 meq | EXTENDED_RELEASE_TABLET | Freq: Once | ORAL | Status: AC
  Administered 2024-05-23: 40 meq via ORAL
  Filled 2024-05-23: qty 2

## 2024-05-23 MED ORDER — KETOROLAC TROMETHAMINE 15 MG/ML IJ SOLN
15.0000 mg | Freq: Once | INTRAMUSCULAR | Status: AC
  Administered 2024-05-23: 15 mg via INTRAVENOUS
  Filled 2024-05-23: qty 1

## 2024-05-23 MED ORDER — PROMETHAZINE HCL 25 MG RE SUPP: 25.0000 mg | Freq: Four times a day (QID) | RECTAL | 0 refills | Status: DC | PRN

## 2024-05-23 MED ORDER — DIPHENHYDRAMINE HCL 50 MG/ML IJ SOLN
25.0000 mg | Freq: Once | INTRAMUSCULAR | Status: AC
  Administered 2024-05-23: 25 mg via INTRAVENOUS
  Filled 2024-05-23: qty 1

## 2024-05-23 MED ORDER — MORPHINE SULFATE (PF) 2 MG/ML IV SOLN
2.0000 mg | Freq: Once | INTRAVENOUS | Status: AC
  Administered 2024-05-23: 2 mg via INTRAVENOUS
  Filled 2024-05-23: qty 1

## 2024-05-23 MED ORDER — PROMETHAZINE HCL 25 MG PO TABS: 25.0000 mg | ORAL_TABLET | Freq: Four times a day (QID) | ORAL | 0 refills | Status: AC | PRN

## 2024-05-23 NOTE — ED Provider Notes (Signed)
 [place] EMERGENCY DEPARTMENT AT [facility] Provider Note   CSN: [id number] Arrival date & time: 05/23/24  1538     History  Chief Complaint  Patient presents with   Chest Pain    Sharon Rowland is a 38 y.o. female.   Chest Pain   38 year old female presents emergency department with complaint of chest pain.  Chest pain began around 30 to 45 minutes prior to arrival.  Patient states that she was changing close when symptoms began.  Patient reports midsternal chest pain with some radiation to her back.  Reports pain worsened with certain movements.  Also reports some feelings of shortness of breath.  Denies any fever, cough, abdominal pain, nausea, vomiting, urinary symptoms, change in bowel habits.  Does report history of pneumothorax but this was from a shoulder operation.  Denies any history of DVT/PE, mobilization, known coagulopathy, no malignancy.  Patient is on hormonal therapy as she had bilateral salpingo-oophorectomy.  Also history of lupus, Ehlers-Danlos syndrome.  Past medical history significant for Sjogren's syndrome, lupus, Ehlers-Danlos syndrome, paroxysmal SVT, thoracic outlet syndrome, POTS, Hashimoto's thyroiditis, GERD  Home Medications Prior to Admission medications   Medication Sig Start Date End Date Taking? Authorizing Provider  albuterol  (VENTOLIN  HFA) 108 (90 Base) MCG/ACT inhaler Inhale 1-2 puffs into the lungs every 6 (six) hours as needed for wheezing or shortness of breath.    [provider]  Atogepant (QULIPTA) 60 MG TABS Take 60 mg by mouth daily.    [provider]  betamethasone dipropionate 0.05 % cream Apply 1 Application topically 2 (two) times daily as needed (psoriasis).    [provider]  botulinum toxin Type A (BOTOX) 200 units injection Inject 200 Units as directed every 3 (three) months.    [provider]  buPROPion  (WELLBUTRIN  XL) 300 MG 24 hr tablet Take 1 tablet by mouth daily. 09/05/23    Wendelyn Halter, MD  calcium carbonate (TUMS EX) 750 MG chewable tablet Chew 1 tablet by mouth as needed for heartburn.    [provider]  cholecalciferol (VITAMIN D3) 25 MCG (1000 UNIT) tablet Take 1,000 Units by mouth daily.    [provider]  ciprofloxacin  (CIPRO ) 250 MG tablet Take 500 mg by mouth 2 (two) times daily. 05/16/24   [provider]  clobetasol cream (TEMOVATE) 0.05 % Apply 1 Application topically daily as needed (psoriasis).    [provider]  desonide (DESOWEN) 0.05 % ointment Apply 1 application  topically 2 (two) times daily as needed (psoriasis). 04/24/20   [provider]  diclofenac  (VOLTAREN ) 50 MG EC tablet Take 2 tablets by mouth twice daily. 09/05/23   Wendelyn Halter, MD  docusate sodium  (COLACE) 100 MG capsule Take 1 capsule (100 mg total) by mouth 2 (two) times daily. 01/19/24 01/18/25  Pappayliou, Bynum Cassis A, DO  estradiol  (VIVELLE -DOT) 0.1 MG/24HR patch Place 1 patch (0.1 mg total) onto the skin 2 (two) times a week. 05/06/24   Wendelyn Halter, MD  FIBER PO Take 20 mg by mouth daily.    [provider]  Guselkumab (TREMFYA) 100 MG/ML SOPN Inject 1 Dose into the skin every 30 (thirty) days. Patient not taking: Reported on 04/02/2024    [provider]  levothyroxine  (SYNTHROID ) 88 MCG tablet Take 88 mcg by mouth daily before breakfast. 10/28/22   [provider]  methylphenidate  10 MG ER tablet TAKE ONE TABLET BY MOUTH IN THE MORNING 04/18/23   Wendelyn Halter, MD  metoCLOPramide  (REGLAN ) 10 MG tablet Take 1 tablet (10 mg total) by mouth every 6 (six) hours as needed for nausea. Patient not taking: Reported on 03/19/2024 12/29/23   [place] Butter, PA  Multiple Vitamin (MULTIVITAMIN WITH MINERALS) TABS tablet Take 1 tablet by mouth daily.    [provider]  nystatin cream (MYCOSTATIN) Apply 1 Application topically 2 (two) times daily as needed for dry skin (yeast).    [provider]   omeprazole  (PRILOSEC) 40 MG capsule Take 1 capsule (40 mg total) by mouth daily. 04/02/24   Wendelyn Halter, MD  oxyCODONE -acetaminophen  (PERCOCET) 7.5-325 MG tablet Take 1 tablet by mouth every 6 (six) hours as needed. Patient not taking: Reported on 05/10/2024 03/24/24   Wendelyn Halter, MD  predniSONE  (DELTASONE ) 10 MG tablet Take 10 mg by mouth as directed. Dose pack. Take 4 tablets (40 mg total) by mouth daily for 3 days, THEN 3 tablets (30 mg total) daily for 3 days, THEN 2 tablets (20 mg total) daily for 3 days, THEN 1 tablet (10 mg total) daily for 3 days. 05/16/24 05/28/24  [provider]  pregabalin  (LYRICA ) 100 MG capsule Take 1 capsule (100 mg total) by mouth 2 (two) times daily. Patient taking differently: Take 100 mg by mouth daily. 10/23/22   Wendelyn Halter, MD  PREMARIN  vaginal cream Insert 1 gram vaginally nightly. 05/02/24   Wendelyn Halter, MD  promethazine  (PHENERGAN ) 12.5 MG tablet Take 12.5 mg by mouth every 6 (six) hours as needed for nausea or vomiting. Patient not taking: Reported on 05/10/2024 11/26/23   [provider]  promethazine  (PHENERGAN ) 25 MG suppository Place 25 mg rectally every 6 (six) hours as needed for nausea or vomiting. Patient not taking: Reported on 05/10/2024    [provider]  promethazine  (PHENERGAN ) 25 MG tablet Take 1 tablet (25 mg total) by mouth every 6 (six) hours as needed for nausea. Patient not taking: Reported on 05/10/2024 03/24/24   Wendelyn Halter, MD  scopolamine  (TRANSDERM-SCOP) 1 MG/3DAYS Place 1 patch (1.5 mg total) onto the skin every 3 (three) days. 01/25/24   Early Glisson, MD      Allergies    Apremilast, Cosentyx [secukinumab], Fentanyl , Hydroxychloroquine, Metoclopramide , Other, Prochlorperazine , Semaglutide, Silver , Tape, Latex, Zofran  [ondansetron  hcl], and Ezetimibe    Review of Systems   Review of Systems  Cardiovascular:  Positive for chest pain.  All other systems reviewed and are negative.   Physical  Exam Updated Vital Signs BP 127/86 (BP Location: Right Arm)   Pulse (!) 111   Temp (!) 97.5 F (36.4 C)   Resp 19   Ht 5\' 3"  (1.6 m)   Wt 67.6 kg   LMP 10/23/2016 (Approximate)   SpO2 98%   BMI 26.39 kg/m  Physical Exam Vitals and nursing note reviewed.  Constitutional:      General: She is not in acute distress.    Appearance: She is well-developed.  HENT:     Head: Normocephalic and atraumatic.  Eyes:     Conjunctiva/sclera: Conjunctivae normal.  Cardiovascular:     Rate and Rhythm: Normal rate and regular rhythm.     Pulses: Normal pulses.     Heart sounds: No murmur heard. Pulmonary:     Effort: Pulmonary effort is normal. No respiratory distress.     Breath sounds: Normal breath sounds. No wheezing, rhonchi or rales.  Chest:     Chest wall: No tenderness.  Abdominal:     Palpations: Abdomen  is soft.     Tenderness: There is no abdominal tenderness.  Musculoskeletal:        General: No swelling.     Cervical back: Neck supple.     Right lower leg: No edema.     Left lower leg: No edema.  Skin:    General: Skin is warm and dry.     Capillary Refill: Capillary refill takes less than 2 seconds.  Neurological:     Mental Status: She is alert.  Psychiatric:        Mood and Affect: Mood normal.     ED Results / Procedures / Treatments   Labs (all labs ordered are listed, but only abnormal results are displayed) Labs Reviewed  CBC  COMPREHENSIVE METABOLIC PANEL WITH GFR  D-DIMER, QUANTITATIVE  MAGNESIUM  TROPONIN I (HIGH SENSITIVITY)    EKG EKG Interpretation Date/Time:  Sunday May 23 2024 15:50:16 EDT Ventricular Rate:  120 PR Interval:  144 QRS Duration:  86 QT Interval:  436 QTC Calculation: 616 R Axis:   99  Text Interpretation: Sinus tachycardia Rightward axis Cannot rule out Anterior infarct , age undetermined T wave abnormality, consider inferolateral ischemia Prolonged QT Abnormal ECG When compared with ECG of 06-Mar-2024 20:52, PREVIOUS ECG  IS PRESENT Confirmed by Sueellen Emery [phone]) on 05/23/2024 3:59:43 PM  Radiology No results found.  Procedures Procedures    Medications Ordered in ED Medications  sodium chloride  0.9 % bolus 1,000 mL (1,000 mLs Intravenous New Bag/Given 05/23/24 1631)  morphine  (PF) 2 MG/ML injection 2 mg (2 mg Intravenous Given 05/23/24 1626)  diphenhydrAMINE  (BENADRYL ) injection 25 mg (25 mg Intravenous Given 05/23/24 1624)    ED Course/ Medical Decision Making/ A&P                                 Medical Decision Making Amount and/or Complexity of Data Reviewed Labs: ordered. Radiology: ordered.  Risk Prescription drug management.   This patient presents to the ED for concern of chest pain, this involves an extensive number of treatment options, and is a complaint that carries with it a high risk of complications and morbidity.  The differential diagnosis includes ACS, PE, pneumothorax, GERD, pericarditis/myocarditis/tamponade, anxiety, aortic dissection, MSK, other   Co morbidities that complicate the patient evaluation  See HPI   Additional history obtained:  Additional history obtained from EMR External records from outside source obtained and reviewed including hospital records   Lab Tests:  I Ordered, and personally interpreted labs.  The pertinent results include: No leukocytosis.  No evidence of anemia.  Platelets within range.  Mild hypokalemia 3.3 otherwise, electrolytes within limits.  No transaminitis.  No renal dysfunction.  D-dimer negative.  Magnesium normal.  Initial troponin +2 with repeat pending.   Imaging Studies ordered:  I ordered imaging studies including chest x-ray I independently visualized and interpreted imaging which showed no acute cardiopulmonary abnormality. I agree with the radiologist interpretation  Cardiac Monitoring: / EKG:  The patient was maintained on a cardiac monitor.  I personally viewed and interpreted the cardiac monitored which showed  an underlying rhythm of: sinus tachycardia.  Right axis deviation.  T wave abnormality inferior lateral leads.  Prolonged QT   Consultations Obtained:  I requested consultation with attending Dr. Scarlette Currier who is in agreement with treatment plan going forward   Problem List / ED Course / Critical interventions / Medication management  Chest pain I ordered medication  including 1 L normal saline, morphine , Benadryl    Reevaluation of the patient after these medicines showed that the patient improved I have reviewed the patients home medicines and have made adjustments as needed   Social Determinants of Health:  Denies tobacco, licit drug use.   Test / Admission - Considered:  Chest pain Vitals signs within normal range and stable throughout visit. Laboratory/imaging studies significant for: See above 38 year old female presents emergency department with complaint of chest pain.  Chest pain began around 30 to 45 minutes prior to arrival.  Patient states that she was changing close when symptoms began.  Patient reports midsternal chest pain with some radiation to her back.  Reports pain worsened with certain movements.  Also reports some feelings of shortness of breath.  Denies any fever, cough, abdominal pain, nausea, vomiting, urinary symptoms, change in bowel habits.  Does report history of pneumothorax but this was from a shoulder operation.  Denies any history of DVT/PE, mobilization, known coagulopathy, no malignancy.  Patient is on hormonal therapy as she had bilateral salpingo-oophorectomy.  Also history of lupus, Ehlers-Danlos syndrome. On examination today, lungs clear to auscultation bilateral.  No abdominal or chest wall tenderness.  Workup today reassuring.  Patient with negative initial troponin; pending second one attending physician change for ACS rule out.  Negative D-dimer; low suspicion for PE.  Patient without hypertension, pulse deficits, neurodeficits; low suspicion for  aortic dissection.  Patient was treated with medications and is significant improvement of symptoms.  At time of shift change, pending second troponin for ACS rule out.  Also pending repeat EKG given the patient's initial QTc was in the 600s.  Patient care handed off to PA-C [place].  Patient stable upon shift change.        Final Clinical Impression(s) / ED Diagnoses Final diagnoses:  None    Rx / DC Orders ED Discharge Orders     None         [place] Butter, [place] 05/23/24 1857    Sueellen Emery, MD 05/23/24 1914

## 2024-05-23 NOTE — ED Provider Notes (Signed)
 Patient was signed out to myself at shift change.  Repeat EKG demonstrated improved QTc.  Would still recommend weaning her scopolamine  patch at this point.  Will provide additional antiemetics at home.  Do not suspect ACS at this time given her negative serial troponins as well as negative serial EKGs.  D-dimer was negative and do not suspect pulmonary embolus.  Chest x-ray demonstrated no signs of acute process.  Symptomology does not appear to be consistent with aortic aneurysm or dissection.  Symptoms are nonpositional in nature and do not suspect peritonitis or myocarditis.  The need for close follow-up with primary care doctor was discussed as well as strict return precautions for any new or worsening symptoms.  Patient voiced understanding to the plan and had no additional questions.   Roselynn Connors, PA-C 05/23/24 2059    Sueellen Emery, MD 05/23/24 [phone]

## 2024-05-23 NOTE — Discharge Instructions (Addendum)
 Please follow-up with your primary care doctor on an outpatient basis.  Return to emergency department immediately for any new or worsening symptoms.  Would recommend weaning your scopolamine  patch at this time.  Do not use the oral and suppository form of the Phenergan  together.

## 2024-05-23 NOTE — ED Triage Notes (Signed)
 Pt arrives ambulatory to ED with c/o chest pain reports history of pneumothorax. States she is having trouble catching her breath. Pain started about 45 minutes ago.

## 2024-05-23 NOTE — ED Notes (Signed)
 Pt asleep at this time with family at bedside.

## 2024-05-26 ENCOUNTER — Inpatient Hospital Stay: Admit: 2024-05-26 | Discharge: 2024-05-26 | Payer: BLUE CROSS/BLUE SHIELD

## 2024-05-26 ENCOUNTER — Ambulatory Visit: Admit: 2024-05-26 | Discharge: 2024-05-26 | Payer: BLUE CROSS/BLUE SHIELD

## 2024-05-26 ENCOUNTER — Ambulatory Visit: Admit: 2024-05-26 | Discharge: 2024-05-27 | Payer: BLUE CROSS/BLUE SHIELD

## 2024-05-26 ENCOUNTER — Emergency Department
Admit: 2024-05-26 | Discharge: 2024-05-27 | Disposition: A | Discharge status: Home / Self Care | Payer: BLUE CROSS/BLUE SHIELD | Attending: Emergency Medicine

## 2024-05-26 DIAGNOSIS — J986 Disorders of diaphragm: Principal | ICD-10-CM

## 2024-05-26 MED ORDER — DOCUSATE SODIUM 100 MG CAPSULE
ORAL_CAPSULE | Freq: Two times a day (BID) | ORAL | 0 refills | 14.00000 days | Status: CP
Start: 2024-05-26 — End: 2024-06-09

## 2024-05-26 MED ORDER — OXYCODONE 5 MG TABLET
ORAL_TABLET | ORAL | 0 refills | 3.00000 days | Status: CP | PRN
Start: 2024-05-26 — End: 2024-05-31

## 2024-05-26 MED ORDER — ONDANSETRON 4 MG DISINTEGRATING TABLET
ORAL_TABLET | Freq: Four times a day (QID) | ORAL | 0 refills | 3.00000 days | Status: CP | PRN
Start: 2024-05-26 — End: 2024-06-09

## 2024-05-26 MED ORDER — ACETAMINOPHEN 500 MG TABLET
ORAL_TABLET | Freq: Three times a day (TID) | ORAL | 1 refills | 7.00000 days | Status: CP
Start: 2024-05-26 — End: 2024-06-09

## 2024-05-29 ENCOUNTER — Emergency Department
Admit: 2024-05-29 | Discharge: 2024-05-29 | Disposition: A | Discharge status: Home / Self Care | Payer: BLUE CROSS/BLUE SHIELD

## 2024-05-29 DIAGNOSIS — R000 Tachycardia, unspecified: Principal | ICD-10-CM

## 2024-06-06 ENCOUNTER — Encounter: Payer: Self-pay | Admitting: Obstetrics & Gynecology

## 2024-06-15 ENCOUNTER — Ambulatory Visit: Admit: 2024-06-15 | Discharge: 2024-06-16 | Payer: BLUE CROSS/BLUE SHIELD

## 2024-06-15 DIAGNOSIS — M25512 Pain in left shoulder: Principal | ICD-10-CM

## 2024-06-15 DIAGNOSIS — M25312 Other instability, left shoulder: Principal | ICD-10-CM

## 2024-06-21 DIAGNOSIS — G540 Brachial plexus disorders: Principal | ICD-10-CM

## 2024-06-23 DIAGNOSIS — G540 Brachial plexus disorders: Principal | ICD-10-CM

## 2024-06-24 MED ORDER — DIAZEPAM 5 MG TABLET
ORAL_TABLET | Freq: Once | ORAL | 0 refills | 1.00000 days | Status: CP
Start: 2024-06-24 — End: 2024-06-24

## 2024-06-28 ENCOUNTER — Encounter: Payer: Self-pay | Admitting: *Deleted

## 2024-06-28 ENCOUNTER — Inpatient Hospital Stay: Admit: 2024-06-28 | Discharge: 2024-07-01 | Payer: BLUE CROSS/BLUE SHIELD

## 2024-06-28 ENCOUNTER — Ambulatory Visit: Admit: 2024-06-28 | Discharge: 2024-07-01 | Payer: BLUE CROSS/BLUE SHIELD

## 2024-06-28 ENCOUNTER — Encounter: Admit: 2024-06-28 | Discharge: 2024-07-01 | Payer: BLUE CROSS/BLUE SHIELD

## 2024-06-28 ENCOUNTER — Encounter: Admit: 2024-06-28 | Payer: BLUE CROSS/BLUE SHIELD

## 2024-07-01 ENCOUNTER — Encounter: Payer: Self-pay | Admitting: *Deleted

## 2024-07-01 MED ORDER — PREGABALIN 100 MG CAPSULE
ORAL_CAPSULE | Freq: Two times a day (BID) | ORAL | 3 refills | 14.00000 days | Status: CP
Start: 2024-07-01 — End: 2024-07-31

## 2024-07-01 MED ORDER — LEVOTHYROXINE 50 MCG TABLET
ORAL_TABLET | Freq: Every day | ORAL | 0 refills | 90.00000 days | Status: CP
Start: 2024-07-01 — End: 2024-09-29

## 2024-07-01 MED ORDER — DOCUSATE SODIUM 100 MG CAPSULE
ORAL_CAPSULE | Freq: Two times a day (BID) | ORAL | 0 refills | 14.00000 days | Status: CP
Start: 2024-07-01 — End: 2024-07-15

## 2024-07-01 MED ORDER — OXYCODONE 5 MG TABLET
ORAL_TABLET | ORAL | 0 refills | 5.00000 days | Status: CP | PRN
Start: 2024-07-01 — End: 2024-07-06
  Filled 2024-07-01: qty 30, 5d supply, fill #0

## 2024-07-01 MED ORDER — METHOCARBAMOL 500 MG TABLET
ORAL_TABLET | Freq: Four times a day (QID) | ORAL | 0 refills | 7.00000 days | Status: CP
Start: 2024-07-01 — End: 2024-07-08
  Filled 2024-07-01: qty 56, 7d supply, fill #0

## 2024-07-01 MED ORDER — QULIPTA 60 MG TABLET
ORAL_TABLET | Freq: Every day | ORAL | 2 refills | 0.00000 days
Start: 2024-07-01 — End: ?

## 2024-07-01 NOTE — Discharge Summary (Signed)
 ------------------------------------------------------------------------------- Attestation signed by Evelena Oneil Barter, MD at 07/21/24 1857 I saw and evaluated the patient, participating in the key portions of the service.  I reviewed the resident's note.  I agree with the resident's findings and plan. Oneil DELENA Evelena, MD, MS, MBE, MPH Director of Peripheral Nerve Surgery Minimally Invasive and Complex Spine Surgery Clinical Assistant Professor of Neurosurgery [facility] of Medicine  -------------------------------------------------------------------------------  NEUROSURGERY DISCHARGE SUMMARY  Identifying Information:  Sharon Rowland 1986/12/09 [id number]  Admit date: 06/28/2024  Discharge date: 07/01/2024   Discharge Service: Neurosurgery [facility])  Discharge Attending Physician: Oneil Barter Evelena, MD  Discharge to: Home  Discharge Diagnoses: Principal Problem:   Occipital neuralgia Active Problems:   Ehlers-Danlos syndrome (HHS-HCC)   Hospital Course:  Sharon Rowland is a 38 y.o. female with a history of occipital neuralgia, Ehlers-Danlos syndrome, chronic pain, mast cell activation syndrome, gastroparesis,  mixed connective tissue disease, psoriatic arthritis on Tremfya, who was referred to [facility] Neurosurgery for discussion of occipital neurectomy. She was seen in clinic and consented for surgical intervention after discussing the risks, benefits, and alternatives in full detail.  The patient was taken to the OR on 06/28/2024 for B occipital neurectomies.  She tolerated the procedure well, was extubated in the OR, and was taken to the PACU for further neuromonitoring and was then eventually transferred to the floor. She did well postoperatively. Her diet was slowly advanced and at the time of discharge she was tolerating a regular diet. The patient was able to void spontaneously, have her pain controlled with P.O. pain medication, and returned to the preoperative  ambulatory status.   She saw internal medicine on POD 2 for fatigue but she felt better without intervention. She will be discharged home on POD 3 in stable condition.   Her running sutures were noticed to be not tight on the day of DC but her skin edges were well approximated and healing. Because of her prior experience with adhesives she declined to have any additional adhesive or tape applied and she requested for us  to leave the sutures as is. Wound care counseling was provided at bedside to patient and spouse, and again in writing. We had recommended a figure-8 brace to support wound healing but again the patient declined to wear it.   Procedures:  06/28/24 Dr. Evelena Neurolysis of bilateral greater, lesser and third occipital nerves Bilateral greater, lesser and third occipital neurectomies  Implantation of bilateral greater occipital nerves into muscle  Discharge Day Services:  The patient was seen and evaluated by the Neurosurgery team on the day of discharge and deemed stable for discharge, including stable labs, vital signs, radiographic studies, and neurologic exam.  Discharge instructions were given and explained.  Questions were answered.  Physical Exam: General: No acute distress Cardiovascular: Hemodynamically stable Pulmonary: Breathing is unlabored Neurologic:  EOSp Ox3 FS Spontaneous and purposeful x4   Incision with absorbable sutures  _____________________________________________________________________________  Temp:  [36.4 C (97.5 F)-36.7 C (98.1 F)] 36.6 C (97.9 F) Pulse:  [82-108] 83 Resp:  [16-18] 16 BP: (89-123)/(63-79) 123/79 MAP (mmHg):  [74-92] 92 SpO2:  [97 %-100 %] 100 %   Condition at Discharge:  Stable _____________________________________________________________________________ Discharge Medications:   Your Medication List     STOP taking these medications    diazePAM 5 MG tablet Commonly known as: VALIUM   ondansetron  4 MG  disintegrating tablet Commonly known as: ZOFRAN -ODT       START taking these medications    methocarbamol 1,000 mg  Tab Take 1,000 mg by mouth four (4) times a day for 7 days.       CHANGE how you take these medications    levothyroxine  50 MCG tablet Commonly known as: SYNTHROID  Take 1 tablet (50 mcg total) by mouth in the morning. What changed: medication strength   oxyCODONE  5 MG immediate release tablet Commonly known as: ROXICODONE  Take 1 tablet (5 mg total) by mouth every four (4) hours as needed for up to 5 days. What changed:  how much to take reasons to take this       CONTINUE taking these medications    acetaminophen  500 MG tablet Commonly known as: TYLENOL  EXTRA STRENGTH Take 2 tablets (1,000 mg total) by mouth Three (3) times a day for 14 days.   albuterol  90 mcg/actuation inhaler Commonly known as: PROVENTIL  HFA;VENTOLIN  HFA Inhale 1 puff every six (6) hours as needed.   buPROPion  300 MG 24 hr tablet Commonly known as: Wellbutrin  XL Take 1 tablet (300 mg total) by mouth every morning.   calcium carbonate 300 mg (750 mg) Chew Chew 1 tablet (750 mg total) every hour as needed.   CHOLECALCIFEROL (VITAMIN D3) ORAL Take 1 tablet/capsule by mouth in the morning.   clobetasol 0.05 % ointment Commonly known as: TEMOVATE Apply twice a day to affected areas of body (red/raised/itchy) until clear/smooth. Avoid face and folds.   desonide 0.05 % ointment Commonly known as: DESOWEN Take as directed.   diclofenac  50 MG EC tablet Commonly known as: VOLTAREN  Take 1 tablet (50 mg total) by mouth in the morning.   docusate sodium  100 MG capsule Commonly known as: COLACE Take 1 capsule (100 mg total) by mouth two (2) times a day for 28 doses.   empty container Misc Use as directed to dispose of Cosentyx pens.   estradiol  0.1 mg/24 hr Commonly known as: CLIMARA  Place 1 patch on the skin Two (2) times a week.   FIBER GUMMIES 2 gram Chew Generic drug:  inulin Chew 5 gum in the morning.   methylphenidate  HCl 10 MG ER tablet Commonly known as: METADATE  ER Take 1 tablet (10 mg total) by mouth every morning.   nystatin 100,000 unit/gram cream Commonly known as: MYCOSTATIN Apply topically to itchy rash in skin folds two to three times daily until smooth or clear.   omeprazole  20 MG capsule Commonly known as: PriLOSEC Take 1 capsule (20 mg total) by mouth in the morning.   pregabalin  100 MG capsule Commonly known as: LYRICA  Take 1 capsule (100 mg total) by mouth two (2) times a day.   PREMARIN  0.625 mg/gram vaginal cream Generic drug: conjugated estrogens  Insert into the vagina in the morning.   promethazine  25 MG suppository Commonly known as: PHENERGAN  Insert 1 suppository (25 mg total) into the rectum.   QULIPTA 60 mg Tab Generic drug: atogepant   scopolamine  1 mg over 3 days Commonly known as: TRANSDERM-SCOP Place 1.5 mg on the skin.   WOMEN'S DAILY MULTIVITAMIN ORAL Take 1 tablet/capsule by mouth in the morning.       _____________________________________________________________________________ Pending Test Results (if blank, then none):   Most Recent Labs: Microbiology Results (last day)     ** No results found for the last 24 hours. **       Lab Results  Component Value Date   WBC 8.6 06/30/2024   HGB 11.6 06/30/2024   HCT 34.0 06/30/2024   PLT 206 06/30/2024    Lab Results  Component Value Date  NA 140 06/30/2024   K 3.4 06/30/2024   CL 105 06/30/2024   CO2 27.0 06/30/2024   BUN 6 (L) 06/30/2024   CREATININE 0.71 06/30/2024   CALCIUM 8.7 06/30/2024   MG 2.1 05/29/2024   PHOS 4.0 05/17/2023    _____________________________________________________________________________ Discharge Instructions:  Activity Instructions     Discharge activity     Activity: Your muscles may be stiff or sore. Walking and moving around will help with both pain and stiffness. You will likely experience more  fatigue for several days after surgery due to the medication used to put you to sleep. Try to remain active and awake during the day so that you are tired at bedtime and can get a full night's rest. Avoid strenuous exercise for the first 2 weeks. Walking is ok. Do not do any bending, twisting, lifting over 10 lbs until cleared by the Neurosurgeon.       Diet Instructions     Discharge Nutrition Therapy     Discharge Nutrition Therapy: Regular   Please continue a regular diet with no restrictions.          Other Instructions     Discharge call MD     Things to look out for: -Pain- please call the Neurosurgery resident on-call for any worsening pain unrelieved with prescribed medications. -Fever- If you have a fever over 101.5 F, call the Neurosurgery resident on-call. A low grade fever is normal for some people after surgery. -Redness, drainage, odor, bleeding- If your incision is red, draining, has a bad odor, or has increasing bleeding, contact the Neurosurgery office during business hours or the Neurosurgery resident on-call after hours. There may be some normal swelling and pink-coloring around the wound. There may also be some fluid under the skin. It can take several weeks to months to be reabsorbed. -Muscle strength- If an arm or leg becomes weak, call the Neurosurgery resident on -call immediately. -Bowel and Bladder- If you have new loss of bowel or bladder control, report straight to the emergency room. -Mental status- If you or your family notice a change in your mental state from your normal, go straight to the emergency room. A change in mental status includes: increased confusion, trouble waking up, blurry/double vision, severe uncontrolled headaches, vomiting, loss of control of one side of your body, seizures, or other concerning issues.  -Important Numbers: -Neurosurgery resident on-call 24/7: Call [phone] for the [facility] hospital operator. Ask to speak with the  Neurosurgery resident on-call.  During the weekdays: call Izetta Minus, RN [phone] or Almarie Dues [phone]   Discharge dressing     Wound care:  Examine the surgical site at least twice daily.  Do not use ointments, creams, gels, peroxide, or blow dryers on the wound. Please shower daily and use a mild soap and water  for neck/back/groin incisions to clean the wound. Do not scrub too vigorously. Do not submerge the wound under water  (swimming or tub soaking) until 2-4 weeks after surgery.  After showering, pat the wound dry and leave open to air.  If you have dermabond (surgical glue), do not pick at it as it will come off when ready.   Discharge instructions     Pain: You have been prescribed a narcotic pain medication. Do not drive or make critical/important decisions while taking this medication. Narcotic medications may cause constipation. Ensure you have adequate (>25 grams/day) of fiber in your diet and drink at least 64 oz. of water  daily. You may also wish to  take an over the counter stool softener once or twice daily as needed for constipation while taking narcotic pain medication.       Follow Up instructions and Outpatient Referrals    Discharge instructions       Appointments which have been scheduled for you    Jul 09, 2024 9:00 AM (Arrive by 8:45 AM) NEW ENT with Adam Swaziland Kimple, MD [facility] [address] [place] [facility] REGION) [address] [place] [address] [phone]     Jul 30, 2024 11:30 AM (Arrive by 11:15 AM) RETURN NEUROSURGERY with Oneil Prentice Pillar, MD [facility] NEUROSURGERY [address] [place] [facility] REGION) [address] [place] [address] [phone]     Aug 02, 2024 8:00 AM (Arrive by 7:35 AM) PROCEDURE [facility] with Mona Jock, FNP [facility] NEUROLOGY CLINIC [address] [place] [facility] REGION) [address] [place] [address] [phone]     Aug 02, 2024 3:00 PM (Arrive by 2:35 PM) RETURN NEUROLOGY with Mona Jock, FNP [facility] NEUROLOGY CLINIC [address] [place] [facility] REGION) [address] [place] [address] [phone]     Aug 17, 2024 11:45 AM (Arrive by 11:30 AM) RETURN ORTHOPAEDICS with Alease Carrie Oyster, MD [facility] MED CENTER [place] [facility] REGION) [address] [place] [address] [phone]     Sep 23, 2024 1:30 PM (Arrive by 1:15 PM) MRI Brachial Plexus Right With and Without Contrast with IC MRI MBL 1 IMG MRI IMAGING CENTER [facility]) [address] [place] [address] [phone]  On appointment date: Bring recent lab work Bring documentation of any metal object implants Take meds as usual Check w/physician if diabetic You will be asked to change into a gown for your safety  On appointment date do not: No restrictions on food/drink Wear metallic items including jewelry (we are not responsible for lost items)  Let us  know if patient: Claustrophobic Metal object implant Pregnant Prescribed a sedative On dialysis Allergic to MRI dye/contrast Kidney Failure    Sep 30, 2024 1:30 PM (Arrive by 1:15 PM) RETURN VASCULAR with Lawayne Patience, MD [facility] HEART VASCULAR CTR SURGERY [address] [place] [facility] REGION) [address] [place] [address] [phone]     Dec 10, 2024 1:40 PM (Arrive by 1:25 PM) NEW ADULT with Jama Debby Denver, MD [facility] PHYSICAL MEDICINE [facility] [address] [place] [facility] REGION) [address] [place] [address] [phone]            I spent 95 minutes in activities related to discharge for this patient

## 2024-07-02 ENCOUNTER — Telehealth: Payer: Self-pay | Admitting: *Deleted

## 2024-07-02 MED ORDER — QULIPTA 60 MG TABLET
ORAL_TABLET | Freq: Every day | ORAL | 0 refills | 30.00000 days | Status: CP
Start: 2024-07-02 — End: 2024-08-01

## 2024-07-02 MED ORDER — ESTRADIOL 0.1 MG/GM VA CREA: TOPICAL_CREAM | VAGINAL | 12 refills | Status: DC

## 2024-07-02 NOTE — Telephone Encounter (Signed)
 Pt's insurance has denied covering Premarin  vaginal cream. Pt needs to try Estradiol  oral tablets(0.5 mg, 1 mg, 2 mg), Estradiol  vaginal cream 0.1 mg/gram or Estradiol  Valerate IM oil( 20 mg/mL, 40 mg/mL). Left message @ 11:25 am, letting pt know. JSY

## 2024-07-02 NOTE — Telephone Encounter (Signed)
 Left message @ 12:34 pm, letting pt know Dr. Jayne sent in a prescription for Estrace  vaginal cream. JSY

## 2024-07-11 DIAGNOSIS — R1031 Right lower quadrant pain: Secondary | ICD-10-CM | POA: Insufficient documentation

## 2024-07-11 DIAGNOSIS — E039 Hypothyroidism, unspecified: Secondary | ICD-10-CM | POA: Insufficient documentation

## 2024-07-11 DIAGNOSIS — J45909 Unspecified asthma, uncomplicated: Secondary | ICD-10-CM | POA: Diagnosis not present

## 2024-07-11 DIAGNOSIS — R110 Nausea: Secondary | ICD-10-CM | POA: Insufficient documentation

## 2024-07-12 ENCOUNTER — Emergency Department (HOSPITAL_COMMUNITY)

## 2024-07-12 ENCOUNTER — Other Ambulatory Visit: Payer: Self-pay

## 2024-07-12 ENCOUNTER — Encounter (HOSPITAL_COMMUNITY): Payer: Self-pay | Admitting: Emergency Medicine

## 2024-07-12 ENCOUNTER — Ambulatory Visit: Admitting: Obstetrics & Gynecology

## 2024-07-12 ENCOUNTER — Emergency Department (HOSPITAL_COMMUNITY)
Admission: EM | Admit: 2024-07-12 | Discharge: 2024-07-12 | Disposition: A | Discharge status: Home / Self Care | Attending: Emergency Medicine | Admitting: Emergency Medicine

## 2024-07-12 DIAGNOSIS — R1031 Right lower quadrant pain: Secondary | ICD-10-CM

## 2024-07-12 LAB — COMPREHENSIVE METABOLIC PANEL WITH GFR
ALT: 16 U/L (ref 0–44)
AST: 10 U/L — ABNORMAL LOW (ref 15–41)
Albumin: 4.2 g/dL (ref 3.5–5.0)
Alkaline Phosphatase: 80 U/L (ref 38–126)
Anion gap: 11 (ref 5–15)
BUN: 6 mg/dL (ref 6–20)
CO2: 24 mmol/L (ref 22–32)
Calcium: 9.1 mg/dL (ref 8.9–10.3)
Chloride: 100 mmol/L (ref 98–111)
Creatinine, Ser: 0.74 mg/dL (ref 0.44–1.00)
GFR, Estimated: >60 mL/min (ref >60)
Glucose, Bld: 104 mg/dL — ABNORMAL HIGH (ref 70–99)
Potassium: 3.2 mmol/L — ABNORMAL LOW (ref 3.5–5.1)
Sodium: 135 mmol/L (ref 135–145)
Total Bilirubin: 0.5 mg/dL (ref 0.0–1.2)
Total Protein: 7.4 g/dL (ref 6.5–8.1)

## 2024-07-12 LAB — URINALYSIS, ROUTINE W REFLEX MICROSCOPIC
Bilirubin Urine: NEGATIVE
Glucose, UA: NEGATIVE mg/dL
Hgb urine dipstick: NEGATIVE
Ketones, ur: NEGATIVE mg/dL
Leukocytes,Ua: NEGATIVE
Nitrite: NEGATIVE
Protein, ur: NEGATIVE mg/dL
Specific Gravity, Urine: >1.046 — ABNORMAL HIGH (ref 1.005–1.030)
pH: 6 (ref 5.0–8.0)

## 2024-07-12 LAB — CBC
HCT: 38.7 % (ref 36.0–46.0)
Hemoglobin: 12.8 g/dL (ref 12.0–15.0)
MCH: 27.2 pg (ref 26.0–34.0)
MCHC: 33.1 g/dL (ref 30.0–36.0)
MCV: 82.3 fL (ref 80.0–100.0)
Platelets: 350 K/uL (ref 150–400)
RBC: 4.7 MIL/uL (ref 3.87–5.11)
RDW: 13.8 % (ref 11.5–15.5)
WBC: 9.8 K/uL (ref 4.0–10.5)
nRBC: 0 % (ref 0.0–0.2)

## 2024-07-12 LAB — LIPASE, BLOOD: Lipase: 34 U/L (ref 11–51)

## 2024-07-12 MED ORDER — IOHEXOL 300 MG/ML  SOLN
100.0000 mL | Freq: Once | INTRAMUSCULAR | Status: AC | PRN
  Administered 2024-07-12: 100 mL via INTRAVENOUS

## 2024-07-12 MED ORDER — MORPHINE SULFATE (PF) 2 MG/ML IV SOLN
2.0000 mg | Freq: Once | INTRAVENOUS | Status: AC
  Administered 2024-07-12: 2 mg via INTRAVENOUS
  Filled 2024-07-12: qty 1

## 2024-07-12 MED ORDER — DIPHENHYDRAMINE HCL 50 MG/ML IJ SOLN
50.0000 mg | Freq: Once | INTRAMUSCULAR | Status: AC
  Administered 2024-07-12: 50 mg via INTRAVENOUS
  Filled 2024-07-12: qty 1

## 2024-07-12 NOTE — ED Notes (Signed)
 Pt unable to give urine at this time.

## 2024-07-12 NOTE — Discharge Instructions (Signed)
 You were evaluated in the Emergency Department and after careful evaluation, we did not find any emergent condition requiring admission or further testing in the hospital.  Your exam/testing today is overall reassuring.  Recommend Tylenol  or Motrin  for any lingering discomfort, follow-up with your regular doctors.  Please return to the Emergency Department if you experience any worsening of your condition.   Thank you for allowing us  to be a part of your care.

## 2024-07-12 NOTE — ED Triage Notes (Signed)
 Pt c/o right lower quadrant pain that started today.

## 2024-07-12 NOTE — ED Notes (Signed)
 Patient transported to CT

## 2024-07-12 NOTE — ED Provider Notes (Signed)
 [facility]-EMERGENCY DEPT [facility] Emergency Department Provider Note MRN:  [id number]  Arrival date & time: 07/12/24     Chief Complaint   Abdominal Pain   History of Present Illness   Sharon Rowland is a 38 y.o. year-old female with a history of multiple rheumatologic conditions presenting to the ED with chief complaint of abdominal pain.  Right lower quadrant pain persistent over the past several hours.  Nausea but no vomiting, chills but no fever.  Has had numerous abdominal surgeries in the past.  Review of Systems  A thorough review of systems was obtained and all systems are negative except as noted in the HPI and PMH.   Patient's Health History    Past Medical History:  Diagnosis Date   Allergic rhinitis    Anemia    Arthritis    Asthma    last used inhaler 1 month ago   Complication of anesthesia    if given fentanyl  has severe itching, rash and wheezing   Cubital tunnel syndrome on left 07/2023   Depression    Dysphagia    Dysrhythmia    SVT   Ehlers-Danlos syndrome    Female bladder prolapse    Gastritis    GERD (gastroesophageal reflux disease)    with pregnancy only   Hashimoto's thyroiditis    Hypothyroidism    Lupus    Mast cell activation syndrome (HCC)    Migraines    Osteoarthritis    Pelvic pain in antepartum period in second trimester 01/17/2014   Pneumonia    POTS (postural orthostatic tachycardia syndrome)    Pregnant 08/01/2015   Psoriatic arthritis (HCC)    PSVT (paroxysmal supraventricular tachycardia) (HCC)    could not be confirmed with 2016 study   Rectocele    Sjogren's syndrome (HCC)    Thoracic outlet syndrome    Thyroid  disease    Urethral prolapse    Uterine prolapse    Vaginal Pap smear, abnormal     Past Surgical History:  Procedure Laterality Date   ANKLE SURGERY Bilateral    2023 L ankle debridement and internal brace 2024 R ankle debridement and internal brace   ANTERIOR AND POSTERIOR VAGINAL REPAIR      ANTERIOR INTEROSSEOUS NERVE DECOMPRESSION Left 08/13/2023   Procedure: SUBCUTANEOUS ANTERIOR TRANSPOSITION OF ULNAR NERVE AT LEFT ELBOW;  Surgeon: Edie Norleen PARAS, MD;  Location: [facility] ORS;  Service: Orthopedics;  Laterality: Left;   BREAST REDUCTION SURGERY Bilateral 08/20/2022   Procedure: MAMMARY REDUCTION  (BREAST) with Liposuction;  Surgeon: Marene Sieving, MD;  Location: [facility] OR;  Service: Plastics;  Laterality: Bilateral;  3 hours   COLONOSCOPY WITH ESOPHAGOGASTRODUODENOSCOPY (EGD)  12/26/2022   CYSTOCELE REPAIR N/A 09/17/2017   Procedure: ANTERIOR REPAIR (CYSTOCELE);  Surgeon: Jayne Vonn DEL, MD;  Location: [facility] ORS;  Service: Gynecology;  Laterality: N/A;   KNEE SURGERY Left 11/2021   KNEE SURGERY Right 2023   LABIOPLASTY N/A 03/24/2024   Procedure: LABIAPLASTY, VULVA;  Surgeon: Jayne Vonn DEL, MD;  Location: [facility];  Service: Gynecology;  Laterality: N/A;   LIPOSUCTION  03/2022   PANNICULECTOMY N/A 04/28/2023   Procedure: PANNICULECTOMY;  Surgeon: Waddell Leonce NOVAK, MD;  Location[facility] OR;  Service: Plastics;  Laterality: N/A;   SALPINGO-OOPHORECTOMY, BILATERAL, ROBOT ASSISTED, LAPAROSCOPIC Bilateral 03/24/2024   Procedure: SALPINGO-OOPHORECTOMY, BILATERAL, ROBOT ASSISTED, LAPAROSCOPIC, D5;  Surgeon: Jayne Vonn DEL, MD;  Location: [facility];  Service: Gynecology;  Laterality: Bilateral;   SUPRAVENTRICULAR TACHYCARDIA ABLATION  01/05/2015   unsuccessful  SUPRAVENTRICULAR TACHYCARDIA ABLATION N/A 01/05/2015   Procedure: SUPRAVENTRICULAR TACHYCARDIA ABLATION;  Surgeon: Danelle LELON Birmingham, MD;  Location: [facility] CATH LAB;  Service: Cardiovascular;  Laterality: N/A;   Thoracic outlet decompression  2024   VAGINAL HYSTERECTOMY N/A 09/17/2017   Procedure: HYSTERECTOMY VAGINAL WITH POSSIBLE BILATERAL SALPINGECTOMY;  Surgeon: Jayne Vonn DEL, MD;  Location: [facility];  Service: Gynecology;  Laterality: N/A;   WRIST GANGLION EXCISION Left    x2    Family History  Problem Relation Age of Onset   Hypertension Mother     Hyperlipidemia Mother    Multiple sclerosis Mother    COPD Mother    Cancer Mother        melanoma x 2   Diabetes Mother    Heart disease Father        PSVT   Hyperlipidemia Brother    Hypertension Brother    Heart disease Maternal Grandfather        heart attack   Cancer Paternal Grandmother        pancreatic cancer   Heart disease Paternal Grandmother        MI   Hyperlipidemia Paternal Grandmother    Hypertension Paternal Grandmother    Asthma Daughter    Asthma Son    Autism Son    ADD / ADHD Son    Tourette syndrome Son    Other Son        Neurological and mental issues   ADD / ADHD Son     Social History   Socioeconomic History   Marital status: Married    Spouse name: Toribio   Number of children: 6   Years of education: HS   Highest education level: Not on file  Occupational History   Occupation: Web designer  Tobacco Use   Smoking status: Never    Passive exposure: Never   Smokeless tobacco: Never  Vaping Use   Vaping status: Never Used  Substance and Sexual Activity   Alcohol use: No   Drug use: No   Sexual activity: Not Currently    Birth control/protection: Surgical    Comment: hyst  Other Topics Concern   Not on file  Social History Narrative   Lives with husband   Right handed    Caffeine  use: 1-2 diet cokes per day   Social Drivers of Health   Financial Resource Strain: Low Risk  (06/28/2024)   Received [facility]   Overall Financial Resource Strain (CARDIA)    How hard is it for you to pay for the very basics like food, housing, medical care, and heating?: Not hard at all  Food Insecurity: No Food Insecurity (06/28/2024)   Received from [facility]   Hunger Vital Sign    Within the past 12 months, you worried that your food would run out before you got the money to buy more.: Never true    Within the past 12 months, the food you bought just didn't last and you didn't have money to get more.: Never true   Transportation Needs: No Transportation Needs (06/28/2024)   Received from [facility]   PRAPARE - Transportation    Lack of Transportation (Medical): No    Lack of Transportation (Non-Medical): No  Physical Activity: Inactive (09/11/2021)   Received from [facility]   Exercise Vital Sign    On average, how many days per week do you engage in moderate to strenuous exercise (like a brisk walk)?: 0 days  On average, how many minutes do you engage in exercise at this level?: 0 min  Stress: Stress Concern Present (09/11/2021)   Received from [facility] Occupational [facility] - Occupational Stress Questionnaire  Social Connections: Moderately Isolated (09/11/2021)   Received from [facility]   Social Connection and Isolation Panel    In a typical week, how many times do you talk on the phone with family, friends, or neighbors?: Three times a week    How often do you get together with friends or relatives?: Never    How often do you attend church or religious services?: Never    Do you belong to any clubs or organizations such as church groups, unions, fraternal or athletic groups, or school groups?: No    How often do you attend meetings of the clubs or organizations you belong to?: Never    Are you married, widowed, divorced, separated, never married, or living with a partner?: Married  Intimate Partner Violence: Not At Risk (05/26/2024)   Received from [facility]   Humiliation, Afraid, Rape, and Kick questionnaire    Within the last year, have you been afraid of your partner or ex-partner?: No    Within the last year, have you been humiliated or emotionally abused in other ways by your partner or ex-partner?: No    Within the last year, have you been kicked, hit, slapped, or otherwise physically hurt by your partner or ex-partner?: No    Within the last year, have you been raped or forced to have any kind of sexual activity by your partner or  ex-partner?: No     Physical Exam   Vitals:   07/12/24 0215 07/12/24 0230  BP: 120/80 120/85  Pulse: (!) 105 (!) 102  Resp:  18  Temp:    SpO2: 99% 97%    CONSTITUTIONAL: Well-appearing, NAD NEURO/PSYCH:  Alert and oriented x 3, no focal deficits EYES:  eyes equal and reactive ENT/NECK:  no LAD, no JVD CARDIO: Regular rate, well-perfused, normal S1 and S2 PULM:  CTAB no wheezing or rhonchi GI/GU:  non-distended, focal mild right lower quadrant tenderness to palpation MSK/SPINE:  No gross deformities, no edema SKIN:  no rash, atraumatic   *Additional and/or pertinent findings included in MDM below  Diagnostic and Interventional Summary    EKG Interpretation Date/Time:    Ventricular Rate:    PR Interval:    QRS Duration:    QT Interval:    QTC Calculation:   R Axis:      Text Interpretation:         Labs Reviewed  COMPREHENSIVE METABOLIC PANEL WITH GFR - Abnormal; Notable for the following components:      Result Value   Potassium 3.2 (*)    Glucose, Bld 104 (*)    AST 10 (*)    All other components within normal limits  URINALYSIS, ROUTINE W REFLEX MICROSCOPIC - Abnormal; Notable for the following components:   Color, Urine STRAW (*)    Specific Gravity, Urine >1.046 (*)    All other components within normal limits  LIPASE, BLOOD  CBC    CT ABDOMEN PELVIS W CONTRAST  Final Result      Medications  morphine  (PF) 2 MG/ML injection 2 mg (2 mg Intravenous Given 07/12/24 0208)  diphenhydrAMINE  (BENADRYL ) injection 50 mg (50 mg Intravenous Given 07/12/24 0207)  iohexol  (OMNIPAQUE ) 300 MG/ML solution 100 mL (100 mLs Intravenous Contrast Given 07/12/24 0140)  Procedures  /  Critical Care Procedures  ED Course and Medical Decision Making  Initial Impression and Ddx Differential diagnosis includes appendicitis, SBO, right-sided diverticulitis, kidney stone, UTI.  Past medical/surgical history that increases complexity of ED encounter: History of multiple  abdominal surgeries  Interpretation of Diagnostics I personally reviewed the Laboratory Testing and my interpretation is as follows: No significant blood count or electrolyte disturbance.  CT unremarkable  Patient Reassessment and Ultimate Disposition/Management     Patient feeling better, no indication for further testing or admission, appropriate for discharge.  Patient management required discussion with the following services or consulting groups:  None  Complexity of Problems Addressed Acute illness or injury that poses threat of life of bodily function  Additional Data Reviewed and Analyzed Further history obtained from: Further history from spouse/family member  Additional Factors Impacting ED Encounter Risk Use of parenteral controlled substances and Consideration of hospitalization  Ozell HERO. Theadore, MD [facility] Emergency Medicine [facility] [email or web address]  Final Clinical Impressions(s) / ED Diagnoses     ICD-10-CM   1. Right lower quadrant abdominal pain  R10.31       ED Discharge Orders     None        Discharge Instructions Discussed with and Provided to Patient:     Discharge Instructions      You were evaluated in the Emergency Department and after careful evaluation, we did not find any emergent condition requiring admission or further testing in the hospital.  Your exam/testing today is overall reassuring.  Recommend Tylenol  or Motrin  for any lingering discomfort, follow-up with your regular doctors.  Please return to the Emergency Department if you experience any worsening of your condition.   Thank you for allowing us  to be a part of your care.       Theadore Ozell HERO, MD 07/12/24 [phone]

## 2024-08-02 ENCOUNTER — Ambulatory Visit: Admit: 2024-08-02 | Discharge: 2024-08-02 | Payer: BLUE CROSS/BLUE SHIELD

## 2024-08-02 DIAGNOSIS — G43119 Migraine with aura, intractable, without status migrainosus: Principal | ICD-10-CM

## 2024-08-02 MED ORDER — QULIPTA 60 MG TABLET
ORAL_TABLET | Freq: Every day | ORAL | 3 refills | 0.00000 days | Status: CP
Start: 2024-08-02 — End: 2025-07-28

## 2024-08-25 ENCOUNTER — Other Ambulatory Visit: Payer: Self-pay | Admitting: Obstetrics & Gynecology

## 2024-08-30 DIAGNOSIS — G43119 Migraine with aura, intractable, without status migrainosus: Principal | ICD-10-CM

## 2024-09-11 ENCOUNTER — Encounter (HOSPITAL_COMMUNITY): Payer: Self-pay

## 2024-09-11 ENCOUNTER — Emergency Department (HOSPITAL_COMMUNITY)

## 2024-09-11 ENCOUNTER — Other Ambulatory Visit: Payer: Self-pay

## 2024-09-11 ENCOUNTER — Emergency Department (HOSPITAL_COMMUNITY)
Admission: EM | Admit: 2024-09-11 | Discharge: 2024-09-11 | Disposition: A | Discharge status: Home / Self Care | Attending: Emergency Medicine | Admitting: Emergency Medicine

## 2024-09-11 DIAGNOSIS — Z91040 Latex allergy status: Secondary | ICD-10-CM | POA: Insufficient documentation

## 2024-09-11 DIAGNOSIS — R1031 Right lower quadrant pain: Secondary | ICD-10-CM | POA: Insufficient documentation

## 2024-09-11 DIAGNOSIS — R000 Tachycardia, unspecified: Secondary | ICD-10-CM | POA: Insufficient documentation

## 2024-09-11 DIAGNOSIS — R109 Unspecified abdominal pain: Secondary | ICD-10-CM

## 2024-09-11 DIAGNOSIS — R300 Dysuria: Secondary | ICD-10-CM | POA: Insufficient documentation

## 2024-09-11 DIAGNOSIS — R509 Fever, unspecified: Secondary | ICD-10-CM | POA: Insufficient documentation

## 2024-09-11 LAB — URINALYSIS, ROUTINE W REFLEX MICROSCOPIC
Bilirubin Urine: NEGATIVE
Glucose, UA: NEGATIVE mg/dL
Hgb urine dipstick: NEGATIVE
Ketones, ur: NEGATIVE mg/dL
Leukocytes,Ua: NEGATIVE
Nitrite: NEGATIVE
Protein, ur: NEGATIVE mg/dL
Specific Gravity, Urine: 1.002 — ABNORMAL LOW (ref 1.005–1.030)
pH: 7 (ref 5.0–8.0)

## 2024-09-11 LAB — BASIC METABOLIC PANEL WITH GFR
Anion gap: 13 (ref 5–15)
BUN: 8 mg/dL (ref 6–20)
CO2: 23 mmol/L (ref 22–32)
Calcium: 9.2 mg/dL (ref 8.9–10.3)
Chloride: 101 mmol/L (ref 98–111)
Creatinine, Ser: 0.81 mg/dL (ref 0.44–1.00)
GFR, Estimated: >60 mL/min (ref >60)
Glucose, Bld: 95 mg/dL (ref 70–99)
Potassium: 3.5 mmol/L (ref 3.5–5.1)
Sodium: 137 mmol/L (ref 135–145)

## 2024-09-11 LAB — CBC
HCT: 39.5 % (ref 36.0–46.0)
Hemoglobin: 13.2 g/dL (ref 12.0–15.0)
MCH: 27.3 pg (ref 26.0–34.0)
MCHC: 33.4 g/dL (ref 30.0–36.0)
MCV: 81.8 fL (ref 80.0–100.0)
Platelets: 292 K/uL (ref 150–400)
RBC: 4.83 MIL/uL (ref 3.87–5.11)
RDW: 13.5 % (ref 11.5–15.5)
WBC: 7 K/uL (ref 4.0–10.5)
nRBC: 0 % (ref 0.0–0.2)

## 2024-09-11 LAB — LACTIC ACID, PLASMA: Lactic Acid, Venous: 1.2 mmol/L (ref 0.5–1.9)

## 2024-09-11 MED ORDER — CEPHALEXIN 500 MG PO CAPS: 500.0000 mg | ORAL_CAPSULE | Freq: Three times a day (TID) | ORAL | 0 refills | Status: DC

## 2024-09-11 MED ORDER — IOHEXOL 300 MG/ML  SOLN
100.0000 mL | Freq: Once | INTRAMUSCULAR | Status: AC | PRN
  Administered 2024-09-11: 100 mL via INTRAVENOUS

## 2024-09-11 MED ORDER — SODIUM CHLORIDE 0.9 % IV SOLN
1.0000 g | Freq: Once | INTRAVENOUS | Status: AC
  Administered 2024-09-11: 1 g via INTRAVENOUS
  Filled 2024-09-11: qty 10

## 2024-09-11 MED ORDER — SODIUM CHLORIDE 0.9 % IV BOLUS
1000.0000 mL | Freq: Once | INTRAVENOUS | Status: AC
  Administered 2024-09-11: 1000 mL via INTRAVENOUS

## 2024-09-11 NOTE — ED Triage Notes (Signed)
 Pt is immunocompromised and has been fighting a UTI for 2 months and has had multiple rounds of antibiotics but pt stated that she believes that the infection has moved into her right kidney. Pt stated that she if having flank pain and urine retention

## 2024-09-11 NOTE — ED Provider Notes (Signed)
  EMERGENCY DEPARTMENT AT [facility] Provider Note   CSN: [id number] Arrival date & time: 09/11/24  2104     Patient presents with: Flank Pain   Sharon Rowland is a 38 y.o. female.  She has multiple chronic medical conditions and is on an immune suppressant.  Has to self cath.  Gets frequent UTIs.  Has been on multiple courses of antibiotics over the last few months but does not feel like she ever clears an infection.  Most recently was on Cipro  2 weeks ago.  Follows with Dr. Jayne.  Complaining of few days of worsening right flank right lower quadrant burning pain and dysuria.  Low-grade fevers.  No vaginal bleeding or discharge.  Has had a prior hysterectomy.  {Add pertinent medical, surgical, social history, OB history to YEP:67052} The history is provided by the patient.  Flank Pain This is a new problem. The problem occurs constantly. The problem has been gradually worsening. Associated symptoms include abdominal pain. Pertinent negatives include no chest pain, no headaches and no shortness of breath. Nothing relieves the symptoms. Treatments tried: fluids. The treatment provided no relief.       Prior to Admission medications   Medication Sig Start Date End Date Taking? Authorizing Provider  albuterol  (VENTOLIN  HFA) 108 (90 Base) MCG/ACT inhaler Inhale 1-2 puffs into the lungs every 6 (six) hours as needed for wheezing or shortness of breath.    [provider]  Atogepant (QULIPTA) 60 MG TABS Take 60 mg by mouth daily.    [provider]  betamethasone dipropionate 0.05 % cream Apply 1 Application topically 2 (two) times daily as needed (psoriasis).    [provider]  botulinum toxin Type A (BOTOX) 200 units injection Inject 200 Units as directed every 3 (three) months.    [provider]  buPROPion  (WELLBUTRIN  XL) 300 MG 24 hr tablet Take 1 tablet by mouth daily. 09/05/23   Jayne Vonn DEL, MD  calcium carbonate (TUMS EX) 750  MG chewable tablet Chew 1 tablet by mouth as needed for heartburn.    [provider]  cholecalciferol (VITAMIN D3) 25 MCG (1000 UNIT) tablet Take 1,000 Units by mouth daily.    [provider]  ciprofloxacin  (CIPRO ) 250 MG tablet Take 500 mg by mouth 2 (two) times daily. 05/16/24   [provider]  clobetasol cream (TEMOVATE) 0.05 % Apply 1 Application topically daily as needed (psoriasis).    [provider]  desonide (DESOWEN) 0.05 % ointment Apply 1 application  topically 2 (two) times daily as needed (psoriasis). 04/24/20   [provider]  diclofenac  (VOLTAREN ) 50 MG EC tablet Take 2 tablets by mouth twice daily. 09/05/23   Jayne Vonn DEL, MD  docusate sodium  (COLACE) 100 MG capsule Take 1 capsule (100 mg total) by mouth 2 (two) times daily. 01/19/24 01/18/25  Pappayliou, Dorothyann A, DO  estradiol  (ESTRACE ) 0.1 MG/GM vaginal cream 1 gram at bedtime 07/02/24   Jayne Vonn DEL, MD  estradiol  (VIVELLE -DOT) 0.1 MG/24HR patch Place 1 patch (0.1 mg total) onto the skin 2 (two) times a week. 05/06/24   Jayne Vonn DEL, MD  FIBER PO Take 20 mg by mouth daily.    [provider]  Guselkumab (TREMFYA) 100 MG/ML SOPN Inject 1 Dose into the skin every 30 (thirty) days. Patient not taking: Reported on 04/02/2024    [provider]  levothyroxine  (SYNTHROID ) 88 MCG tablet Take 88 mcg by mouth daily before breakfast. 10/28/22  [provider]  methylphenidate  10 MG ER tablet TAKE ONE TABLET BY MOUTH IN THE MORNING 04/18/23   Jayne Vonn DEL, MD  Multiple Vitamin (MULTIVITAMIN WITH MINERALS) TABS tablet Take 1 tablet by mouth daily.    [provider]  nystatin cream (MYCOSTATIN) Apply 1 Application topically 2 (two) times daily as needed for dry skin (yeast).    [provider]  omeprazole  (PRILOSEC) 40 MG capsule Take 1 capsule by mouth daily. 08/25/24   Jayne Vonn DEL, MD  pregabalin  (LYRICA ) 100 MG capsule Take 1 capsule (100 mg  total) by mouth 2 (two) times daily. Patient taking differently: Take 100 mg by mouth daily. 10/23/22   Jayne Vonn DEL, MD  PREMARIN  vaginal cream Insert 1 gram vaginally nightly. 05/02/24   Jayne Vonn DEL, MD  promethazine  (PHENERGAN ) 25 MG suppository Place 1 suppository (25 mg total) rectally every 6 (six) hours as needed for nausea or vomiting. 05/23/24   Daralene Lonni BIRCH, PA-C  promethazine  (PHENERGAN ) 25 MG tablet Take 1 tablet (25 mg total) by mouth every 6 (six) hours as needed for nausea or vomiting. 05/23/24   Daralene Lonni BIRCH, PA-C  scopolamine  (TRANSDERM-SCOP) 1 MG/3DAYS Place 1 patch (1.5 mg total) onto the skin every 3 (three) days. 01/25/24   Cleotilde Rogue, MD    Allergies: Apremilast, Cosentyx [secukinumab], Fentanyl , Hydroxychloroquine, Metoclopramide , Other, Prochlorperazine , Semaglutide, Silver , Tape, Latex, Zofran  [ondansetron  hcl], and Ezetimibe    Review of Systems  Constitutional:  Positive for fatigue and fever.  Respiratory:  Negative for shortness of breath.   Cardiovascular:  Negative for chest pain.  Gastrointestinal:  Positive for abdominal pain.  Genitourinary:  Positive for difficulty urinating, dysuria and flank pain.  Neurological:  Negative for headaches.    Updated Vital Signs BP (!) 138/98 (BP Location: Right Arm)   Pulse (!) 126   Temp 99.1 F (37.3 C) (Oral)   Resp 18   Ht 5' 3 (1.6 m)   Wt 63.5 kg   LMP 10/23/2016 (Approximate)   SpO2 100%   BMI 24.80 kg/m   Physical Exam Vitals and nursing note reviewed.  Constitutional:      General: She is not in acute distress.    Appearance: Normal appearance. She is well-developed.  HENT:     Head: Normocephalic and atraumatic.  Eyes:     Conjunctiva/sclera: Conjunctivae normal.  Cardiovascular:     Rate and Rhythm: Regular rhythm. Tachycardia present.     Heart sounds: No murmur heard. Pulmonary:     Effort: Pulmonary effort is normal. No respiratory distress.     Breath sounds: Normal  breath sounds. No stridor. No wheezing.  Abdominal:     Palpations: Abdomen is soft.     Tenderness: There is abdominal tenderness (lower abd). There is no guarding or rebound.  Musculoskeletal:        General: No deformity.     Cervical back: Neck supple.  Skin:    General: Skin is warm and dry.  Neurological:     General: No focal deficit present.     Mental Status: She is alert.     GCS: GCS eye subscore is 4. GCS verbal subscore is 5. GCS motor subscore is 6.     Gait: Gait normal.     (all labs ordered are listed, but only abnormal results are displayed) Labs Reviewed  CULTURE, BLOOD (ROUTINE X 2)  CULTURE, BLOOD (ROUTINE X 2)  URINALYSIS, ROUTINE W REFLEX MICROSCOPIC  BASIC METABOLIC PANEL WITH GFR  CBC  LACTIC ACID, PLASMA  LACTIC ACID, PLASMA    EKG: None  Radiology: No results found.  {Document cardiac monitor, telemetry assessment procedure when appropriate:32947} Procedures   Medications Ordered in the ED - No data to display    {Click here for ABCD2, HEART and other calculators REFRESH Note before signing:1}                              Medical Decision Making Amount and/or Complexity of Data Reviewed Labs: ordered.   This patient complains of ***; this involves an extensive number of treatment Options and is a complaint that carries with it a high risk of complications and morbidity. The differential includes ***  I ordered, reviewed and interpreted labs, which included *** I ordered medication *** and reviewed PMP when indicated. I ordered imaging studies which included *** and I independently    visualized and interpreted imaging which showed *** Additional history obtained from *** Previous records obtained and reviewed *** I consulted *** and discussed lab and imaging findings and discussed disposition.  Cardiac monitoring reviewed, *** Social determinants considered, *** Critical Interventions: ***  After the interventions stated above,  I reevaluated the patient and found *** Admission and further testing considered, ***   {Document critical care time when appropriate  Document review of labs and clinical decision tools ie CHADS2VASC2, etc  Document your independent review of radiology images and any outside records  Document your discussion with family members, caretakers and with consultants  Document social determinants of health affecting pt's care  Document your decision making why or why not admission, treatments were needed:32947:::1}   Final diagnoses:  None    ED Discharge Orders     None

## 2024-09-11 NOTE — Discharge Instructions (Signed)
 You are seen in the emergency department for urinary symptoms and flank pain.  You had blood work urinalysis blood cultures urine culture and a CAT scan of your abdomen and pelvis that did not show an obvious explanation for your symptoms.  You received an IV dose of antibiotics and reported you are on oral antibiotics until your cultures result.  Your doctor may need to adjust your medications depending on culture results.  Please continue to stay well-hydrated.  If any worsening or concerning symptoms

## 2024-09-14 LAB — URINE CULTURE: Culture: 30000 — AB

## 2024-09-15 ENCOUNTER — Telehealth (HOSPITAL_BASED_OUTPATIENT_CLINIC_OR_DEPARTMENT_OTHER): Payer: Self-pay

## 2024-09-15 NOTE — Telephone Encounter (Signed)
 Post ED Visit - Positive Culture Follow-up  Culture report reviewed by antimicrobial stewardship pharmacist: [facility] [x]  Koren Or, Pharm.D. []  Venetia Gully, Pharm.D., BCPS AQ-ID []  Garrel Crews, Pharm.D., BCPS []  Almarie Lunger, [address].D., BCPS []  [place], [address].D., BCPS, AAHIVP []  Rosaline Bihari, Pharm.D., BCPS, AAHIVP []  Vernell Meier, PharmD, BCPS []  Latanya Hint, PharmD, BCPS []  Donald Medley, PharmD, BCPS []  Rocky Bold, PharmD []  Dorothyann Alert, PharmD, BCPS []  Morene Babe, PharmD  [facility] []  Rosaline Edison, PharmD []  Romona Bliss, PharmD []  Dolphus Roller, PharmD []  Veva Seip, Rph []  Vernell Daunt) Leonce, PharmD []  Eva Allis, PharmD []  Rosaline Millet, PharmD []  Iantha Batch, PharmD []  Arvin Gauss, PharmD []  Wanda Hasting, PharmD []  Ronal Rav, PharmD []  Rocky Slade, PharmD []  Bard Jeans, PharmD   Positive urine culture Treated with Cephalexin , organism sensitive to the same and no further patient follow-up is required at this time.  Ruth Camelia Elbe 09/15/2024, 11:46 AM

## 2024-09-16 LAB — CULTURE, BLOOD (ROUTINE X 2)
Culture: NO GROWTH
Culture: NO GROWTH
Special Requests: ADEQUATE
Special Requests: ADEQUATE

## 2024-09-24 ENCOUNTER — Encounter (INDEPENDENT_AMBULATORY_CARE_PROVIDER_SITE_OTHER): Payer: Self-pay

## 2024-09-29 ENCOUNTER — Ambulatory Visit: Admit: 2024-09-29 | Discharge: 2024-09-30 | Payer: BLUE CROSS/BLUE SHIELD

## 2024-09-29 DIAGNOSIS — G509 Disorder of trigeminal nerve, unspecified: Principal | ICD-10-CM

## 2024-10-06 ENCOUNTER — Emergency Department
Admit: 2024-10-06 | Discharge: 2024-10-06 | Disposition: A | Discharge status: Home / Self Care | Payer: BLUE CROSS/BLUE SHIELD

## 2024-10-06 DIAGNOSIS — N390 Urinary tract infection, site not specified: Principal | ICD-10-CM

## 2024-10-06 DIAGNOSIS — R10A1 Right flank pain: Principal | ICD-10-CM

## 2024-10-06 DIAGNOSIS — R110 Nausea: Principal | ICD-10-CM

## 2024-10-06 DIAGNOSIS — N12 Tubulo-interstitial nephritis, not specified as acute or chronic: Principal | ICD-10-CM

## 2024-10-06 DIAGNOSIS — R319 Hematuria, unspecified: Principal | ICD-10-CM

## 2024-10-06 MED ORDER — CIPROFLOXACIN 500 MG TABLET
ORAL_TABLET | Freq: Two times a day (BID) | ORAL | 0 refills | 7.00000 days | Status: CP
Start: 2024-10-06 — End: 2024-10-13

## 2024-10-06 MED ORDER — PROMETHAZINE 12.5 MG TABLET
ORAL_TABLET | Freq: Four times a day (QID) | ORAL | 0 refills | 7.00000 days | Status: CP | PRN
Start: 2024-10-06 — End: 2024-10-13

## 2024-10-08 ENCOUNTER — Other Ambulatory Visit: Payer: Self-pay | Admitting: *Deleted

## 2024-10-08 DIAGNOSIS — R1909 Other intra-abdominal and pelvic swelling, mass and lump: Secondary | ICD-10-CM

## 2024-10-12 ENCOUNTER — Encounter: Payer: Self-pay | Admitting: Surgery

## 2024-10-12 ENCOUNTER — Ambulatory Visit (INDEPENDENT_AMBULATORY_CARE_PROVIDER_SITE_OTHER): Admitting: Surgery

## 2024-10-12 VITALS — BP 116/79 | HR 90 | Temp 98.3°F | Resp 12 | Ht 63.0 in | Wt 157.0 lb

## 2024-10-12 DIAGNOSIS — K429 Umbilical hernia without obstruction or gangrene: Secondary | ICD-10-CM

## 2024-10-12 NOTE — Progress Notes (Unsigned)
 [facility] History and Physical  Reason for Referral:*** Referring Physician: ***  Chief Complaint   Follow-up     Sharon Rowland is a 38 y.o. female.  HPI: ***.  The *** started *** and has had a duration of ***.  It is associated with ***.  The *** is improved with ***, and is made worse with ***.    Quality*** Context***  Past Medical History:  Diagnosis Date   Allergic rhinitis    Anemia    Arthritis    Asthma    last used inhaler 1 month ago   Complication of anesthesia    if given fentanyl  has severe itching, rash and wheezing   Cubital tunnel syndrome on left 07/2023   Depression    Dysphagia    Dysrhythmia    SVT   Ehlers-Danlos syndrome    Female bladder prolapse    Gastritis    GERD (gastroesophageal reflux disease)    with pregnancy only   Hashimoto's thyroiditis    Hypothyroidism    Lupus    Mast cell activation syndrome    Migraines    Osteoarthritis    Pelvic pain in antepartum period in second trimester 01/17/2014   Pneumonia    POTS (postural orthostatic tachycardia syndrome)    Pregnant 08/01/2015   Psoriatic arthritis (HCC)    PSVT (paroxysmal supraventricular tachycardia)    could not be confirmed with 2016 study   Rectocele    Sjogren's syndrome    Thoracic outlet syndrome    Thyroid  disease    Urethral prolapse    Uterine prolapse    Vaginal Pap smear, abnormal     Past Surgical History:  Procedure Laterality Date   ANKLE SURGERY Bilateral    2023 L ankle debridement and internal brace 2024 R ankle debridement and internal brace   ANTERIOR AND POSTERIOR VAGINAL REPAIR     ANTERIOR INTEROSSEOUS NERVE DECOMPRESSION Left 08/13/2023   Procedure: SUBCUTANEOUS ANTERIOR TRANSPOSITION OF ULNAR NERVE AT LEFT ELBOW;  Surgeon: Edie Norleen PARAS, MD;  Location: [facility] ORS;  Service: Orthopedics;  Laterality: Left;   BREAST REDUCTION SURGERY Bilateral 08/20/2022   Procedure: MAMMARY REDUCTION  (BREAST) with Liposuction;   Surgeon: Marene Sieving, MD;  Location: [facility] OR;  Service: Plastics;  Laterality: Bilateral;  3 hours   COLONOSCOPY WITH ESOPHAGOGASTRODUODENOSCOPY (EGD)  12/26/2022   CYSTOCELE REPAIR N/A 09/17/2017   Procedure: ANTERIOR REPAIR (CYSTOCELE);  Surgeon: Jayne Vonn DEL, MD;  Location: [facility];  Service: Gynecology;  Laterality: N/A;   KNEE SURGERY Left 11/2021   KNEE SURGERY Right 2023   LABIOPLASTY N/A 03/24/2024   Procedure: LABIAPLASTY, VULVA;  Surgeon: Jayne Vonn DEL, MD;  Location: [facility];  Service: Gynecology;  Laterality: N/A;   LIPOSUCTION  03/2022   PANNICULECTOMY N/A 04/28/2023   Procedure: PANNICULECTOMY;  Surgeon: Waddell Leonce NOVAK, MD;  Location: [facility] OR;  Service: Plastics;  Laterality: N/A;   SALPINGO-OOPHORECTOMY, BILATERAL, ROBOT ASSISTED, LAPAROSCOPIC Bilateral 03/24/2024   Procedure: SALPINGO-OOPHORECTOMY, BILATERAL, ROBOT ASSISTED, LAPAROSCOPIC, D5;  Surgeon: Jayne Vonn DEL, MD;  Location: [facility];  Service: Gynecology;  Laterality: Bilateral;   SUPRAVENTRICULAR TACHYCARDIA ABLATION  01/05/2015   unsuccessful   SUPRAVENTRICULAR TACHYCARDIA ABLATION N/A 01/05/2015   Procedure: SUPRAVENTRICULAR TACHYCARDIA ABLATION;  Surgeon: Danelle LELON Waddell, MD;  Location: [facility] CATH LAB;  Service: Cardiovascular;  Laterality: N/A;   Thoracic outlet decompression  2024   VAGINAL HYSTERECTOMY N/A 09/17/2017   Procedure: HYSTERECTOMY VAGINAL WITH POSSIBLE BILATERAL SALPINGECTOMY;  Surgeon: Jayne Vonn DEL,  MD;  Location: [facility];  Service: Gynecology;  Laterality: N/A;   WRIST GANGLION EXCISION Left    x2    Family History  Problem Relation Age of Onset   Hypertension Mother    Hyperlipidemia Mother    Multiple sclerosis Mother    COPD Mother    Cancer Mother        melanoma x 2   Diabetes Mother    Heart disease Father        PSVT   Hyperlipidemia Brother    Hypertension Brother    Heart disease Maternal Grandfather        heart attack   Cancer Paternal Grandmother        pancreatic cancer    Heart disease Paternal Grandmother        MI   Hyperlipidemia Paternal Grandmother    Hypertension Paternal Grandmother    Asthma Daughter    Asthma Son    Autism Son    ADD / ADHD Son    Tourette syndrome Son    Other Son        Neurological and mental issues   ADD / ADHD Son     Social History   Tobacco Use   Smoking status: Never    Passive exposure: Never   Smokeless tobacco: Never  Vaping Use   Vaping status: Never Used  Substance Use Topics   Alcohol use: No   Drug use: No    Medications: {medication reviewed/display:3041432} Allergies as of 10/12/2024       Reactions   Apremilast Dermatitis, Rash, Other (See Comments)   Mood changes Hallucination DMAR  Pt stated this med makes her depression worse, makes her angry.   Cosentyx [secukinumab] Anaphylaxis   Fentanyl  Itching, Dermatitis, Rash, Other (See Comments)   Wheezing    Hydroxychloroquine Palpitations, Other (See Comments)   Prolonged QT Irregular heart rate   Metoclopramide  Other (See Comments)   DO NOT GIVE VIA IV (can take PO) Causes Tremors, Jerking, Tachycardia   Other Rash   Lidocaine  Patches Adhesive Burn Scar Skin    Prochlorperazine  Other (See Comments)   DO NOT GIVE VIA IV  (can take PO) Causes Tremors, Jerking, Tachycardia   Semaglutide Dermatitis   Silver  Hives, Dermatitis, Rash   Tape Hives, Swelling, Rash   Use paper tape Angioedema   Latex Dermatitis   Zofran  [ondansetron  Hcl] Other (See Comments)   Prolonged QT   Ezetimibe Nausea And Vomiting, Nausea Only        Medication List        Accurate as of October 12, 2024  3:48 PM. If you have any questions, ask your nurse or doctor.          STOP taking these medications    cephALEXin  500 MG capsule Commonly known as: KEFLEX  Stopped by: Maudene Stotler A Oziah Vitanza   ciprofloxacin  250 MG tablet Commonly known as: CIPRO  Stopped by: Daylin Gruszka A Filemon Breton       TAKE these medications    albuterol  108 (90 Base) MCG/ACT  inhaler Commonly known as: VENTOLIN  HFA Inhale 1-2 puffs into the lungs every 6 (six) hours as needed for wheezing or shortness of breath.   betamethasone dipropionate 0.05 % cream Apply 1 Application topically 2 (two) times daily as needed (psoriasis).   botulinum toxin Type A 200 units injection Commonly known as: BOTOX Inject 200 Units as directed every 3 (three) months.   buPROPion  300 MG 24 hr tablet Commonly known as: WELLBUTRIN  XL Take 1  tablet by mouth daily.   calcium carbonate 750 MG chewable tablet Commonly known as: TUMS EX Chew 1 tablet by mouth as needed for heartburn.   cholecalciferol 25 MCG (1000 UNIT) tablet Commonly known as: VITAMIN D3 Take 1,000 Units by mouth daily.   clobetasol cream 0.05 % Commonly known as: TEMOVATE Apply 1 Application topically daily as needed (psoriasis).   desonide 0.05 % ointment Commonly known as: DESOWEN Apply 1 application  topically 2 (two) times daily as needed (psoriasis).   diclofenac  50 MG EC tablet Commonly known as: VOLTAREN  Take 2 tablets by mouth twice daily.   docusate sodium  100 MG capsule Commonly known as: Colace Take 1 capsule (100 mg total) by mouth 2 (two) times daily.   estradiol  0.1 MG/24HR patch Commonly known as: Vivelle -Dot Place 1 patch (0.1 mg total) onto the skin 2 (two) times a week.   estradiol  0.1 MG/GM vaginal cream Commonly known as: ESTRACE  1 gram at bedtime   FIBER PO Take 20 mg by mouth daily.   levothyroxine  88 MCG tablet Commonly known as: SYNTHROID  Take 88 mcg by mouth daily before breakfast.   methylphenidate  10 MG ER tablet TAKE ONE TABLET BY MOUTH IN THE MORNING   multivitamin with minerals Tabs tablet Take 1 tablet by mouth daily.   nystatin cream Commonly known as: MYCOSTATIN Apply 1 Application topically 2 (two) times daily as needed for dry skin (yeast).   omeprazole  40 MG capsule Commonly known as: PRILOSEC Take 1 capsule by mouth daily.   pregabalin  100 MG  capsule Commonly known as: LYRICA  Take 1 capsule (100 mg total) by mouth 2 (two) times daily. What changed: when to take this   Premarin  vaginal cream Generic drug: conjugated estrogens  Insert 1 gram vaginally nightly.   promethazine  25 MG tablet Commonly known as: PHENERGAN  Take 1 tablet (25 mg total) by mouth every 6 (six) hours as needed for nausea or vomiting.   promethazine  25 MG suppository Commonly known as: PHENERGAN  Place 1 suppository (25 mg total) rectally every 6 (six) hours as needed for nausea or vomiting.   Qulipta 60 MG Tabs Generic drug: Atogepant Take 60 mg by mouth daily.   scopolamine  1 MG/3DAYS Commonly known as: TRANSDERM-SCOP Place 1 patch (1.5 mg total) onto the skin every 3 (three) days.   Tremfya 100 MG/ML pen Generic drug: guselkumab Inject 1 Dose into the skin every 30 (thirty) days.         ROS:  {Review of Systems:30496}  Blood pressure 116/79, pulse 90, temperature 98.3 F (36.8 C), temperature source Oral, resp. rate 12, height 5' 3 (1.6 m), weight 157 lb (71.2 kg), last menstrual period 10/23/2016, SpO2 97%. Physical Exam  Results: No results found for this or any previous visit (from the past 48 hours).  No results found.   Assessment & Plan:  OBERA STAUCH is a 38 y.o. female with *** -*** -*** -Follow up ***  All questions were answered to the satisfaction of the patient and family***.  The risk and benefits of *** were discussed including but not limited to ***.  After careful consideration, ANALIYAH LECHUGA has decided to ***.    Qasim Diveley A Jayr Lupercio 10/12/2024, 3:48 PM   Note: Portions of this report may have been transcribed using voice recognition software. Every effort has been made to ensure accuracy; however, inadvertent computerized transcription errors may still be present.   Dorothyann Brittle, DO [facility] [address] [place], [place] [address] [phone]  (  office)

## 2024-10-19 ENCOUNTER — Ambulatory Visit: Admit: 2024-10-19 | Discharge: 2024-10-20 | Payer: BLUE CROSS/BLUE SHIELD

## 2024-10-19 DIAGNOSIS — M25561 Pain in right knee: Principal | ICD-10-CM

## 2024-10-19 DIAGNOSIS — M25312 Other instability, left shoulder: Principal | ICD-10-CM

## 2024-10-19 DIAGNOSIS — L409 Psoriasis, unspecified: Principal | ICD-10-CM

## 2024-10-19 DIAGNOSIS — G8929 Other chronic pain: Principal | ICD-10-CM

## 2024-10-19 MED ORDER — VTAMA 1 % TOPICAL CREAM
TOPICAL | 5 refills | 0.00000 days | Status: CP
Start: 2024-10-19 — End: ?

## 2024-10-26 DIAGNOSIS — L578 Other skin changes due to chronic exposure to nonionizing radiation: Principal | ICD-10-CM

## 2024-10-26 MED ORDER — TRETINOIN 0.025 % TOPICAL CREAM
TOPICAL | 5 refills | 0.00000 days | Status: CP
Start: 2024-10-26 — End: ?

## 2024-10-28 ENCOUNTER — Ambulatory Visit: Admit: 2024-10-28 | Admitting: Surgery

## 2024-10-28 SURGERY — CHOLECYSTECTOMY, ROBOT-ASSISTED, LAPAROSCOPIC
Anesthesia: General

## 2024-11-02 ENCOUNTER — Encounter (INDEPENDENT_AMBULATORY_CARE_PROVIDER_SITE_OTHER): Payer: Self-pay | Admitting: Otolaryngology

## 2024-11-02 ENCOUNTER — Ambulatory Visit (INDEPENDENT_AMBULATORY_CARE_PROVIDER_SITE_OTHER): Admitting: Otolaryngology

## 2024-11-02 VITALS — BP 119/79 | HR 81 | Ht 63.0 in | Wt 152.0 lb

## 2024-11-02 DIAGNOSIS — R590 Localized enlarged lymph nodes: Secondary | ICD-10-CM | POA: Diagnosis not present

## 2024-11-02 DIAGNOSIS — K219 Gastro-esophageal reflux disease without esophagitis: Secondary | ICD-10-CM

## 2024-11-02 DIAGNOSIS — R490 Dysphonia: Secondary | ICD-10-CM

## 2024-11-02 DIAGNOSIS — R1310 Dysphagia, unspecified: Secondary | ICD-10-CM | POA: Diagnosis not present

## 2024-11-02 DIAGNOSIS — E049 Nontoxic goiter, unspecified: Secondary | ICD-10-CM

## 2024-11-02 MED ORDER — PANTOPRAZOLE SODIUM 40 MG PO TBEC: 40.0000 mg | DELAYED_RELEASE_TABLET | Freq: Two times a day (BID) | ORAL | 1 refills | Status: DC

## 2024-11-02 NOTE — Progress Notes (Signed)
 Reason for Consult: Dysphagia and goiter Referring Physician: Dr. Myra Nest Sharon Rowland is an 38 y.o. female.  HPI: Patient with a longstanding history of Ehlers-Danlos syndrome.  She definitely has many of the symptoms of autonomic issues and dysphagia.  She has had this for a long time.  She has had countless number of surgeries for her condition.  She has had a thyroid  condition since 2020 with hypothyroidism.  She has been on thyroid  replacement ever since.  She has had ultrasounds that show a enlarged thyroid  of about 5 cm on 1 side and 4 cm on the other but no nodules of significance.  It is a heterogeneous enlargement.  Her throat is occasionally sore.  She has occasional hoarseness.  All of this is completely intermittent and she has times when its completely normal.  She is on omeprazole  for reflux once a day.  Past Medical History:  Diagnosis Date   Allergic rhinitis    Anemia    Arthritis    Asthma    last used inhaler 1 month ago   Complication of anesthesia    if given fentanyl  has severe itching, rash and wheezing   Cubital tunnel syndrome on left 07/2023   Depression    Dysphagia    Dysrhythmia    SVT   Ehlers-Danlos syndrome    Female bladder prolapse    Gastritis    GERD (gastroesophageal reflux disease)    with pregnancy only   Hashimoto's thyroiditis    Hypothyroidism    Lupus    Mast cell activation syndrome    Migraines    Osteoarthritis    Pelvic pain in antepartum period in second trimester 01/17/2014   Pneumonia    POTS (postural orthostatic tachycardia syndrome)    Pregnant 08/01/2015   Psoriatic arthritis (HCC)    PSVT (paroxysmal supraventricular tachycardia)    could not be confirmed with 2016 study   Rectocele    Sjogren's syndrome    Thoracic outlet syndrome    Thyroid  disease    Urethral prolapse    Uterine prolapse    Vaginal Pap smear, abnormal     Past Surgical History:  Procedure Laterality Date   ANKLE SURGERY Bilateral    2023  L ankle debridement and internal brace 2024 R ankle debridement and internal brace   ANTERIOR AND POSTERIOR VAGINAL REPAIR     ANTERIOR INTEROSSEOUS NERVE DECOMPRESSION Left 08/13/2023   Procedure: SUBCUTANEOUS ANTERIOR TRANSPOSITION OF ULNAR NERVE AT LEFT ELBOW;  Surgeon: Edie Norleen PARAS, MD;  Location: [facility] ORS;  Service: Orthopedics;  Laterality: Left;   BREAST REDUCTION SURGERY Bilateral 08/20/2022   Procedure: MAMMARY REDUCTION  (BREAST) with Liposuction;  Surgeon: Marene Sieving, MD;  Location: [facility] OR;  Service: Plastics;  Laterality: Bilateral;  3 hours   COLONOSCOPY WITH ESOPHAGOGASTRODUODENOSCOPY (EGD)  12/26/2022   CYSTOCELE REPAIR N/A 09/17/2017   Procedure: ANTERIOR REPAIR (CYSTOCELE);  Surgeon: Jayne Vonn DEL, MD;  Location: [facility];  Service: Gynecology;  Laterality: N/A;   KNEE SURGERY Left 11/2021   KNEE SURGERY Right 2023   LABIOPLASTY N/A 03/24/2024   Procedure: LABIAPLASTY, VULVA;  Surgeon: Jayne Vonn DEL, MD;  Location: [facility];  Service: Gynecology;  Laterality: N/A;   LIPOSUCTION  03/2022   PANNICULECTOMY N/A 04/28/2023   Procedure: PANNICULECTOMY;  Surgeon: Waddell Leonce NOVAK, MD;  Location: [facility] OR;  Service: Plastics;  Laterality: N/A;   SALPINGO-OOPHORECTOMY, BILATERAL, ROBOT ASSISTED, LAPAROSCOPIC Bilateral 03/24/2024   Procedure: SALPINGO-OOPHORECTOMY, BILATERAL, ROBOT ASSISTED, LAPAROSCOPIC, D5;  Surgeon: Jayne Vonn DEL, MD;  Location: [facility];  Service: Gynecology;  Laterality: Bilateral;   SUPRAVENTRICULAR TACHYCARDIA ABLATION  01/05/2015   unsuccessful   SUPRAVENTRICULAR TACHYCARDIA ABLATION N/A 01/05/2015   Procedure: SUPRAVENTRICULAR TACHYCARDIA ABLATION;  Surgeon: Danelle LELON [place], MD;  Location: [facility] CATH LAB;  Service: Cardiovascular;  Laterality: N/A;   Thoracic outlet decompression  2024   VAGINAL HYSTERECTOMY N/A 09/17/2017   Procedure: HYSTERECTOMY VAGINAL WITH POSSIBLE BILATERAL SALPINGECTOMY;  Surgeon: Jayne Vonn DEL, MD;  Location: [facility];  Service: Gynecology;   Laterality: N/A;   WRIST GANGLION EXCISION Left    x2    Family History  Problem Relation Age of Onset   Hypertension Mother    Hyperlipidemia Mother    Multiple sclerosis Mother    COPD Mother    Cancer Mother        melanoma x 2   Diabetes Mother    Heart disease Father        PSVT   Hyperlipidemia Brother    Hypertension Brother    Heart disease Maternal Grandfather        heart attack   Cancer Paternal Grandmother        pancreatic cancer   Heart disease Paternal Grandmother        MI   Hyperlipidemia Paternal Grandmother    Hypertension Paternal Grandmother    Asthma Daughter    Asthma Son    Autism Son    ADD / ADHD Son    Tourette syndrome Son    Other Son        Neurological and mental issues   ADD / ADHD Son     Social History:  reports that she has never smoked. She has never been exposed to tobacco smoke. She has never used smokeless tobacco. She reports that she does not drink alcohol and does not use drugs.  Allergies:  Allergies  Allergen Reactions   Apremilast Dermatitis, Rash and Other (See Comments)    Mood changes Hallucination DMAR  Pt stated this med makes her depression worse, makes her angry.   Cosentyx [Secukinumab] Anaphylaxis   Fentanyl  Itching, Dermatitis, Rash and Other (See Comments)    Wheezing    Hydroxychloroquine Palpitations and Other (See Comments)    Prolonged QT Irregular heart rate   Metoclopramide  Other (See Comments)    DO NOT GIVE VIA IV (can take PO) Causes Tremors, Jerking, Tachycardia   Other Rash    Lidocaine  Patches Adhesive Burn Scar Skin    Prochlorperazine  Other (See Comments)    DO NOT GIVE VIA IV  (can take PO) Causes Tremors, Jerking, Tachycardia   Semaglutide Dermatitis   Silver  Hives, Dermatitis and Rash   Tape Hives, Swelling and Rash    Use paper tape Angioedema   Latex Dermatitis   Zofran  [Ondansetron  Hcl] Other (See Comments)    Prolonged QT   Ezetimibe Nausea And Vomiting and Nausea Only     Medications: I have reviewed the patient's current medications.  No results found for this or any previous visit (from the past 48 hours).  No results found.  ROS Blood pressure 119/79, pulse 81, height 5' 3 (1.6 m), weight 152 lb (68.9 kg), last menstrual period 10/23/2016, SpO2 94%. Physical Exam Constitutional:      Appearance: Normal appearance.  HENT:     Head: Normocephalic and atraumatic.     Right Ear: Tympanic membrane is without lesions and middle ear aerated, ear canal and external ear normal.  Left Ear: Tympanic membrane is without lesions and middle ear aerated, ear canal and external ear normal.     Nose: Nose normal. Turbinates with mild hypertrophy, No significant swelling or masses.     Oral cavity/oropharynx: Mucous membranes are moist. No lesions or masses    Larynx: normal voice. Mirror attempted without success    Eyes:     Extraocular Movements: Extraocular movements intact.     Conjunctiva/sclera: Conjunctivae normal.     Pupils: Pupils are equal, round, and reactive to light.  Cardiovascular:     Rate and Rhythm: Normal rate.  Pulmonary:     Effort: Pulmonary effort is normal.  Musculoskeletal:     Cervical back: Normal range of motion and neck supple. No rigidity.  Lymphadenopathy:     Cervical: No cervical adenopathy or masses.salivary glands without lesions. .  Neurological:     Mental Status: He is alert. CN 2-12 intact. No nystagmus      Assessment/Plan: Dysphagia-we talked about her thyroid  problem and that whether the thyroid  enlargement could be a compression symptoms.  Given what I see on the report of the ultrasound I doubt at this point it is compression but rather her condition of autonomic dysfunction which she has had longer than the known thyroid  problem.  It also could be reflux that is a component of the intermittent sore throat and hoarseness.  We talked about a fiberoptic exam today but she feels like maybe treatment first  would be most appropriate and I agree.  I will start her on Protonix  twice a day.  She will stop her omeprazole .  She will come back in 1 month for reevaluation and if she continues then we will discuss a flexible scope and a barium swallow.  Thyroid  enlargement-I will order another ultrasound this more recent and then most likely will follow serial ultrasounds just to make sure she is not changing as it does not seem like surgery is indicated at this point.  Norleen Notice 11/02/2024, 9:08 AM

## 2024-11-10 ENCOUNTER — Ambulatory Visit (HOSPITAL_COMMUNITY)
Admission: RE | Admit: 2024-11-10 | Discharge: 2024-11-10 | Disposition: A | Discharge status: Home / Self Care | Source: Ambulatory Visit | Attending: Otolaryngology | Admitting: Otolaryngology

## 2024-11-10 DIAGNOSIS — E049 Nontoxic goiter, unspecified: Secondary | ICD-10-CM | POA: Insufficient documentation

## 2024-11-16 ENCOUNTER — Emergency Department (HOSPITAL_COMMUNITY)

## 2024-11-16 ENCOUNTER — Other Ambulatory Visit: Payer: Self-pay

## 2024-11-16 ENCOUNTER — Encounter (HOSPITAL_COMMUNITY): Payer: Self-pay | Admitting: Emergency Medicine

## 2024-11-16 ENCOUNTER — Inpatient Hospital Stay (HOSPITAL_COMMUNITY)
Admission: EM | Admit: 2024-11-16 | Discharge: 2024-11-21 | DRG: 815 | Disposition: A | Discharge status: Home / Self Care | Attending: Hospitalist | Admitting: Hospitalist

## 2024-11-16 DIAGNOSIS — Z820 Family history of epilepsy and other diseases of the nervous system: Secondary | ICD-10-CM

## 2024-11-16 DIAGNOSIS — E063 Autoimmune thyroiditis: Secondary | ICD-10-CM | POA: Diagnosis present

## 2024-11-16 DIAGNOSIS — R112 Nausea with vomiting, unspecified: Principal | ICD-10-CM | POA: Diagnosis present

## 2024-11-16 DIAGNOSIS — Z79899 Other long term (current) drug therapy: Secondary | ICD-10-CM

## 2024-11-16 DIAGNOSIS — K2970 Gastritis, unspecified, without bleeding: Secondary | ICD-10-CM | POA: Diagnosis present

## 2024-11-16 DIAGNOSIS — K3184 Gastroparesis: Secondary | ICD-10-CM | POA: Diagnosis present

## 2024-11-16 DIAGNOSIS — E861 Hypovolemia: Secondary | ICD-10-CM | POA: Diagnosis present

## 2024-11-16 DIAGNOSIS — K219 Gastro-esophageal reflux disease without esophagitis: Secondary | ICD-10-CM | POA: Diagnosis present

## 2024-11-16 DIAGNOSIS — I471 Supraventricular tachycardia, unspecified: Secondary | ICD-10-CM | POA: Diagnosis present

## 2024-11-16 DIAGNOSIS — Z888 Allergy status to other drugs, medicaments and biological substances status: Secondary | ICD-10-CM

## 2024-11-16 DIAGNOSIS — M1990 Unspecified osteoarthritis, unspecified site: Secondary | ICD-10-CM | POA: Diagnosis present

## 2024-11-16 DIAGNOSIS — Z791 Long term (current) use of non-steroidal anti-inflammatories (NSAID): Secondary | ICD-10-CM

## 2024-11-16 DIAGNOSIS — E8770 Fluid overload, unspecified: Secondary | ICD-10-CM | POA: Diagnosis present

## 2024-11-16 DIAGNOSIS — Z833 Family history of diabetes mellitus: Secondary | ICD-10-CM

## 2024-11-16 DIAGNOSIS — Z91040 Latex allergy status: Secondary | ICD-10-CM

## 2024-11-16 DIAGNOSIS — G629 Polyneuropathy, unspecified: Secondary | ICD-10-CM | POA: Diagnosis present

## 2024-11-16 DIAGNOSIS — J45909 Unspecified asthma, uncomplicated: Secondary | ICD-10-CM | POA: Diagnosis present

## 2024-11-16 DIAGNOSIS — Z825 Family history of asthma and other chronic lower respiratory diseases: Secondary | ICD-10-CM

## 2024-11-16 DIAGNOSIS — Z8249 Family history of ischemic heart disease and other diseases of the circulatory system: Secondary | ICD-10-CM

## 2024-11-16 DIAGNOSIS — Q796 Ehlers-Danlos syndrome, unspecified: Secondary | ICD-10-CM

## 2024-11-16 DIAGNOSIS — Z79890 Hormone replacement therapy (postmenopausal): Secondary | ICD-10-CM

## 2024-11-16 DIAGNOSIS — G8929 Other chronic pain: Secondary | ICD-10-CM | POA: Diagnosis present

## 2024-11-16 DIAGNOSIS — Z800 Family history of malignant neoplasm of digestive organs: Secondary | ICD-10-CM

## 2024-11-16 DIAGNOSIS — Z83438 Family history of other disorder of lipoprotein metabolism and other lipidemia: Secondary | ICD-10-CM

## 2024-11-16 DIAGNOSIS — G90A Postural orthostatic tachycardia syndrome (POTS): Secondary | ICD-10-CM | POA: Diagnosis present

## 2024-11-16 DIAGNOSIS — Z91048 Other nonmedicinal substance allergy status: Secondary | ICD-10-CM

## 2024-11-16 DIAGNOSIS — Z808 Family history of malignant neoplasm of other organs or systems: Secondary | ICD-10-CM

## 2024-11-16 DIAGNOSIS — D8940 Mast cell activation, unspecified: Principal | ICD-10-CM | POA: Diagnosis present

## 2024-11-16 DIAGNOSIS — M3500 Sicca syndrome, unspecified: Secondary | ICD-10-CM | POA: Diagnosis present

## 2024-11-16 DIAGNOSIS — Z90710 Acquired absence of both cervix and uterus: Secondary | ICD-10-CM

## 2024-11-16 DIAGNOSIS — Z9049 Acquired absence of other specified parts of digestive tract: Secondary | ICD-10-CM

## 2024-11-16 DIAGNOSIS — L4050 Arthropathic psoriasis, unspecified: Secondary | ICD-10-CM | POA: Diagnosis present

## 2024-11-16 LAB — URINALYSIS, ROUTINE W REFLEX MICROSCOPIC
Bilirubin Urine: NEGATIVE
Glucose, UA: NEGATIVE mg/dL
Hgb urine dipstick: NEGATIVE
Ketones, ur: 5 mg/dL — AB
Leukocytes,Ua: NEGATIVE
Nitrite: NEGATIVE
Protein, ur: NEGATIVE mg/dL
Specific Gravity, Urine: 1.003 — ABNORMAL LOW (ref 1.005–1.030)
pH: 6 (ref 5.0–8.0)

## 2024-11-16 LAB — CBC WITH DIFFERENTIAL/PLATELET
Abs Immature Granulocytes: 0.02 K/uL (ref 0.00–0.07)
Basophils Absolute: 0 K/uL (ref 0.0–0.1)
Basophils Relative: 1 %
Eosinophils Absolute: 0.1 K/uL (ref 0.0–0.5)
Eosinophils Relative: 1 %
HCT: 38.7 % (ref 36.0–46.0)
Hemoglobin: 13 g/dL (ref 12.0–15.0)
Immature Granulocytes: 0 %
Lymphocytes Relative: 34 %
Lymphs Abs: 2.9 K/uL (ref 0.7–4.0)
MCH: 27.1 pg (ref 26.0–34.0)
MCHC: 33.6 g/dL (ref 30.0–36.0)
MCV: 80.8 fL (ref 80.0–100.0)
Monocytes Absolute: 0.8 K/uL (ref 0.1–1.0)
Monocytes Relative: 9 %
Neutro Abs: 4.8 K/uL (ref 1.7–7.7)
Neutrophils Relative %: 55 %
Platelets: 323 K/uL (ref 150–400)
RBC: 4.79 MIL/uL (ref 3.87–5.11)
RDW: 14 % (ref 11.5–15.5)
WBC: 8.5 K/uL (ref 4.0–10.5)
nRBC: 0 % (ref 0.0–0.2)

## 2024-11-16 LAB — COMPREHENSIVE METABOLIC PANEL WITH GFR
ALT: 13 U/L (ref 0–44)
AST: <10 U/L — ABNORMAL LOW (ref 15–41)
Albumin: 4.9 g/dL (ref 3.5–5.0)
Alkaline Phosphatase: 85 U/L (ref 38–126)
Anion gap: 11 (ref 5–15)
BUN: <5 mg/dL — ABNORMAL LOW (ref 6–20)
CO2: 26 mmol/L (ref 22–32)
Calcium: 9.5 mg/dL (ref 8.9–10.3)
Chloride: 102 mmol/L (ref 98–111)
Creatinine, Ser: 0.71 mg/dL (ref 0.44–1.00)
GFR, Estimated: >60 mL/min (ref >60)
Glucose, Bld: 97 mg/dL (ref 70–99)
Potassium: 3.5 mmol/L (ref 3.5–5.1)
Sodium: 139 mmol/L (ref 135–145)
Total Bilirubin: 0.4 mg/dL (ref 0.0–1.2)
Total Protein: 7.4 g/dL (ref 6.5–8.1)

## 2024-11-16 LAB — LIPASE, BLOOD: Lipase: 19 U/L (ref 11–51)

## 2024-11-16 MED ORDER — IOHEXOL 300 MG/ML  SOLN
100.0000 mL | Freq: Once | INTRAMUSCULAR | Status: AC | PRN
  Administered 2024-11-16: 100 mL via INTRAVENOUS

## 2024-11-16 MED ORDER — SODIUM CHLORIDE 0.9 % IV BOLUS
1000.0000 mL | Freq: Once | INTRAVENOUS | Status: AC
  Administered 2024-11-16: 1000 mL via INTRAVENOUS

## 2024-11-16 MED ORDER — SODIUM CHLORIDE 0.9 % IV SOLN
12.5000 mg | Freq: Once | INTRAVENOUS | Status: AC
  Administered 2024-11-16: 12.5 mg via INTRAVENOUS
  Filled 2024-11-16: qty 0.5

## 2024-11-16 MED ORDER — ONDANSETRON HCL 4 MG/2ML IJ SOLN
4.0000 mg | Freq: Once | INTRAMUSCULAR | Status: DC
  Filled 2024-11-16: qty 2

## 2024-11-16 MED ORDER — PROMETHAZINE HCL 25 MG/ML IJ SOLN
INTRAMUSCULAR | Status: AC
  Filled 2024-11-16: qty 1

## 2024-11-16 MED ORDER — ALUM & MAG HYDROXIDE-SIMETH 200-200-20 MG/5ML PO SUSP: 30.0000 mL | Freq: Once | ORAL | Status: DC

## 2024-11-16 MED ORDER — KETOROLAC TROMETHAMINE 15 MG/ML IJ SOLN
15.0000 mg | Freq: Once | INTRAMUSCULAR | Status: AC
  Administered 2024-11-16: 15 mg via INTRAVENOUS
  Filled 2024-11-16: qty 1

## 2024-11-16 MED ORDER — PANTOPRAZOLE SODIUM 40 MG IV SOLR
40.0000 mg | Freq: Once | INTRAVENOUS | Status: AC
Start: 2024-11-16 — End: 2024-11-16
  Administered 2024-11-16: 40 mg via INTRAVENOUS
  Filled 2024-11-16: qty 10

## 2024-11-16 NOTE — ED Triage Notes (Signed)
 Pt presents with abd pain intermittently x 3 weeks, worsening x 2 days ago, nausea with intermittent emesis; hx of gastroparesis, also uses phenergan  suppositories, use patches and Benadryl  for nausea but has had no relief; reports hx of umbilical hernia

## 2024-11-16 NOTE — ED Provider Notes (Signed)
 [name] [facility] AT [facility] Provider Note   CSN: [id number] Arrival date & time: 11/16/24  1814     Patient presents with: Abdominal Pain   Sharon Rowland is a 38 y.o. female patient presents to emergency room with complaint of abdominal pain.  She reports this been ongoing for the last 3 weeks worse over the last 2 days.  She notes poor appetite, nausea and vomiting.  She has intermittently tried home medications without any improvement.  She does report normal bowel movements and she is passing gas.  Reports she has history of gastroparesis and she feels that her gastroparesis is likely getting worse.  Denies any fever.  She is currently not established with GI doctor.    Abdominal Pain      Prior to Admission medications   Medication Sig Start Date End Date Taking? Authorizing Provider  albuterol  (VENTOLIN  HFA) 108 (90 Base) MCG/ACT inhaler Inhale 1-2 puffs into the lungs every 6 (six) hours as needed for wheezing or shortness of breath.    [provider]  Atogepant  (QULIPTA ) 60 MG TABS Take 60 mg by mouth daily.    [provider]  betamethasone dipropionate 0.05 % cream Apply 1 Application topically 2 (two) times daily as needed (psoriasis).    [provider]  botulinum toxin Type A (BOTOX) 200 units injection Inject 200 Units as directed every 3 (three) months.    [provider]  buPROPion  (WELLBUTRIN  XL) 300 MG 24 hr tablet Take 1 tablet by mouth daily. 09/05/23   Jayne Vonn DEL, MD  calcium  carbonate (TUMS EX) 750 MG chewable tablet Chew 1 tablet by mouth as needed for heartburn.    [provider]  cholecalciferol (VITAMIN D3) 25 MCG (1000 UNIT) tablet Take 1,000 Units by mouth daily.    [provider]  clobetasol cream (TEMOVATE) 0.05 % Apply 1 Application topically daily as needed (psoriasis).    [provider]  desonide (DESOWEN) 0.05 % ointment Apply 1 application  topically 2  (two) times daily as needed (psoriasis). 04/24/20   [provider]  diclofenac  (VOLTAREN ) 50 MG EC tablet Take 2 tablets by mouth twice daily. 09/05/23   Jayne Vonn DEL, MD  docusate sodium  (COLACE) 100 MG capsule Take 1 capsule (100 mg total) by mouth 2 (two) times daily. 01/19/24 01/18/25  Pappayliou, Dorothyann A, DO  estradiol  (ESTRACE ) 0.1 MG/GM vaginal cream 1 gram at bedtime 07/02/24   Jayne Vonn DEL, MD  estradiol  (VIVELLE -DOT) 0.1 MG/24HR patch Place 1 patch (0.1 mg total) onto the skin 2 (two) times a week. 05/06/24   Jayne Vonn DEL, MD  FIBER PO Take 20 mg by mouth daily.    [provider]  Guselkumab (TREMFYA) 100 MG/ML SOPN Inject 1 Dose into the skin every 30 (thirty) days.    [provider]  levothyroxine  (SYNTHROID ) 88 MCG tablet Take 88 mcg by mouth daily before breakfast. 10/28/22   [provider]  methylphenidate  10 MG ER tablet TAKE ONE TABLET BY MOUTH IN THE MORNING 04/18/23   Jayne Vonn DEL, MD  Multiple Vitamin (MULTIVITAMIN WITH MINERALS) TABS tablet Take 1 tablet by mouth daily.    [provider]  nystatin cream (MYCOSTATIN) Apply 1 Application topically 2 (two) times daily as needed for dry skin (yeast).    [provider]  pantoprazole  (PROTONIX ) 40 MG tablet Take 1 tablet (40 mg total) by mouth 2 (two) times daily. 11/02/24 12/02/24  Roark Rush, MD  pregabalin  (LYRICA ) 100 MG capsule Take 1 capsule (100 mg total) by mouth 2 (two) times daily. Patient taking differently: Take 100 mg by mouth daily. 10/23/22   Jayne Vonn DEL, MD  PREMARIN  vaginal cream Insert 1 gram vaginally nightly. 05/02/24   Jayne Vonn DEL, MD  promethazine  (PHENERGAN ) 25 MG suppository Place 1 suppository (25 mg total) rectally every 6 (six) hours as needed for nausea or vomiting. 05/23/24   Daralene Lonni BIRCH, PA-C  promethazine  (PHENERGAN ) 25 MG tablet Take 1 tablet (25 mg total) by mouth every 6 (six) hours as needed for nausea or vomiting. 05/23/24    Daralene Lonni BIRCH, PA-C  scopolamine  (TRANSDERM-SCOP) 1 MG/3DAYS Place 1 patch (1.5 mg total) onto the skin every 3 (three) days. 01/25/24   Cleotilde Rogue, MD    Allergies: Apremilast, Cosentyx [secukinumab], Fentanyl , Hydroxychloroquine, Metoclopramide , Other, Prochlorperazine , Semaglutide, Silver , Tape, Latex, Zofran  [ondansetron  hcl], and Ezetimibe    Review of Systems  Gastrointestinal:  Positive for abdominal pain.    Updated Vital Signs BP 111/89   Pulse (!) 127   Temp 98.7 F (37.1 C) (Oral)   Resp 17   Ht 5' 5 (1.651 m)   Wt 65.8 kg   LMP 10/23/2016 (Approximate)   SpO2 96%   BMI 24.13 kg/m   Physical Exam Vitals and nursing note reviewed.  Constitutional:      General: She is not in acute distress.    Appearance: She is not toxic-appearing.  HENT:     Head: Normocephalic and atraumatic.  Eyes:     General: No scleral icterus.    Conjunctiva/sclera: Conjunctivae normal.  Cardiovascular:     Rate and Rhythm: Normal rate and regular rhythm.     Pulses: Normal pulses.     Heart sounds: Normal heart sounds.  Pulmonary:     Effort: Pulmonary effort is normal. No respiratory distress.     Breath sounds: Normal breath sounds.  Abdominal:     General: Abdomen is flat. Bowel sounds are normal.     Palpations: Abdomen is soft.     Tenderness: There is abdominal tenderness.     Comments: Generalized TTP over abd  Skin:    General: Skin is warm and dry.     Findings: No lesion.  Neurological:     General: No focal deficit present.     Mental Status: She is alert and oriented to person, place, and time. Mental status is at baseline.     (all labs ordered are listed, but only abnormal results are displayed) Labs Reviewed  COMPREHENSIVE METABOLIC PANEL WITH GFR - Abnormal; Notable for the following components:      Result Value   BUN <5 (*)    AST <10 (*)    All other components within normal limits  URINALYSIS, ROUTINE W REFLEX MICROSCOPIC - Abnormal; Notable  for the following components:   Color, Urine STRAW (*)    Specific Gravity, Urine 1.003 (*)    Ketones, ur 5 (*)    All other components within normal limits  LIPASE, BLOOD  CBC WITH DIFFERENTIAL/PLATELET    EKG: None  Radiology: CT ABDOMEN PELVIS W CONTRAST Result Date: 11/16/2024 EXAM: CT ABDOMEN AND PELVIS WITH CONTRAST 11/16/2024 09:01:17 PM TECHNIQUE: CT of the abdomen and pelvis was performed with the administration of 100 mL of iohexol  (OMNIPAQUE ) 300 MG/ML solution. Multiplanar reformatted images are provided for review. Automated exposure control, iterative reconstruction, and/or weight-based adjustment of the mA/kV was utilized to reduce the radiation dose to  as low as reasonably achievable. COMPARISON: Comparison with 09/11/2024. CLINICAL HISTORY: Abdominal pain, acute, nonlocalized. FINDINGS: LOWER CHEST: Mild dependent atelectasis in the lung bases. LIVER: Mild diffuse fatty infiltration of the liver. GALLBLADDER AND BILE DUCTS: Surgical absence of the gallbladder. No biliary ductal dilatation. SPLEEN: No acute abnormality. PANCREAS: No acute abnormality. ADRENAL GLANDS: No acute abnormality. KIDNEYS, URETERS AND BLADDER: No stones in the kidneys or ureters. No hydronephrosis. No perinephric or periureteral stranding. Urinary bladder is unremarkable. GI AND BOWEL: Stomach demonstrates no acute abnormality. Liquid stool in the colon. There is no bowel obstruction. APPENDIX: The appendix is normal. PERITONEUM AND RETROPERITONEUM: No ascites. No free air. VASCULATURE: Aorta is normal in caliber. LYMPH NODES: No lymphadenopathy. REPRODUCTIVE ORGANS: The uterus is surgically absent. BONES AND SOFT TISSUES: No acute osseous abnormality. No focal soft tissue abnormality. IMPRESSION: 1. Liquid stool in the colon, possibly indicating viral enterocolitis. 2. No evidence of obstruction. Electronically signed by: Elsie Gravely MD 11/16/2024 09:08 PM EST RP Workstation: [id number]     Procedures    Medications Ordered in the ED  alum & mag hydroxide-simeth (MAALOX/MYLANTA) 200-200-20 MG/5ML suspension 30 mL (30 mLs Oral Patient Refused/Not Given 11/16/24 2040)  sodium chloride  0.9 % bolus 1,000 mL (1,000 mLs Intravenous New Bag/Given 11/16/24 2006)  promethazine  (PHENERGAN ) 12.5 mg in sodium chloride  0.9 % 50 mL IVPB (0 mg Intravenous Stopped 11/16/24 2048)  ketorolac  (TORADOL ) 15 MG/ML injection 15 mg (15 mg Intravenous Given 11/16/24 2007)  pantoprazole  (PROTONIX ) injection 40 mg (40 mg Intravenous Given 11/16/24 2112)  iohexol  (OMNIPAQUE ) 300 MG/ML solution 100 mL (100 mLs Intravenous Contrast Given 11/16/24 2054)                                    Medical Decision Making Amount and/or Complexity of Data Reviewed Labs: ordered. Radiology: ordered.  Risk OTC drugs. Prescription drug management.   This patient presents to the ED for concern of abdominal pain, this involves an extensive number of treatment options, and is a complaint that carries with it a high risk of complications and morbidity.  The differential diagnosis includes cholecystitis, AAA, appendicitis, renal stone, UTI   Co morbidities that complicate the patient evaluation  History of gastroparesis   Additional history obtained:  Additional history obtained from patient most recently seen 09/11/2024 for abdominal pain/right flank pain.   Lab Tests:  I personally interpreted labs.  The pertinent results include:   CBC, CMP, lipase unremarkable   Imaging Studies ordered:  I ordered imaging studies including CT scan of abdomen and pelvis I independently visualized and interpreted imaging which showed patient's viral enterocolitis, no acute findings.  I agree with the radiologist interpretation   Cardiac Monitoring: / EKG:  The patient was maintained on a cardiac monitor.     Problem List / ED Course / Critical interventions / Medication management  Presents to emergency room with complaint  of abdominal pain.  She reports she has chronic abdominal pain that seems to be worse over the last 2 days.  She has history of gastroparesis and reports she has not been tolerating oral intake well over the last few days.  She is still having bowel movements and she is passing gas so suspect less likely to be bowel obstruction.  She has no focal area of tenderness on abdominal exam.  We did have shared decision making about obtaining CT given worsening symptoms and she would like to  proceed with imaging.  Will give pain medicine and nausea medicine and reassess.  Her blood work is so far reassuring with no acute abnormalities. Imaging shows no acute findings. Tried scopolamine  and benadryl  at home. Patient failed p.o. trial here after antiemetic.  Options are somewhat limited secondary to patient's QT prolongation. I ordered medication including Phenergan , Toradol  and normal saline have worked for her in the past for gastroparesis.  GI cocktail. Reevaluation of the patient after these medicines showed that the patient improved I have reviewed the patients home medicines and have made adjustments as needed      Final diagnoses:  Intractable nausea and vomiting    ED Discharge Orders     None          Shermon Warren SAILOR, PA-C 11/16/24 2242    Simon Lavonia SAILOR, MD 11/16/24 [phone]

## 2024-11-17 DIAGNOSIS — G8929 Other chronic pain: Secondary | ICD-10-CM | POA: Diagnosis present

## 2024-11-17 DIAGNOSIS — R112 Nausea with vomiting, unspecified: Secondary | ICD-10-CM

## 2024-11-17 DIAGNOSIS — Q796 Ehlers-Danlos syndrome, unspecified: Secondary | ICD-10-CM

## 2024-11-17 DIAGNOSIS — G894 Chronic pain syndrome: Secondary | ICD-10-CM

## 2024-11-17 DIAGNOSIS — D8940 Mast cell activation, unspecified: Secondary | ICD-10-CM | POA: Diagnosis present

## 2024-11-17 LAB — CBC
HCT: 35.3 % — ABNORMAL LOW (ref 36.0–46.0)
Hemoglobin: 11.4 g/dL — ABNORMAL LOW (ref 12.0–15.0)
MCH: 26.9 pg (ref 26.0–34.0)
MCHC: 32.3 g/dL (ref 30.0–36.0)
MCV: 83.3 fL (ref 80.0–100.0)
Platelets: 273 K/uL (ref 150–400)
RBC: 4.24 MIL/uL (ref 3.87–5.11)
RDW: 14.2 % (ref 11.5–15.5)
WBC: 6.3 K/uL (ref 4.0–10.5)
nRBC: 0 % (ref 0.0–0.2)

## 2024-11-17 LAB — BASIC METABOLIC PANEL WITH GFR
Anion gap: 8 (ref 5–15)
BUN: <5 mg/dL — ABNORMAL LOW (ref 6–20)
CO2: 26 mmol/L (ref 22–32)
Calcium: 8.6 mg/dL — ABNORMAL LOW (ref 8.9–10.3)
Chloride: 108 mmol/L (ref 98–111)
Creatinine, Ser: 0.71 mg/dL (ref 0.44–1.00)
GFR, Estimated: >60 mL/min (ref >60)
Glucose, Bld: 92 mg/dL (ref 70–99)
Potassium: 3.6 mmol/L (ref 3.5–5.1)
Sodium: 142 mmol/L (ref 135–145)

## 2024-11-17 LAB — MAGNESIUM: Magnesium: 2.5 mg/dL — ABNORMAL HIGH (ref 1.7–2.4)

## 2024-11-17 LAB — PHOSPHORUS: Phosphorus: 2.6 mg/dL (ref 2.5–4.6)

## 2024-11-17 MED ORDER — DIPHENHYDRAMINE HCL 50 MG/ML IJ SOLN: 25.0000 mg | Freq: Three times a day (TID) | INTRAMUSCULAR | Status: DC | PRN

## 2024-11-17 MED ORDER — MELATONIN 3 MG PO TABS
6.0000 mg | ORAL_TABLET | Freq: Every evening | ORAL | Status: DC | PRN
  Administered 2024-11-18 – 2024-11-20 (×3): 6 mg via ORAL
  Filled 2024-11-17 (×3): qty 2

## 2024-11-17 MED ORDER — CALCIUM CARBONATE ANTACID 500 MG PO CHEW
1.0000 | CHEWABLE_TABLET | ORAL | Status: DC | PRN
  Administered 2024-11-19: 200 mg via ORAL
  Filled 2024-11-17 (×2): qty 1

## 2024-11-17 MED ORDER — ATOGEPANT 60 MG PO TABS: 60.0000 mg | ORAL_TABLET | Freq: Every day | ORAL | Status: DC

## 2024-11-17 MED ORDER — PREGABALIN 50 MG PO CAPS
100.0000 mg | ORAL_CAPSULE | Freq: Every day | ORAL | Status: DC
  Administered 2024-11-17 – 2024-11-21 (×5): 100 mg via ORAL
  Filled 2024-11-17 (×5): qty 2

## 2024-11-17 MED ORDER — PANTOPRAZOLE SODIUM 40 MG IV SOLR
40.0000 mg | Freq: Two times a day (BID) | INTRAVENOUS | Status: DC
  Administered 2024-11-18 – 2024-11-21 (×8): 40 mg via INTRAVENOUS
  Filled 2024-11-17 (×8): qty 10

## 2024-11-17 MED ORDER — ACETAMINOPHEN 500 MG PO TABS
500.0000 mg | ORAL_TABLET | Freq: Four times a day (QID) | ORAL | Status: DC | PRN
  Administered 2024-11-19 (×2): 500 mg via ORAL
  Filled 2024-11-17 (×2): qty 1

## 2024-11-17 MED ORDER — PANTOPRAZOLE SODIUM 40 MG PO TBEC
40.0000 mg | DELAYED_RELEASE_TABLET | Freq: Two times a day (BID) | ORAL | Status: DC
  Administered 2024-11-17: 40 mg via ORAL
  Filled 2024-11-17 (×2): qty 1

## 2024-11-17 MED ORDER — DIPHENHYDRAMINE HCL 50 MG/ML IJ SOLN
25.0000 mg | Freq: Once | INTRAMUSCULAR | Status: AC
  Administered 2024-11-17: 25 mg via INTRAVENOUS
  Filled 2024-11-17: qty 1

## 2024-11-17 MED ORDER — BUPROPION HCL ER (XL) 300 MG PO TB24
300.0000 mg | ORAL_TABLET | Freq: Every day | ORAL | Status: DC
  Administered 2024-11-17 – 2024-11-21 (×5): 300 mg via ORAL
  Filled 2024-11-17 (×5): qty 1

## 2024-11-17 MED ORDER — PROCHLORPERAZINE MALEATE 5 MG PO TABS: 5.0000 mg | ORAL_TABLET | Freq: Four times a day (QID) | ORAL | Status: DC | PRN

## 2024-11-17 MED ORDER — LACTATED RINGERS IV SOLN: INTRAVENOUS | Status: AC

## 2024-11-17 MED ORDER — ADULT MULTIVITAMIN W/MINERALS CH
1.0000 | ORAL_TABLET | Freq: Every day | ORAL | Status: DC
  Administered 2024-11-17 – 2024-11-21 (×5): 1 via ORAL
  Filled 2024-11-17 (×5): qty 1

## 2024-11-17 MED ORDER — DIPHENHYDRAMINE HCL 50 MG/ML IJ SOLN
25.0000 mg | Freq: Three times a day (TID) | INTRAMUSCULAR | Status: DC | PRN
  Administered 2024-11-17 – 2024-11-18 (×3): 25 mg via INTRAVENOUS
  Filled 2024-11-17 (×3): qty 1

## 2024-11-17 MED ORDER — ACETAMINOPHEN 10 MG/ML IV SOLN
1000.0000 mg | Freq: Three times a day (TID) | INTRAVENOUS | Status: AC
  Administered 2024-11-17 – 2024-11-18 (×3): 1000 mg via INTRAVENOUS
  Filled 2024-11-17 (×3): qty 100

## 2024-11-17 MED ORDER — METHYLPHENIDATE HCL 5 MG PO TABS
5.0000 mg | ORAL_TABLET | Freq: Two times a day (BID) | ORAL | Status: DC
  Administered 2024-11-17 – 2024-11-21 (×8): 5 mg via ORAL
  Filled 2024-11-17 (×8): qty 1

## 2024-11-17 MED ORDER — PROCHLORPERAZINE EDISYLATE 10 MG/2ML IJ SOLN: 5.0000 mg | Freq: Four times a day (QID) | INTRAMUSCULAR | Status: DC | PRN

## 2024-11-17 MED ORDER — METHYLPHENIDATE HCL ER 10 MG PO TBCR: 10.0000 mg | EXTENDED_RELEASE_TABLET | Freq: Every morning | ORAL | Status: DC

## 2024-11-17 MED ORDER — KETOROLAC TROMETHAMINE 15 MG/ML IJ SOLN
15.0000 mg | Freq: Three times a day (TID) | INTRAMUSCULAR | Status: AC | PRN
Start: 2024-11-17 — End: 2024-11-19
  Administered 2024-11-17 – 2024-11-19 (×4): 15 mg via INTRAVENOUS
  Filled 2024-11-17 (×4): qty 1

## 2024-11-17 MED ORDER — LEVOTHYROXINE SODIUM 88 MCG PO TABS
88.0000 ug | ORAL_TABLET | Freq: Every day | ORAL | Status: DC
  Administered 2024-11-17 – 2024-11-21 (×4): 88 ug via ORAL
  Filled 2024-11-17 (×5): qty 1

## 2024-11-17 MED ORDER — ALBUTEROL SULFATE (2.5 MG/3ML) 0.083% IN NEBU: 3.0000 mL | INHALATION_SOLUTION | Freq: Four times a day (QID) | RESPIRATORY_TRACT | Status: DC | PRN

## 2024-11-17 MED ORDER — POLYETHYLENE GLYCOL 3350 17 G PO PACK: 17.0000 g | PACK | Freq: Every day | ORAL | Status: DC | PRN

## 2024-11-17 MED ORDER — ENOXAPARIN SODIUM 40 MG/0.4ML IJ SOSY
40.0000 mg | PREFILLED_SYRINGE | INTRAMUSCULAR | Status: DC
  Administered 2024-11-17 – 2024-11-21 (×5): 40 mg via SUBCUTANEOUS
  Filled 2024-11-17 (×5): qty 0.4

## 2024-11-17 MED ORDER — SCOPOLAMINE 1 MG/3DAYS TD PT72
1.0000 | MEDICATED_PATCH | TRANSDERMAL | Status: DC
  Filled 2024-11-17: qty 1

## 2024-11-17 NOTE — Plan of Care (Signed)

## 2024-11-17 NOTE — H&P (Addendum)
 History and Physical  Sharon Rowland FMW:[id number] DOB: 09-Aug-1986 DOA: 11/16/2024  Referring physician: Shermon pierce, PA-EDP  PCP: Myra Geni ORN, FNP  Outpatient Specialists: General Surgery, dermatology, neurology. Patient coming from: Home.  Chief Complaint: Intractable nausea and vomiting.  HPI: Sharon Rowland is a 38 y.o. female with medical history significant for gastroparesis, GERD, chronic abdominal pain, Erlers Danlos syndrome, asthma, hypothyroidism, mast cell activation syndrome, POTS, lupus, Sjogren syndrome, status post lap chole, states she has had a total of 25 surgeries, who presents to the ER due to intermittent abdominal pain for the past 3 weeks associated with intractable nausea and vomiting for the past 2 days.  Since October she has lost 12 pounds in result of this.  Her last bowel movement was yesterday.  Denies any subjective fevers or chills.    In the ER, tachycardic and tachypneic.  UA negative for pyuria.  Hypovolemic on exam.  Received 1 L IV fluid NS x 1, IV analgesics, and IV antiemetics.  CT abdomen pelvis with contrast revealed liquid stool in the colon, possibly indicating viral enterocolitis.  Due to her poor oral intake and concern for dehydration, EDP requesting admission for IV fluids and pain control.  Admitted by [facility], hospitalist service.   ED Course: Temperature 97.5.  BP 95/54, pulse 75, respiration rate 16, O2 saturation 99% on room air.  Review of Systems: Review of systems as noted in the HPI. All other systems reviewed and are negative.   Past Medical History:  Diagnosis Date   Allergic rhinitis    Anemia    Arthritis    Asthma    last used inhaler 1 month ago   Complication of anesthesia    if given fentanyl  has severe itching, rash and wheezing   Cubital tunnel syndrome on left 07/2023   Depression    Dysphagia    Dysrhythmia    SVT   Ehlers-Danlos syndrome    Female bladder prolapse    Gastritis    GERD  (gastroesophageal reflux disease)    with pregnancy only   Hashimoto's thyroiditis    Hypothyroidism    Lupus    Mast cell activation syndrome    Migraines    Osteoarthritis    Pelvic pain in antepartum period in second trimester 01/17/2014   Pneumonia    POTS (postural orthostatic tachycardia syndrome)    Pregnant 08/01/2015   Psoriatic arthritis (HCC)    PSVT (paroxysmal supraventricular tachycardia)    could not be confirmed with 2016 study   Rectocele    Sjogren's syndrome    Thoracic outlet syndrome    Thyroid  disease    Urethral prolapse    Uterine prolapse    Vaginal Pap smear, abnormal    Past Surgical History:  Procedure Laterality Date   ANKLE SURGERY Bilateral    2023 L ankle debridement and internal brace 2024 R ankle debridement and internal brace   ANTERIOR AND POSTERIOR VAGINAL REPAIR     ANTERIOR INTEROSSEOUS NERVE DECOMPRESSION Left 08/13/2023   Procedure: SUBCUTANEOUS ANTERIOR TRANSPOSITION OF ULNAR NERVE AT LEFT ELBOW;  Surgeon: Edie Norleen PARAS, MD;  Location: [facility] ORS;  Service: Orthopedics;  Laterality: Left;   BREAST REDUCTION SURGERY Bilateral 08/20/2022   Procedure: MAMMARY REDUCTION  (BREAST) with Liposuction;  Surgeon: Marene Sieving, MD;  Location: [facility] OR;  Service: Plastics;  Laterality: Bilateral;  3 hours   COLONOSCOPY WITH ESOPHAGOGASTRODUODENOSCOPY (EGD)  12/26/2022   CYSTOCELE REPAIR N/A 09/17/2017   Procedure: ANTERIOR REPAIR (CYSTOCELE);  Surgeon:  Jayne Vonn DEL, MD;  Location: [facility] ORS;  Service: Gynecology;  Laterality: N/A;   KNEE SURGERY Left 11/2021   KNEE SURGERY Right 2023   LABIOPLASTY N/A 03/24/2024   Procedure: LABIAPLASTY, VULVA;  Surgeon: Jayne Vonn DEL, MD;  Location: [facility] ORS;  Service: Gynecology;  Laterality: N/A;   LIPOSUCTION  03/2022   PANNICULECTOMY N/A 04/28/2023   Procedure: PANNICULECTOMY;  Surgeon: Waddell Leonce NOVAK, MD;  Location: [facility] OR;  Service: Plastics;  Laterality: N/A;   SALPINGO-OOPHORECTOMY, BILATERAL, ROBOT  ASSISTED, LAPAROSCOPIC Bilateral 03/24/2024   Procedure: SALPINGO-OOPHORECTOMY, BILATERAL, ROBOT ASSISTED, LAPAROSCOPIC, D5;  Surgeon: Jayne Vonn DEL, MD;  Location: [facility];  Service: Gynecology;  Laterality: Bilateral;   SUPRAVENTRICULAR TACHYCARDIA ABLATION  01/05/2015   unsuccessful   SUPRAVENTRICULAR TACHYCARDIA ABLATION N/A 01/05/2015   Procedure: SUPRAVENTRICULAR TACHYCARDIA ABLATION;  Surgeon: Danelle LELON Waddell, MD;  Location: [facility] CATH LAB;  Service: Cardiovascular;  Laterality: N/A;   Thoracic outlet decompression  2024   VAGINAL HYSTERECTOMY N/A 09/17/2017   Procedure: HYSTERECTOMY VAGINAL WITH POSSIBLE BILATERAL SALPINGECTOMY;  Surgeon: Jayne Vonn DEL, MD;  Location: [facility];  Service: Gynecology;  Laterality: N/A;   WRIST GANGLION EXCISION Left    x2    Social History:  reports that she has never smoked. She has never been exposed to tobacco smoke. She has never used smokeless tobacco. She reports that she does not drink alcohol and does not use drugs.   Allergies  Allergen Reactions   Apremilast Dermatitis, Rash and Other (See Comments)    Mood changes Hallucination DMAR  Pt stated this med makes her depression worse, makes her angry.   Cosentyx [Secukinumab] Anaphylaxis   Fentanyl  Itching, Dermatitis, Rash and Other (See Comments)    Wheezing    Hydroxychloroquine Palpitations and Other (See Comments)    Prolonged QT Irregular heart rate   Metoclopramide  Other (See Comments)    DO NOT GIVE VIA IV (can take PO) Causes Tremors, Jerking, Tachycardia   Other Rash    Lidocaine  Patches Adhesive Burn Scar Skin    Prochlorperazine  Other (See Comments)    DO NOT GIVE VIA IV  (can take PO) Causes Tremors, Jerking, Tachycardia   Semaglutide Dermatitis   Silver  Hives, Dermatitis and Rash   Tape Hives, Swelling and Rash    Use paper tape Angioedema   Latex Dermatitis   Zofran  [Ondansetron  Hcl] Other (See Comments)    Prolonged QT   Ezetimibe Nausea And Vomiting and Nausea Only     Family History  Problem Relation Age of Onset   Hypertension Mother    Hyperlipidemia Mother    Multiple sclerosis Mother    COPD Mother    Cancer Mother        melanoma x 2   Diabetes Mother    Heart disease Father        PSVT   Hyperlipidemia Brother    Hypertension Brother    Heart disease Maternal Grandfather        heart attack   Cancer Paternal Grandmother        pancreatic cancer   Heart disease Paternal Grandmother        MI   Hyperlipidemia Paternal Grandmother    Hypertension Paternal Grandmother    Asthma Daughter    Asthma Son    Autism Son    ADD / ADHD Son    Tourette syndrome Son    Other Son        Neurological and mental issues   ADD / ADHD  Son       Prior to Admission medications   Medication Sig Start Date End Date Taking? Authorizing Provider  albuterol  (VENTOLIN  HFA) 108 (90 Base) MCG/ACT inhaler Inhale 1-2 puffs into the lungs every 6 (six) hours as needed for wheezing or shortness of breath.    [provider]  Atogepant  (QULIPTA ) 60 MG TABS Take 60 mg by mouth daily.    [provider]  betamethasone dipropionate 0.05 % cream Apply 1 Application topically 2 (two) times daily as needed (psoriasis).    [provider]  botulinum toxin Type A (BOTOX) 200 units injection Inject 200 Units as directed every 3 (three) months.    [provider]  buPROPion  (WELLBUTRIN  XL) 300 MG 24 hr tablet Take 1 tablet by mouth daily. 09/05/23   Jayne Vonn DEL, MD  calcium  carbonate (TUMS EX) 750 MG chewable tablet Chew 1 tablet by mouth as needed for heartburn.    [provider]  cholecalciferol (VITAMIN D3) 25 MCG (1000 UNIT) tablet Take 1,000 Units by mouth daily.    [provider]  clobetasol cream (TEMOVATE) 0.05 % Apply 1 Application topically daily as needed (psoriasis).    [provider]  desonide (DESOWEN) 0.05 % ointment Apply 1 application  topically 2 (two) times daily as needed (psoriasis).  04/24/20   [provider]  diclofenac  (VOLTAREN ) 50 MG EC tablet Take 2 tablets by mouth twice daily. 09/05/23   Jayne Vonn DEL, MD  docusate sodium  (COLACE) 100 MG capsule Take 1 capsule (100 mg total) by mouth 2 (two) times daily. 01/19/24 01/18/25  Pappayliou, Dorothyann A, DO  estradiol  (ESTRACE ) 0.1 MG/GM vaginal cream 1 gram at bedtime 07/02/24   Jayne Vonn DEL, MD  estradiol  (VIVELLE -DOT) 0.1 MG/24HR patch Place 1 patch (0.1 mg total) onto the skin 2 (two) times a week. 05/06/24   Jayne Vonn DEL, MD  FIBER PO Take 20 mg by mouth daily.    [provider]  Guselkumab (TREMFYA) 100 MG/ML SOPN Inject 1 Dose into the skin every 30 (thirty) days.    [provider]  levothyroxine  (SYNTHROID ) 88 MCG tablet Take 88 mcg by mouth daily before breakfast. 10/28/22   [provider]  methylphenidate  10 MG ER tablet TAKE ONE TABLET BY MOUTH IN THE MORNING 04/18/23   Jayne Vonn DEL, MD  Multiple Vitamin (MULTIVITAMIN WITH MINERALS) TABS tablet Take 1 tablet by mouth daily.    [provider]  nystatin cream (MYCOSTATIN) Apply 1 Application topically 2 (two) times daily as needed for dry skin (yeast).    [provider]  pantoprazole  (PROTONIX ) 40 MG tablet Take 1 tablet (40 mg total) by mouth 2 (two) times daily. 11/02/24 12/02/24  Roark Rush, MD  pregabalin  (LYRICA ) 100 MG capsule Take 1 capsule (100 mg total) by mouth 2 (two) times daily. Patient taking differently: Take 100 mg by mouth daily. 10/23/22   Jayne Vonn DEL, MD  PREMARIN  vaginal cream Insert 1 gram vaginally nightly. 05/02/24   Jayne Vonn DEL, MD  promethazine  (PHENERGAN ) 25 MG suppository Place 1 suppository (25 mg total) rectally every 6 (six) hours as needed for nausea or vomiting. 05/23/24   Daralene Lonni BIRCH, PA-C  promethazine  (PHENERGAN ) 25 MG tablet Take 1 tablet (25 mg total) by mouth every 6 (six) hours as needed for nausea or vomiting. 05/23/24   Daralene Lonni BIRCH, PA-C  scopolamine   (TRANSDERM-SCOP) 1 MG/3DAYS Place 1 patch (1.5 mg total) onto the skin every 3 (three) days.  01/25/24   Cleotilde Rogue, MD    Physical Exam: BP (!) 95/54 (BP Location: Right Arm)   Pulse 75   Temp (!) 97.5 F (36.4 C) (Oral)   Resp 16   Ht 5' 5 (1.651 m)   Wt 65.8 kg   LMP 10/23/2016 (Approximate)   SpO2 99%   BMI 24.13 kg/m   General: 38 y.o. year-old female well developed well nourished in no acute distress.  Alert and oriented x3. Cardiovascular: Regular rate and rhythm with no rubs or gallops.  No thyromegaly or JVD noted.  No lower extremity edema. 2/4 pulses in all 4 extremities. Respiratory: Clear to auscultation with no wheezes or rales. Good inspiratory effort. Abdomen: Soft diffusely tender nondistended with normal bowel sounds x4 quadrants. Muskuloskeletal: No cyanosis, clubbing or edema noted bilaterally Neuro: CN II-XII intact, strength, sensation, reflexes Skin: No ulcerative lesions noted or rashes Psychiatry: Judgement and insight appear normal. Mood is appropriate for condition and setting          Labs on Admission:  Basic Metabolic Panel: Recent Labs  Lab 11/16/24 1950  NA 139  K 3.5  CL 102  CO2 26  GLUCOSE 97  BUN <5*  CREATININE 0.71  CALCIUM  9.5   Liver Function Tests: Recent Labs  Lab 11/16/24 1950  AST <10*  ALT 13  ALKPHOS 85  BILITOT 0.4  PROT 7.4  ALBUMIN 4.9   Recent Labs  Lab 11/16/24 1950  LIPASE 19   No results for input(s): AMMONIA in the last 168 hours. CBC: Recent Labs  Lab 11/16/24 1950  WBC 8.5  NEUTROABS 4.8  HGB 13.0  HCT 38.7  MCV 80.8  PLT 323   Cardiac Enzymes: No results for input(s): CKTOTAL, CKMB, CKMBINDEX, TROPONINI in the last 168 hours.  BNP (last 3 results) No results for input(s): BNP in the last 8760 hours.  ProBNP (last 3 results) No results for input(s): PROBNP in the last 8760 hours.  CBG: No results for input(s): GLUCAP in the last 168 hours.  Radiological Exams on  Admission: CT ABDOMEN PELVIS W CONTRAST Result Date: 11/16/2024 EXAM: CT ABDOMEN AND PELVIS WITH CONTRAST 11/16/2024 09:01:17 PM TECHNIQUE: CT of the abdomen and pelvis was performed with the administration of 100 mL of iohexol  (OMNIPAQUE ) 300 MG/ML solution. Multiplanar reformatted images are provided for review. Automated exposure control, iterative reconstruction, and/or weight-based adjustment of the mA/kV was utilized to reduce the radiation dose to as low as reasonably achievable. COMPARISON: Comparison with 09/11/2024. CLINICAL HISTORY: Abdominal pain, acute, nonlocalized. FINDINGS: LOWER CHEST: Mild dependent atelectasis in the lung bases. LIVER: Mild diffuse fatty infiltration of the liver. GALLBLADDER AND BILE DUCTS: Surgical absence of the gallbladder. No biliary ductal dilatation. SPLEEN: No acute abnormality. PANCREAS: No acute abnormality. ADRENAL GLANDS: No acute abnormality. KIDNEYS, URETERS AND BLADDER: No stones in the kidneys or ureters. No hydronephrosis. No perinephric or periureteral stranding. Urinary bladder is unremarkable. GI AND BOWEL: Stomach demonstrates no acute abnormality. Liquid stool in the colon. There is no bowel obstruction. APPENDIX: The appendix is normal. PERITONEUM AND RETROPERITONEUM: No ascites. No free air. VASCULATURE: Aorta is normal in caliber. LYMPH NODES: No lymphadenopathy. REPRODUCTIVE ORGANS: The uterus is surgically absent. BONES AND SOFT TISSUES: No acute osseous abnormality. No focal soft tissue abnormality. IMPRESSION: 1. Liquid stool in the colon, possibly indicating viral enterocolitis. 2. No evidence of obstruction. Electronically signed by: Elsie Gravely MD 11/16/2024 09:08 PM EST RP Workstation: [id number]    EKG: I independently viewed the EKG done  and my findings are as followed: Sinus rhythm rate of 82.  QTc 476.  Assessment/Plan Present on Admission:  Intractable nausea and vomiting  Principal Problem:   Intractable nausea and  vomiting  Intractable nausea and vomiting in the setting of gastroparesis Resume home regimen. Continue supportive care IV fluid hydration IV antiemetics  Chronic abdominal pain, in the setting of multiple intra-abdominal surgeries. Continue pain control Continue IV fluid hydration Continue to optimize magnesium and potassium levels No evidence of bowel obstruction. Last bowel movement was yesterday, bowel sounds present on exam CT abdomen pelvis with contrast revealed liquid stool in the colon, possibly indicating viral enterocolitis.  Hypothyroidism Resume home levothyroxine   GERD Resume home PPI  Polyneuropathy Resume home Lyrica   Lupus, Ehlers-Danlos syndrome, asthma mast activation syndrome POTS, Sjogren syndrome Noted, appear stable.    Time: 75 minutes.    DVT prophylaxis: Subcu Lovenox  daily.  Code Status: Full code.  Family Communication: None at bedside.  Disposition Plan: Admitted to MedSurg unit  Consults called: None.  Admission status: Observation status   Status is: Observation    Terry LOISE Hurst MD [facility] Pager [phone]  If 7PM-7AM, please contact night-coverage [email or web address] Password [email or web address]  11/17/2024, 5:12 AM

## 2024-11-17 NOTE — TOC CM/SW Note (Signed)
 Transition of Care [facility]) - Inpatient Brief Assessment   Patient Details  Name: Sharon Rowland MRN: [id number] Date of Birth: Nov 06, 1986  [facility] CM/SW Contact:    Noreen KATHEE Pinal, LCSWA Phone Number: 11/17/2024, 10:10 AM   Clinical Narrative:  Inpatient Care Management (ICM) has reviewed patient and no other ICM needs have been identified at this time. We will continue to monitor patient advancement through interdisciplinary progression rounds. If new patient transition needs arise, please place a ICM consult.  Transition of Care Asessment: Insurance and Status: Insurance coverage has been reviewed Patient has primary care physician: Yes Home environment has been reviewed: From Home Prior level of function:: Independent Prior/Current Home Services: No current home services Social Drivers of Health Review: SDOH reviewed no interventions necessary Readmission risk has been reviewed: Yes Transition of care needs: no transition of care needs at this time

## 2024-11-17 NOTE — Progress Notes (Signed)
 PROGRESS NOTE    Sharon Rowland  FMW:[id number] DOB: 25-Aug-1986 DOA: 11/16/2024 PCP: Myra Geni ORN, FNP   Brief Narrative:   Sharon Rowland is a 38 y.o. female with medical history significant for gastroparesis, GERD, chronic abdominal pain, Ehlers Danlos syndrome, asthma, hypothyroidism, mast cell activation syndrome, POTS, lupus, Sjogren syndrome, status post lap chole, states she has had a total of 25 surgeries, who presents to the ER due to intermittent abdominal pain for the past 3 weeks associated with intractable nausea and vomiting for the past 2 days.  Since October she has lost 12 pounds in result of this.  No fever or chills. In the ER patient was tachycardic, tachypneic and hypervolemic on exam.  UA negative for pyuria.  Patient received IV fluid bolus, IV analgesics and IV antibiotics. CT abdomen pelvis with contrast revealed liquid stool in the colon, possibly indicating viral enterocolitis.  Patient admitted for hydration, symptom management with ongoing nausea and vomiting.   Assessment & Plan:   Principal Problem:   Intractable nausea and vomiting Active Problems:   Paroxysmal SVT (supraventricular tachycardia) (HCC)   Mast cell activation syndrome   Ehlers-Danlos syndrome   Chronic pain   Intractable nausea and vomiting in the setting of gastroparesis Continue supportive care IV fluid hydration will be continued, decrease LR to 75 cc/h Continue scopolamine  patch Patient says Benadryl  helps, will order IV Benadryl  Continue with liquid diet for now Continue to follow-up with GI outpatient  Chronic abdominal pain, in the setting of multiple intra-abdominal surgeries. Continue pain control.  Will add IV Toradol , IV Tylenol  per patient's request. Continue IV fluid hydration Continue to optimize magnesium and potassium levels No evidence of bowel obstruction. CT abdomen pelvis with contrast revealed liquid stool in the colon, possibly indicating viral  enterocolitis. No diarrhea.   Hypothyroidism Resume home levothyroxine    GERD Resume home PPI   Polyneuropathy Resume home Lyrica    Lupus, Ehlers-Danlos syndrome, asthma mast activation syndrome POTS, Sjogren syndrome Noted, appear stable.     DVT prophylaxis: Lovenox  subcu Code Status: Full code Family Communication: None at the bedside Disposition Plan: MedSurg Status is: Observation The patient remains OBS appropriate and will d/c before 2 midnights.   Subjective:  Patient seen and examined at the bedside.  She reports of ongoing symptoms of nausea.  She states she had some Jell-O.  Also reports of generalized pain from  arthritis.  She also states she cannot tolerate Reglan , Compazine  and Zofran  for  extrapyramidal symptoms as well as prolonged QT.  She says she uses a scopolamine  patch as well as rectal promethazine  as needed  Objective: Vitals:   11/16/24 2318 11/16/24 2344 11/17/24 0408 11/17/24 0906  BP:  104/84 (!) 95/54 114/81  Pulse:  79 75 84  Resp:   16 16  Temp: 97.6 F (36.4 C) (!) 97.4 F (36.3 C) (!) 97.5 F (36.4 C) (!) 97.5 F (36.4 C)  TempSrc: Oral Oral Oral Oral  SpO2:   99% 100%  Weight:      Height:       No intake or output data in the 24 hours ending 11/17/24 1107 Filed Weights   11/16/24 1822  Weight: 65.8 kg    Examination:  General exam: Appears calm and comfortable  Respiratory system: Bilateral decreased breath sounds at bases Cardiovascular system: S1 & S2 heard, Rate controlled Gastrointestinal system: Abdomen is nondistended, soft and nontender. Normal bowel sounds heard. Extremities: No cyanosis, clubbing, edema  Central nervous system: Alert and  oriented. No focal neurological deficits. Moving extremities Skin: No rashes, lesions or ulcers Psychiatry: Judgement and insight appear normal. Mood & affect appropriate.     Data Reviewed: I have personally reviewed following labs and imaging studies  CBC: Recent Labs   Lab 11/16/24 1950 11/17/24 0540  WBC 8.5 6.3  NEUTROABS 4.8  --   HGB 13.0 11.4*  HCT 38.7 35.3*  MCV 80.8 83.3  PLT 323 273   Basic Metabolic Panel: Recent Labs  Lab 11/16/24 1930 11/16/24 1950 11/17/24 0540  NA  --  139 142  K  --  3.5 3.6  CL  --  102 108  CO2  --  26 26  GLUCOSE  --  97 92  BUN  --  <5* <5*  CREATININE  --  0.71 0.71  CALCIUM   --  9.5 8.6*  MG 2.5*  --   --   PHOS 2.6  --   --    GFR: Estimated Creatinine Clearance: 85.8 mL/min (by C-G formula based on SCr of 0.71 mg/dL). Liver Function Tests: Recent Labs  Lab 11/16/24 1950  AST <10*  ALT 13  ALKPHOS 85  BILITOT 0.4  PROT 7.4  ALBUMIN 4.9   Recent Labs  Lab 11/16/24 1950  LIPASE 19   No results for input(s): AMMONIA in the last 168 hours. Coagulation Profile: No results for input(s): INR, PROTIME in the last 168 hours. Cardiac Enzymes: No results for input(s): CKTOTAL, CKMB, CKMBINDEX, TROPONINI in the last 168 hours. BNP (last 3 results) No results for input(s): PROBNP in the last 8760 hours. HbA1C: No results for input(s): HGBA1C in the last 72 hours. CBG: No results for input(s): GLUCAP in the last 168 hours. Lipid Profile: No results for input(s): CHOL, HDL, LDLCALC, TRIG, CHOLHDL, LDLDIRECT in the last 72 hours. Thyroid  Function Tests: No results for input(s): TSH, T4TOTAL, FREET4, T3FREE, THYROIDAB in the last 72 hours. Anemia Panel: No results for input(s): VITAMINB12, FOLATE, FERRITIN, TIBC, IRON, RETICCTPCT in the last 72 hours. Sepsis Labs: No results for input(s): PROCALCITON, LATICACIDVEN in the last 168 hours.  No results found for this or any previous visit (from the past 240 hours).       Radiology Studies: CT ABDOMEN PELVIS W CONTRAST Result Date: 11/16/2024 EXAM: CT ABDOMEN AND PELVIS WITH CONTRAST 11/16/2024 09:01:17 PM TECHNIQUE: CT of the abdomen and pelvis was performed with the administration  of 100 mL of iohexol  (OMNIPAQUE ) 300 MG/ML solution. Multiplanar reformatted images are provided for review. Automated exposure control, iterative reconstruction, and/or weight-based adjustment of the mA/kV was utilized to reduce the radiation dose to as low as reasonably achievable. COMPARISON: Comparison with 09/11/2024. CLINICAL HISTORY: Abdominal pain, acute, nonlocalized. FINDINGS: LOWER CHEST: Mild dependent atelectasis in the lung bases. LIVER: Mild diffuse fatty infiltration of the liver. GALLBLADDER AND BILE DUCTS: Surgical absence of the gallbladder. No biliary ductal dilatation. SPLEEN: No acute abnormality. PANCREAS: No acute abnormality. ADRENAL GLANDS: No acute abnormality. KIDNEYS, URETERS AND BLADDER: No stones in the kidneys or ureters. No hydronephrosis. No perinephric or periureteral stranding. Urinary bladder is unremarkable. GI AND BOWEL: Stomach demonstrates no acute abnormality. Liquid stool in the colon. There is no bowel obstruction. APPENDIX: The appendix is normal. PERITONEUM AND RETROPERITONEUM: No ascites. No free air. VASCULATURE: Aorta is normal in caliber. LYMPH NODES: No lymphadenopathy. REPRODUCTIVE ORGANS: The uterus is surgically absent. BONES AND SOFT TISSUES: No acute osseous abnormality. No focal soft tissue abnormality. IMPRESSION: 1. Liquid stool in the colon, possibly indicating viral  enterocolitis. 2. No evidence of obstruction. Electronically signed by: Elsie Gravely MD 11/16/2024 09:08 PM EST RP Workstation: [id number]        Scheduled Meds:  alum & mag hydroxide-simeth  30 mL Oral Once   Atogepant   60 mg Oral Daily   buPROPion   300 mg Oral Daily   enoxaparin  (LOVENOX ) injection  40 mg Subcutaneous Q24H   levothyroxine   88 mcg Oral Q0600   methylphenidate   5 mg Oral BID WC   multivitamin with minerals  1 tablet Oral Daily   pantoprazole   40 mg Oral BID   pregabalin   100 mg Oral Daily   scopolamine   1 patch Transdermal Q72H   Continuous Infusions:   acetaminophen      lactated ringers  100 mL/hr at 11/17/24 0540          Derryl Duval, MD [facility] 11/17/2024, 11:07 AM

## 2024-11-17 NOTE — Progress Notes (Signed)
 Patient stated that she sometimes Self Caths. Dr Shona was contacted and bladder scan was performed scan showed 293 patient stated that she didn't understand the need for a bladder scan and it was a waste of time. It was explained to her precautions were being taken to ensure she wasn't retaining urine. Patient could not give a definite answer as to when she last cathed. Patient states she can sometimes urinate normally.

## 2024-11-18 DIAGNOSIS — R112 Nausea with vomiting, unspecified: Secondary | ICD-10-CM | POA: Diagnosis not present

## 2024-11-18 MED ORDER — SCOPOLAMINE 1 MG/3DAYS TD PT72
1.0000 | MEDICATED_PATCH | TRANSDERMAL | Status: DC
  Administered 2024-11-18: 1 mg via TRANSDERMAL
  Filled 2024-11-18: qty 1

## 2024-11-18 MED ORDER — ACETAMINOPHEN 10 MG/ML IV SOLN
1000.0000 mg | Freq: Once | INTRAVENOUS | Status: AC
  Administered 2024-11-18: 1000 mg via INTRAVENOUS
  Filled 2024-11-18 (×2): qty 100

## 2024-11-18 MED ORDER — DEXAMETHASONE SODIUM PHOSPHATE 4 MG/ML IJ SOLN
4.0000 mg | Freq: Once | INTRAMUSCULAR | Status: AC
  Administered 2024-11-18: 4 mg via INTRAVENOUS
  Filled 2024-11-18: qty 1

## 2024-11-18 MED ORDER — DIPHENHYDRAMINE HCL 50 MG/ML IJ SOLN
50.0000 mg | Freq: Three times a day (TID) | INTRAMUSCULAR | Status: DC | PRN
  Administered 2024-11-18 – 2024-11-21 (×8): 50 mg via INTRAVENOUS
  Filled 2024-11-18 (×8): qty 1

## 2024-11-18 MED ORDER — ESTRADIOL 0.025 MG/24HR TD PTWK
0.0500 mg | MEDICATED_PATCH | TRANSDERMAL | Status: DC
  Administered 2024-11-18: 0.05 mg via TRANSDERMAL
  Filled 2024-11-18: qty 1

## 2024-11-18 NOTE — Progress Notes (Signed)
 PROGRESS NOTE    Sharon Rowland  FMW:[id number] DOB: 04/16/1986 DOA: 11/16/2024 PCP: Myra Geni ORN, FNP   Brief Narrative:   Sharon Rowland is a 38 y.o. female with medical history significant for gastroparesis, GERD, chronic abdominal pain, Ehlers Danlos syndrome, asthma, hypothyroidism, mast cell activation syndrome, POTS, lupus, Sjogren syndrome, status post lap chole, states she has had a total of 25 surgeries, who presents to the ER due to intermittent abdominal pain for the past 3 weeks associated with intractable nausea and vomiting for the past 2 days.  Since October she has lost 12 pounds in result of this.  No fever or chills. In the ER patient was tachycardic, tachypneic and hypervolemic on exam.  UA negative for pyuria.  Patient received IV fluid bolus, IV analgesics and IV antibiotics. CT abdomen pelvis with contrast revealed liquid stool in the colon, possibly indicating viral enterocolitis.  Patient admitted for hydration, symptom management with ongoing nausea and vomiting.   Assessment & Plan:   Principal Problem:   Intractable nausea and vomiting Active Problems:   Paroxysmal SVT (supraventricular tachycardia) (HCC)   Mast cell activation syndrome   Ehlers-Danlos syndrome   Chronic pain   Intractable nausea and vomiting in the setting of gastroparesis Continue supportive care S/p IV fluid hydration  Continue scopolamine  patch Patient says Benadryl  helps, will give IV Benadryl  to 50 every 8 hours as needed. Patient requested advancement, will start GI soft. Continue to follow-up with GI outpatient  Chronic abdominal pain, in the setting of multiple intra-abdominal surgeries. Continue pain control.  Will add IV Toradol , IV Tylenol  per patient's request. Continue IV fluid hydration Continue to optimize magnesium and potassium levels No evidence of bowel obstruction. CT abdomen pelvis with contrast revealed liquid stool in the colon, possibly indicating viral  enterocolitis. No diarrhea.   Hypothyroidism Resume home levothyroxine    GERD Resume home PPI   Polyneuropathy Resume home Lyrica    Lupus, Ehlers-Danlos syndrome, asthma mast activation syndrome POTS, Sjogren syndrome Noted, appear stable.     DVT prophylaxis: Lovenox  subcu Code Status: Full code Family Communication: None at the bedside Disposition Plan: MedSurg Status is: Observation The patient remains OBS appropriate and will d/c before 2 midnights.   Subjective:  Patient seen and examined at the bedside.  She reports some improvement in vomiting after Benadryl  increased to 50 mg yesterday.  She is willing to try soft diet.  Vital signs are stable.  Toradol  is helping with pain.  Objective: Vitals:   11/17/24 0906 11/17/24 1250 11/17/24 1927 11/18/24 0418  BP: 114/81 127/89 105/76 121/72  Pulse: 84 88 80 66  Resp: 16  18 18   Temp: (!) 97.5 F (36.4 C) (!) 97.3 F (36.3 C) 97.7 F (36.5 C) (!) 97.5 F (36.4 C)  TempSrc: Oral Oral Oral Oral  SpO2: 100% 94% 98% 98%  Weight:      Height:        Intake/Output Summary (Last 24 hours) at 11/18/2024 1143 Last data filed at 11/17/2024 1629 Gross per 24 hour  Intake 1098.2 ml  Output --  Net 1098.2 ml   Filed Weights   11/16/24 1822  Weight: 65.8 kg    Examination:  General exam: Appears calm and comfortable  Respiratory system: Bilateral decreased breath sounds at bases Cardiovascular system: S1 & S2 heard, Rate controlled Gastrointestinal system: Abdomen is nondistended, soft and nontender. Normal bowel sounds heard. Extremities: No cyanosis, clubbing, edema  Central nervous system: Alert and oriented. No focal neurological deficits.  Moving extremities Skin: No rashes, lesions or ulcers Psychiatry: Judgement and insight appear normal. Mood & affect appropriate.     Data Reviewed: I have personally reviewed following labs and imaging studies  CBC: Recent Labs  Lab 11/16/24 1950 11/17/24 0540   WBC 8.5 6.3  NEUTROABS 4.8  --   HGB 13.0 11.4*  HCT 38.7 35.3*  MCV 80.8 83.3  PLT 323 273   Basic Metabolic Panel: Recent Labs  Lab 11/16/24 1930 11/16/24 1950 11/17/24 0540  NA  --  139 142  K  --  3.5 3.6  CL  --  102 108  CO2  --  26 26  GLUCOSE  --  97 92  BUN  --  <5* <5*  CREATININE  --  0.71 0.71  CALCIUM   --  9.5 8.6*  MG 2.5*  --   --   PHOS 2.6  --   --    GFR: Estimated Creatinine Clearance: 85.8 mL/min (by C-G formula based on SCr of 0.71 mg/dL). Liver Function Tests: Recent Labs  Lab 11/16/24 1950  AST <10*  ALT 13  ALKPHOS 85  BILITOT 0.4  PROT 7.4  ALBUMIN 4.9   Recent Labs  Lab 11/16/24 1950  LIPASE 19   No results for input(s): AMMONIA in the last 168 hours. Coagulation Profile: No results for input(s): INR, PROTIME in the last 168 hours. Cardiac Enzymes: No results for input(s): CKTOTAL, CKMB, CKMBINDEX, TROPONINI in the last 168 hours. BNP (last 3 results) No results for input(s): PROBNP in the last 8760 hours. HbA1C: No results for input(s): HGBA1C in the last 72 hours. CBG: No results for input(s): GLUCAP in the last 168 hours. Lipid Profile: No results for input(s): CHOL, HDL, LDLCALC, TRIG, CHOLHDL, LDLDIRECT in the last 72 hours. Thyroid  Function Tests: No results for input(s): TSH, T4TOTAL, FREET4, T3FREE, THYROIDAB in the last 72 hours. Anemia Panel: No results for input(s): VITAMINB12, FOLATE, FERRITIN, TIBC, IRON, RETICCTPCT in the last 72 hours. Sepsis Labs: No results for input(s): PROCALCITON, LATICACIDVEN in the last 168 hours.  No results found for this or any previous visit (from the past 240 hours).       Radiology Studies: CT ABDOMEN PELVIS W CONTRAST Result Date: 11/16/2024 EXAM: CT ABDOMEN AND PELVIS WITH CONTRAST 11/16/2024 09:01:17 PM TECHNIQUE: CT of the abdomen and pelvis was performed with the administration of 100 mL of iohexol  (OMNIPAQUE )  300 MG/ML solution. Multiplanar reformatted images are provided for review. Automated exposure control, iterative reconstruction, and/or weight-based adjustment of the mA/kV was utilized to reduce the radiation dose to as low as reasonably achievable. COMPARISON: Comparison with 09/11/2024. CLINICAL HISTORY: Abdominal pain, acute, nonlocalized. FINDINGS: LOWER CHEST: Mild dependent atelectasis in the lung bases. LIVER: Mild diffuse fatty infiltration of the liver. GALLBLADDER AND BILE DUCTS: Surgical absence of the gallbladder. No biliary ductal dilatation. SPLEEN: No acute abnormality. PANCREAS: No acute abnormality. ADRENAL GLANDS: No acute abnormality. KIDNEYS, URETERS AND BLADDER: No stones in the kidneys or ureters. No hydronephrosis. No perinephric or periureteral stranding. Urinary bladder is unremarkable. GI AND BOWEL: Stomach demonstrates no acute abnormality. Liquid stool in the colon. There is no bowel obstruction. APPENDIX: The appendix is normal. PERITONEUM AND RETROPERITONEUM: No ascites. No free air. VASCULATURE: Aorta is normal in caliber. LYMPH NODES: No lymphadenopathy. REPRODUCTIVE ORGANS: The uterus is surgically absent. BONES AND SOFT TISSUES: No acute osseous abnormality. No focal soft tissue abnormality. IMPRESSION: 1. Liquid stool in the colon, possibly indicating viral enterocolitis. 2. No evidence of  obstruction. Electronically signed by: Elsie Gravely MD 11/16/2024 09:08 PM EST RP Workstation: [id number]        Scheduled Meds:  alum & mag hydroxide-simeth  30 mL Oral Once   Atogepant   60 mg Oral Daily   buPROPion   300 mg Oral Daily   enoxaparin  (LOVENOX ) injection  40 mg Subcutaneous Q24H   levothyroxine   88 mcg Oral Q0600   methylphenidate   5 mg Oral BID WC   multivitamin with minerals  1 tablet Oral Daily   pantoprazole  (PROTONIX ) IV  40 mg Intravenous Q12H   pregabalin   100 mg Oral Daily   scopolamine   1 patch Transdermal Q72H   Continuous  Infusions:          Yuriana Gaal, MD [facility] 11/18/2024, 11:43 AM

## 2024-11-18 NOTE — Significant Event (Signed)
       CROSS COVER NOTE  NAME: Sharon Rowland MRN: [id number] DOB : Sep 15, 1986 ATTENDING PHYSICIAN: Mcarthur Pick, MD    Date of Service   11/18/2024   HPI/Events of Note   [facility] Cross Cover at [facility] received from nurse regarding ongoing nausea and vomiting despite scopolamine  patch and IV benadryl . Also requested IV tylenol  for mild pain due to inability to tolerate po.  HPI: admitted with intractable nausea and vomiting secondary to gastroparesis. Also has history of GERD, chronic abdominal pain, Ehlers Danlos syndrome, asthma, hypothyroidism, mast cell activation syndrome, POTS, lupus, Sjogren syndrome, status post lap chole, and paroxysmal SVT  Interventions   Assessment/Plan: Blood pressure 121/83, pulse 97, temperature 98.9 F (37.2 C), temperature source Oral, resp. rate 16, height 5' 5 (1.651 m), weight 65.8 kg, SpO2 98%.  Decadron  4 mg IV ordered x 1 dose        Erminio LITTIE Cone NP [facility] Cross Cover 7pm-7am - check amion for availability Pager [phone]

## 2024-11-19 ENCOUNTER — Encounter (HOSPITAL_COMMUNITY): Payer: Self-pay | Admitting: Internal Medicine

## 2024-11-19 DIAGNOSIS — Q796 Ehlers-Danlos syndrome, unspecified: Secondary | ICD-10-CM | POA: Diagnosis not present

## 2024-11-19 DIAGNOSIS — R112 Nausea with vomiting, unspecified: Secondary | ICD-10-CM | POA: Diagnosis not present

## 2024-11-19 DIAGNOSIS — G894 Chronic pain syndrome: Secondary | ICD-10-CM | POA: Diagnosis not present

## 2024-11-19 MED ORDER — ACETAMINOPHEN 500 MG PO TABS
500.0000 mg | ORAL_TABLET | Freq: Four times a day (QID) | ORAL | Status: AC | PRN
  Administered 2024-11-19 – 2024-11-20 (×2): 500 mg via ORAL
  Filled 2024-11-19 (×2): qty 1

## 2024-11-19 MED ORDER — ENSURE PLUS HIGH PROTEIN PO LIQD
237.0000 mL | Freq: Two times a day (BID) | ORAL | Status: DC
  Administered 2024-11-19 – 2024-11-20 (×4): 237 mL via ORAL

## 2024-11-19 NOTE — Plan of Care (Signed)

## 2024-11-19 NOTE — Plan of Care (Signed)

## 2024-11-19 NOTE — Progress Notes (Signed)
 PROGRESS NOTE    Sharon Rowland  FMW:[id number] DOB: Oct 22, 1986 DOA: 11/16/2024 PCP: Myra Geni ORN, FNP   Brief Narrative:   Sharon Rowland is a 38 y.o. female with medical history significant for gastroparesis, GERD, chronic abdominal pain, Ehlers Danlos syndrome, asthma, hypothyroidism, mast cell activation syndrome, POTS, lupus, Sjogren syndrome, status post lap chole, states she has had a total of 25 surgeries, who presents to the ER due to intermittent abdominal pain for the past 3 weeks associated with intractable nausea and vomiting for the past 2 days.  Since October she has lost 12 pounds in result of this.  No fever or chills. In the ER patient was tachycardic, tachypneic and hypervolemic on exam.  UA negative for pyuria.  Patient received IV fluid bolus, IV analgesics and IV antibiotics. CT abdomen pelvis with contrast revealed liquid stool in the colon, possibly indicating viral enterocolitis.  Patient admitted for hydration, symptom management with ongoing nausea and vomiting.  Has been getting IV Benadryl , scopolamine  patch, IV Toradol  for pain.  No associated relief of symptoms.   Assessment & Plan:   Principal Problem:   Intractable nausea and vomiting Active Problems:   Paroxysmal SVT (supraventricular tachycardia) (HCC)   Mast cell activation syndrome   Ehlers-Danlos syndrome   Chronic pain   Intractable nausea and vomiting in the setting of gastroparesis Continue supportive care S/p IV fluid hydration  Continue scopolamine  patch Patient says Benadryl  helps, will give IV Benadryl  to 50 every 8 hours as needed. Symptoms are still intermittent.  Vomiting yesterday.  Will consult GI.  May need endoscopy. Will obtain follow-up labs per patient request.  Chronic abdominal pain, in the setting of multiple intra-abdominal surgeries. Continue pain control.  Will add IV Toradol , IV Tylenol  per patient's request. CT abdomen pelvis with contrast revealed liquid stool  in the colon, possibly indicating viral enterocolitis. No diarrhea. Consult GI as above. Per patient she had endoscopy about 2 years ago.  Takes diclofenac  daily   Hypothyroidism Resume home levothyroxine    GERD Resume home PPI   Polyneuropathy Resume home Lyrica    Lupus, Ehlers-Danlos syndrome, asthma mast activation syndrome POTS, Sjogren syndrome Noted, appear stable.     DVT prophylaxis: Lovenox  subcu Code Status: Full code Family Communication: Husband over speaker phone Disposition Plan: MedSurg Status is: Observation The patient remains OBS appropriate and will d/c before 2 midnights.   Subjective:  Patient seen and examined at the bedside.  Vital signs stable.  She says she threw up dinner yesterday.  Still has ongoing nausea, abdominal pain.  No fever or chills.  No bowel movements  Objective: Vitals:   11/18/24 0418 11/18/24 1259 11/18/24 2016 11/19/24 0517  BP: 121/72 118/81 121/83 96/62  Pulse: 66 100 97 69  Resp: 18  16 16   Temp: (!) 97.5 F (36.4 C) 97.9 F (36.6 C) 98.9 F (37.2 C) 97.8 F (36.6 C)  TempSrc: Oral Oral Oral Oral  SpO2: 98% 97% 98% 97%  Weight:      Height:        Intake/Output Summary (Last 24 hours) at 11/19/2024 1020 Last data filed at 11/19/2024 0300 Gross per 24 hour  Intake 480 ml  Output --  Net 480 ml   Filed Weights   11/16/24 1822  Weight: 65.8 kg    Examination:  General exam: Appears calm and comfortable  Respiratory system: Bilateral decreased breath sounds at bases Cardiovascular system: S1 & S2 heard, Rate controlled Gastrointestinal system: Abdomen is nondistended, mild.  Tenderness.  Normal bowel sounds heard. Extremities: No cyanosis, clubbing, edema  Central nervous system: Alert and oriented. No focal neurological deficits. Moving extremities Skin: No rashes, lesions or ulcers Psychiatry: Judgement and insight appear normal. Mood & affect appropriate.     Data Reviewed: I have personally  reviewed following labs and imaging studies  CBC: Recent Labs  Lab 11/16/24 1950 11/17/24 0540  WBC 8.5 6.3  NEUTROABS 4.8  --   HGB 13.0 11.4*  HCT 38.7 35.3*  MCV 80.8 83.3  PLT 323 273   Basic Metabolic Panel: Recent Labs  Lab 11/16/24 1930 11/16/24 1950 11/17/24 0540  NA  --  139 142  K  --  3.5 3.6  CL  --  102 108  CO2  --  26 26  GLUCOSE  --  97 92  BUN  --  <5* <5*  CREATININE  --  0.71 0.71  CALCIUM   --  9.5 8.6*  MG 2.5*  --   --   PHOS 2.6  --   --    GFR: Estimated Creatinine Clearance: 85.8 mL/min (by C-G formula based on SCr of 0.71 mg/dL). Liver Function Tests: Recent Labs  Lab 11/16/24 1950  AST <10*  ALT 13  ALKPHOS 85  BILITOT 0.4  PROT 7.4  ALBUMIN 4.9   Recent Labs  Lab 11/16/24 1950  LIPASE 19   No results for input(s): AMMONIA in the last 168 hours. Coagulation Profile: No results for input(s): INR, PROTIME in the last 168 hours. Cardiac Enzymes: No results for input(s): CKTOTAL, CKMB, CKMBINDEX, TROPONINI in the last 168 hours. BNP (last 3 results) No results for input(s): PROBNP in the last 8760 hours. HbA1C: No results for input(s): HGBA1C in the last 72 hours. CBG: No results for input(s): GLUCAP in the last 168 hours. Lipid Profile: No results for input(s): CHOL, HDL, LDLCALC, TRIG, CHOLHDL, LDLDIRECT in the last 72 hours. Thyroid  Function Tests: No results for input(s): TSH, T4TOTAL, FREET4, T3FREE, THYROIDAB in the last 72 hours. Anemia Panel: No results for input(s): VITAMINB12, FOLATE, FERRITIN, TIBC, IRON, RETICCTPCT in the last 72 hours. Sepsis Labs: No results for input(s): PROCALCITON, LATICACIDVEN in the last 168 hours.  No results found for this or any previous visit (from the past 240 hours).       Radiology Studies: No results found.       Scheduled Meds:  alum & mag hydroxide-simeth  30 mL Oral Once   Atogepant   60 mg Oral Daily    buPROPion   300 mg Oral Daily   enoxaparin  (LOVENOX ) injection  40 mg Subcutaneous Q24H   estradiol   0.05 mg Transdermal Weekly   feeding supplement  237 mL Oral BID BM   levothyroxine   88 mcg Oral Q0600   methylphenidate   5 mg Oral BID WC   multivitamin with minerals  1 tablet Oral Daily   pantoprazole  (PROTONIX ) IV  40 mg Intravenous Q12H   pregabalin   100 mg Oral Daily   scopolamine   1 patch Transdermal Q72H   Continuous Infusions:          Kyvon Hu, MD [facility] 11/19/2024, 10:20 AM

## 2024-11-20 ENCOUNTER — Inpatient Hospital Stay (HOSPITAL_COMMUNITY): Admitting: Anesthesiology

## 2024-11-20 ENCOUNTER — Encounter (HOSPITAL_COMMUNITY)
Admission: EM | Disposition: A | Discharge status: Home / Self Care | Payer: Self-pay | Source: Home / Self Care | Attending: Hospitalist

## 2024-11-20 DIAGNOSIS — R109 Unspecified abdominal pain: Secondary | ICD-10-CM

## 2024-11-20 DIAGNOSIS — R112 Nausea with vomiting, unspecified: Secondary | ICD-10-CM

## 2024-11-20 DIAGNOSIS — K2970 Gastritis, unspecified, without bleeding: Secondary | ICD-10-CM

## 2024-11-20 HISTORY — PX: ESOPHAGOGASTRODUODENOSCOPY: SHX5428

## 2024-11-20 LAB — CBC WITH DIFFERENTIAL/PLATELET
Abs Immature Granulocytes: 0.01 K/uL (ref 0.00–0.07)
Basophils Absolute: 0.1 K/uL (ref 0.0–0.1)
Basophils Relative: 1 %
Eosinophils Absolute: 0.1 K/uL (ref 0.0–0.5)
Eosinophils Relative: 2 %
HCT: 34.5 % — ABNORMAL LOW (ref 36.0–46.0)
Hemoglobin: 11.2 g/dL — ABNORMAL LOW (ref 12.0–15.0)
Immature Granulocytes: 0 %
Lymphocytes Relative: 58 %
Lymphs Abs: 3.8 K/uL (ref 0.7–4.0)
MCH: 27.1 pg (ref 26.0–34.0)
MCHC: 32.5 g/dL (ref 30.0–36.0)
MCV: 83.5 fL (ref 80.0–100.0)
Monocytes Absolute: 0.6 K/uL (ref 0.1–1.0)
Monocytes Relative: 9 %
Neutro Abs: 2 K/uL (ref 1.7–7.7)
Neutrophils Relative %: 30 %
Platelets: 271 K/uL (ref 150–400)
RBC: 4.13 MIL/uL (ref 3.87–5.11)
RDW: 14.4 % (ref 11.5–15.5)
WBC: 6.6 K/uL (ref 4.0–10.5)
nRBC: 0 % (ref 0.0–0.2)

## 2024-11-20 LAB — BASIC METABOLIC PANEL WITH GFR
Anion gap: 8 (ref 5–15)
BUN: 7 mg/dL (ref 6–20)
CO2: 29 mmol/L (ref 22–32)
Calcium: 8.9 mg/dL (ref 8.9–10.3)
Chloride: 107 mmol/L (ref 98–111)
Creatinine, Ser: 0.78 mg/dL (ref 0.44–1.00)
GFR, Estimated: >60 mL/min (ref >60)
Glucose, Bld: 84 mg/dL (ref 70–99)
Potassium: 3.8 mmol/L (ref 3.5–5.1)
Sodium: 144 mmol/L (ref 135–145)

## 2024-11-20 LAB — MAGNESIUM: Magnesium: 2.2 mg/dL (ref 1.7–2.4)

## 2024-11-20 SURGERY — EGD (ESOPHAGOGASTRODUODENOSCOPY)
Anesthesia: Monitor Anesthesia Care

## 2024-11-20 MED ORDER — PYRIDOXINE HCL 25 MG PO TABS
50.0000 mg | ORAL_TABLET | Freq: Every day | ORAL | Status: DC
  Administered 2024-11-20 – 2024-11-21 (×2): 50 mg via ORAL
  Filled 2024-11-20 (×3): qty 2

## 2024-11-20 MED ORDER — DOXYLAMINE SUCCINATE (SLEEP) 25 MG PO TABS
25.0000 mg | ORAL_TABLET | Freq: Every evening | ORAL | Status: DC | PRN
  Filled 2024-11-20: qty 1

## 2024-11-20 MED ORDER — LACTATED RINGERS IV SOLN: INTRAVENOUS | Status: DC | PRN

## 2024-11-20 MED ORDER — PROMETHAZINE HCL 25 MG/ML IJ SOLN
12.5000 mg | Freq: Once | INTRAMUSCULAR | Status: AC
  Administered 2024-11-20: 12.5 mg via INTRAVENOUS
  Filled 2024-11-20: qty 0.5

## 2024-11-20 MED ORDER — PROPOFOL 10 MG/ML IV BOLUS
INTRAVENOUS | Status: DC | PRN
  Administered 2024-11-20: 300 mg via INTRAVENOUS

## 2024-11-20 NOTE — Plan of Care (Signed)

## 2024-11-20 NOTE — Anesthesia Preprocedure Evaluation (Signed)
 Anesthesia Evaluation  Patient identified by MRN, date of birth, ID band Patient awake    Reviewed: Allergy & Precautions, H&P , NPO status , Patient's Chart, lab work & pertinent test results, reviewed documented beta blocker date and time   History of Anesthesia Complications (+) history of anesthetic complications  Airway Mallampati: II  TM Distance: >3 FB Neck ROM: full    Dental no notable dental hx.    Pulmonary neg pulmonary ROS, asthma , pneumonia   Pulmonary exam normal breath sounds clear to auscultation       Cardiovascular Exercise Tolerance: Good hypertension, negative cardio ROS + dysrhythmias  Rhythm:regular Rate:Normal     Neuro/Psych  Headaches PSYCHIATRIC DISORDERS  Depression     Neuromuscular disease negative neurological ROS  negative psych ROS   GI/Hepatic negative GI ROS, Neg liver ROS,GERD  ,,  Endo/Other  negative endocrine ROSHypothyroidism    Renal/GU negative Renal ROS  negative genitourinary   Musculoskeletal   Abdominal   Peds  Hematology negative hematology ROS (+) Blood dyscrasia, anemia   Anesthesia Other Findings   Reproductive/Obstetrics negative OB ROS                              Anesthesia Physical Anesthesia Plan  ASA: 3  Anesthesia Plan: MAC   Post-op Pain Management:    Induction:   PONV Risk Score and Plan: Propofol  infusion  Airway Management Planned:   Additional Equipment:   Intra-op Plan:   Post-operative Plan:   Informed Consent: I have reviewed the patients History and Physical, chart, labs and discussed the procedure including the risks, benefits and alternatives for the proposed anesthesia with the patient or authorized representative who has indicated his/her understanding and acceptance.     Dental Advisory Given  Plan Discussed with: CRNA  Anesthesia Plan Comments:         Anesthesia Quick Evaluation

## 2024-11-20 NOTE — Progress Notes (Signed)
 PROGRESS NOTE    Sharon Rowland  FMW:[id number] DOB: [birth year] DOA: 11/16/2024 PCP: Myra Geni ORN, FNP   Brief Narrative:   Sharon Rowland is a 38 y.o. female with medical history significant for gastroparesis, GERD, chronic abdominal pain, Ehlers Danlos syndrome, asthma, hypothyroidism, mast cell activation syndrome, POTS, lupus, Sjogren syndrome, status post lap chole, states she has had a total of 25 surgeries, who presents to the ER due to intermittent abdominal pain for the past 3 weeks associated with intractable nausea and vomiting for the past 2 days.  Since October she has lost 12 pounds in result of this.  No fever or chills. In the ER patient was tachycardic, tachypneic and hypervolemic on exam.  UA negative for pyuria.  Patient received IV fluid bolus, IV analgesics and IV antibiotics. CT abdomen pelvis with contrast revealed liquid stool in the colon, possibly indicating viral enterocolitis.  Patient admitted for hydration, symptom management with ongoing nausea and vomiting.  Has been getting IV Benadryl , scopolamine  patch, IV Toradol  for pain.  No sustained relief of symptoms.  EGD 11/29   Assessment & Plan:   Principal Problem:   Intractable nausea and vomiting Active Problems:   Paroxysmal SVT (supraventricular tachycardia) (HCC)   Mast cell activation syndrome   Ehlers-Danlos syndrome   Chronic pain   Intractable nausea and vomiting in the setting of gastroparesis Continue supportive care S/p IV fluid hydration  Continue scopolamine  patch Patient says Benadryl  helps, will give IV Benadryl  to 50 every 8 hours as needed. Symptoms persistent. Consulted GI, patient underwent EGD 11/29, findings of gastritis otherwise normal esophagus and duodenum.  Biopsy taken. GI recommends doxylamine/pyridoxine for symptoms. Patient has not trialed food postprocedure yet.  Will advance diet as tolerated. Continue IV Protonix  for now. Will obtain follow-up labs per patient  request.  Chronic abdominal pain, in the setting of multiple intra-abdominal surgeries. Continue pain control.  CT abdomen pelvis with contrast revealed liquid stool in the colon, possibly indicating viral enterocolitis. No diarrhea. Patient takes diclofenac  daily, avoid as much as possible   hypothyroidism Resume home levothyroxine    GERD Resume home PPI   Polyneuropathy Resume home Lyrica    Lupus, Ehlers-Danlos syndrome, asthma mast activation syndrome POTS, Sjogren syndrome Noted, appear stable.     DVT prophylaxis: Lovenox  subcu Code Status: Full code Family Communication: Husband over speaker phone 11/29 Disposition Plan: MedSurg Status is: Observation The patient remains OBS appropriate and will d/c before 2 midnights.   Subjective:  Patient seen and examined at the bedside.  Vital signs stable.  She had upper GI endoscopy today, findings of gastritis otherwise normal duodenum and esophagus.  Patient has not eaten food postprocedure.  Feels nauseous.  Will try doxylamine/pyridoxine per GI recommendation. If able to tolerate diet, possible discharge today versus tomorrow   objective: Vitals:   11/20/24 0804 11/20/24 0828 11/20/24 0830 11/20/24 0900  BP: 113/78 100/63 100/66 106/71  Pulse: 76 77 78 80  Resp: 18 19 17 18   Temp: 98 F (36.7 C) 97.6 F (36.4 C)  (!) 97.5 F (36.4 C)  TempSrc: Oral   Oral  SpO2:  98% 97% 100%  Weight:      Height:        Intake/Output Summary (Last 24 hours) at 11/20/2024 0935 Last data filed at 11/20/2024 9177 Gross per 24 hour  Intake 400 ml  Output --  Net 400 ml   Filed Weights   11/16/24 1822  Weight: 65.8 kg    Examination:  General exam: Appears calm and comfortable  Respiratory system: Bilateral decreased breath sounds at bases Cardiovascular system: S1 & S2 heard, Rate controlled Gastrointestinal system: Abdomen is nondistended, mild.  Tenderness.  Normal bowel sounds heard. Extremities: No cyanosis,  clubbing, edema  Central nervous system: Alert and oriented. No focal neurological deficits. Moving extremities Skin: No rashes, lesions or ulcers Psychiatry: Judgement and insight appear normal. Mood & affect appropriate.     Data Reviewed: I have personally reviewed following labs and imaging studies  CBC: Recent Labs  Lab 11/16/24 1950 11/17/24 0540 11/20/24 0658  WBC 8.5 6.3 6.6  NEUTROABS 4.8  --  2.0  HGB 13.0 11.4* 11.2*  HCT 38.7 35.3* 34.5*  MCV 80.8 83.3 83.5  PLT 323 273 271   Basic Metabolic Panel: Recent Labs  Lab 11/16/24 1930 11/16/24 1950 11/17/24 0540 11/20/24 0658  NA  --  139 142 144  K  --  3.5 3.6 3.8  CL  --  102 108 107  CO2  --  26 26 29   GLUCOSE  --  97 92 84  BUN  --  <5* <5* 7  CREATININE  --  0.71 0.71 0.78  CALCIUM   --  9.5 8.6* 8.9  MG 2.5*  --   --  2.2  PHOS 2.6  --   --   --    GFR: Estimated Creatinine Clearance: 85.8 mL/min (by C-G formula based on SCr of 0.78 mg/dL). Liver Function Tests: Recent Labs  Lab 11/16/24 1950  AST <10*  ALT 13  ALKPHOS 85  BILITOT 0.4  PROT 7.4  ALBUMIN 4.9   Recent Labs  Lab 11/16/24 1950  LIPASE 19   No results for input(s): AMMONIA in the last 168 hours. Coagulation Profile: No results for input(s): INR, PROTIME in the last 168 hours. Cardiac Enzymes: No results for input(s): CKTOTAL, CKMB, CKMBINDEX, TROPONINI in the last 168 hours. BNP (last 3 results) No results for input(s): PROBNP in the last 8760 hours. HbA1C: No results for input(s): HGBA1C in the last 72 hours. CBG: No results for input(s): GLUCAP in the last 168 hours. Lipid Profile: No results for input(s): CHOL, HDL, LDLCALC, TRIG, CHOLHDL, LDLDIRECT in the last 72 hours. Thyroid  Function Tests: No results for input(s): TSH, T4TOTAL, FREET4, T3FREE, THYROIDAB in the last 72 hours. Anemia Panel: No results for input(s): VITAMINB12, FOLATE, FERRITIN, TIBC, IRON,  RETICCTPCT in the last 72 hours. Sepsis Labs: No results for input(s): PROCALCITON, LATICACIDVEN in the last 168 hours.  No results found for this or any previous visit (from the past 240 hours).       Radiology Studies: No results found.       Scheduled Meds:  alum & mag hydroxide-simeth  30 mL Oral Once   Atogepant   60 mg Oral Daily   buPROPion   300 mg Oral Daily   enoxaparin  (LOVENOX ) injection  40 mg Subcutaneous Q24H   estradiol   0.05 mg Transdermal Weekly   feeding supplement  237 mL Oral BID BM   levothyroxine   88 mcg Oral Q0600   methylphenidate   5 mg Oral BID WC   multivitamin with minerals  1 tablet Oral Daily   pantoprazole  (PROTONIX ) IV  40 mg Intravenous Q12H   pregabalin   100 mg Oral Daily   scopolamine   1 patch Transdermal Q72H   Continuous Infusions:          Romy Ipock, MD [facility] 11/20/2024, 9:35 AM

## 2024-11-20 NOTE — Op Note (Addendum)
 [facility] Patient Name: Sharon Rowland Procedure Date: 11/20/2024 7:35 AM MRN: [id number] Date of Birth: [birth year] Attending MD: Toribio Fortune , , [id number] CSN: [id number] Age: 38 Admit Type: Inpatient Procedure:                Upper GI endoscopy Indications:              Abdominal pain, Nausea with vomiting Providers:                Toribio Fortune, Sharlet Fuel RN, RN, Bascom Blush Referring MD:              Medicines:                Monitored Anesthesia Care Complications:            No immediate complications. Estimated Blood Loss:     Estimated blood loss: none. Procedure:                Pre-Anesthesia Assessment:                           - Prior to the procedure, a History and Physical                            was performed, and patient medications, allergies                            and sensitivities were reviewed. The patient's                            tolerance of previous anesthesia was reviewed.                           - The risks and benefits of the procedure and the                            sedation options and risks were discussed with the                            patient. All questions were answered and informed                            consent was obtained.                           After obtaining informed consent, the endoscope was                            passed under direct vision. Throughout the                            procedure, the patient's blood pressure, pulse, and                            oxygen saturations were monitored continuously. The  [id number] ([id number]) Upper was introduced through                            the mouth, and advanced to the second part of                            duodenum. The upper GI endoscopy was accomplished                            without difficulty. The patient tolerated the                            procedure well. Scope In: 8:14:59 AM Scope Out: 8:19:43  AM Total Procedure Duration: 0 hours 4 minutes 44 seconds  Findings:      The examined esophagus was normal.      Patchy mild inflammation characterized by erythema was found in the       gastric antrum. Biopsies were taken with a cold forceps for Helicobacter       pylori testing.      The examined duodenum was normal. Biopsies were taken with a cold       forceps for histology. Impression:               - Normal esophagus.                           - Gastritis. Biopsied.                           - Normal examined duodenum. Biopsied. Moderate Sedation:      Per Anesthesia Care Recommendation:           - Return patient to hospital ward for ongoing care.                           - Advance diet as tolerated.                           -Continue with scopolamine  patches                           - Phenergan  12.5 mg every 8 hours PRN for                            refractory nausea                           - Can try using doxylamine and piridoxine as needed                            if refractory nausea                           - Pantoprazole  40 mg BID - Switch back to                            omeprazole  40 mg daily upon discharge                           -  Try to avoid NSAIDs as much as possible                           - Consider GES as outpatient                           - Await pathology results. Procedure Code(s):        --- Professional ---                           [id number], Esophagogastroduodenoscopy, flexible,                            transoral; with biopsy, single or multiple Diagnosis Code(s):        --- Professional ---                           K29.70, Gastritis, unspecified, without bleeding                           R10.9, Unspecified abdominal pain                           R11.2, Nausea with vomiting, unspecified CPT copyright 2022 American Medical Association. All rights reserved. The codes documented in this report are preliminary and upon coder review may   be revised to meet current compliance requirements. Toribio Fortune, MD Toribio Fortune,  11/20/2024 8:29:46 AM This report has been signed electronically. Number of Addenda: 0

## 2024-11-20 NOTE — Consult Note (Addendum)
 Toribio Fortune, M.D. Gastroenterology & Hepatology                                           Patient Name: Sharon Rowland Account #: @FLAACCTNO @   MRN: [id number] Admission Date: 11/16/2024 Date of Evaluation:  11/20/2024 Time of Evaluation: 7:56 AM   Referring Physician: Derryl Duval, MD  Chief Complaint: Nausea and vomiting  HPI:  This is a 38 y.o. female with medical history significant for gastroparesis, GERD, chronic abdominal pain, Ehlers Danlos syndrome, asthma, hypothyroidism, mast cell activation syndrome, POTS, lupus, Sjogren syndrome, presumed gastroparesis , chronic NSAID use, who came to the hospital after presenting with worsening episodes of nausea and vomiting for the last 3 weeks.  Patient reports that 3 weeks ago she started presenting recurrent episodes of nausea which aggravated for last 3 days that she was having recurrent vomiting episodes multiple times.  Reports that she was being seen by an ENT who switched her from omeprazole  to pantoprazole  and she noticed that at that time her symptoms change.  Notably, she reports that she has been taking diclofenac  regularly, has a prescription for 100 mg twice daily but she has been trying to take it only once a day.  Has presented some intermittent abdominal pain.  Has not presented melena, hematochezia, fever, chills, abdominal distention.  Has lost a few pounds since her symptoms started.  Patient reports that she has been using scopolamine  patches and Phenergan  suppositories as needed but this has not recently worked.  She could not tolerate Zofran  due to prolonged QTc and she has not tolerated in the past Reglan  or Compazine .  Notably, the patient has been followed by a gastroenterologist in [place] [facility] - Has seen Delmar Pike, NP.  She reports has had EGD and colonoscopy in the last 2 years with them for evaluation of dysphagia, GERD and abnormal CT scan but I do not have access to these procedures.  She  reports she had gastritis at that time.  Per their notes, she had a negative fecal calprotectin on July 2023 and negative celiac panel with low IgA but the rest of immunoglobulins were normal.  She was also seen by Dr. Gerlene Law during hospitalization [facility] in March 2025.  Is considered that her symptoms were likely related to EDS/POTS.  She was advised to start a bowel cleanout with MiraLAX  colon prep and she was advised to start pyridostigmine 30 mg 3 times daily.  Patient reported she has had previous cholecystectomy and salpingectomy.  Has had a recent surgeries and she believes that this has increased frequency of her regular gastrointestinal complaints.  Labs today showed normal BMP, CBC with a hemoglobin of 11.2, WBC 6.6 and platelets 271.  Patient had a CT of the abdomen and pelvis with IV contrast on 11/16/2024 which showed some liquid stool in the colon but no other abnormalities   Past Medical History: SEE CHRONIC ISSSUES: Past Medical History:  Diagnosis Date   Allergic rhinitis    Anemia    Arthritis    Asthma    last used inhaler 1 month ago   Complication of anesthesia    if given fentanyl  has severe itching, rash and wheezing   Cubital tunnel syndrome on left 07/2023   Depression    Dysphagia    Dysrhythmia    SVT   Ehlers-Danlos syndrome    Female bladder  prolapse    Gastritis    GERD (gastroesophageal reflux disease)    with pregnancy only   Hashimoto's thyroiditis    Hypothyroidism    Lupus    Mast cell activation syndrome    Migraines    Osteoarthritis    Pelvic pain in antepartum period in second trimester 01/17/2014   Pneumonia    POTS (postural orthostatic tachycardia syndrome)    Pregnant 08/01/2015   Psoriatic arthritis (HCC)    PSVT (paroxysmal supraventricular tachycardia)    could not be confirmed with 2016 study   Rectocele    Sjogren's syndrome    Thoracic outlet syndrome    Thyroid  disease    Urethral prolapse    Uterine prolapse     Vaginal Pap smear, abnormal    Past Surgical History:  Past Surgical History:  Procedure Laterality Date   ANKLE SURGERY Bilateral    2023 L ankle debridement and internal brace 2024 R ankle debridement and internal brace   ANTERIOR AND POSTERIOR VAGINAL REPAIR     ANTERIOR INTEROSSEOUS NERVE DECOMPRESSION Left 08/13/2023   Procedure: SUBCUTANEOUS ANTERIOR TRANSPOSITION OF ULNAR NERVE AT LEFT ELBOW;  Surgeon: Edie Norleen PARAS, MD;  Location: [facility] ORS;  Service: Orthopedics;  Laterality: Left;   BREAST REDUCTION SURGERY Bilateral 08/20/2022   Procedure: MAMMARY REDUCTION  (BREAST) with Liposuction;  Surgeon: Marene Sieving, MD;  Location: [facility] OR;  Service: Plastics;  Laterality: Bilateral;  3 hours   COLONOSCOPY WITH ESOPHAGOGASTRODUODENOSCOPY (EGD)  12/26/2022   CYSTOCELE REPAIR N/A 09/17/2017   Procedure: ANTERIOR REPAIR (CYSTOCELE);  Surgeon: Jayne Vonn DEL, MD;  Location: [facility] ORS;  Service: Gynecology;  Laterality: N/A;   KNEE SURGERY Left 11/2021   KNEE SURGERY Right 2023   LABIOPLASTY N/A 03/24/2024   Procedure: LABIAPLASTY, VULVA;  Surgeon: Jayne Vonn DEL, MD;  Location: [facility];  Service: Gynecology;  Laterality: N/A;   LIPOSUCTION  03/2022   PANNICULECTOMY N/A 04/28/2023   Procedure: PANNICULECTOMY;  Surgeon: Waddell Leonce NOVAK, MD;  Location: [facility] OR;  Service: Plastics;  Laterality: N/A;   SALPINGO-OOPHORECTOMY, BILATERAL, ROBOT ASSISTED, LAPAROSCOPIC Bilateral 03/24/2024   Procedure: SALPINGO-OOPHORECTOMY, BILATERAL, ROBOT ASSISTED, LAPAROSCOPIC, D5;  Surgeon: Jayne Vonn DEL, MD;  Location: [facility];  Service: Gynecology;  Laterality: Bilateral;   SUPRAVENTRICULAR TACHYCARDIA ABLATION  01/05/2015   unsuccessful   SUPRAVENTRICULAR TACHYCARDIA ABLATION N/A 01/05/2015   Procedure: SUPRAVENTRICULAR TACHYCARDIA ABLATION;  Surgeon: Danelle LELON Waddell, MD;  Location: [facility] CATH LAB;  Service: Cardiovascular;  Laterality: N/A;   Thoracic outlet decompression  2024   VAGINAL HYSTERECTOMY N/A 09/17/2017    Procedure: HYSTERECTOMY VAGINAL WITH POSSIBLE BILATERAL SALPINGECTOMY;  Surgeon: Jayne Vonn DEL, MD;  Location: [facility];  Service: Gynecology;  Laterality: N/A;   WRIST GANGLION EXCISION Left    x2   Family History:  Family History  Problem Relation Age of Onset   Hypertension Mother    Hyperlipidemia Mother    Multiple sclerosis Mother    COPD Mother    Cancer Mother        melanoma x 2   Diabetes Mother    Heart disease Father        PSVT   Hyperlipidemia Brother    Hypertension Brother    Heart disease Maternal Grandfather        heart attack   Cancer Paternal Grandmother        pancreatic cancer   Heart disease Paternal Grandmother        MI   Hyperlipidemia Paternal Grandmother  Hypertension Paternal Grandmother    Asthma Daughter    Asthma Son    Autism Son    ADD / ADHD Son    Tourette syndrome Son    Other Son        Neurological and mental issues   ADD / ADHD Son    Social History:  Social History   Tobacco Use   Smoking status: Never    Passive exposure: Never   Smokeless tobacco: Never  Vaping Use   Vaping status: Never Used  Substance Use Topics   Alcohol use: No   Drug use: No    Home Medications:  Prior to Admission medications   Medication Sig Start Date End Date Taking? Authorizing Provider  albuterol  (VENTOLIN  HFA) 108 (90 Base) MCG/ACT inhaler Inhale 1-2 puffs into the lungs every 6 (six) hours as needed for wheezing or shortness of breath.   Yes [provider]  Atogepant  (QULIPTA ) 60 MG TABS Take 60 mg by mouth daily.   Yes [provider]  betamethasone dipropionate 0.05 % cream Apply 1 Application topically 2 (two) times daily as needed (psoriasis).   Yes [provider]  botulinum toxin Type A (BOTOX) 200 units injection Inject 200 Units as directed every 3 (three) months.   Yes [provider]  buPROPion  (WELLBUTRIN  XL) 300 MG 24 hr tablet Take 1 tablet by mouth daily. 09/05/23  Yes Jayne Vonn DEL, MD   calcium  carbonate (TUMS EX) 750 MG chewable tablet Chew 1 tablet by mouth as needed for heartburn.   Yes [provider]  cholecalciferol (VITAMIN D3) 25 MCG (1000 UNIT) tablet Take 1,000 Units by mouth daily.   Yes [provider]  clobetasol cream (TEMOVATE) 0.05 % Apply 1 Application topically daily as needed (psoriasis).   Yes [provider]  desonide (DESOWEN) 0.05 % ointment Apply 1 application  topically 2 (two) times daily as needed (psoriasis). 04/24/20  Yes [provider]  diclofenac  (VOLTAREN ) 50 MG EC tablet Take 2 tablets by mouth twice daily. 09/05/23  Yes Jayne Vonn DEL, MD  estradiol  (VIVELLE -DOT) 0.1 MG/24HR patch Place 1 patch (0.1 mg total) onto the skin 2 (two) times a week. 05/06/24  Yes Jayne Vonn DEL, MD  FIBER PO Take 20 mg by mouth daily.   Yes [provider]  levothyroxine  (SYNTHROID ) 88 MCG tablet Take 88 mcg by mouth daily before breakfast. 10/28/22  Yes [provider]  methylphenidate  10 MG ER tablet TAKE ONE TABLET BY MOUTH IN THE MORNING 04/18/23  Yes Jayne Vonn DEL, MD  Multiple Vitamin (MULTIVITAMIN WITH MINERALS) TABS tablet Take 1 tablet by mouth daily.   Yes [provider]  nystatin cream (MYCOSTATIN) Apply 1 Application topically 2 (two) times daily as needed for dry skin (yeast).   Yes [provider]  pantoprazole  (PROTONIX ) 40 MG tablet Take 1 tablet (40 mg total) by mouth 2 (two) times daily. 11/02/24 12/02/24 Yes Roark Rush, MD  pregabalin  (LYRICA ) 100 MG capsule Take 1 capsule (100 mg total) by mouth 2 (two) times daily. 10/23/22  Yes Jayne Vonn DEL, MD  PREMARIN  vaginal cream Insert 1 gram vaginally nightly. 05/02/24  Yes Jayne Vonn DEL, MD  promethazine  (PHENERGAN ) 25 MG suppository Place 1 suppository (25 mg total) rectally every 6 (six) hours as needed for nausea or vomiting. 05/23/24  Yes Daralene Bruckner D, PA-C  promethazine  (PHENERGAN ) 25 MG tablet Take 1 tablet (25 mg total) by  mouth every 6 (six) hours as needed for  nausea or vomiting. 05/23/24  Yes Daralene Bruckner D, PA-C  scopolamine  (TRANSDERM-SCOP) 1 MG/3DAYS Place 1 patch (1.5 mg total) onto the skin every 3 (three) days. 01/25/24  Yes Cleotilde Rogue, MD  tretinoin (RETIN-A) 0.025 % cream Apply topically at bedtime. 10/26/24  Yes [provider]  Guselkumab (TREMFYA) 100 MG/ML SOPN Inject 1 Dose into the skin every 30 (thirty) days.    [provider]    Inpatient Medications:  Current Facility-Administered Medications:    [MAR Hold] albuterol  (PROVENTIL ) (2.5 MG/3ML) 0.083% nebulizer solution 3 mL, 3 mL, Inhalation, Q6H PRN, Hall, Carole N, DO   [MAR Hold] alum & mag hydroxide-simeth (MAALOX/MYLANTA) 200-200-20 MG/5ML suspension 30 mL, 30 mL, Oral, Once, Barrett, Jamie N, PA-C   [MAR Hold] Atogepant  TABS 60 mg, 60 mg, Oral, Daily, Sigdel, Santosh, MD   [MAR Hold] buPROPion  (WELLBUTRIN  XL) 24 hr tablet 300 mg, 300 mg, Oral, Daily, Sigdel, Santosh, MD, 300 mg at 11/19/24 0821   [MAR Hold] calcium  carbonate (TUMS - dosed in mg elemental calcium ) chewable tablet 200 mg of elemental calcium , 1 tablet, Oral, PRN, Sigdel, Santosh, MD, 200 mg of elemental calcium  at 11/19/24 2044   [facility] Hold] diphenhydrAMINE  (BENADRYL ) injection 50 mg, 50 mg, Intravenous, Q8H PRN, Sigdel, Santosh, MD, 50 mg at 11/20/24 0526   [MAR Hold] enoxaparin  (LOVENOX ) injection 40 mg, 40 mg, Subcutaneous, Q24H, Hall, Carole N, DO, 40 mg at 11/20/24 0527   [MAR Hold] estradiol  (CLIMARA  - Dosed in mg/24 hr) patch 0.05 mg, 0.05 mg, Transdermal, Weekly, Sigdel, Santosh, MD, 0.05 mg at 11/18/24 1439   [MAR Hold] feeding supplement (ENSURE PLUS HIGH PROTEIN) liquid 237 mL, 237 mL, Oral, BID BM, Sigdel, Santosh, MD, 237 mL at 11/19/24 1400   [MAR Hold] levothyroxine  (SYNTHROID ) tablet 88 mcg, 88 mcg, Oral, Q0600, Shona Terry SAILOR, DO, 88 mcg at 11/19/24 0614   [facility] Hold] melatonin tablet 6 mg, 6 mg, Oral, QHS PRN, Shona Terry N, DO, 6 mg at  11/19/24 2046   [facility] Hold] methylphenidate  (RITALIN ) tablet 5 mg, 5 mg, Oral, BID WC, Sigdel, Santosh, MD, 5 mg at 11/19/24 1131   [MAR Hold] multivitamin with minerals tablet 1 tablet, 1 tablet, Oral, Daily, Hall, Carole N, DO, 1 tablet at 11/19/24 0820   [facility] Hold] pantoprazole  (PROTONIX ) injection 40 mg, 40 mg, Intravenous, Q12H, Adefeso, Oladapo, DO, 40 mg at 11/19/24 2043   [facility] Hold] polyethylene glycol (MIRALAX  / GLYCOLAX ) packet 17 g, 17 g, Oral, Daily PRN, Hall, Carole N, DO   [MAR Hold] pregabalin  (LYRICA ) capsule 100 mg, 100 mg, Oral, Daily, Hall, Carole N, DO, 100 mg at 11/19/24 0820   [MAR Hold] scopolamine  (TRANSDERM-SCOP) 1 MG/3DAYS 1 mg, 1 patch, Transdermal, Q72H, Sigdel, Santosh, MD, 1 mg at 11/18/24 1436 Allergies: Apremilast, Cosentyx [secukinumab], Fentanyl , Hydroxychloroquine, Metoclopramide , Other, Prochlorperazine , Semaglutide, Silver , Tape, Latex, Zofran  [ondansetron  hcl], and Ezetimibe  Complete Review of Systems: GENERAL: negative for malaise, night sweats HEENT: No changes in hearing or vision, no nose bleeds or other nasal problems. NECK: Negative for lumps, goiter, pain and significant neck swelling RESPIRATORY: Negative for cough, wheezing CARDIOVASCULAR: Negative for chest pain, leg swelling, palpitations, orthopnea GI: SEE HPI MUSCULOSKELETAL: Negative for joint pain or swelling, back pain, and muscle pain. SKIN: Negative for lesions, rash PSYCH: Negative for sleep disturbance, mood disorder and recent psychosocial stressors. HEMATOLOGY Negative for prolonged bleeding, bruising easily, and swollen nodes. ENDOCRINE: Negative for cold or heat intolerance, polyuria, polydipsia and goiter. NEURO: negative for tremor, gait imbalance, syncope and seizures.  The remainder of the review of systems is noncontributory.  Physical Exam: BP 105/66 (BP Location: Left Arm)   Pulse 80   Temp 97.8 F (36.6 C) (Oral)   Resp 16   Ht 5' 5 (1.651 m)   Wt 65.8 kg   LMP  10/23/2016 (Approximate)   SpO2 97%   BMI 24.13 kg/m  GENERAL: The patient is AO x3, in no acute distress. HEENT: Head is normocephalic and atraumatic. EOMI are intact. Mouth is well hydrated and without lesions. NECK: Supple. No masses LUNGS: Clear to auscultation. No presence of rhonchi/wheezing/rales. Adequate chest expansion HEART: RRR, normal s1 and s2. ABDOMEN: Soft, nontender, no guarding, no peritoneal signs, and nondistended. BS +. No masses. EXTREMITIES: Without any cyanosis, clubbing, rash, lesions or edema. NEUROLOGIC: AOx3, no focal motor deficit. SKIN: no jaundice, no rashes  Laboratory Data CBC:     Component Value Date/Time   WBC 6.6 11/20/2024 0658   RBC 4.13 11/20/2024 0658   HGB 11.2 (L) 11/20/2024 0658   HGB 11.7 10/17/2022 1122   HCT 34.5 (L) 11/20/2024 0658   HCT 35.3 10/17/2022 1122   PLT 271 11/20/2024 0658   PLT 306 10/17/2022 1122   MCV 83.5 11/20/2024 0658   MCV 81 10/17/2022 1122   MCH 27.1 11/20/2024 0658   MCHC 32.5 11/20/2024 0658   RDW 14.4 11/20/2024 0658   RDW 13.9 10/17/2022 1122   LYMPHSABS 3.8 11/20/2024 0658   LYMPHSABS 2.5 10/17/2022 1122   MONOABS 0.6 11/20/2024 0658   EOSABS 0.1 11/20/2024 0658   EOSABS 0.1 10/17/2022 1122   BASOSABS 0.1 11/20/2024 0658   BASOSABS 0.1 10/17/2022 1122   COAG:  Lab Results  Component Value Date   INR 1.0 04/27/2019   INR 0.99 01/05/2015    BMP:     Latest Ref Rng & Units 11/17/2024    5:40 AM 11/16/2024    7:50 PM 09/11/2024    9:29 PM  BMP  Glucose 70 - 99 mg/dL 92  97  95   BUN 6 - 20 mg/dL <5  <5  8   Creatinine 0.44 - 1.00 mg/dL 9.28  9.28  9.18   Sodium 135 - 145 mmol/L 142  139  137   Potassium 3.5 - 5.1 mmol/L 3.6  3.5  3.5   Chloride 98 - 111 mmol/L 108  102  101   CO2 22 - 32 mmol/L 26  26  23    Calcium  8.9 - 10.3 mg/dL 8.6  9.5  9.2     HEPATIC:     Latest Ref Rng & Units 11/16/2024    7:50 PM 07/12/2024   12:48 AM 05/23/2024    4:26 PM  Hepatic Function  Total Protein  6.5 - 8.1 g/dL 7.4  7.4  7.7   Albumin 3.5 - 5.0 g/dL 4.9  4.2  4.5   AST 15 - 41 U/L 10  10  10    ALT 0 - 44 U/L 13  16  14    Alk Phosphatase 38 - 126 U/L 85  80  66   Total Bilirubin 0.0 - 1.2 mg/dL 0.4  0.5  0.6     CARDIAC:  Lab Results  Component Value Date   TROPONINI <0.03 05/16/2019     Imaging: I personally reviewed and interpreted the available imaging.  Assessment & Plan: PRANATHI WINFREE is a 38 y.o. female with medical history significant for gastroparesis, GERD, chronic abdominal pain, Ehlers Danlos syndrome, asthma, hypothyroidism, mast cell activation syndrome,  POTS, lupus, Sjogren syndrome, presumed gastroparesis , chronic NSAID use, who came to the hospital after presenting with worsening episodes of nausea and vomiting for the last 3 weeks.  Patient has presented recurrent episodes of nausea and vomiting with some abdominal pain.  Unclear if this is related to recent switch of PPI for lower potency PPI and intermittent use of NSAIDs.  I encouraged the patient to switch to her regular omeprazole  once she is discharged home.  However, we will evaluate any endoluminal etiologies of her persistent symptoms with an EGD.  It is important to consider that EDS/POTS/mast cell disorder could be having a role in her symptoms, which will need chronic management.  Notably, she reported history of gastroparesis but never had a gastric and study (patient did not pursue this testing as she did not think it would change her management).  I discussed with her that if she indeed has gastroparesis, there are some potential options different from Reglan  for treatment but this (domperidone, prucalopride) will need to be confirmed with a gastric emptying study as outpatient.  - Schedule EGD -Continue with scopolamine  patches - Phenergan  12.5 mg every 8 hours PRN for refractory nausea - Can try using doxylamine  and piridoxine as needed if refractory nausea - Pantoprazole  40 mg BID - Switch  back to omeprazole  40 mg daily upon discharge - Try to avoid NSAIDs as much as possible - Consider GES as outpatient  Toribio Fortune, MD Gastroenterology and Hepatology [facility]

## 2024-11-20 NOTE — Transfer of Care (Signed)
 Immediate Anesthesia Transfer of Care Note  Patient: Sharon Rowland  Procedure(s) Performed: EGD (ESOPHAGOGASTRODUODENOSCOPY)  Patient Location: PACU  Anesthesia Type:General  Level of Consciousness: awake and alert   Airway & Oxygen Therapy: Patient Spontanous Breathing  Post-op Assessment: Report given to RN and Post -op Vital signs reviewed and stable  Post vital signs: Reviewed and stable  Last Vitals:  Vitals Value Taken Time  BP 100/66 11/20/24 08:30  Temp 36.4 C 11/20/24 08:28  Pulse 73 11/20/24 08:31  Resp 17 11/20/24 08:31  SpO2 99 % 11/20/24 08:31  Vitals shown include unfiled device data.  Last Pain:  Vitals:   11/20/24 0828  TempSrc:   PainSc: Asleep      Patients Stated Pain Goal: 5 (11/20/24 0804)  Complications: No notable events documented.

## 2024-11-20 NOTE — Plan of Care (Signed)
   Problem: Education: Goal: Knowledge of General Education information will improve Description: Including pain rating scale, medication(s)/side effects and non-pharmacologic comfort measures Outcome: Progressing   Problem: Activity: Goal: Risk for activity intolerance will decrease Outcome: Progressing   Problem: Nutrition: Goal: Adequate nutrition will be maintained Outcome: Progressing

## 2024-11-20 NOTE — Anesthesia Postprocedure Evaluation (Signed)
 Anesthesia Post Note  Patient: Sharon Rowland  Procedure(s) Performed: EGD (ESOPHAGOGASTRODUODENOSCOPY)  Patient location during evaluation: PACU Anesthesia Type: MAC Level of consciousness: awake and alert Pain management: pain level controlled Vital Signs Assessment: post-procedure vital signs reviewed and stable Respiratory status: spontaneous breathing, nonlabored ventilation, respiratory function stable and patient connected to nasal cannula oxygen Cardiovascular status: blood pressure returned to baseline and stable Postop Assessment: no apparent nausea or vomiting Anesthetic complications: no   No notable events documented.   Last Vitals:  Vitals:   11/20/24 0804 11/20/24 0828  BP: 113/78 100/63  Pulse: 76 77  Resp: 18 19  Temp: 36.7 C 36.4 C  SpO2:  98%    Last Pain:  Vitals:   11/20/24 0828  TempSrc:   PainSc: Asleep                 Yvonna JINNY Bosworth

## 2024-11-20 NOTE — Brief Op Note (Signed)
 11/20/2024  8:35 AM  PATIENT:  Sharon Rowland  38 y.o. female  PRE-OPERATIVE DIAGNOSIS:  nausea, vomiting  POST-OPERATIVE DIAGNOSIS:  gastritis  PROCEDURE:  Procedure(s): EGD (ESOPHAGOGASTRODUODENOSCOPY) (N/A)  SURGEON:  Surgeons and Role:    * Eartha Angelia Sieving, MD - Primary  Patient underwent EGD under propofol  sedation.  Tolerated the procedure adequately.   FINDINGS: - Normal esophagus.  - Gastritis.  Biopsied.  - Normal examined duodenum.  Biopsied.   RECOMMENDATIONS - Return patient to hospital ward for ongoing care.  - Advance diet as tolerated.  -Continue with scopolamine  patches - Phenergan  12.5 mg every 8 hours PRN for refractory nausea - Can try using doxylamine and piridoxine as needed if refractory nausea - Pantoprazole  40 mg BID - Switch back to omeprazole  40 mg daily upon discharge - Try to avoid NSAIDs as much as possible - Consider GES as outpatient - Await pathology results.  Sieving Eartha, MD Gastroenterology and Hepatology [facility]

## 2024-11-21 DIAGNOSIS — R112 Nausea with vomiting, unspecified: Secondary | ICD-10-CM | POA: Diagnosis not present

## 2024-11-21 DIAGNOSIS — R109 Unspecified abdominal pain: Secondary | ICD-10-CM | POA: Diagnosis not present

## 2024-11-21 DIAGNOSIS — K2970 Gastritis, unspecified, without bleeding: Secondary | ICD-10-CM | POA: Diagnosis not present

## 2024-11-21 NOTE — Discharge Summary (Signed)
 Physician Discharge Summary  [name] FMW:[id number] DOB: 1986-02-28 DOA: 11/16/2024  PCP: Myra Geni ORN, FNP  Admit date: 11/16/2024 Discharge date: 11/21/2024  Admitted From: Home Disposition: Home  Recommendations for Outpatient Follow-up:  Follow up with PCP in 1 week  Call provider or return to the ED if worsening nausea, vomiting and abdominal pain.  Avoid NSAIDs. Follow-up with GI regarding pathology from EGD and ongoing management and evaluation.  Potentially gastric emptying study.  Home Health: No Equipment/Devices: None  Discharge Condition: Stable CODE STATUS: Full Diet recommendation: Heart healthy  Brief/Interim Summary:  Sharon Rowland is a 38 y.o. female with medical history significant for gastroparesis, GERD, chronic abdominal pain, Ehlers Danlos syndrome, asthma, hypothyroidism, mast cell activation syndrome, POTS, lupus, Sjogren syndrome, status post lap chole, states she has had a total of 25 surgeries, who presents to the ER due to intermittent abdominal pain for the past 3 weeks associated with intractable nausea and vomiting for the past 2 days.  Since October she has lost 12 pounds in result of this.  No fever or chills. In the ER patient was tachycardic, tachypneic and hypervolemic on exam.  UA negative for pyuria.  Patient received IV fluid bolus, IV analgesics and IV antibiotics. CT abdomen pelvis with contrast revealed liquid stool in the colon, possibly indicating viral enterocolitis.  Patient admitted for hydration, symptom management with ongoing nausea and vomiting.   Patient was treated with scopolamine  patch, intermittent Phenergan , IV Benadryl , IV Toradol  for pain. Evaluated by GI and underwent upper GI endoscopy on 11/29, gastritis noted, normal esophagus and duodenum.  Biopsy taken.  Patient had some improvement in symptoms however not completely resolved.  Her symptoms could be related to mast cell activation.  She was discharged home, she  will continue to follow-up with her outpatient providers.  Potentially gastric emptying studies could be performed.  She is advised to avoid NSAIDs.  She will continue omeprazole  at home.     Discharge Diagnoses:  Principal Problem:   Intractable nausea and vomiting Active Problems:   Paroxysmal SVT (supraventricular tachycardia) (HCC)   Mast cell activation syndrome   Ehlers-Danlos syndrome   Chronic pain    Discharge Instructions  Discharge Instructions     Call MD for:  persistant nausea and vomiting   Complete by: As directed    Call MD for:  severe uncontrolled pain   Complete by: As directed    Diet general   Complete by: As directed    Discharge instructions   Complete by: As directed    1. Continue prior to admission medications 2. Diet as tolerated   Increase activity slowly   Complete by: As directed       Allergies as of 11/21/2024       Reactions   Apremilast Dermatitis, Rash, Other (See Comments)   Mood changes Hallucination DMAR  Pt stated this med makes her depression worse, makes her angry.   Cosentyx [secukinumab] Anaphylaxis   Fentanyl  Itching, Dermatitis, Rash, Other (See Comments)   Wheezing    Hydroxychloroquine Palpitations, Other (See Comments)   Prolonged QT Irregular heart rate   Metoclopramide  Other (See Comments)   DO NOT GIVE VIA IV (can take PO) Causes Tremors, Jerking, Tachycardia   Other Rash   Lidocaine  Patches Adhesive Burn Scar Skin    Prochlorperazine  Other (See Comments)   DO NOT GIVE VIA IV  (can take PO) Causes Tremors, Jerking, Tachycardia   Semaglutide Dermatitis   Silver  Hives, Dermatitis, Rash  Tape Hives, Swelling, Rash   Use paper tape Angioedema   Latex Dermatitis   Zofran  [ondansetron  Hcl] Other (See Comments)   Prolonged QT   Ezetimibe Nausea And Vomiting, Nausea Only        Medication List     TAKE these medications    albuterol  108 (90 Base) MCG/ACT inhaler Commonly known as: VENTOLIN   HFA Inhale 1-2 puffs into the lungs every 6 (six) hours as needed for wheezing or shortness of breath.   betamethasone dipropionate 0.05 % cream Apply 1 Application topically 2 (two) times daily as needed (psoriasis).   botulinum toxin Type A 200 units injection Commonly known as: BOTOX Inject 200 Units as directed every 3 (three) months.   buPROPion  300 MG 24 hr tablet Commonly known as: WELLBUTRIN  XL Take 1 tablet by mouth daily.   calcium  carbonate 750 MG chewable tablet Commonly known as: TUMS EX Chew 1 tablet by mouth as needed for heartburn.   cholecalciferol 25 MCG (1000 UNIT) tablet Commonly known as: VITAMIN D3 Take 1,000 Units by mouth daily.   clobetasol cream 0.05 % Commonly known as: TEMOVATE Apply 1 Application topically daily as needed (psoriasis).   desonide 0.05 % ointment Commonly known as: DESOWEN Apply 1 application  topically 2 (two) times daily as needed (psoriasis).   diclofenac  50 MG EC tablet Commonly known as: VOLTAREN  Take 2 tablets by mouth twice daily.   estradiol  0.1 MG/24HR patch Commonly known as: Vivelle -Dot Place 1 patch (0.1 mg total) onto the skin 2 (two) times a week.   FIBER PO Take 20 mg by mouth daily.   levothyroxine  88 MCG tablet Commonly known as: SYNTHROID  Take 88 mcg by mouth daily before breakfast.   methylphenidate  10 MG ER tablet TAKE ONE TABLET BY MOUTH IN THE MORNING   multivitamin with minerals Tabs tablet Take 1 tablet by mouth daily.   nystatin cream Commonly known as: MYCOSTATIN Apply 1 Application topically 2 (two) times daily as needed for dry skin (yeast).   pantoprazole  40 MG tablet Commonly known as: PROTONIX  Take 1 tablet (40 mg total) by mouth 2 (two) times daily.   pregabalin  100 MG capsule Commonly known as: LYRICA  Take 1 capsule (100 mg total) by mouth 2 (two) times daily.   Premarin  vaginal cream Generic drug: conjugated estrogens  Insert 1 gram vaginally nightly.   promethazine  25 MG  tablet Commonly known as: PHENERGAN  Take 1 tablet (25 mg total) by mouth every 6 (six) hours as needed for nausea or vomiting.   promethazine  25 MG suppository Commonly known as: PHENERGAN  Place 1 suppository (25 mg total) rectally every 6 (six) hours as needed for nausea or vomiting.   Qulipta  60 MG Tabs Generic drug: Atogepant  Take 60 mg by mouth daily.   scopolamine  1 MG/3DAYS Commonly known as: TRANSDERM-SCOP Place 1 patch (1.5 mg total) onto the skin every 3 (three) days.   Tremfya 100 MG/ML pen Generic drug: guselkumab Inject 1 Dose into the skin every 30 (thirty) days.   tretinoin 0.025 % cream Commonly known as: RETIN-A Apply topically at bedtime.        Follow-up Information     Myra Geni ORN, FNP. Schedule an appointment as soon as possible for a visit in 1 week(s).   Specialty: Family Medicine Contact information: [address] [place] [address] [phone]                Allergies  Allergen Reactions   Apremilast Dermatitis, Rash and Other (See Comments)  Mood changes Hallucination DMAR  Pt stated this med makes her depression worse, makes her angry.   Cosentyx [Secukinumab] Anaphylaxis   Fentanyl  Itching, Dermatitis, Rash and Other (See Comments)    Wheezing    Hydroxychloroquine Palpitations and Other (See Comments)    Prolonged QT Irregular heart rate   Metoclopramide  Other (See Comments)    DO NOT GIVE VIA IV (can take PO) Causes Tremors, Jerking, Tachycardia   Other Rash    Lidocaine  Patches Adhesive Burn Scar Skin    Prochlorperazine  Other (See Comments)    DO NOT GIVE VIA IV  (can take PO) Causes Tremors, Jerking, Tachycardia   Semaglutide Dermatitis   Silver  Hives, Dermatitis and Rash   Tape Hives, Swelling and Rash    Use paper tape Angioedema   Latex Dermatitis   Zofran  [Ondansetron  Hcl] Other (See Comments)    Prolonged QT   Ezetimibe Nausea And Vomiting and Nausea Only     Consultations:    Procedures/Studies: CT ABDOMEN PELVIS W CONTRAST Result Date: 11/16/2024 EXAM: CT ABDOMEN AND PELVIS WITH CONTRAST 11/16/2024 09:01:17 PM TECHNIQUE: CT of the abdomen and pelvis was performed with the administration of 100 mL of iohexol  (OMNIPAQUE ) 300 MG/ML solution. Multiplanar reformatted images are provided for review. Automated exposure control, iterative reconstruction, and/or weight-based adjustment of the mA/kV was utilized to reduce the radiation dose to as low as reasonably achievable. COMPARISON: Comparison with 09/11/2024. CLINICAL HISTORY: Abdominal pain, acute, nonlocalized. FINDINGS: LOWER CHEST: Mild dependent atelectasis in the lung bases. LIVER: Mild diffuse fatty infiltration of the liver. GALLBLADDER AND BILE DUCTS: Surgical absence of the gallbladder. No biliary ductal dilatation. SPLEEN: No acute abnormality. PANCREAS: No acute abnormality. ADRENAL GLANDS: No acute abnormality. KIDNEYS, URETERS AND BLADDER: No stones in the kidneys or ureters. No hydronephrosis. No perinephric or periureteral stranding. Urinary bladder is unremarkable. GI AND BOWEL: Stomach demonstrates no acute abnormality. Liquid stool in the colon. There is no bowel obstruction. APPENDIX: The appendix is normal. PERITONEUM AND RETROPERITONEUM: No ascites. No free air. VASCULATURE: Aorta is normal in caliber. LYMPH NODES: No lymphadenopathy. REPRODUCTIVE ORGANS: The uterus is surgically absent. BONES AND SOFT TISSUES: No acute osseous abnormality. No focal soft tissue abnormality. IMPRESSION: 1. Liquid stool in the colon, possibly indicating viral enterocolitis. 2. No evidence of obstruction. Electronically signed by: Elsie Gravely MD 11/16/2024 09:08 PM EST RP Workstation: [id number]   US  THYROID  Result Date: 11/10/2024 CLINICAL DATA:  Multinodular goiter. Thyromegaly. Sore throat. Dysphagia. EXAM: THYROID  ULTRASOUND TECHNIQUE: Ultrasound examination of the thyroid  gland and adjacent soft  tissues was performed. COMPARISON:  CT neck with contrast FINDINGS: Parenchymal Echotexture: Moderately heterogeneous Isthmus: 0.3 cm Right lobe: 5.2 x 1.6 x 1.6 cm Left lobe: 5.4 x 1.7 x 1.8 cm _________________________________________________________ Estimated total number of nodules >/= 1 cm: 0 Number of spongiform nodules >/=  2 cm not described below (TR1): 0 Number of mixed cystic and solid nodules >/= 1.5 cm not described below (TR2): 0 _________________________________________________________ No discrete nodules are seen within the thyroid  gland. IMPRESSION: Moderate diffuse heterogeneity of the thyroid  parenchyma without discrete nodule. The above is in keeping with the ACR TI-RADS recommendations - J Am Coll Radiol 2017;14:587-595. Electronically Signed   By: Aliene Lloyd M.D.   On: 11/10/2024 13:57      Subjective:   Discharge Exam: Vitals:   11/20/24 2022 11/21/24 0500  BP: (!) 127/90 (!) 92/53  Pulse: 83 97  Resp: 18 16  Temp: 98 F (36.7 C) 98.1 F (36.7 C)  SpO2:  97% 97%    General: Pt is alert, awake, not in acute distress Cardiovascular: rate controlled, S1/S2 + Respiratory: bilateral decreased breath sounds at bases Abdominal: Soft, NT, ND, bowel sounds + Extremities: no edema, no cyanosis    The results of significant diagnostics from this hospitalization (including imaging, microbiology, ancillary and laboratory) are listed below for reference.     Microbiology: No results found for this or any previous visit (from the past 240 hours).   Labs: BNP (last 3 results) No results for input(s): BNP in the last 8760 hours. Basic Metabolic Panel: Recent Labs  Lab 11/16/24 1930 11/16/24 1950 11/17/24 0540 11/20/24 0658  NA  --  139 142 144  K  --  3.5 3.6 3.8  CL  --  102 108 107  CO2  --  26 26 29   GLUCOSE  --  97 92 84  BUN  --  <5* <5* 7  CREATININE  --  0.71 0.71 0.78  CALCIUM   --  9.5 8.6* 8.9  MG 2.5*  --   --  2.2  PHOS 2.6  --   --   --     Liver Function Tests: Recent Labs  Lab 11/16/24 1950  AST <10*  ALT 13  ALKPHOS 85  BILITOT 0.4  PROT 7.4  ALBUMIN 4.9   Recent Labs  Lab 11/16/24 1950  LIPASE 19   No results for input(s): AMMONIA in the last 168 hours. CBC: Recent Labs  Lab 11/16/24 1950 11/17/24 0540 11/20/24 0658  WBC 8.5 6.3 6.6  NEUTROABS 4.8  --  2.0  HGB 13.0 11.4* 11.2*  HCT 38.7 35.3* 34.5*  MCV 80.8 83.3 83.5  PLT 323 273 271   Cardiac Enzymes: No results for input(s): CKTOTAL, CKMB, CKMBINDEX, TROPONINI in the last 168 hours. BNP: Invalid input(s): POCBNP CBG: No results for input(s): GLUCAP in the last 168 hours. D-Dimer No results for input(s): DDIMER in the last 72 hours. Hgb A1c No results for input(s): HGBA1C in the last 72 hours. Lipid Profile No results for input(s): CHOL, HDL, LDLCALC, TRIG, CHOLHDL, LDLDIRECT in the last 72 hours. Thyroid  function studies No results for input(s): TSH, T4TOTAL, T3FREE, THYROIDAB in the last 72 hours.  Invalid input(s): FREET3 Anemia work up No results for input(s): VITAMINB12, FOLATE, FERRITIN, TIBC, IRON, RETICCTPCT in the last 72 hours. Urinalysis    Component Value Date/Time   COLORURINE STRAW (A) 11/16/2024 1824   APPEARANCEUR CLEAR 11/16/2024 1824   APPEARANCEUR Clear 02/13/2017 1007   LABSPEC 1.003 (L) 11/16/2024 1824   PHURINE 6.0 11/16/2024 1824   GLUCOSEU NEGATIVE 11/16/2024 1824   HGBUR NEGATIVE 11/16/2024 1824   BILIRUBINUR NEGATIVE 11/16/2024 1824   BILIRUBINUR Negative 02/13/2017 1007   KETONESUR 5 (A) 11/16/2024 1824   PROTEINUR NEGATIVE 11/16/2024 1824   UROBILINOGEN 1 11/09/2013 1053   NITRITE NEGATIVE 11/16/2024 1824   LEUKOCYTESUR NEGATIVE 11/16/2024 1824   Sepsis Labs Recent Labs  Lab 11/16/24 1950 11/17/24 0540 11/20/24 0658  WBC 8.5 6.3 6.6   Microbiology No results found for this or any previous visit (from the past 240 hours).   Time  coordinating discharge: 35 minutes  SIGNED:   Deronte Solis, MD  [facility] 11/21/2024, 4:43 PM

## 2024-11-21 NOTE — Progress Notes (Signed)
 Sharon Rowland, M.D. Gastroenterology & Hepatology   Interval History:  Patient doing well, has been able to tolerate some oral intake meals.  Denies any abdominal pain or distention.  Still slightly nauseated but has been able to tolerate her meals without vomiting.  Inpatient Medications:  Current Facility-Administered Medications:    albuterol  (PROVENTIL ) (2.5 MG/3ML) 0.083% nebulizer solution 3 mL, 3 mL, Inhalation, Q6H PRN, Hall, Sharon N, DO   alum & mag hydroxide-simeth (MAALOX/MYLANTA) 200-200-20 MG/5ML suspension 30 mL, 30 mL, Oral, Once, Barrett, Sharon N, PA-C   Atogepant  TABS 60 mg, 60 mg, Oral, Daily, Sigdel, Santosh, MD   buPROPion  (WELLBUTRIN  XL) 24 hr tablet 300 mg, 300 mg, Oral, Daily, Sigdel, Santosh, MD, 300 mg at 11/21/24 0914   calcium  carbonate (TUMS - dosed in mg elemental calcium ) chewable tablet 200 mg of elemental calcium , 1 tablet, Oral, PRN, Sigdel, Santosh, MD, 200 mg of elemental calcium  at 11/19/24 2044   diphenhydrAMINE  (BENADRYL ) injection 50 mg, 50 mg, Intravenous, Q8H PRN, Sigdel, Santosh, MD, 50 mg at 11/21/24 9488   doxylamine (Sleep) (UNISOM) tablet 25 mg, 25 mg, Oral, QHS PRN, Sigdel, Santosh, MD   enoxaparin  (LOVENOX ) injection 40 mg, 40 mg, Subcutaneous, Q24H, Hall, Sharon N, DO, 40 mg at 11/21/24 9485   estradiol  (CLIMARA  - Dosed in mg/24 hr) patch 0.05 mg, 0.05 mg, Transdermal, Weekly, Sigdel, Santosh, MD, 0.05 mg at 11/18/24 1439   feeding supplement (ENSURE PLUS HIGH PROTEIN) liquid 237 mL, 237 mL, Oral, BID BM, Sigdel, Santosh, MD, 237 mL at 11/20/24 1400   levothyroxine  (SYNTHROID ) tablet 88 mcg, 88 mcg, Oral, Q0600, Sharon Terry SAILOR, DO, 88 mcg at 11/21/24 0513   melatonin tablet 6 mg, 6 mg, Oral, QHS PRN, Sharon Terry N, DO, 6 mg at 11/20/24 2237   methylphenidate  (RITALIN ) tablet 5 mg, 5 mg, Oral, BID WC, Sigdel, Santosh, MD, 5 mg at 11/21/24 0913   multivitamin with minerals tablet 1 tablet, 1 tablet, Oral, Daily, Sharon, Sharon N, DO, 1 tablet  at 11/21/24 9085   pantoprazole  (PROTONIX ) injection 40 mg, 40 mg, Intravenous, Q12H, Sharon Rowland, [place], DO, 40 mg at 11/21/24 0920   polyethylene glycol (MIRALAX  / GLYCOLAX ) packet 17 g, 17 g, Oral, Daily PRN, Hall, Sharon N, DO   pregabalin  (LYRICA ) capsule 100 mg, 100 mg, Oral, Daily, Hall, Sharon N, DO, 100 mg at 11/21/24 9085   pyridOXINE (VITAMIN B6) tablet 50 mg, 50 mg, Oral, Daily, Sigdel, Santosh, MD, 50 mg at 11/21/24 9082   scopolamine  (TRANSDERM-SCOP) 1 MG/3DAYS 1 mg, 1 patch, Transdermal, Q72H, Sigdel, Santosh, MD, 1 mg at 11/18/24 1436   I/O    Intake/Output Summary (Last 24 hours) at 11/21/2024 1015 Last data filed at 11/21/2024 0513 Gross per 24 hour  Intake 487.91 ml  Output --  Net 487.91 ml     Physical Exam: Temp:  [97.6 F (36.4 C)-98.1 F (36.7 C)] 98.1 F (36.7 C) (11/30 0500) Pulse Rate:  [81-97] 97 (11/30 0500) Resp:  [16-18] 16 (11/30 0500) BP: (92-127)/(53-90) 92/53 (11/30 0500) SpO2:  [97 %-100 %] 97 % (11/30 0500)  Temp (24hrs), Avg:97.9 F (36.6 C), Min:97.6 F (36.4 C), Max:98.1 F (36.7 C)  GENERAL: The patient is AO x3, in no acute distress. HEENT: Head is normocephalic and atraumatic. EOMI are intact. Mouth is well hydrated and without lesions. NECK: Supple. No masses LUNGS: Clear to auscultation. No presence of rhonchi/wheezing/rales. Adequate chest expansion HEART: RRR, normal s1 and s2. ABDOMEN: Soft, nontender, no guarding, no peritoneal signs, and nondistended.  BS +. No masses. EXTREMITIES: Without any cyanosis, clubbing, rash, lesions or edema. NEUROLOGIC: AOx3, no focal motor deficit. SKIN: no jaundice, no rashes  Laboratory Data: CBC:     Component Value Date/Time   WBC 6.6 11/20/2024 0658   RBC 4.13 11/20/2024 0658   HGB 11.2 (L) 11/20/2024 0658   HGB 11.7 10/17/2022 1122   HCT 34.5 (L) 11/20/2024 0658   HCT 35.3 10/17/2022 1122   PLT 271 11/20/2024 0658   PLT 306 10/17/2022 1122   MCV 83.5 11/20/2024 0658   MCV 81  10/17/2022 1122   MCH 27.1 11/20/2024 0658   MCHC 32.5 11/20/2024 0658   RDW 14.4 11/20/2024 0658   RDW 13.9 10/17/2022 1122   LYMPHSABS 3.8 11/20/2024 0658   LYMPHSABS 2.5 10/17/2022 1122   MONOABS 0.6 11/20/2024 0658   EOSABS 0.1 11/20/2024 0658   EOSABS 0.1 10/17/2022 1122   BASOSABS 0.1 11/20/2024 0658   BASOSABS 0.1 10/17/2022 1122   COAG:  Lab Results  Component Value Date   INR 1.0 04/27/2019   INR 0.99 01/05/2015    BMP:     Latest Ref Rng & Units 11/20/2024    6:58 AM 11/17/2024    5:40 AM 11/16/2024    7:50 PM  BMP  Glucose 70 - 99 mg/dL 84  92  97   BUN 6 - 20 mg/dL 7  <5  <5   Creatinine 0.44 - 1.00 mg/dL 9.21  9.28  9.28   Sodium 135 - 145 mmol/L 144  142  139   Potassium 3.5 - 5.1 mmol/L 3.8  3.6  3.5   Chloride 98 - 111 mmol/L 107  108  102   CO2 22 - 32 mmol/L 29  26  26    Calcium  8.9 - 10.3 mg/dL 8.9  8.6  9.5     HEPATIC:     Latest Ref Rng & Units 11/16/2024    7:50 PM 07/12/2024   12:48 AM 05/23/2024    4:26 PM  Hepatic Function  Total Protein 6.5 - 8.1 g/dL 7.4  7.4  7.7   Albumin 3.5 - 5.0 g/dL 4.9  4.2  4.5   AST 15 - 41 U/L 10  10  10    ALT 0 - 44 U/L 13  16  14    Alk Phosphatase 38 - 126 U/L 85  80  66   Total Bilirubin 0.0 - 1.2 mg/dL 0.4  0.5  0.6     CARDIAC:  Lab Results  Component Value Date   TROPONINI <0.03 05/16/2019      Imaging: I personally reviewed and interpreted the available labs, imaging and endoscopic files.   Assessment/Plan: Sharon Rowland is a 38 y.o. female with medical history significant for gastroparesis, GERD, chronic abdominal pain, Ehlers Danlos syndrome, asthma, hypothyroidism, mast cell activation syndrome, POTS, lupus, Sjogren syndrome, presumed gastroparesis , chronic NSAID use, who came to the hospital after presenting with worsening episodes of nausea and vomiting for the last 3 weeks.   Patient presented recurrent episodes of nausea and vomiting with some abdominal pain.  Unclear if this is  related to recent switch of PPI for lower potency PPI and intermittent use of NSAIDs.  I encouraged the patient to switch to her regular omeprazole  once she is discharged home.  EGD performed yesterday showed presence of some mild gastritis but no other abnormalities, biopsies were performed.  It is important to consider that EDS/POTS/mast cell disorder could be having a role in her symptoms,  which will need chronic management as outpatient.   Notably, she reported history of gastroparesis but never had a gastric and study (patient did not pursue this testing as she did not think it would change her management).  I discussed with her that if she indeed has gastroparesis, there are some potential options different from Reglan  for treatment but this (domperidone, prucalopride) will need to be confirmed with a gastric emptying study as outpatient.   -Continue with scopolamine  patches - Phenergan  12.5 mg every 8 hours PO PRN for refractory nausea - Can try using doxylamine and piridoxine as needed if refractory nausea - Pantoprazole  40 mg BID - Switch back to omeprazole  40 mg daily upon discharge - Try to avoid NSAIDs as much as possible - Consider GES as outpatient if refractory symptoms -Follow-up pathology results - GI service will sign-off, please call us  back if you have any more questions.  Sharon Fortune, MD Gastroenterology and Hepatology [facility]

## 2024-11-21 NOTE — Plan of Care (Signed)
  Problem: Education: Goal: Knowledge of General Education information will improve Description: Including pain rating scale, medication(s)/side effects and non-pharmacologic comfort measures Outcome: Progressing   Problem: Clinical Measurements: Goal: Ability to maintain clinical measurements within normal limits will improve Outcome: Progressing Goal: Diagnostic test results will improve Outcome: Progressing   Problem: Nutrition: Goal: Adequate nutrition will be maintained Outcome: Progressing   Problem: Elimination: Goal: Will not experience complications related to urinary retention Outcome: Progressing   Problem: Pain Managment: Goal: General experience of comfort will improve and/or be controlled Outcome: Progressing   Problem: Safety: Goal: Ability to remain free from injury will improve Outcome: Progressing

## 2024-11-22 ENCOUNTER — Encounter (HOSPITAL_COMMUNITY): Payer: Self-pay | Admitting: Gastroenterology

## 2024-11-23 ENCOUNTER — Ambulatory Visit (INDEPENDENT_AMBULATORY_CARE_PROVIDER_SITE_OTHER): Payer: Self-pay | Admitting: Gastroenterology

## 2024-11-23 LAB — SURGICAL PATHOLOGY

## 2024-11-24 NOTE — Progress Notes (Signed)
 procedure note and pathology result faxed to PCP

## 2024-11-29 ENCOUNTER — Telehealth: Payer: Self-pay | Admitting: *Deleted

## 2024-11-29 NOTE — Telephone Encounter (Signed)
 Surgical Date: 12/02/2024 Procedure: XI ROBOTIC ASSISTED UMBILICAL HERNIA REPAIR W/ MESH  Received call from patient [phone]~ telephone.   Patient reports recent hospitalization with gastritis/ gastroparesis. Reports that after EGD she was advised to avoid oral NSAID's. States that since she has had multiple surgeries, oral narcotics do not work as well for her. States that she generally uses oral NSAID's to tolerate post-operative pain, but since she is unable to do so, requested injectable toradol . Also states that gastroparesis worsens after surgery.   Dr. Evonnie made aware of patient request. States that patient can still use narcotics and APAP to manage pain.   Of note, patient is currently on Reglan  for gastroparesis, but GI requested gastric emptying study prior to changing medication.   Alternative includes delay of hernia repair until gastroparesis is better controlled.

## 2024-11-30 ENCOUNTER — Encounter (HOSPITAL_COMMUNITY): Payer: Self-pay

## 2024-11-30 ENCOUNTER — Encounter (HOSPITAL_COMMUNITY): Admission: RE | Admit: 2024-11-30 | Discharge: 2024-11-30 | Attending: Surgery

## 2024-11-30 NOTE — Patient Instructions (Signed)
 Sharon Rowland  11/30/2024     @PREFPERIOPPHARMACY @   Your procedure is scheduled on 12/02/2024.   Report to [facility] at 6:00 A.M.   Call this number if you have problems the morning of surgery:  [phone]  If you experience any cold or flu symptoms such as cough, fever, chills, shortness of breath, etc. between now and your scheduled surgery, please notify us  at the above number.   Remember:  Do not eat or drink after midnight.       Take these medicines the morning of surgery with A SIP OF WATER  : Bupropion  Levothyroxine  pantoprazole  Quilipta     Do not wear jewelry, make-up or nail polish, including gel polish,  artificial nails, or any other type of covering on natural nails (fingers and  toes).  Do not wear lotions, powders, or perfumes, or deodorant.  Do not shave 48 hours prior to surgery.  Men may shave face and neck.  Do not bring valuables to the hospital.  [facility] is not responsible for any belongings or valuables.  Contacts, dentures or bridgework may not be worn into surgery.  Leave your suitcase in the car.  After surgery it may be brought to your room.  For patients admitted to the hospital, discharge time will be determined by your treatment team.  Patients discharged the day of surgery will not be allowed to drive home.   Name and phone number of your driver:   Family  Special instructions:  N/A  Please read over the following fact sheets that you were given. Care and Recovery After Surgery   Laparoscopic Surgery for Belly Hernias: What to Expect  Laparoscopic surgery for belly hernias is a procedure to treat a bulge of tissue that pushes through a weak area of the belly (ventral hernia). This procedure may be done right away if part of your intestine gets trapped inside the hernia and starts to lose its blood supply (strangulation). Laparoscopic surgery is done through small cuts using a scope with a light and camera  (laparoscope). Tell a health care provider about: Any allergies you have. All medicines you're taking. These include vitamins, herbs, eye drops, creams, and over-the-counter medicines. Any problems you or family members have had with anesthesia. Any bleeding problems you have. Any surgeries you've had. Any medical conditions you have. Whether you're pregnant or may be pregnant. What are the risks? Your health care provider will talk with you about risks. These may include: Infection. Bleeding or blood clots. Damage to nearby structures in the belly. Trouble pooping or peeing. The hernia coming back after surgery. Allergy to the mesh, if a mesh was used. Fluid buildup in the area of the hernia. In some cases, your provider may need to switch from a laparoscopic procedure to a procedure that's done through a single, larger incision in the belly (open procedure). You may need an open procedure if: You have a hernia that's hard to repair. Your organs are hard to see with the laparoscope. You have bleeding problems during the laparoscopic procedure. What happens before the procedure? When to stop eating and drinking Eat and drink only as you've been told. You may be told this: 8 hours before your surgery Stop eating most foods. Do not eat meat, fried foods, or fatty foods. Eat only light foods, such as toast or crackers. All liquids are OK except energy drinks and alcohol. 6 hours before your surgery Stop eating. Drink only clear liquids, such as water ,  clear fruit juice, black coffee, plain tea, and sports drinks. Do not drink energy drinks or alcohol. 2 hours before your surgery Stop drinking all liquids. You may be allowed to take medicines with small sips of water . If you do not eat and drink as told, your surgery may be delayed or canceled. Medicines Ask about changing or stopping: Any medicines you take. Any vitamins, herbs, or supplements you take. Do not take aspirin  or  ibuprofen  unless you're told to. Tests You may have an exam or testing, including: Blood tests. Pee tests. Ultrasound of the belly. Chest X-ray. Electrocardiogram (ECG). Surgery safety For your safety, you may: Need to wash your skin with a soap that kills germs. Get antibiotics. Have your surgery site marked. Have hair removed at the surgery site. General instructions Do not smoke, vape, or use nicotine or tobacco for at least 4 weeks before the surgery. Ask if you'll be staying overnight in the hospital. If you'll be going home right after the procedure, plan to have a responsible adult: Drive you home from the hospital or clinic. You won't be allowed to drive. Stay with you for the time you're told. What happens during the procedure?  An IV will be put into a vein in your hand or arm. You may be given: A sedative to help you relax. Anesthesia to keep you from feeling pain. Many small cuts (incisions) will be made in your belly. Gas will be pumped into your belly through one of the cuts. This will make it easier for your surgeon to see inside your belly during the repair. A laparoscope will be inserted into your belly. This will send pictures to a monitor in the operating room. The instruments needed for the surgery will be placed through the other cuts. The tissue or intestines that make up the hernia will be moved back into place. The edges of the hernia may be stitched together. A piece of mesh may be used to close the hernia. Stitches, clips, or staples will be used to keep the mesh in place. Your cuts will be closed with stitches, skin glue, or tape strips. They may be covered with a bandage. These steps may vary. Ask what you can expect. What happens after the procedure? You will be watched closely until you leave. This includes checking your pain level, blood pressure, heart rate, and breathing rate. You will continue to receive fluids and medicines through an IV. Your IV  will be removed when you can drink clear fluids. This information is not intended to replace advice given to you by your health care provider. Make sure you discuss any questions you have with your health care provider. Document Revised: 06/17/2023 Document Reviewed: 06/17/2023 Elsevier Patient Education  2024 [employer]  General Anesthesia, Adult General anesthesia is the use of medicine to make you fall asleep (unconscious) for a medical procedure. General anesthesia must be used for certain procedures. It is often recommended for surgery or procedures that: Last a long time. Require you to be still or in an unusual position. Are major and can cause blood loss. Affect your breathing. The medicines used for general anesthesia are called general anesthetics. During general anesthesia, these medicines are given along with medicines that: Prevent pain. Control your blood pressure. Relax your muscles. Prevent nausea and vomiting after the procedure. Tell a health care provider about: Any allergies you have. All medicines you are taking, including vitamins, herbs, eye drops, creams, and over-the-counter medicines. Your history of any:  Medical conditions you have, including: High blood pressure. Bleeding problems. Diabetes. Heart or lung conditions, such as: Heart failure. Sleep apnea. Asthma. Chronic obstructive pulmonary disease (COPD). Current or recent illnesses, such as: Upper respiratory, chest, or ear infections. Cough or fever. Tobacco or drug use, including marijuana or alcohol use. Depression or anxiety. Surgeries and types of anesthetics you have had. Problems you or family members have had with anesthetic medicines. Whether you are pregnant or may be pregnant. Whether you have any chipped or loose teeth, dentures, caps, bridgework, or issues with your mouth, swallowing, or choking. What are the risks? Your health care provider will talk with you about risks. These  may include: Allergic reaction to the medicines. Lung and heart problems. Inhaling food or liquid from the stomach into the lungs (aspiration). Nerve injury. Injury to the lips, mouth, teeth, or gums. Stroke. Waking up during your procedure and being unable to move. This is rare. These problems are more likely to develop if you are having a major surgery or if you have an advanced or serious medical condition. You can prevent some of these complications by answering all of your health care provider's questions thoroughly and by following all instructions before your procedure. General anesthesia can cause side effects, including: Nausea or vomiting. A sore throat or hoarseness from the breathing tube. Wheezing or coughing. Shaking chills or feeling cold. Body aches. Sleepiness. Confusion, agitation (delirium), or anxiety. What happens before the procedure? When to stop eating and drinking Follow instructions from your health care provider about what you may eat and drink before your procedure. If you do not follow your health care provider's instructions, your procedure may be delayed or canceled. Medicines Ask your health care provider about: Changing or stopping your regular medicines. These include any diabetes medicines or blood thinners you take. Taking medicines such as aspirin  and ibuprofen . These medicines can thin your blood. Do not take them unless your health care provider tells you to. Taking over-the-counter medicines, vitamins, herbs, and supplements. General instructions Do not use any products that contain nicotine or tobacco for at least 4 weeks before the procedure. These products include cigarettes, chewing tobacco, and vaping devices, such as e-cigarettes. If you need help quitting, ask your health care provider. If you brush your teeth on the morning of the procedure, make sure to spit out all of the water  and toothpaste. If told by your health care provider, bring  your sleep apnea device with you to surgery (if applicable). If you will be going home right after the procedure, plan to have a responsible adult: Take you home from the hospital or clinic. You will not be allowed to drive. Care for you for the time you are told. What happens during the procedure?  An IV will be inserted into one of your veins. You will be given one or more of the following through a face mask or IV: A sedative. This helps you relax. Anesthesia. This will: Numb certain areas of your body. Make you fall asleep for surgery. After you are unconscious, a breathing tube may be inserted down your throat to help you breathe. This will be removed before you wake up. An anesthesia provider, such as an anesthesiologist, will stay with you throughout your procedure. The anesthesia provider will: Keep you comfortable and safe by continuing to give you medicines and adjusting the amount of medicine that you get. Monitor your blood pressure, heart rate, and oxygen levels to make sure that the anesthetics do  not cause any problems. The procedure may vary among health care providers and hospitals. What happens after the procedure? Your blood pressure, temperature, heart rate, breathing rate, and blood oxygen level will be monitored until you leave the hospital or clinic. You will wake up in a recovery area. You may wake up slowly. You may be given medicine to help you with pain, nausea, or any other side effects from the anesthesia. Summary General anesthesia is the use of medicine to make you fall asleep (unconscious) for a medical procedure. Follow your health care provider's instructions about when to stop eating, drinking, or taking certain medicines before your procedure. Plan to have a responsible adult take you home from the hospital or clinic. This information is not intended to replace advice given to you by your health care provider. Make sure you discuss any questions you have  with your health care provider. Document Revised: 03/07/2022 Document Reviewed: 03/07/2022 Elsevier Patient Education  2024 [employer]  How to Use Chlorhexidine  at Home in the Shower Chlorhexidine  gluconate (CHG) is a germ-killing (antiseptic) wash that's used to clean the skin. It can get rid of the germs that normally live on the skin and can keep them away for about 24 hours. If you're having surgery, you may be told to shower with CHG at home the night before surgery. This can help lower your risk for infection. To use CHG wash in the shower, follow the steps below. Supplies needed: CHG body wash. Clean washcloth. Clean towel. How to use CHG in the shower Follow these steps unless you're told to use CHG in a different way: Start the shower. Use your normal soap and shampoo to wash your face and hair. Turn off the shower or move out of the shower stream. Pour CHG onto a clean washcloth. Do not use any type of brush or rough sponge. Start at your neck, washing your body down to your toes. Make sure you: Wash the part of your body where the surgery will be done for at least 1 minute. Do not scrub. Do not use CHG on your head or face unless your health care provider tells you to. If it gets into your ears or eyes, rinse them well with water . Do not wash your genitals with CHG. Wash your back and under your arms. Make sure to wash skin folds. Let the CHG sit on your skin for 1-2 minutes or as long as told. Rinse your entire body in the shower, including all body creases and folds. Turn off the shower. Dry off with a clean towel. Do not put anything on your skin afterward, such as powder, lotion, or perfume. Put on clean clothes or pajamas. If it's the night before surgery, sleep in clean sheets. General tips Use CHG only as told, and follow the instructions on the label. Use the full amount of CHG as told. This is often one bottle. Do not smoke and stay away from flames after  using CHG. Your skin may feel sticky after using CHG. This is normal. The sticky feeling will go away as the CHG dries. Do not use CHG: If you have a chlorhexidine  allergy or have reacted to chlorhexidine  in the past. On open wounds or areas of skin that have broken skin, cuts, or scrapes. On babies younger than 84 months of age. Contact a health care provider if: You have questions about using CHG. Your skin gets irritated or itchy. You have a rash after using CHG. You swallow  any CHG. Call your local poison control center [phone] in the [place] Your eyes itch badly, or they become very red or swollen. Your hearing changes. You have trouble seeing. If you can't reach your provider, go to an urgent care or emergency room. Do not drive yourself. Get help right away if: You have swelling or tingling in your mouth or throat. You make high-pitched whistling sounds when you breathe, most often when you breathe out (wheeze). You have trouble breathing. These symptoms may be an emergency. Call 911 right away. Do not wait to see if the symptoms will go away. Do not drive yourself to the hospital. This information is not intended to replace advice given to you by your health care provider. Make sure you discuss any questions you have with your health care provider. Document Revised: 06/24/2023 Document Reviewed: 06/20/2022 Elsevier Patient Education  2024 [employer].

## 2024-12-01 ENCOUNTER — Encounter (HOSPITAL_COMMUNITY): Payer: Self-pay | Admitting: Certified Registered Nurse Anesthetist

## 2024-12-02 ENCOUNTER — Encounter: Admission: RE | Payer: Self-pay

## 2024-12-02 ENCOUNTER — Ambulatory Visit (HOSPITAL_COMMUNITY): Admission: RE | Admit: 2024-12-02 | Admitting: Surgery

## 2024-12-02 SURGERY — REPAIR, HERNIA, UMBILICAL, ROBOT-ASSISTED
Anesthesia: General

## 2024-12-02 NOTE — Progress Notes (Signed)
 Called alternate phone number, husband Toribio.  He answered and states he is very sick and he was supposed to be the patients driver.  Patient decided during the night, since her husband is so sick, to cancel the surgery for today and reschedule.  She will call the office when they open.   Notified the OR desk, Montie, that patient is not coming.  Montie states she will notify Dr. Evonnie.

## 2024-12-02 NOTE — Progress Notes (Signed)
 Patient has no arrived for surgery, called her and left a detailed voice message.

## 2024-12-09 ENCOUNTER — Other Ambulatory Visit (INDEPENDENT_AMBULATORY_CARE_PROVIDER_SITE_OTHER): Payer: Self-pay | Admitting: Otolaryngology

## 2024-12-14 ENCOUNTER — Telehealth (INDEPENDENT_AMBULATORY_CARE_PROVIDER_SITE_OTHER): Payer: Self-pay | Admitting: Gastroenterology

## 2024-12-14 ENCOUNTER — Encounter (INDEPENDENT_AMBULATORY_CARE_PROVIDER_SITE_OTHER): Payer: Self-pay

## 2024-12-14 ENCOUNTER — Encounter (INDEPENDENT_AMBULATORY_CARE_PROVIDER_SITE_OTHER): Payer: Self-pay | Admitting: Gastroenterology

## 2024-12-14 ENCOUNTER — Ambulatory Visit (INDEPENDENT_AMBULATORY_CARE_PROVIDER_SITE_OTHER): Admitting: Gastroenterology

## 2024-12-14 VITALS — BP 109/76 | HR 96 | Temp 97.4°F | Ht 65.0 in | Wt 157.3 lb

## 2024-12-14 DIAGNOSIS — D8940 Mast cell activation, unspecified: Secondary | ICD-10-CM

## 2024-12-14 DIAGNOSIS — R112 Nausea with vomiting, unspecified: Secondary | ICD-10-CM | POA: Diagnosis not present

## 2024-12-14 DIAGNOSIS — K219 Gastro-esophageal reflux disease without esophagitis: Secondary | ICD-10-CM | POA: Insufficient documentation

## 2024-12-14 MED ORDER — OMEPRAZOLE 40 MG PO CPDR: 40.0000 mg | DELAYED_RELEASE_CAPSULE | Freq: Every day | ORAL | 3 refills | Status: AC

## 2024-12-14 MED ORDER — SCOPOLAMINE 1 MG/3DAYS TD PT72: 1.0000 | MEDICATED_PATCH | TRANSDERMAL | 1 refills | Status: AC

## 2024-12-14 MED ORDER — PROMETHAZINE HCL 25 MG RE SUPP: 25.0000 mg | Freq: Four times a day (QID) | RECTAL | 0 refills | Status: AC | PRN

## 2024-12-14 NOTE — Telephone Encounter (Signed)
 Pt would like referral to neurologist for Botox. Pt was seeing someone in [place] but would like someone closer. Please advise. Thank you!

## 2024-12-14 NOTE — Telephone Encounter (Signed)
 She receives Botox for her migraines. I will let her know to discuss with PCP

## 2024-12-14 NOTE — Progress Notes (Signed)
 Sharon Rowland Sharon Rowland , M.D. Gastroenterology & Hepatology [facility] Gastroenterology [address] [place], [place] [address] Primary Care Physician: Myra Geni ORN, FNP [address] [place] [address]  Chief Complaint: Chronic nausea and intermittent vomiting  History of Present Illness: Sharon Rowland is a 38 y.o. female with medical history significant for  GERD, chronic abdominal pain, Ehlers Danlos syndrome, asthma, hypothyroidism, mast cell activation syndrome, POTS, lupus, Sjogren syndrome, presumed gastroparesis , chronic NSAID use presented for a follow up after recent hospitalization with nausea and vomiting  Patient was recently seen while admitted in the hospital.  She underwent upper endoscopy and received symptomatic treatment  Today patient reports she is back to her baseline.  Her diet consists usually only occasional vegetables occasionally will be checking.  Patient agrees her recent exacerbation of symptoms might have been due to Protonix  leading to MAST cell activation.  Reports she continues to use scopolamine  patch which is the only thing which keeps her chronic nausea under control.  Patient would have a bowel movement every 2 days and is trying to increase fluid intake.  Gets adequate fiber as her diet consists of mostly vegetables.  Previous history :  Patient reports that she has been using scopolamine  patches and Phenergan  suppositories as needed . Previously She could not tolerate Zofran  due to prolonged QTc and she has not tolerated in the past Reglan  or Compazine .   The patient has been followed by gastroenterology at [facility] and has previously seen Delmar Pike, NP. She reports undergoing EGD and colonoscopy within the past two years for evaluation of dysphagia, GERD, and an abnormal CT scan, though procedural reports are not available for review. Per patient report, findings were notable for gastritis. Available  records note a negative fecal calprotectin in July 2023 and a negative celiac panel, with low IgA but otherwise normal immunoglobulin levels. She was also evaluated by Dr. Gerlene Law during a hospitalization at [facility] in March 2025, at which time her symptoms were felt to be most consistent with Ehlers-Danlos syndrome (EDS) and postural orthostatic tachycardia syndrome (POTS). She was advised to perform a bowel cleanout with MiraLAX  colon prep and to initiate pyridostigmine 30 mg three times daily. Her surgical history is significant for prior cholecystectomy and salpingectomy. She reports undergoing recent surgeries and believes these have contributed to increased frequency and severity of her baseline gastrointestinal symptoms.  Last EGD:10/2024  FINDINGS: - Normal esophagus.  - Gastritis.  Biopsied.  - Normal examined duodenum.  Biopsied.   A. SMALL BOWEL, BIOPSY:  - Small bowel mucosa with no specific pathologic change.  - Negative for intraepithelial lymphocytosis or villous blunting.   B. GASTRIC, BIOPSY:  - Benign gastric mucosa with no specific pathologic change.  - No evidence of H. pylori on HE stain.   Last Colonoscopy:none  Past Medical History: Past Medical History:  Diagnosis Date   Allergic rhinitis    Anemia    Arthritis    Asthma    last used inhaler 1 month ago   Complication of anesthesia    if given fentanyl  has severe itching, rash and wheezing   Cubital tunnel syndrome on left 07/2023   Depression    Dysphagia    Dysrhythmia    SVT   Ehlers-Danlos syndrome    Female bladder prolapse    Gastritis    GERD (gastroesophageal reflux disease)    with pregnancy only   Hashimoto's thyroiditis    Hypothyroidism  Lupus    Mast cell activation syndrome    Migraines    Osteoarthritis    Pelvic pain in antepartum period in second trimester 01/17/2014   Pneumonia    POTS (postural orthostatic tachycardia syndrome)    Pregnant 08/01/2015   Psoriatic arthritis  (HCC)    PSVT (paroxysmal supraventricular tachycardia)    could not be confirmed with 2016 study   Rectocele    Sjogren's syndrome    Thoracic outlet syndrome    Thyroid  disease    Urethral prolapse    Uterine prolapse    Vaginal Pap smear, abnormal     Past Surgical History: Past Surgical History:  Procedure Laterality Date   ANKLE SURGERY Bilateral    2023 L ankle debridement and internal brace 2024 R ankle debridement and internal brace   ANTERIOR AND POSTERIOR VAGINAL REPAIR     ANTERIOR INTEROSSEOUS NERVE DECOMPRESSION Left 08/13/2023   Procedure: SUBCUTANEOUS ANTERIOR TRANSPOSITION OF ULNAR NERVE AT LEFT ELBOW;  Surgeon: Edie Norleen PARAS, MD;  Location: [facility] ORS;  Service: Orthopedics;  Laterality: Left;   BREAST REDUCTION SURGERY Bilateral 08/20/2022   Procedure: MAMMARY REDUCTION  (BREAST) with Liposuction;  Surgeon: Marene Sieving, MD;  Location: [facility] OR;  Service: Plastics;  Laterality: Bilateral;  3 hours   COLONOSCOPY WITH ESOPHAGOGASTRODUODENOSCOPY (EGD)  12/26/2022   CYSTOCELE REPAIR N/A 09/17/2017   Procedure: ANTERIOR REPAIR (CYSTOCELE);  Surgeon: Jayne Vonn DEL, MD;  Location: [facility] ORS;  Service: Gynecology;  Laterality: N/A;   ESOPHAGOGASTRODUODENOSCOPY N/A 11/20/2024   Procedure: EGD (ESOPHAGOGASTRODUODENOSCOPY);  Surgeon: Eartha Flavors, Sieving, MD;  Location: [facility]ITE;  Service: Gastroenterology;  Laterality: N/A;   KNEE SURGERY Left 11/2021   KNEE SURGERY Right 2023   LABIOPLASTY N/A 03/24/2024   Procedure: LABIAPLASTY, VULVA;  Surgeon: Jayne Vonn DEL, MD;  Location: [facility] OR[facility];  Service: Gynecology;  Laterality: N/A;   LIPOSUCTION  03/2022   PANNICULECTOMY N/A 04/28/2023   Procedure: PANNICULECTOMY;  Surgeon: Waddell Leonce NOVAK, MD;  Location[facility];  Service: Plastics;  Laterality: N/A;   SALPINGO-OOPHORECTOMY, BILATERAL, ROBOT ASSISTED, LAPAROSCOPIC Bilateral 03/24/2024   Procedure: SALPINGO-OOPHORECTOMY, BILATERAL, ROBOT ASSISTED, LAPAROSCOPIC, D5;  Surgeon:  Jayne Vonn DEL, MD;  Location: [facility];  Service: Gynecology;  Laterality: Bilateral;   SUPRAVENTRICULAR TACHYCARDIA ABLATION  01/05/2015   unsuccessful   SUPRAVENTRICULAR TACHYCARDIA ABLATION N/A 01/05/2015   Procedure: SUPRAVENTRICULAR TACHYCARDIA ABLATION;  Surgeon: Danelle LELON Waddell, MD;  Location: [facility] CATH LAB;  Service: Cardiovascular;  Laterality: N/A;   Thoracic outlet decompression  2024   VAGINAL HYSTERECTOMY N/A 09/17/2017   Procedure: HYSTERECTOMY VAGINAL WITH POSSIBLE BILATERAL SALPINGECTOMY;  Surgeon: Jayne Vonn DEL, MD;  Location: [facility];  Service: Gynecology;  Laterality: N/A;   WRIST GANGLION EXCISION Left    x2    Family History: Family History  Problem Relation Age of Onset   Hypertension Mother    Hyperlipidemia Mother    Multiple sclerosis Mother    COPD Mother    Cancer Mother        melanoma x 2   Diabetes Mother    Heart disease Father        PSVT   Hyperlipidemia Brother    Hypertension Brother    Heart disease Maternal Grandfather        heart attack   Cancer Paternal Grandmother        pancreatic cancer   Heart disease Paternal Grandmother        MI   Hyperlipidemia Paternal Grandmother    Hypertension  Paternal Grandmother    Asthma Daughter    Asthma Son    Autism Son    ADD / ADHD Son    Tourette syndrome Son    Other Son        Neurological and mental issues   ADD / ADHD Son     Social History:Tobacco Use History[1] Social History   Substance and Sexual Activity  Alcohol Use No   Social History   Substance and Sexual Activity  Drug Use No    Allergies: Allergies[2]  Medications: Current Outpatient Medications  Medication Sig Dispense Refill   albuterol  (VENTOLIN  HFA) 108 (90 Base) MCG/ACT inhaler Inhale 1-2 puffs into the lungs every 6 (six) hours as needed for wheezing or shortness of breath.     Atogepant  (QULIPTA ) 60 MG TABS Take 60 mg by mouth daily.     betamethasone dipropionate 0.05 % cream Apply 1 Application topically 2  (two) times daily as needed (psoriasis).     botulinum toxin Type A (BOTOX) 200 units injection Inject 200 Units as directed every 3 (three) months.     buPROPion  (WELLBUTRIN  XL) 300 MG 24 hr tablet Take 1 tablet by mouth daily. 90 tablet 2   calcium  carbonate (TUMS EX) 750 MG chewable tablet Chew 1 tablet by mouth as needed for heartburn.     clobetasol cream (TEMOVATE) 0.05 % Apply 1 Application topically daily as needed (psoriasis).     desonide (DESOWEN) 0.05 % ointment Apply 1 application  topically 2 (two) times daily as needed (psoriasis).     estradiol  (VIVELLE -DOT) 0.1 MG/24HR patch Place 1 patch (0.1 mg total) onto the skin 2 (two) times a week. 8 patch 12   FIBER PO Take 20 mg by mouth daily.     FISH OIL-VITAMIN D PO Take by mouth. 1280 omega 02-999 D3     levothyroxine  (SYNTHROID ) 88 MCG tablet Take 88 mcg by mouth daily before breakfast.     methylphenidate  10 MG ER tablet TAKE ONE TABLET BY MOUTH IN THE MORNING 30 tablet 0   Multiple Vitamin (MULTIVITAMIN WITH MINERALS) TABS tablet Take 1 tablet by mouth daily.     nystatin cream (MYCOSTATIN) Apply 1 Application topically 2 (two) times daily as needed for dry skin (yeast).     pregabalin  (LYRICA ) 100 MG capsule Take 1 capsule (100 mg total) by mouth 2 (two) times daily. 180 capsule 3   PREMARIN  vaginal cream Insert 1 gram vaginally nightly. 90 g 1   promethazine  (PHENERGAN ) 25 MG suppository Place 1 suppository (25 mg total) rectally every 6 (six) hours as needed for nausea or vomiting. 12 each 0   promethazine  (PHENERGAN ) 25 MG tablet Take 1 tablet (25 mg total) by mouth every 6 (six) hours as needed for nausea or vomiting. 20 tablet 0   scopolamine  (TRANSDERM-SCOP) 1 MG/3DAYS Place 1 patch (1.5 mg total) onto the skin every 3 (three) days. 10 patch 1   tretinoin (RETIN-A) 0.025 % cream Apply topically at bedtime.     VTAMA 1 % CREA Apply topically.     Guselkumab (TREMFYA) 100 MG/ML SOPN Inject 1 Dose into the skin every 30  (thirty) days. (Patient not taking: Reported on 12/14/2024)     No current facility-administered medications for this visit.    Review of Systems: GENERAL: negative for malaise, night sweats HEENT: No changes in hearing or vision, no nose bleeds or other nasal problems. NECK: Negative for lumps, goiter, pain and significant neck swelling RESPIRATORY: Negative for cough,  wheezing CARDIOVASCULAR: Negative for chest pain, leg swelling, palpitations, orthopnea GI: SEE HPI MUSCULOSKELETAL: Negative for joint pain or swelling, back pain, and muscle pain. SKIN: Negative for lesions, rash HEMATOLOGY Negative for prolonged bleeding, bruising easily, and swollen nodes. ENDOCRINE: Negative for cold or heat intolerance, polyuria, polydipsia and goiter. NEURO: negative for tremor, gait imbalance, syncope and seizures. The remainder of the review of systems is noncontributory.   Physical Exam: BP 109/76   Pulse 96   Temp (!) 97.4 F (36.3 C)   Ht 5' 5 (1.651 m)   Wt 157 lb 4.8 oz (71.4 kg)   LMP 10/23/2016   BMI 26.18 kg/m  GENERAL: The patient is AO x3, in no acute distress. HEENT: Head is normocephalic and atraumatic. EOMI are intact. Mouth is well hydrated and without lesions. NECK: Supple. No masses LUNGS: Clear to auscultation. No presence of rhonchi/wheezing/rales. Adequate chest expansion HEART: RRR, normal s1 and s2. ABDOMEN: Soft, nontender, no guarding, no peritoneal signs, and nondistended. BS +. No masses.   Imaging/Labs: as above     Latest Ref Rng & Units 11/20/2024    6:58 AM 11/17/2024    5:40 AM 11/16/2024    7:50 PM  CBC  WBC 4.0 - 10.5 K/uL 6.6  6.3  8.5   Hemoglobin 12.0 - 15.0 g/dL 88.7  88.5  86.9   Hematocrit 36.0 - 46.0 % 34.5  35.3  38.7   Platelets 150 - 400 K/uL 271  273  323    No results found for: IRON, TIBC, FERRITIN  I personally reviewed and interpreted the available labs, imaging and endoscopic files.  Ct abdomen and pelvis     IMPRESSION: 1. Liquid stool in the colon, possibly indicating viral enterocolitis. 2. No evidence of obstruction.  Impression and Plan:  Sharon Rowland is a 38 y.o. female with medical history significant for  GERD, chronic abdominal pain, Ehlers Danlos syndrome, asthma, hypothyroidism, mast cell activation syndrome, POTS, lupus, Sjogren syndrome, presumed gastroparesis , chronic NSAID use presented for a follow up after recent hospitalization with nausea and vomiting  #Nausea/Vomiting   Patient has a complex history and symptoms could be multifactorial  Fortunately patient is back to baseline and symptoms are well-controlled with omeprazole  and scopolamine  patch  Patient was not able to tolerate Protonix  should be avoided in future  Recent upper endoscopy with biopsies negative   It is important to consider that EDS/POTS/mast cell disorder could be having a role in her recurrent symptoms  Patient never having formally evaluated for gastroparesis and I discussed with her testing for 4-hour gastric emptying study which she would like to defer as she believes would not change the management and she would like to avoid systemic medications. I discussed with her that if she indeed has gastroparesis, there are some potential options different from Reglan  for treatment but this (domperidone, prucalopride)   Recs:  -Continue with scopolamine  patches Scopolamine  transdermal patch is being continued for management of refractory nausea, with counseling provided regarding anticholinergic side effects and the need for caution given cumulative anticholinergic burden.  - Phenergan  12.5 mg every 8 hours PRN for refractory nausea - Can try using doxylamine  and piridoxine as needed if refractory nausea - omeprazole  40 mg daily (Avoid protonix )  - Try to avoid NSAIDs as much as possible -Lab work with cortisol, alpha gal and celiac panel   All questions were answered.      Jsoeph Podesta Sharon  Lorie Cleckley, MD Gastroenterology and Hepatology [facility]  This chart has been completed using Chief Executive Officer, and while attempts have been made to ensure accuracy , certain words and phrases may not be transcribed as intended      [1]  Social History Tobacco Use  Smoking Status Never   Passive exposure: Never  Smokeless Tobacco Never  [2]  Allergies Allergen Reactions   Apremilast Dermatitis, Rash and Other (See Comments)    Mood changes Hallucination DMAR  Pt stated this med makes her depression worse, makes her angry.   Cosentyx [Secukinumab] Anaphylaxis   Fentanyl  Itching, Dermatitis, Rash and Other (See Comments)    Wheezing    Hydroxychloroquine Palpitations and Other (See Comments)    Prolonged QT Irregular heart rate   Metoclopramide  Other (See Comments)    DO NOT GIVE VIA IV (can take PO) Causes Tremors, Jerking, Tachycardia   Other Rash    Lidocaine  Patches Adhesive Burn Scar Skin    Prochlorperazine  Other (See Comments)    DO NOT GIVE VIA IV  (can take PO) Causes Tremors, Jerking, Tachycardia   Semaglutide Dermatitis   Silver  Hives, Dermatitis and Rash   Tape Hives, Swelling and Rash    Use paper tape Angioedema   Latex Dermatitis   Zofran  [Ondansetron  Hcl] Other (See Comments)    Prolonged QT   Ezetimibe Nausea And Vomiting and Nausea Only   Protonix  [Pantoprazole  Sodium] Rash

## 2024-12-14 NOTE — Telephone Encounter (Signed)
 Botox for what condition? I advice if its not Gi related to discuss with her PCP regarding this

## 2024-12-17 ENCOUNTER — Inpatient Hospital Stay: Admit: 2024-12-17 | Discharge: 2024-12-17 | Payer: BLUE CROSS/BLUE SHIELD

## 2024-12-18 ENCOUNTER — Other Ambulatory Visit: Payer: Self-pay | Admitting: Obstetrics & Gynecology

## 2024-12-27 ENCOUNTER — Ambulatory Visit: Admit: 2024-12-27 | Discharge: 2024-12-28 | Payer: BLUE CROSS/BLUE SHIELD

## 2024-12-27 DIAGNOSIS — G43119 Migraine with aura, intractable, without status migrainosus: Principal | ICD-10-CM

## 2025-01-05 ENCOUNTER — Ambulatory Visit (INDEPENDENT_AMBULATORY_CARE_PROVIDER_SITE_OTHER): Admitting: Otolaryngology

## 2025-01-06 ENCOUNTER — Ambulatory Visit: Admit: 2025-01-06 | Discharge: 2025-01-07 | Payer: BLUE CROSS/BLUE SHIELD

## 2025-01-06 DIAGNOSIS — G8929 Other chronic pain: Principal | ICD-10-CM

## 2025-01-06 DIAGNOSIS — M25561 Pain in right knee: Principal | ICD-10-CM

## 2025-01-11 DIAGNOSIS — M1711 Unilateral primary osteoarthritis, right knee: Principal | ICD-10-CM

## 2025-01-25 DIAGNOSIS — L409 Psoriasis, unspecified: Secondary | ICD-10-CM

## 2025-01-25 DIAGNOSIS — L578 Other skin changes due to chronic exposure to nonionizing radiation: Principal | ICD-10-CM

## 2025-01-25 MED ORDER — TRETINOIN 0.025 % TOPICAL CREAM
TOPICAL | 5 refills | 0.00000 days | Status: CP
Start: 2025-01-25 — End: ?
